# Patient Record
Sex: Male | Born: 1940 | Race: White | Hispanic: No | Marital: Married | Smoking: Former smoker
Health system: Southern US, Community
[De-identification: ages and names within clinical notes are randomized; demographics above are authoritative.]

## PROBLEM LIST (undated history)

## (undated) DIAGNOSIS — K635 Polyp of colon: Secondary | ICD-10-CM

## (undated) DIAGNOSIS — N189 Chronic kidney disease, unspecified: Secondary | ICD-10-CM

## (undated) DIAGNOSIS — N400 Enlarged prostate without lower urinary tract symptoms: Secondary | ICD-10-CM

## (undated) DIAGNOSIS — I2510 Atherosclerotic heart disease of native coronary artery without angina pectoris: Secondary | ICD-10-CM

## (undated) DIAGNOSIS — E785 Hyperlipidemia, unspecified: Secondary | ICD-10-CM

## (undated) DIAGNOSIS — I10 Essential (primary) hypertension: Secondary | ICD-10-CM

## (undated) DIAGNOSIS — T8859XA Other complications of anesthesia, initial encounter: Secondary | ICD-10-CM

## (undated) DIAGNOSIS — T4145XA Adverse effect of unspecified anesthetic, initial encounter: Secondary | ICD-10-CM

## (undated) HISTORY — DX: Hyperlipidemia, unspecified: E78.5

## (undated) HISTORY — DX: Essential (primary) hypertension: I10

## (undated) HISTORY — PX: OTHER SURGICAL HISTORY: SHX169

## (undated) HISTORY — PX: BACK SURGERY: SHX140

## (undated) HISTORY — PX: TONSILLECTOMY: SUR1361

## (undated) HISTORY — DX: Polyp of colon: K63.5

## (undated) HISTORY — PX: HEMORRHOID SURGERY: SHX153

---

## 1998-04-04 ENCOUNTER — Inpatient Hospital Stay (HOSPITAL_COMMUNITY): Admission: RE | Admit: 1998-04-04 | Discharge: 1998-04-06 | Payer: Self-pay | Admitting: Neurosurgery

## 1998-09-03 ENCOUNTER — Ambulatory Visit (HOSPITAL_COMMUNITY): Admission: RE | Admit: 1998-09-03 | Discharge: 1998-09-03 | Payer: Self-pay | Admitting: Neurosurgery

## 2000-01-26 ENCOUNTER — Observation Stay (HOSPITAL_COMMUNITY): Admission: RE | Admit: 2000-01-26 | Discharge: 2000-01-27 | Payer: Self-pay | Admitting: Neurosurgery

## 2000-02-17 ENCOUNTER — Encounter: Admission: RE | Admit: 2000-02-17 | Discharge: 2000-02-17 | Payer: Self-pay | Admitting: Internal Medicine

## 2001-09-05 ENCOUNTER — Encounter: Payer: Self-pay | Admitting: Internal Medicine

## 2001-09-05 ENCOUNTER — Ambulatory Visit (HOSPITAL_COMMUNITY): Admission: RE | Admit: 2001-09-05 | Discharge: 2001-09-05 | Payer: Self-pay | Admitting: Internal Medicine

## 2002-07-25 ENCOUNTER — Emergency Department (HOSPITAL_COMMUNITY): Admission: EM | Admit: 2002-07-25 | Discharge: 2002-07-25 | Payer: Self-pay | Admitting: *Deleted

## 2002-08-04 ENCOUNTER — Ambulatory Visit (HOSPITAL_COMMUNITY): Admission: RE | Admit: 2002-08-04 | Discharge: 2002-08-04 | Payer: Self-pay | Admitting: Internal Medicine

## 2002-08-04 ENCOUNTER — Encounter: Payer: Self-pay | Admitting: Internal Medicine

## 2002-10-19 ENCOUNTER — Observation Stay (HOSPITAL_COMMUNITY): Admission: RE | Admit: 2002-10-19 | Discharge: 2002-10-20 | Payer: Self-pay | Admitting: Neurosurgery

## 2003-05-05 IMAGING — CT CT HEAD W/O CM
1 series · 16 of 28 positions shown, 20 images · non-contrast
Comparison: none

FINDINGS
CLINICAL DATA: HEADACHE; CONCUSSION
CT OF THE HEAD WITHOUT CONTRAST
COMPARING 03/12/01.
CONTIGUOUS AXIAL CT IMAGES WERE OBTAINED FROM THE SKULL BASE TO THE VERTEX.
NO INTRACRANIAL HEMORRHAGE OR ACUTE INTRACRANIAL ABNORMALITY IS IDENTIFIED.  NO HYDROCEPHALUS IS
NOTED.  NO ABNORMAL EXTRAAXIAL FLUID COLLECTION OR MASS EFFECT IS IDENTIFIED.
IMPRESSION
UNREMARKABLE CT APPEARANCE OF THE HEAD.

[Series 880: — · axial · 0.42mm/px · z∈[-620,-495]mm · 16 of 28 slices shown, 20 images]
[im 2/28  brain]
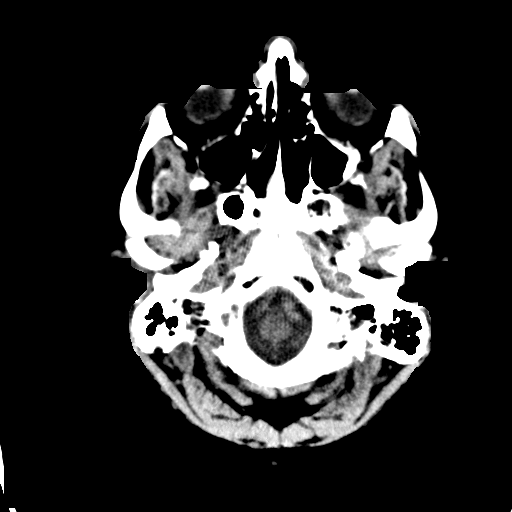
[im 2/28  bone]
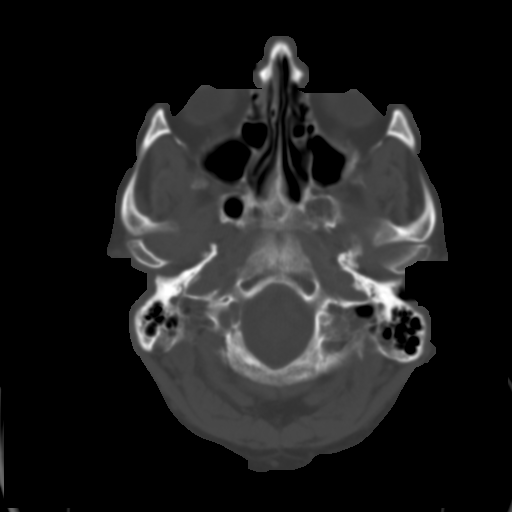
[im 4/28  brain]
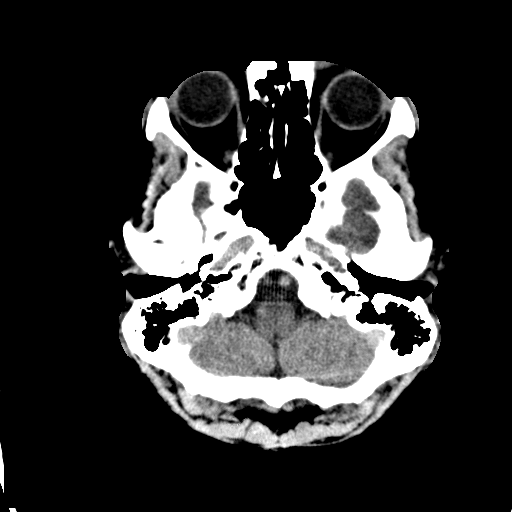
[im 6/28  brain]
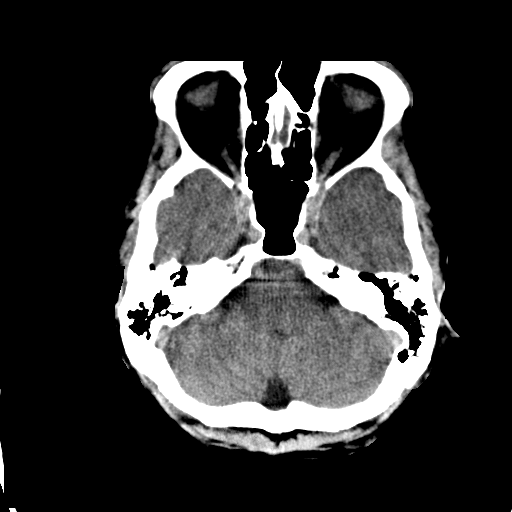
[im 7/28  brain]
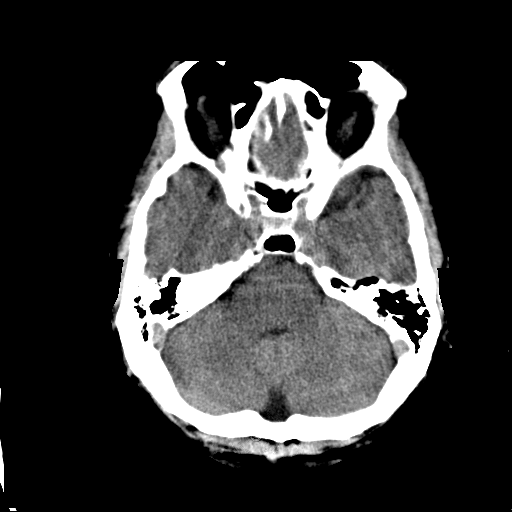
[im 9/28  brain]
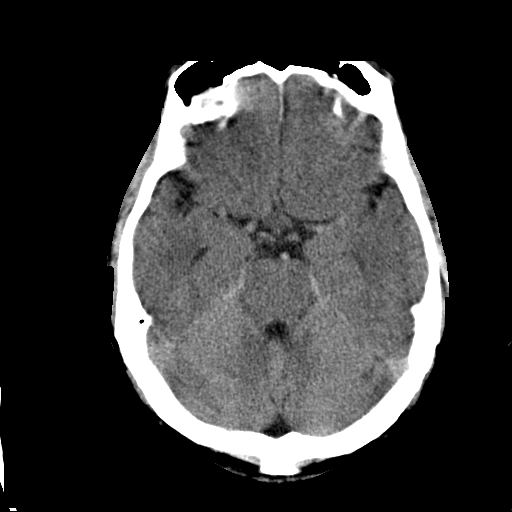
[im 9/28  bone]
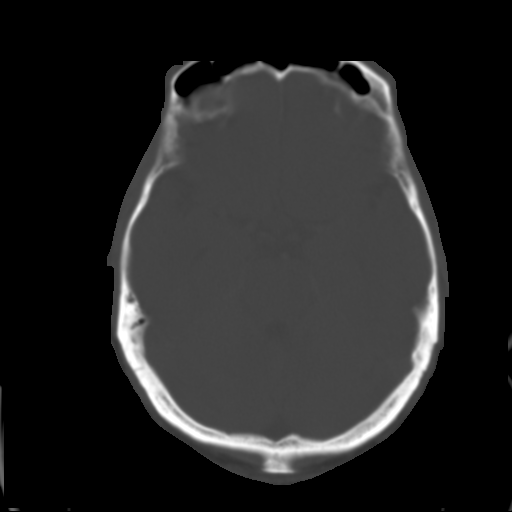
[im 10/28  brain]
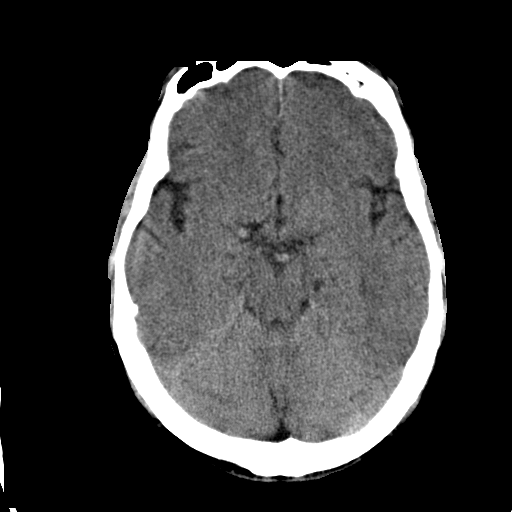
[im 12/28  brain]
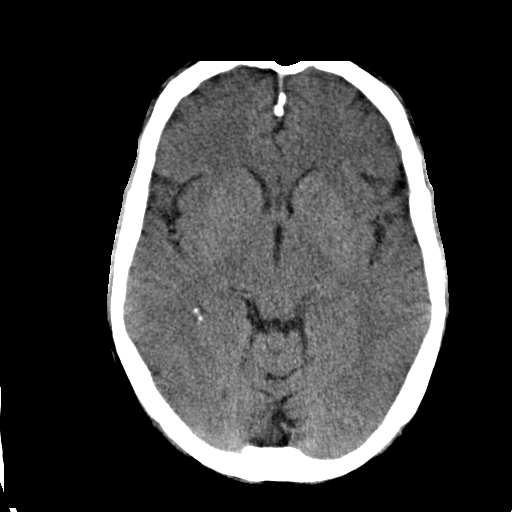
[im 14/28  brain]
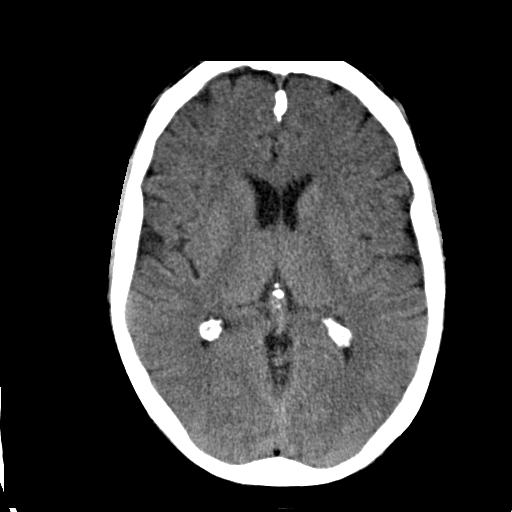
[im 15/28  brain]
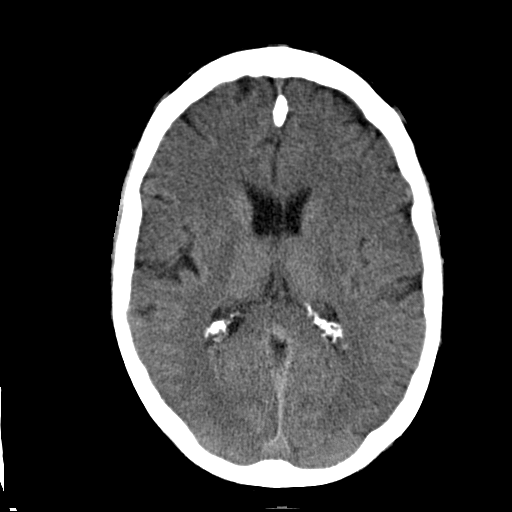
[im 15/28  bone]
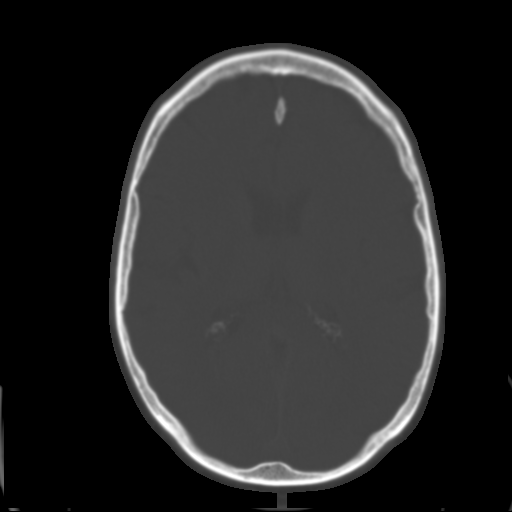
[im 17/28  brain]
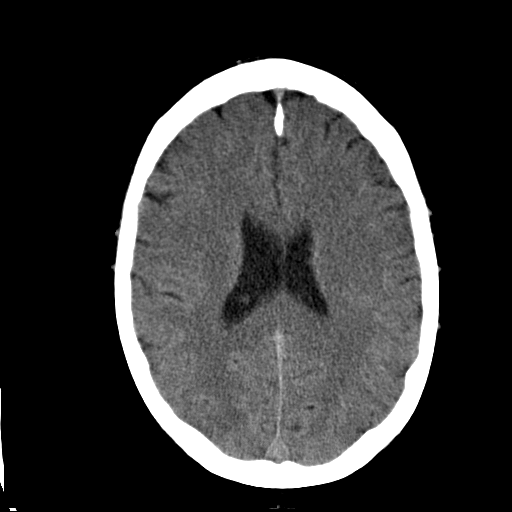
[im 19/28  brain]
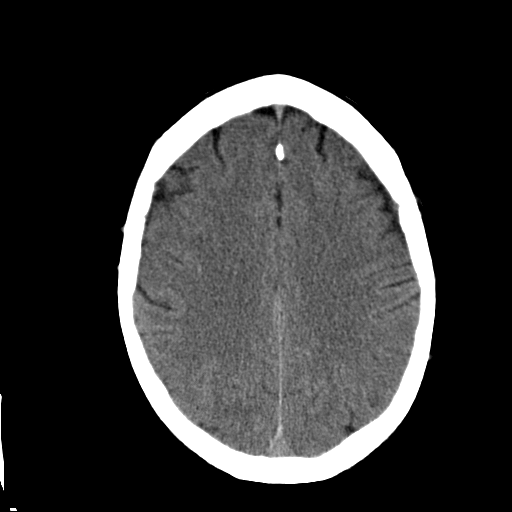
[im 20/28  brain]
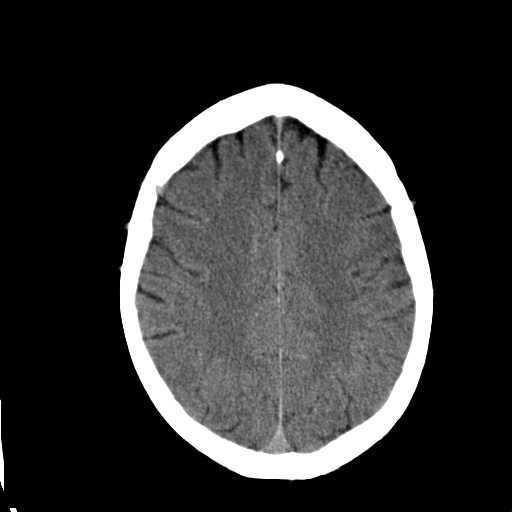
[im 22/28  brain]
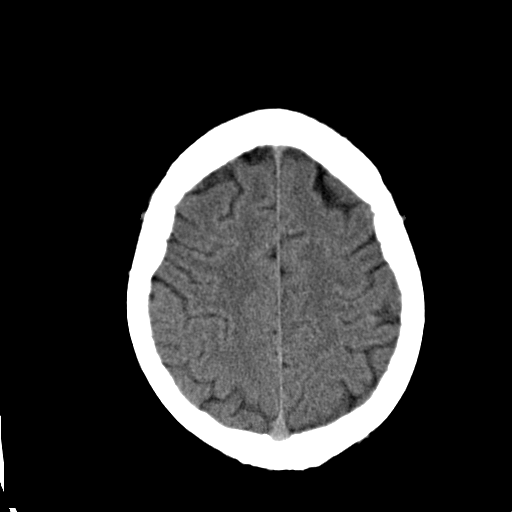
[im 22/28  bone]
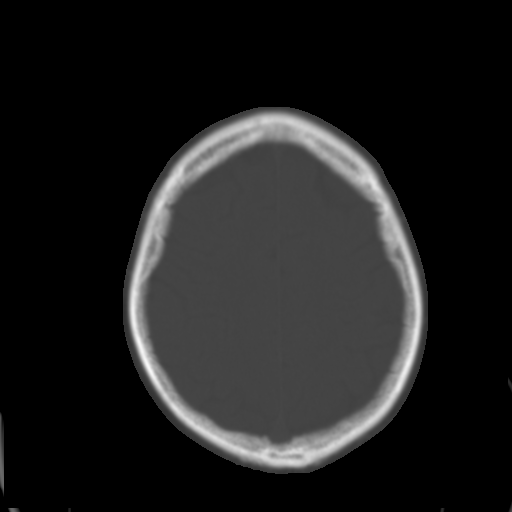
[im 23/28  brain]
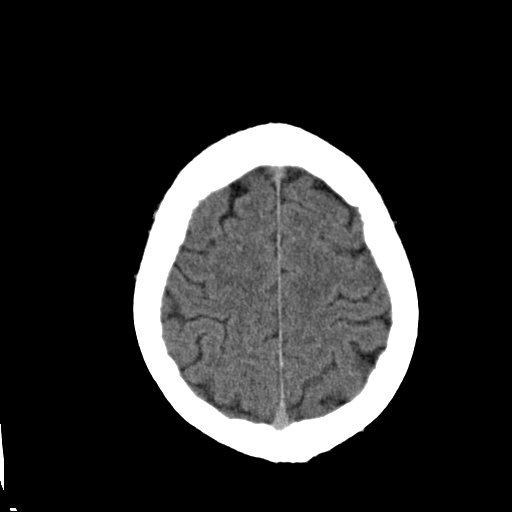
[im 25/28  brain]
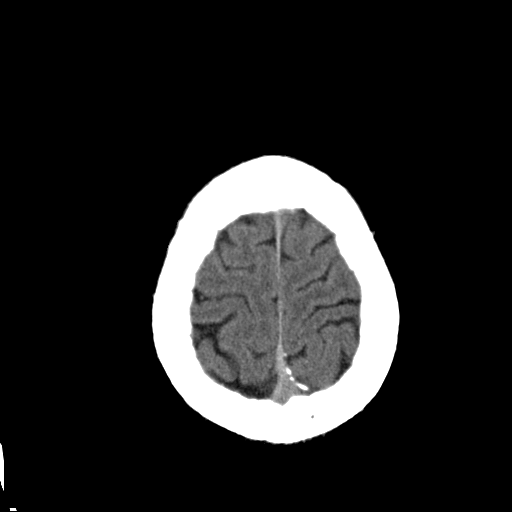
[im 27/28  brain]
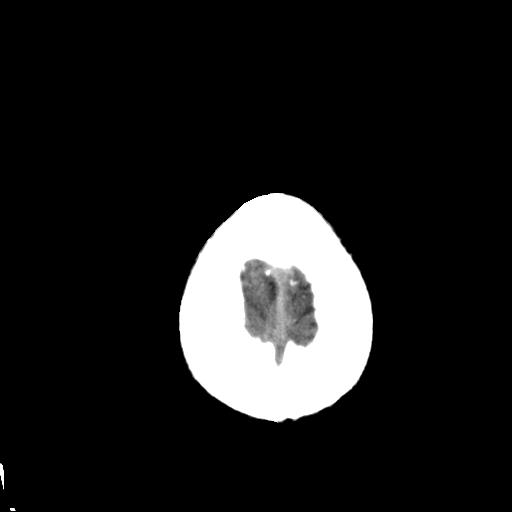

[16 of 28 positions shown; findings below may reference images not displayed]

## 2003-12-29 HISTORY — PX: CORONARY STENT PLACEMENT: SHX1402

## 2003-12-29 HISTORY — PX: CARDIAC CATHETERIZATION: SHX172

## 2004-04-25 ENCOUNTER — Ambulatory Visit (HOSPITAL_COMMUNITY): Admission: RE | Admit: 2004-04-25 | Discharge: 2004-04-25 | Payer: Self-pay | Admitting: Internal Medicine

## 2004-09-02 ENCOUNTER — Inpatient Hospital Stay (HOSPITAL_COMMUNITY): Admission: EM | Admit: 2004-09-02 | Discharge: 2004-09-04 | Payer: Self-pay | Admitting: Emergency Medicine

## 2005-11-06 ENCOUNTER — Ambulatory Visit (HOSPITAL_COMMUNITY): Admission: RE | Admit: 2005-11-06 | Discharge: 2005-11-06 | Payer: Self-pay | Admitting: Neurosurgery

## 2006-01-11 ENCOUNTER — Inpatient Hospital Stay (HOSPITAL_COMMUNITY): Admission: RE | Admit: 2006-01-11 | Discharge: 2006-01-13 | Payer: Self-pay | Admitting: Neurosurgery

## 2007-01-29 ENCOUNTER — Emergency Department (HOSPITAL_COMMUNITY): Admission: EM | Admit: 2007-01-29 | Discharge: 2007-01-29 | Payer: Self-pay | Admitting: Emergency Medicine

## 2008-06-13 ENCOUNTER — Emergency Department (HOSPITAL_COMMUNITY): Admission: EM | Admit: 2008-06-13 | Discharge: 2008-06-13 | Payer: Self-pay | Admitting: Emergency Medicine

## 2008-06-25 ENCOUNTER — Ambulatory Visit: Payer: Self-pay | Admitting: Orthopedic Surgery

## 2008-06-25 DIAGNOSIS — M545 Low back pain, unspecified: Secondary | ICD-10-CM | POA: Insufficient documentation

## 2008-06-25 DIAGNOSIS — M5380 Other specified dorsopathies, site unspecified: Secondary | ICD-10-CM | POA: Insufficient documentation

## 2008-06-25 DIAGNOSIS — M5430 Sciatica, unspecified side: Secondary | ICD-10-CM

## 2008-06-25 DIAGNOSIS — M5126 Other intervertebral disc displacement, lumbar region: Secondary | ICD-10-CM

## 2008-06-26 ENCOUNTER — Telehealth: Payer: Self-pay | Admitting: Orthopedic Surgery

## 2008-07-06 ENCOUNTER — Encounter: Payer: Self-pay | Admitting: Orthopedic Surgery

## 2008-07-09 ENCOUNTER — Telehealth: Payer: Self-pay | Admitting: Orthopedic Surgery

## 2008-07-10 ENCOUNTER — Telehealth: Payer: Self-pay | Admitting: Orthopedic Surgery

## 2008-07-11 ENCOUNTER — Encounter: Payer: Self-pay | Admitting: Orthopedic Surgery

## 2008-07-16 ENCOUNTER — Ambulatory Visit: Payer: Self-pay | Admitting: Orthopedic Surgery

## 2008-07-16 ENCOUNTER — Telehealth: Payer: Self-pay | Admitting: Orthopedic Surgery

## 2008-07-16 DIAGNOSIS — S22009A Unspecified fracture of unspecified thoracic vertebra, initial encounter for closed fracture: Secondary | ICD-10-CM | POA: Insufficient documentation

## 2008-07-17 ENCOUNTER — Encounter: Payer: Self-pay | Admitting: Orthopedic Surgery

## 2008-07-25 ENCOUNTER — Telehealth: Payer: Self-pay | Admitting: Orthopedic Surgery

## 2008-08-17 ENCOUNTER — Encounter: Payer: Self-pay | Admitting: Orthopedic Surgery

## 2008-08-30 ENCOUNTER — Encounter: Payer: Self-pay | Admitting: Orthopedic Surgery

## 2008-10-03 ENCOUNTER — Encounter: Payer: Self-pay | Admitting: Orthopedic Surgery

## 2008-10-17 ENCOUNTER — Ambulatory Visit (HOSPITAL_COMMUNITY): Admission: RE | Admit: 2008-10-17 | Discharge: 2008-10-17 | Payer: Self-pay | Admitting: Neurosurgery

## 2008-10-17 ENCOUNTER — Encounter (INDEPENDENT_AMBULATORY_CARE_PROVIDER_SITE_OTHER): Payer: Self-pay | Admitting: Neurosurgery

## 2009-01-30 ENCOUNTER — Ambulatory Visit (HOSPITAL_COMMUNITY): Admission: RE | Admit: 2009-01-30 | Discharge: 2009-01-31 | Payer: Self-pay | Admitting: Neurosurgery

## 2009-06-05 ENCOUNTER — Emergency Department (HOSPITAL_COMMUNITY): Admission: EM | Admit: 2009-06-05 | Discharge: 2009-06-05 | Payer: Self-pay | Admitting: Emergency Medicine

## 2009-07-18 IMAGING — RF DG THORACIC SPINE 2V
1 series · 3 of 3 positions shown · non-contrast
Comparison: None.

CLINICAL DATA: Back pain

THORACIC SPINE - 2 VIEW

[Series 1: run · 3 of 3 slices shown]
[im 1/3]
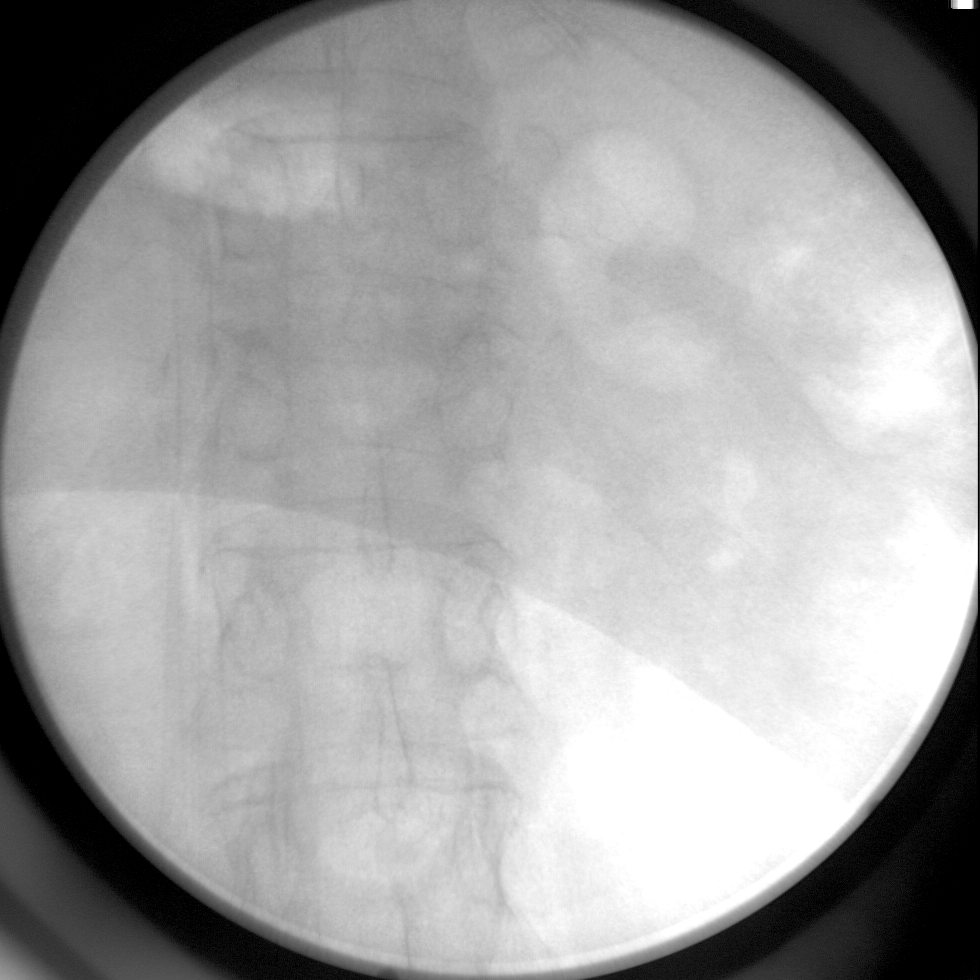
[im 2/3]
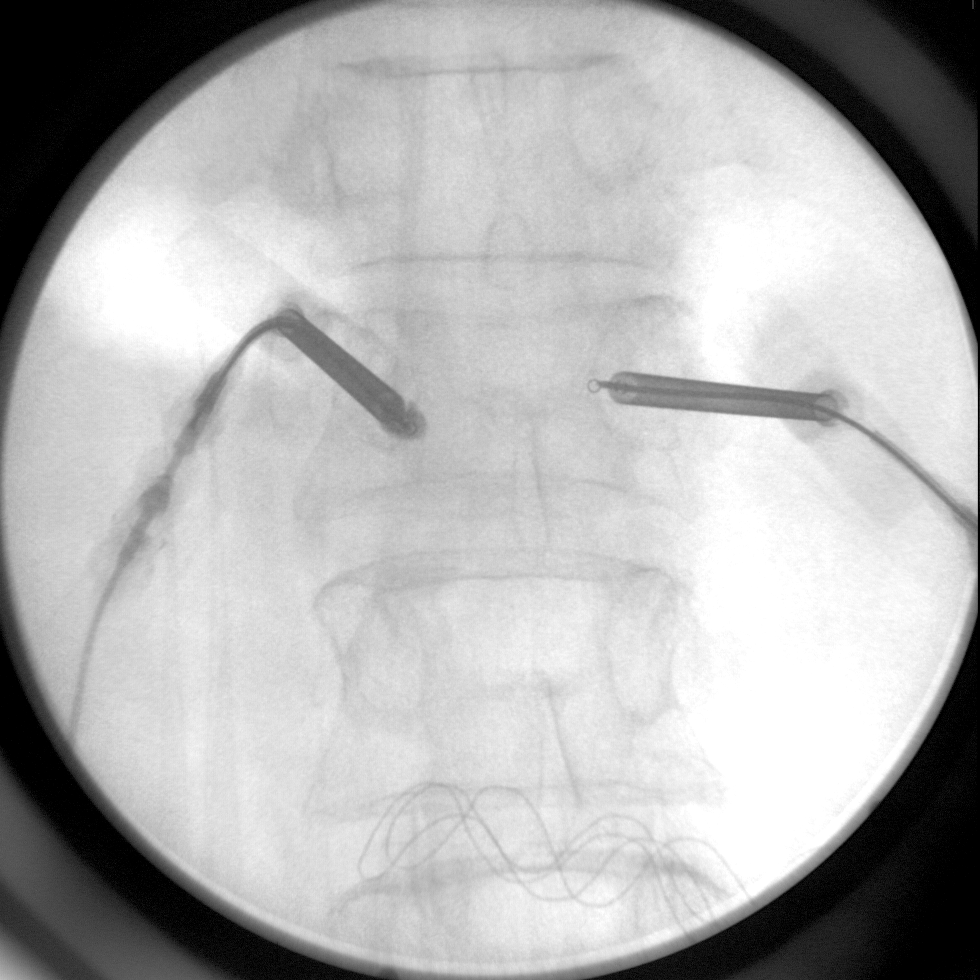
[im 3/3]
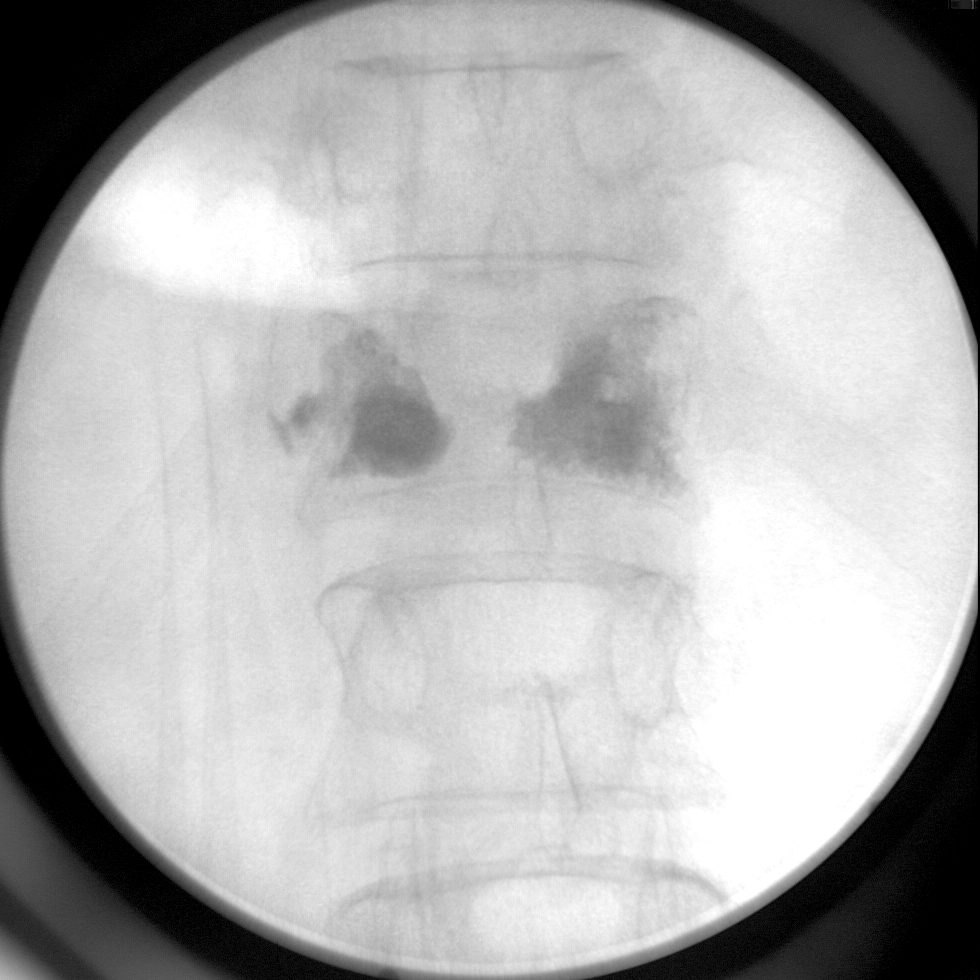

[3 of 3 positions shown; findings below may reference images not displayed]

FINDINGS: AP and lateral C-arm films demonstrate mild compression
of T12 vertebrae.
IMPRESSION: As above

## 2010-06-23 ENCOUNTER — Ambulatory Visit: Payer: Self-pay | Admitting: Urgent Care

## 2010-06-23 DIAGNOSIS — R110 Nausea: Secondary | ICD-10-CM

## 2010-06-23 DIAGNOSIS — R634 Abnormal weight loss: Secondary | ICD-10-CM

## 2010-06-23 DIAGNOSIS — K219 Gastro-esophageal reflux disease without esophagitis: Secondary | ICD-10-CM | POA: Insufficient documentation

## 2010-06-24 ENCOUNTER — Encounter: Payer: Self-pay | Admitting: Gastroenterology

## 2010-07-03 ENCOUNTER — Encounter: Payer: Self-pay | Admitting: Internal Medicine

## 2010-11-04 ENCOUNTER — Ambulatory Visit: Payer: Self-pay | Admitting: Internal Medicine

## 2010-11-27 ENCOUNTER — Ambulatory Visit: Payer: Self-pay | Admitting: Internal Medicine

## 2010-11-27 ENCOUNTER — Ambulatory Visit (HOSPITAL_COMMUNITY)
Admission: RE | Admit: 2010-11-27 | Discharge: 2010-11-27 | Payer: Self-pay | Source: Home / Self Care | Admitting: Internal Medicine

## 2010-12-05 ENCOUNTER — Ambulatory Visit (HOSPITAL_COMMUNITY)
Admission: RE | Admit: 2010-12-05 | Discharge: 2010-12-05 | Payer: Self-pay | Source: Home / Self Care | Attending: Internal Medicine | Admitting: Internal Medicine

## 2010-12-18 ENCOUNTER — Ambulatory Visit (HOSPITAL_COMMUNITY)
Admission: RE | Admit: 2010-12-18 | Discharge: 2010-12-18 | Payer: Self-pay | Source: Home / Self Care | Attending: Internal Medicine | Admitting: Internal Medicine

## 2011-01-25 LAB — CONVERTED CEMR LAB
BUN: 14 mg/dL (ref 6–23)
Creatinine, Ser: 0.97 mg/dL (ref 0.40–1.50)

## 2011-01-29 NOTE — Letter (Signed)
Summary: Internal Other/EGD/TCS ORDER  Internal Other/EGD/TCS ORDER   Imported By: Cloria Spring LPN 56/21/3086 57:84:69  _____________________________________________________________________  External Attachment:    Type:   Image     Comment:   External Document

## 2011-01-29 NOTE — Letter (Signed)
Summary: referral from dr fusco  referral from dr fusco   Imported By: Rosine Beat 07/03/2010 14:49:38  _____________________________________________________________________  External Attachment:    Type:   Image     Comment:   External Document

## 2011-01-29 NOTE — Assessment & Plan Note (Signed)
Summary: abd discomfort,nausea,consult for tcs/ss   Referring Provider:  Dr Sherwood Gambler Primary Care Provider:  Dr Sherwood Gambler  Chief Complaint:  not digesting certain foods and N/V.  History of Present Illness: 70 y/o caucasian male w/ chronic nausea and vomiting x 5 months.  c/o "wanting to belch".   Was taking TUMS round the clock.   Taking OTC prilosec 20mg  two times a day x 5 months.  This has hellped some.  Can't eat meats or spicy foods.  Heartburn & indigestion resolved w prilosec.  Denies odynophagia and dysphagia.  Weight loss 30# in 5 months.  ASA 81mg  daily & plavix daily.  Percocet as needed chronic leg/elbow pain neuropathy.  BM 2-3/day, now w/ once q couple of days,  denies rectal bleeding or melena.  "Not eating due to fear of feeling sick."  On Plavix and ASA daily w/ hx heart stent.  Current Problems (verified): 1)  Gerd  (ICD-530.81) 2)  Nausea, Chronic  (ICD-787.02) 3)  Vertebral Fracture, Thoracic Spine  (ICD-805.2) 4)  H N P-lumbar  (ICD-722.10) 5)  Sciatica  (ICD-724.3) 6)  Lumbar Spasm  (ICD-724.8) 7)  Low Back Pain  (ICD-724.2) 8)  Family History Coronary Heart Disease Male < 65  (ICD-V17.3) 9)  Family History of Diabetes  (ICD-V18.0)  Current Medications (verified): 1)  Baby Aspirin 81 Mg  Chew (Aspirin) 2)  Plavix 75 Mg  Tabs (Clopidogrel Bisulfate) 3)  Percocet 10/325 Mg  Tabs (Oxycodone-Acetaminophen) .Marland Kitchen.. 1 By Mouth Q 4 Hrs 4)  Prilosec Otc 20 Mg Tbec (Omeprazole Magnesium) .... Two Times A Day  Allergies (verified): 1)  ! Paxil  Past History:  Past Medical History: stent for heart CAD neuropathy colonoscopy 1993-normal GERD  Past Surgical History: 2 ACDF (neck) hemorrhoidectomy 2 Lspine procedures 6 back surgeries tonsillectomy right ear surgery  Family History: Family History of Diabetes Family History Coronary Heart Disease male < 65 No known family history of colorectal carcinoma, IBD, liver or chronic GI problems.  Social  History: Patient is married. 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 12 PKYR Alcohol Use - yes, shot tequila once yr Illicit Drug Use - no Patient gets regular exercise. walks Does Patient Exercise:  yes Smoking Status:  quit Drug Use:  no  Review of Systems General:  Denies fever, chills, sweats, anorexia, fatigue, weakness, malaise, and sleep disorder. CV:  Denies chest pains, angina, palpitations, syncope, dyspnea on exertion, orthopnea, PND, peripheral edema, and claudication. Resp:  Denies dyspnea at rest, dyspnea with exercise, cough, sputum, wheezing, coughing up blood, and pleurisy. GI:  Denies jaundice and fecal incontinence. GU:  Denies urinary burning, blood in urine, urinary frequency, urinary hesitancy, nocturnal urination, and urinary incontinence. MS:  Complains of leg pain at night and leg pain with exertion; denies joint pain / LOM, joint swelling, joint stiffness, joint deformity, low back pain, muscle weakness, muscle cramps, muscle atrophy, and shoulder pain / LOM hand / wrist pain (CTS). Derm:  Denies rash, itching, dry skin, hives, moles, warts, and unhealing ulcers. Psych:  Denies depression, anxiety, memory loss, suicidal ideation, hallucinations, paranoia, phobia, and confusion. Heme:  Complains of bruising; denies bleeding and enlarged lymph nodes.  Vital Signs:  Patient profile:   70 year old male Height:      74 inches Weight:      192 pounds BMI:     24.74 Temp:     97.8 degrees F oral Pulse rate:   60 / minute BP sitting:  112 / 72  (left arm) Cuff size:   regular  Vitals Entered By: Hendricks Limes LPN (June 23, 2010 2:59 PM)  Physical Exam  General:  Well developed, well nourished, no acute distress. Head:  Normocephalic and atraumatic. Eyes:  Sclera clear, no icterus. Ears:  Normal auditory acuity. Nose:  No deformity, discharge,  or lesions. Mouth:  No deformity or lesions, dentition normal. Neck:   Supple; no masses or thyromegaly. Lungs:  Clear throughout to auscultation. Heart:  Regular rate and rhythm; no murmurs, rubs,  or bruits. Abdomen:  Soft, nontender and nondistended. No masses, hepatosplenomegaly or hernias noted. Normal bowel sounds.without guarding and without rebound.   Msk:  Symmetrical with no gross deformities. Normal posture. Pulses:  Normal pulses noted. Extremities:  No clubbing, cyanosis, edema or deformities noted. Neurologic:  Alert and  oriented x4;  grossly normal neurologically. Skin:  Intact without significant lesions or rashes. Cervical Nodes:  No significant cervical adenopathy. Psych:  Alert and cooperative. Normal mood and affect.  Impression & Recommendations:  Problem # 1:  NAUSEA, CHRONIC (ICD-787.02) 70 y/o caucasian male w/ 5 month hx significant weight loss, chronic nausea, & GERD.  Pt on plavix/ASA w/ hx CAD s/p cardiac stent.  Differentials include PUD, occult malignancy, gastroparesis, refractory GERD, or gastritis.  Diagnostic EGD and colonoscopy to be performed by Dr. Jonette Eva in the near future.  I have discussed risks and benefits which include, but are not limited to, bleeding, infection, perforation, or medication reaction.  The patient agrees with this plan and consent will be obtained.  Will discuss plavix/ASA mgmt prior to procedure w/ Dr Darrick Penna.  Orders: Consultation Level III (66440)  Problem # 2:  WEIGHT LOSS, ABNORMAL (ICD-783.21) See #1  Problem # 3:  GERD (ICD-530.81) See #1  Appended Document: abd discomfort,nausea,consult for tcs/ss Stent placed by Dr Allyson Sabal approx 6 yrs ago   Appended Document: abd discomfort,nausea,consult for tcs/ss Hold Plavix for 5 days pirio to procedure. Continue ASA.  Appended Document: abd discomfort,nausea,consult for tcs/ss Pt informed.  Appended Document: abd discomfort,nausea,consult for tcs/ss Pt's wife called to cancell procedure. Pt will [facility] when he gets back in town. Pt has 2  MRN.

## 2011-02-27 ENCOUNTER — Other Ambulatory Visit (HOSPITAL_COMMUNITY): Payer: Self-pay | Admitting: Family Medicine

## 2011-02-27 DIAGNOSIS — R1110 Vomiting, unspecified: Secondary | ICD-10-CM

## 2011-02-27 DIAGNOSIS — R1011 Right upper quadrant pain: Secondary | ICD-10-CM

## 2011-03-04 ENCOUNTER — Encounter (HOSPITAL_COMMUNITY): Payer: Self-pay

## 2011-03-04 ENCOUNTER — Encounter (HOSPITAL_COMMUNITY)
Admission: RE | Admit: 2011-03-04 | Discharge: 2011-03-04 | Disposition: A | Discharge status: Home / Self Care | Payer: Medicare Other | Source: Ambulatory Visit | Attending: Family Medicine | Admitting: Family Medicine

## 2011-03-04 DIAGNOSIS — R1011 Right upper quadrant pain: Secondary | ICD-10-CM | POA: Insufficient documentation

## 2011-03-04 DIAGNOSIS — R112 Nausea with vomiting, unspecified: Secondary | ICD-10-CM | POA: Insufficient documentation

## 2011-03-04 DIAGNOSIS — R1110 Vomiting, unspecified: Secondary | ICD-10-CM

## 2011-03-04 MED ORDER — TECHNETIUM TC 99M MEBROFENIN IV KIT
5.0000 | PACK | Freq: Once | INTRAVENOUS | Status: AC | PRN
  Administered 2011-03-04: 5.5 via INTRAVENOUS

## 2011-03-10 LAB — IRON AND TIBC
Iron: 54 ug/dL (ref 42–135)
Saturation Ratios: 20 % (ref 20–55)
TIBC: 266 ug/dL (ref 215–435)
UIBC: 212 ug/dL

## 2011-03-10 LAB — FOLATE RBC: RBC Folate: 875 ng/mL — ABNORMAL HIGH (ref 180–600)

## 2011-03-10 LAB — CBC
HCT: 34.8 % — ABNORMAL LOW (ref 39.0–52.0)
Hemoglobin: 11.9 g/dL — ABNORMAL LOW (ref 13.0–17.0)
MCH: 30.7 pg (ref 26.0–34.0)
MCHC: 34.2 g/dL (ref 30.0–36.0)
MCV: 89.7 fL (ref 78.0–100.0)
Platelets: 121 10*3/uL — ABNORMAL LOW (ref 150–400)
RBC: 3.88 MIL/uL — ABNORMAL LOW (ref 4.22–5.81)
RDW: 14.2 % (ref 11.5–15.5)
WBC: 6 10*3/uL (ref 4.0–10.5)

## 2011-03-10 LAB — VITAMIN B12: Vitamin B-12: 172 pg/mL — ABNORMAL LOW (ref 211–911)

## 2011-03-10 LAB — FERRITIN: Ferritin: 226 ng/mL (ref 22–322)

## 2011-03-10 LAB — LIPID PANEL: Cholesterol: 186 mg/dL (ref 0–200)

## 2011-03-19 ENCOUNTER — Ambulatory Visit (INDEPENDENT_AMBULATORY_CARE_PROVIDER_SITE_OTHER): Payer: Self-pay | Admitting: Internal Medicine

## 2011-04-14 LAB — BASIC METABOLIC PANEL
CO2: 27 mEq/L (ref 19–32)
Glucose, Bld: 90 mg/dL (ref 70–99)
Potassium: 4.2 mEq/L (ref 3.5–5.1)
Sodium: 139 mEq/L (ref 135–145)

## 2011-04-14 LAB — CBC
HCT: 37.7 % — ABNORMAL LOW (ref 39.0–52.0)
Hemoglobin: 13.3 g/dL (ref 13.0–17.0)
MCHC: 35.3 g/dL (ref 30.0–36.0)
RDW: 13.9 % (ref 11.5–15.5)

## 2011-05-12 NOTE — Consult Note (Signed)
Travis Woodward, Travis Woodward                 ACCOUNT NO.:  [id number]   MEDICAL RECORD NO.:  [id number]          PATIENT TYPE:  OIB   LOCATION:  3533                         FACILITY:  [facility]   PHYSICIAN:  Mark C. Vernie Ammons, M.D.  DATE OF BIRTH:  1941-08-07   DATE OF CONSULTATION:  01/31/2009  DATE OF DISCHARGE:  01/31/2009                                 CONSULTATION   Travis Woodward is a very pleasant 70 year old white male patient seen in  hospital consultation for further evaluation of acute urinary retention  after spinal surgery.  The patient reports that he has a longstanding  history of enlarged prostate and has undergone 6 surgeries in the past.  The first surgery he had back in 1999 resulted in urinary retention and  this has occurred on another occasion previously.  He has been treated  with Flomax and the shorter surgeries did not often times cause urinary  retention, but if he had a longer surgery, this would tend to occur.  He  has been seen and evaluated in the past by Dr. Wanda Plump and apparently,  has undergone a prostate biopsy that was negative.  He reports Dr. Sherwood Gambler  has performed a recent VRE and PSA and found these to be normal.   He came into the hospital for an elective back surgery and was unable to  urinate after the surgery.  A Foley catheter was inserted and remains  indwelling at this time.  I was consulted regarding further treatment  and care.  He reports that he has really no voiding symptoms as a  baseline and specifically denies significant obstructive symptoms as  well as lack of nocturia.  He has no prior history of prostatitis.  His  urinary retention would be considered of moderate severity and there are  no modifying factors.   Past medical history is positive for:  1. Coronary artery disease.  2. BPH.  3. History of tobacco use.   SURGICAL HISTORY:  He has had a drug eluting stent placed in 2005 and  had several back surgeries and orthopedic procedures.   MEDICATIONS ON ADMISSION:  1. Plavix 75 mg.  2. Aspirin.  3. Prilosec.  4. Lexapro.  5. Percocet.   ALLERGIES:  He has an intolerance to STATINS.   SOCIAL HISTORY:  He currently does not smoke and denies drinking.   FAMILY HISTORY:  Negative for GU malignancy or renal disease.   REVIEW OF SYSTEMS:  Positive for back pain from his recent surgery and  has some slight discomfort from the Foley catheter, otherwise his 13-  point review of systems is negative.   PHYSICAL EXAMINATION:  VITAL SIGNS:  Temperature is 98.7, blood pressure  is 168/95, pulse 73, and respiration 18.  GENERAL:  The patient is well-developed and well-nourished white male,  in no apparent distress.  HEENT:  Atraumatic, normocephalic.  Oropharynx is clear.  NECK:  Supple with midline trachea.  CHEST:  Normal respiratory effort.  CARDIOVASCULAR:  Regular rate and rhythm.  ABDOMEN:  Soft and nontender without mass or hepatosplenomegaly.  He has  no inguinal  hernias or adenopathy.  GU:  He has normal phallus and glans meatus with Foley catheter  indwelling draining clear urine.  Scrotum, testicles, epididymis, anus,  and perineum are normal.  He has normal sphincter tone.  His prostate is  2-3+ enlarged, but smooth and symmetric and free of suspicious  nodularity, induration, and has no seminal vesicle abnormality palpable.  There is no rectal mass present.  EXTREMITIES:  Without clubbing, cyanosis, or edema.  SKIN:  Warm and dry with an intact dressing in the midline lumbar region  of his back.  NEUROLOGICALLY:  He has no gross focal neurologic deficits.   LABORATORY RESULTS:  Hemoglobin is 13.3, hematocrit 37.7.  His  creatinine is 0.5 with a normal calcium of 9.0.   IMPRESSION:  1. Urinary retention after recent lumbar back surgery.  This is      undoubtedly secondary to his benign prostatic hypertrophy.  This      has occurred in the past and has shown a pattern of rapid      resolution with the aid of  alpha-blockade therapy.  2. He has benign prostatic hypertrophy, but really no obstructive      voiding symptoms as a baseline, however, he probably would benefit      from a 5-alpha-reductase inhibitor in the future.  3. He has a history of left lower pole renal cyst. Noted on previous      CT scan in 2005.  4. He has had a prior history of prostate biopsy that was negative.   PLAN:  1. He is ready for discharge other than the fact that he currently has      a Foley catheter indwelling.  He has taken Foley catheters out at      home in the past and therefore, I feel it is safe to discharge him      with a Foley catheter indwelling for voiding trial in the morning.  2. He will receive Flomax 0.4 mg prior to discharge.  3. A Foley catheter will be left indwelling and it will be removed      first thing in the morning for voiding trial.  4. If he has any difficulty with voiding, he will return to my office      tomorrow afternoon for replacement of a Foley catheter.  Otherwise,      if he voids without difficulty, he will remain on Flomax as an      outpatient for 3 more days.  He will return to my office in 1 week      to discuss further treatment of his BPH and likely initiation of 5-      alpha-reductase inhibitor therapy.      Mark C. Vernie Ammons, M.D.  Electronically Signed     MCO/MEDQ  D:  01/31/2009  T:  02/01/2009  Job:  [id number]   cc:   Cristi Loron, M.D.

## 2011-05-12 NOTE — Op Note (Signed)
[name], Travis Woodward                 ACCOUNT NO.:  [id number]   MEDICAL RECORD NO.:  [id number]          PATIENT TYPE:  OIB   LOCATION:  3533                         FACILITY:  [facility]   PHYSICIAN:  Cristi Loron, M.D.DATE OF BIRTH:  08/20/1941   DATE OF PROCEDURE:  01/30/2009  DATE OF DISCHARGE:                               OPERATIVE REPORT   BRIEF HISTORY:  The patient is a 70 year old white male who suffered  from back and left leg pain consistent with an L4 radiculopathy.  He  failed medical management, was worked up with a lumbar MRI, which  demonstrated he had a left L4-5 far lateral disk herniation and  compression of the exiting L4 nerve root.  I discussed the various  treatment options with the patient including surgery.  He has weighed  the risks, benefits, and alternatives of the surgery and decided to  proceed with a left L4-5 far lateral diskectomy.   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   PROCEDURE:  Left L4-5 far lateral diskectomy using microdissection.   SURGEON:  Cristi Loron, MD   ASSISTANT:  Hilda Lias, MD   ANESTHESIA:  General endotracheal.   ESTIMATED BLOOD LOSS:  50 mL.   SPECIMENS:  None.   DRAINS:  None.   COMPLICATIONS:  None.   DESCRIPTION OF PROCEDURE:  The patient was brought to the operating room  by the anesthesia team.  General endotracheal anesthesia was induced.  The patient was turned to the prone position on the Wilson frame.  His  lumbosacral region was prepared with Betadine scrub and Betadine  solution and sterile drapes were applied.  I then injected the area to  be incised with Marcaine with epinephrine solution.  I used a scalpel to  make a linear midline incision over the L4-5 interspace through the  patient's previous  surgical scar.  I used electrocautery to perform a  left-sided subperiosteal dissection exposing the left spinous process,  lamina of L4 and L5.  We obtained intraoperative radiograph to confirm  our location and then inserted the [facility] retractor for exposure.   We then brought the operative microscope into the field and under its  magnification and illumination we completed the  microdissection/decompression.  We used a high-speed drill to remove the  lateral aspect of the L4 pars region.  Then using microdissection to  dissect through the intertransverse ligament and muscle, we identified  the exiting L4 nerve root.  We used microdissection to free up the nerve  root from the soft tissues and then we dissected just caudal to the  nerve root and we identified the L4-5 far lateral disk.  There was a  disk herniation as expected more or less in the L4-5 foramen.  We  removed this in multiple fragments using the pituitary forceps.  We did  perform a partial intervertebral diskectomy.  There were some more  lateral spondylosis at the disk space, which we  removed using the  osteophyte tool.  At this point, we had good decompression of the nerve  roots.  We were able to palpate along the ventral surface of the nerve  root throughout the neural foramen out of the soft tissues.  It was  noted the nerve root was well decompressed.  We then obtained hemostasis  using bipolar cautery.  We irrigated the wound out with bacitracin  solution.  We then removed the retractor and then reapproximated the  patient's thoracolumbar fascia with interrupted #1 Vicryl suture,  subcutaneous tissue with interrupted 2-0 Vicryl suture, and skin with  Steri-Strips and Benzoin.  The wound was then coated with bacitracin  ointment, sterile dressing was applied.  The drapes were removed.  The  patient was subsequently returned to supine position where he was  extubated by the anesthesia team and transported to post  anesthesia care  unit in stable condition.  All sponge, instrument, and needle counts  were correct at the end of the case.      Cristi Loron, M.D.  Electronically Signed     JDJ/MEDQ  D:  01/30/2009  T:  01/31/2009  Job:  [id number]

## 2011-05-12 NOTE — Op Note (Signed)
Travis Woodward, Travis Woodward                 ACCOUNT NO.:  [id number]   MEDICAL RECORD NO.:  [id number]          PATIENT TYPE:  OIB   LOCATION:  3524                         FACILITY:  [facility]   PHYSICIAN:  Cristi Loron, M.D.DATE OF BIRTH:  11-21-1941   DATE OF PROCEDURE:  10/17/2008  DATE OF DISCHARGE:  10/17/2008                               OPERATIVE REPORT   BRIEF HISTORY:  The patient is a 70 year old white male who suffered a  T12 compression fracture in a work-related injury.  He failed medical  management.  He was worked up with lumbar x-rays.  We discussed various  treatment options with the patient including a T12 kyphoplasty.  The  patient has weighed the risks, benefits, and alternatives of surgery and  decided to proceed with the T12 kyphoplasty.   PREOPERATIVE DIAGNOSIS:  T12 compression fracture.   POSTOPERATIVE DIAGNOSIS:  T12 compression fracture.   PROCEDURE:  T12 kyphoplasty and bone biopsy.   SURGEON:  Cristi Loron, MD   ASSISTANT:  None.   ANESTHESIA:  General endotracheal.   ESTIMATED BLOOD LOSS:  Minimal.   SPECIMENS:  T12 vertebral body biopsy.   DRAINS:  None.   COMPLICATIONS:  None.   DESCRIPTION OF PROCEDURE:  The patient was brought to the operating room  by the anesthesia team.  General endotracheal anesthesia was induced.  The patient was carefully turned to the prone position on the chest  rolls.  His thoracolumbar region was then prepared with Betadine scrub  and Betadine solution.  Sterile drapes were applied.  We injected the  area to be incised with Marcaine with epinephrine solution.  Under both  the AP and lateral fluoroscopy, we made an incision bilaterally over the  T12 pedicles.  We then cannulated the bilateral T12 pedicles under  biplanar fluoroscopy.  We then biopsied the right T12 vertebral body.  The specimen of the pathology appeared grossly normal.  We then inserted  the balloons, blew up the balloons.  The balloon on the  right did burst.  We then aspirated out the contrast material through the catheter.  We  then filled the bone with some voids bilaterally with the bone cement  under AP and lateral fluoroscopy.  On the left side, there was a bit of  lateral vertebral body breach with small amount of the contrast going to  soft tissues.  We waited a minute or two and allowed the bone cement to  harden and continued to fill the vertebral body without any further  malplacement of the bone cement.  After we satisfied with the bone  filling, we removed the cannula.  We then reapproximated patient's  subcutaneous tissue with interrupted 3-0 Vicryl suture, the skin with  Steri-Strips and Benzoin.  The wound was then coated with  bacitracin ointment.  Sterile dressing applied.  The drapes were  removed.  The patient was subsequently returned to supine position and  extubated and transported to the post anesthesia care unit in stable  condition.  All sponge, instrument, and needle counts were correct at  the end of the case.  Cristi Loron, M.D.  Electronically Signed     JDJ/MEDQ  D:  10/17/2008  T:  10/18/2008  Job:  [id number]

## 2011-05-15 NOTE — Op Note (Signed)
. [facility]  Patient:    Travis Woodward                        MRN: [id number] Proc. Date: 01/26/00 Adm. Date:  [id number] Attending:  Tressie Stalker D Dictator:   Cristi Loron, M.D.                           Operative Report  HISTORY OF PRESENT ILLNESS:  The patient is a 70 year old white male who I had previously performed a right L4-5 microdiskectomy in April 1999.  He did well from that surgery.  He made a full recovery.  He, unfortunately, began having recurrent right leg pain in a different distribution after a fall in August 2000.  He failed medical management, was worked up as an outpatient with a lumbar CT that demonstrated a herniated disc at L3-4 extraforaminally on the right.  His signs, symptoms, and physical examination which were consistent a right L3 radiculopathy. The patient failed medical management, therefore, weighed the risks, benefits, nd alternatives to surgery, and decided to proceed with a microdiskectomy.  PREOPERATIVE DIAGNOSES:  L3-4 degenerative disc disease, herniated nucleus pulposus.  POSTOPERATIVE DIAGNOSES:  L3-4 degenerative disc disease, herniated nucleus pulposus.  PROCEDURE:  Far lateral, i.e. extraforaminal right L3-4 microdiskectomy using microdissection.  SURGEON:  Dr. Delma Officer.  ASSISTANT:  Dr. Alanson Aly. Roxan Hockey.  ANESTHESIA:  General endotracheal anesthesia.  ESTIMATED BLOOD LOSS:  100 cc.  SPECIMENS:  None.  DRAINS:  None.  COMPLICATIONS:  None.  DESCRIPTION OF PROCEDURE:  The patient was brought to the operating room by anesthesia team.  General endotracheal anesthesia was induced.  He was then turned to the prone position on the [place] frame.  His lumbosacral region was then prepared with Betadine, scrubbed with Betadine solution, sterile drapes were applied.  I then injected the area to be incised with Marcaine with epinephrine  solution, and used the scalpel to make  an vertical incision over the L3-4 interspace.  I used electrocautery to dissect down to the thoracolumbar fascia. I divided just to the right of the midline, then performed a right-sided subperiosteal dissection, stripping the paraspinous musculature from the right spinous process, lamina at L3 and L4.  I then inserted a McCullough retractor for exposure.  I obtained intraoperative radiograph of this lumbar location.  I then brought the operating room microscope into field.  Under magnification and illumination I completed the microdissection/diskectomy.  I used electrocautery to detach the fascia from the lateral border of facet joint, and then I used the Midas-Rex high speed drill to drill out the lateral aspect of the right L3-4 facet joint.  Exposing the underlying intertransverse ligament, I incised the ligament with the scalpel and removed it with the Kerrison plunge.  I then dissected through the soft tissues, and identified the exiting L3 nerve root extraforaminally.  I  then dissected in the "axilla" of this nerve root, and found a large extraforaminal herniated disc which was compressing the ventral aspect of the right L3 nerve root. I then freed up the nerve root using microdissection, and was able to carefully  retract it gently with the Deaver retractor, thus exposing the herniated disc better.  I then incised the herniated disc and removed it with the pituitary forceps.  I formed a partial diskectomy in the intervertebral disc space using he pituitary forceps, [place]ov[place], and Ebstein curettes.  I then  coagulated the annulus about the diskectomy site, achieved hemostasis with bipolar electrocautery.  I hen palpated about the retrosurface of the nerve root, and there was no more herniated disc.  I used the oscillating tool to remove some spondylosis from the vertebral endplates at L3-4.  The nerve root was well decompressed.  I then copiously irrigated the wound  with bacitracin solution, removed the solution, then removed the McCullough retractor, and then reapproximated the patient thoracolumbar fascia with interrupted #1 Vicryl, subcutaneous tissue with interrupted 2-0 Vicryl, the skin with Steri-Strips and benzoin.  The wound was then coated with bacitracin ointment, a sterile dressing was applied.  The drapes was removed.  The patient was returned to a supine position, and subsequently extubated by the anesthesia team, then transported to the post-anesthesia care unit.  All sponge, instrument, and  needle counts were correct at the end of this case.DD:  01/26/00 TD:  01/26/00 Job: [id number]

## 2011-05-15 NOTE — Op Note (Signed)
Travis Woodward, Travis Woodward                          ACCOUNT NO.:  [id number]   MEDICAL RECORD NO.:  [id number]                   PATIENT TYPE:  INP   LOCATION:  3015                                 FACILITY:  [facility]   PHYSICIAN:  Cristi Loron, M.D.            DATE OF BIRTH:  06/08/1941   DATE OF PROCEDURE:  10/19/2002  DATE OF DISCHARGE:                                 OPERATIVE REPORT   PREOPERATIVE DIAGNOSES:  C3-4 herniated nucleus pulposus, spondylosis,  stenosis, cervicalgia, cervical radiculopathy, cervical myelopathy.   POSTOPERATIVE DIAGNOSES:  C3-4 herniated nucleus pulposus, spondylosis,  stenosis, cervicalgia, cervical radiculopathy, cervical myelopathy.   PROCEDURES:  1. C3-4 extensive anterior cervical decompression and diskectomy.  2. Interbody iliac crest allograft arthrodesis.  3. Anterior cervical plating (Synthes anterior plate and screws).   SURGEON:  Cristi Loron, M.D.   ASSISTANT:  Hilda Lias, M.D.   ANESTHESIA:  General endotracheal.   ESTIMATED BLOOD LOSS:  100 cc.   SPECIMENS:  None.   DRAINS:  None.   COMPLICATIONS:  None.   BRIEF HISTORY:  The patient is a 70 year old white male on whom I performed  prior lumbar surgeries.  He developed neck pain with arm pain and numbness.  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  I  recommended surgery.  The patient weighed the risks, benefits, and  alternatives of surgery and agreed to proceed with the operation.   DESCRIPTION OF PROCEDURE:  The patient was brought to the operating room by  the anesthesia team.  General endotracheal anesthesia was induced.  The  patient remained in the supine position.  A roll was placed under his  shoulders to place  his neck in slight extension.  His anterior cervical  region was then prepared with Betadine scrub and  Betadine solution.  Sterile drapes were applied.  I then injected the area to be incised with  Marcaine with epinephrine solution and then used a scalpel to make a left-  sided transverse incision in the patient's neck.  I used the Metzenbaum  scissors to divide the platysma muscle and then dissected medial to the  sternocleidomastoid muscle, jugular vein, and carotid artery.  I bluntly  dissected down toward the anterior cervical spine, carefully identifying the  esophagus and retracting it medially.  The Kitner swabs were used to clear  the soft tissue from the anterior cervical spine, and then a bent spinal  needle was inserted in the exposed interspace and an intraoperative  radiograph was obtained, confirming that we were at C3-4.  Electrocautery  was used to detach the medial border off the longus colli muscle bilaterally  from the C3-4 intervertebral disk  space, and the Caspar self-retaining  retractor was inserted for exposure.  The C3-4 intervertebral disk was then  incised with a 15 blade scalpel, and a partial diskectomy was performed  using a pituitary forceps and the Karlin curettes.  Distraction screws were  placed into C3 and C4.  The interspace was distracted, and then a high-speed  drill was used to decorticate the vertebral end plates at Z6-1 and to drill  away some considerable posterior spondylosis as well as to thin out the  posterior longitudinal ligament.  The ligament was then incised with the  arachnoid knife and removed with the Kerrison punch, undercutting the  vertebral end plates at W9-6, decompressing the thecal sac, and then a  foraminotomy was performed about the bilateral C4 nerve root.  At this point  we had a good decompression.   We now turned dour attention to the anterior spinal arthrodesis.  We  obtained the iliac crest tricortical allograft bone  graft and fashioned it  to these approximate dimensions:  7 mm in height and 1 cm in depth.  The  bone graft was then inserted into the distracted, decorticated  intervertebral disk space at C3-4, and distraction screws were removed and  it was noted that there was a good, snug fit of the bone graft.  We now turned our attention to the anterior spinal instrumentation.  Obtained the appropriate-length Synthes anterior cervical plate, laid it  along the anterior aspect of the vertebral bodies at C3 and C4, drilled two  holes at C3, two at C4, and tapped the holes and then secured the plate to  the vertebral bodies with two 14 mm screws at each vertebral body.  We  obtained the intraoperative radiograph that demonstrated good position of  the plate, screws, and interbody graft.  Then the locking screws were used  to secure each screw to the plate.  We then obtained stringent hemostasis  using bipolar cautery and Gelfoam.  The wound was then  copiously irrigated  out with bacitracin solution.  The solution was removed, as was the Caspar  self-retaining retractor, and then the esophagus was inspected for any  damage.  There was none apparent.  We then reapproximated the patient's  platysma muscle with interrupted 3-0 Vicryl suture, the subcutaneous tissue  with interrupted 3-0 Vicryl suture, and the skin with Steri-Strips and  Benzoin.  The wound was then coated with bacitracin ointment and a sterile  dressing was applied, the drapes were removed, and the patient was  subsequently extubated by the anesthesia team and transported to the  postanesthesia care unit in stable condition.  All sponge, instrument, and  needle counts were correct at the end of this case.                                               Cristi Loron, M.D.    JDJ/MEDQ  D:  10/19/2002  T:  10/20/2002  Job:  [id number]

## 2011-05-15 NOTE — Discharge Summary (Signed)
[facility]  Patient:    Travis Woodward                        MRN: [id number] Adm. Date:  [id number] Disc. Date: [id number] Attending:  Cristi Loron CC:         Verl Dicker, M.D.                           Discharge Summary  For full details of this admission, please refer to the typed history and physical.  BRIEF HISTORY:  The patient is a 70 year old white male who I have previously performed a right L4-5 microdiskectomy in April 1999.  He did well from that surgery and made a full recovery.  Unfortunately he stumbled while at work and re-injured his back in August 2000.  He began having pain into his right proximal leg and groin.  He failed medical management.  Was worked up as an outpatient with a lumbar CT.  It demonstrated a herniated disk at L3-4 on the right.  His signs and symptoms of physical exam were consistent with a right L3 radiculopathy.  He therefore weighed the risks, benefits, and alternatives of surgery and decided o proceed with a microdiskectomy.  For past medical history, past surgical history, medications prior to admission, drug allergies, family medical history, social history, admission physical exam, imaging studies, assessment and plan, etc., please refer to the typed history and physical.  HOSPITAL COURSE:  I admitted the patient to [facility] on January 29, 001 with the diagnosis of L3-4 degenerative disease, herniated nucleus pulposus, lumbago, lumbar radiculopathy.  On the day of admission I performed a far lateral L3-4 microdiskectomy using microdissection.  The surgery went well without complications (for full details of this operation, please refer to the typed operative note).  POSTOPERATIVE COURSE:  The patients postoperative course was remarkable only for some brief urinary retention which required placement of a Foley catheter.  Dr.  Wanda Plump, his urologist, saw him and  recommended he be started on Cipro and Flomax.  The catheter was discontinued at 6 a.m. on postop day #1 and after that he was able to urinate fine.  At that time he was afebrile, vital signs stable. He was eating well, ambulating well.  His wound was healing well without signs of infection and his leg pain had largely resolved.  He requested discharge to home. He was discharged on January 27, 2000.  DISCHARGE MEDICATIONS:  1. Flomax 0.4 mg p.o. q.d., #14, no refills.  2. Cipro 250 mg, #10, 1 p.o. b.i.d., no refills.  3. Percocet 5, #60, 1-2 p.o. q.4h. p.r.n. pain, limit 8 per day, no refills.  4. Valium 5 mg, #50, 1 p.o. q.6h. p.r.n. for muscle spasm, 1 refill.  DISCHARGE INSTRUCTIONS:  The patient was given written discharge instructions and instructed to follow up with me in three weeks, with Dr. Wanda Plump in three months.  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  PROCEDURE PERFORMED:  L3-4 far lateral microdiskectomy using microdissection.DD:  01/27/00 TD:  01/27/00 Job: [id number]

## 2011-05-15 NOTE — Cardiovascular Report (Signed)
NAMEABRHAM, Travis Woodward                          ACCOUNT NO.:  [id number]   MEDICAL RECORD NO.:  [id number]                   PATIENT TYPE:  INP   LOCATION:  6523                                 FACILITY:  [facility]   PHYSICIAN:  Nicki Guadalajara, M.D.                  DATE OF BIRTH:  07/31/1941   DATE OF PROCEDURE:  DATE OF DISCHARGE:                              CARDIAC CATHETERIZATION   PROCEDURE:  Cardiac catheterization and percutaneous coronary intervention.   INDICATIONS:  [name] is a 70 year old male patient who has  developed recent increasing episodes of chest pain leading to hospital  admission yesterday.  Symptom complex was suggestive of unstable angina.  Definitive catheterization was recommended.   PROCEDURE:  After premedication with Valium 5 mg intravenous, the patient  was prepped and draped in the usual fashion.  His right femoral artery was  punctured anteriorly and a 5 French sheath was inserted without difficulty.  Diagnostic catheterization was done with 5 French Judkins 4 left and right  coronary catheters.  The right catheter was also used for selective  angiography into the left internal mammary artery system in the event the  patient required CBG surgery.  A pigtail catheter was used for biplane cine  left ventriculography as well as distal aortography.  With the demonstration  of high-grade near-ostial LAD stenosis, the decision was made to attempt  intervention to this.  Because of the significant plaque burden, it was felt  that initial pre-dilatation should be done with cutting balloon atherotomy  for more balanced plaque dispersion.  Plavix 600 mg was administered orally  and the patient was given double-bolus Integrilin with weight-adjusted 4800  units of heparin.  ACT was documented to be 268 seconds.  An FL4 guide was  used for the intervention and a __________ Medtronic marker wire was used  and advanced down the LAD system.  A 2.5 x 10 mm cutting  balloon atherotomy  catheter was then inserted and several inflations up to six atmospheres were  made.  A 3.5 x 13 mm drug-eluting Cypher stent was then successfully  deployed with careful positioning of the stent at the ostium of the LAD  without encroachment into the left main or circumflex territory.  This was  dilated ultimately to 16 atmospheres.  Post-stent dilatation was done with a  4.0 x 12 mm Quantum balloon.  Scout angiography confirmed an excellent  angiographic result.  ACT was documented to be therapeutic.  The arterial  sheath was sutured in place with plans for sheath removal later today.   HEMODYNAMIC DATA:  1.  Central aortic pressure was 121/69.  2.  Left ventricular pressure was 121/7, post A-wave 10.   ANGIOGRAPHIC DATA:  1.  Left main coronary artery was a large-caliber vessel which bifurcated      into an LAD and left circumflex system.  2.  The LAD had a  focal concentric high 95% ostial stenosis.  The LAD gave      rise to a moderate-sized first diagonal vessel that had diffuse 50-60%      ostial to proximal narrowing. The mid-LAD had another small area of 30-      40% narrowing.  3.  The circumflex vessel was angiographically normal and gave rise to one      major marginal vessel.  4.  The right coronary artery was angiographically normal and ended in a      small PDA and posterolateral system.  5.  The left subclavian and internal mammary artery were normal and suitable      for CBG surgery if necessary.  6.  Biplane cine left ventriculography revealed normal LV contractility      without focal segmental wall motion abnormalities.  7.  Distal aortography did not demonstrate any evidence for renal artery      stenosis.  There was no significant aortoiliac disease.   Following successful cutting balloon atherotomy and stenting with a 3.5 x 13  mm drug-eluting Cypher stent post-dilated to 4.0 mm, the 95% ostial LAD  stenosis was reduced to 0%.  There was no  change in the mid-30 to 40% LAD  lesion, and in the 60% diagonal lesion, although the LAD and diagonal  vessels were now much larger in appearance secondary to significantly  improved flow secondary to opening the ostial LAD high-grade stenosis.   IMPRESSION:  1.  Normal left ventricular function.  2.  Single-vessel coronary artery disease involving the left anterior      descending coronary artery with 95% ostial left anterior descending      stenosis, 60% diffuse proximal diagonal stenosis, and 30-40% mid-left      anterior descending artery stenosis.  3.  Successful cutting balloon atherotomy/percutaneous transluminal coronary      angioplasty and stenting of the 95% ostial left anterior descending      coronary artery lesion with a percent diameter reduction to 0% without      evidence for dissection and with TIMI-3 flow.  4.  Double-bolus Integrilin/600 mg oral Plavix in addition to weight-      adjusted heparin for anticoagulation.                                               Nicki Guadalajara, M.D.    TK/MEDQ  D:  09/03/2004  T:  09/04/2004  Job:  [id number]   cc:   Tamela Gammon. Sherwood Gambler, M.D.  [address]  [place]  [place] [address]  Fax: [phone]

## 2011-05-15 NOTE — Op Note (Signed)
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   OPERATION PERFORMED:  C4-5 and C5-6 extensive anterior cervical  diskectomy/decompression; interbody iliac crest allograft arthrodesis;  anterior cervical plating (Codman Slim Lock titanium plate and screws);  removal of C3-4 Synthes anterior cervical plate.   SURGEON:  Cristi Loron, M.D.   ASSISTANT:  Hilda Lias, M.D.   ANESTHESIA:  General endotracheal.   ESTIMATED BLOOD LOSS:  100 mL.   SPECIMENS:  None.   DRAINS:  None.   COMPLICATIONS:  None.   INDICATIONS FOR PROCEDURE:  The patient is a 70 year old white male on whom  I performed a prior C3-4 anterior diskectomy and fusion a number of years  ago.  The patient did well but more recently has developed recurrent neck  and right shoulder and arm pain.  He has failed medical management and was  worked up with a cervical MRI and myelo-CT which demonstrated the patient  had spinal stenosis at C5-6 and to a lesser extent, C4-5.  I discussed the  various treatment options with the patient including surgery and the patient  has weighed the risks, benefits and alternatives to surgery and decided to  proceed with a C4-5 and C5-6 anterior cervical diskectomy with removal of  the plate at P2-9.   DESCRIPTION OF PROCEDURE:  The patient was brought to the operating room by  the anesthesia team.  General endotracheal anesthesia was induced.  The  patient remained in supine  position.  A roll was placed under the shoulders  to place the neck in slight extension.  The anterior cervical region was  then prepared with Betadine scrub and Betadine solution.  Sterile drapes  were applied.  I then injected the area to be incised with Marcaine with  epinephrine solution, used a scalpel to make a transverse incision in the  patient's left anterior neck.  I used Metzenbaum scissors to divide the  platysma muscle and then to dissect through the scar tissue from the  previous operation.  I carefully dissected down toward the anterior cervical  spine, identifying the esophagus and retracting it medially with the hand  held retractors.  I used the Kitner swabs to clear the soft tissue from the  anterior cervical spine and expose the previous plate at J1-8.  There was an  expected amount of scar tissue.  I then incised the scar tissue over the  plate and then removed the expanding/locking screws from the plate and then  removed the plate. We then used electrocautery to detach the medial border  of the longus colli muscle bilaterally from C4-5 and  C5-6 intervertebral  disk space.  We then inserted a Caspar self-retaining retractor underneath  the longus colli muscle bilaterally to provide exposure.  We began at C4-5,  we incised the C4-5 intervertebral disk and performed a partial diskectomy  using pituitary forceps and the Carlens curets.  We inserted distraction  screws at C4 and C5, distracted the interspace and then used a high speed  drill to decorticate vertebral end plates at Z6-1, drill away the remainder  of  the C4-5 intervertebral disk, drilled away some posterior spondylosis  and to thin out the posterior longitudinal ligament.  I then brought the  operating microscope into the field and under its magnification and  illumination, completed the microdissection.  We incised the thinned out  ligament with the arachnoid knife and removed it with the Kerrison punch   undercutting the vertebral end plates at W9-6, decompressing the thecal sac.  We then performed generous foraminotomies about the bilateral C5 nerve root  completing the decompression at this level.  We then repeated this procedure  in analogous fashion at C5-6, decompressing the thecal sac and the bilateral  C6 nerve roots at this level completing the decompressions.   We now turned our attention to arthrodesis.  We obtained iliac crest  tricortical allograft bone graft and fashioned it to these approximate  dimensions.  7 mm height and 1 cm in depth.  Inserted one bone graft into  distracted C5-6 interspace and then removed a distraction screw from C6,  placed it back in C4, distracted the C4-5 interspace and placed the second  bone graft in the C4-5 interspace.  We then removed distraction screws and  noted there was a good snug fit of the bone graft at both levels.  We then  used a high speed drill to remove some ventral spondylosis from the  vertebral end plates at E4-5 and C5-6 so that the plate would lie down flat.  We then selected the appropriate length anterior cervical plate.  I then  laid it along the anterior aspect of the vertebral bodies from C4 down to  C6.  We then drilled two 14 mm holes at C4, 5, and 6 and then secured the  plate to the vertebral body by placing two 14 mm self-tapping screws in at  C4, 5, and 6.  We then obtained an intraoperative radiograph demonstrating  good position of plate screws and interbody graft.  We then secured the  screws to the plate by locking each cam, completing the instrumentation.   We then obtained stringent hemostasis using bipolar electrocautery.  We  copiously irrigated the wound out with bacitracin solution, removed the  Caspar self-retaining retractor.  We inspected the esophagus for any damage.  There was none apparent.  We then reapproximated the patient's platysma muscle with interrupted 3-0 Vicryl suture, subcutaneous tissues  with  interrupted 3-0 Vicryl suture and the skin with benzoin and Steri-Strips.  The wound was then coated with bacitracin ointment.  A sterile dressing was  applied.  The drapes were removed.  The patient was subsequently extubated  by the anesthesia team and transported to the post anesthesia care unit in  stable condition.  All sponge, needle and instrument counts were correct at  the end of this case.      Cristi Loron, M.D.  Electronically Signed     JDJ/MEDQ  D:  01/11/2006  T:  01/12/2006  Job:  [id number]

## 2011-05-15 NOTE — Discharge Summary (Signed)
Travis Woodward, Travis Woodward                 ACCOUNT NO.:  [id number]   MEDICAL RECORD NO.:  [id number]          PATIENT TYPE:  INP   LOCATION:  6523                         FACILITY:  [facility]   PHYSICIAN:  Nicki Guadalajara, M.D.     DATE OF BIRTH:  [birth year]   DATE OF ADMISSION:  09/02/2004  DATE OF DISCHARGE:  09/04/2004                                 DISCHARGE SUMMARY   ADMISSION DIAGNOSES:  1.  Chest pain x2 weeks.  2.  History of tobacco use.  3.  Hyperlipidemia.   DISCHARGE DIAGNOSES:  1.  Chest pain x2 weeks.  2.  History of tobacco use.  3.  Hyperlipidemia.   PROCEDURE:  Cardiac catheterization on September 03, 2004, with a 95%  proximal LAD and a 50%-60% proximal D-I, and a 30%-40% mid-LAD stenosis,  with a PCI using a CYPHER stent to the LAD.   HISTORY OF PRESENT ILLNESS:  The patient is a 70 year old white male, a  medical patient of Dr. Clayborne Dana and Dr. Madelin Rear. Fusco's,  referred to the emergency room for chest pain.  There were no precipitating  factors, but he had a 5-6/10 pain.  No nausea or dyspnea, no diaphoresis.   HOSPITAL COURSE:  The patient was admitted and stabilized.  The CK's and  troponins were negative.  He was scheduled for a cardiac catheterization on  September 03, 2004.  This was performed with the above-noted results, a 95%  LAD stenosis proximally.  There was a 50%-60% D-I stenosis, and a mid-30%-  40% LAD stenosis.  There was no other significant disease.  After reviewing  the study he underwent a PCI and stenting using a CYPHER 3.5 x 13 stent to  his LAD.  He tolerated the procedure well and was returned to telemetry.   DISPOSITION:  He remained in sinus rhythm and was ultimately discharged home  later that day.   DISCHARGE MEDICATIONS:  1.  The medication sheet listing his medicines is not available.  On      admission he was on no medications.  He should be discharged on aspirin      81 mg daily.  2.  Plavix 75 mg daily.  3.  Vytorin 10/40  mg, one daily.   ALLERGIES:  PAXIL.   ACTIVITY:  Light to moderate as tolerated.  No lifting of over 10 pounds.  No strenuous activity.   FOLLOWUP:  May return to see Dr. Allyson Sabal in approximately two weeks.   CONDITION ON DISCHARGE:  Improved.       WDJ/MEDQ  D:  09/23/2004  T:  09/23/2004  Job:  [id number]   cc:   Madelin Rear. Sherwood Gambler, M.D.  [address]  [place]  [place] [address]  Fax: [phone]

## 2011-09-05 IMAGING — US US ABDOMEN COMPLETE
1 series · 14 of 25 positions shown · non-contrast
Comparison: None.

CLINICAL DATA: Anemia, nausea, vomiting.

COMPLETE ABDOMINAL ULTRASOUND

[Series 1: us abdomen complete · 0.29mm/px · 14 of 71 slices shown]
[im 1/71]
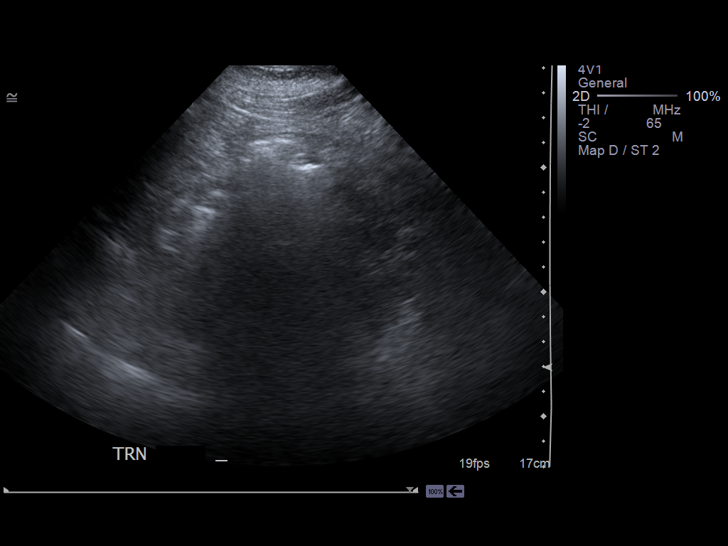
[im 6/71]
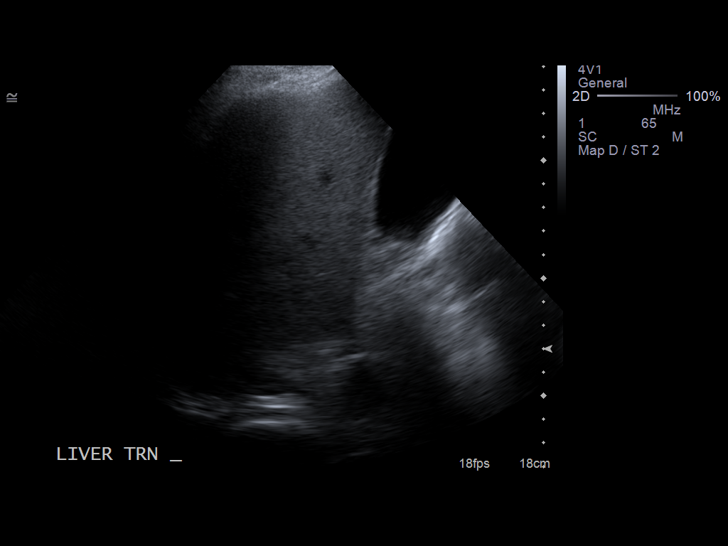
[im 12/71]
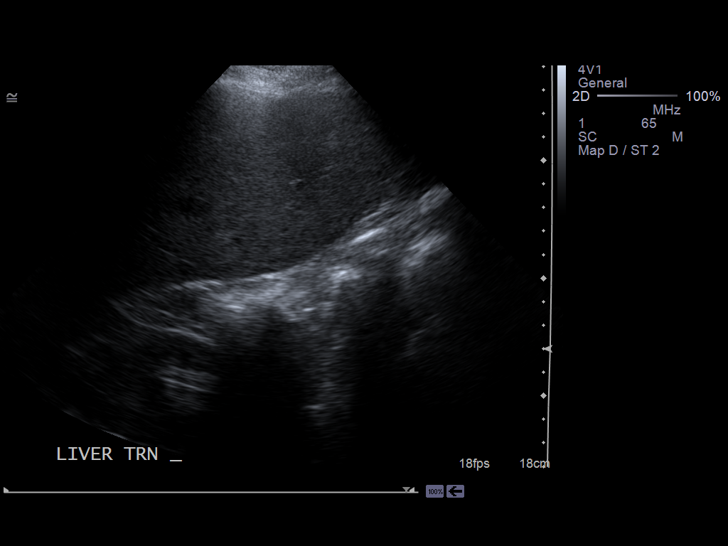
[im 18/71]
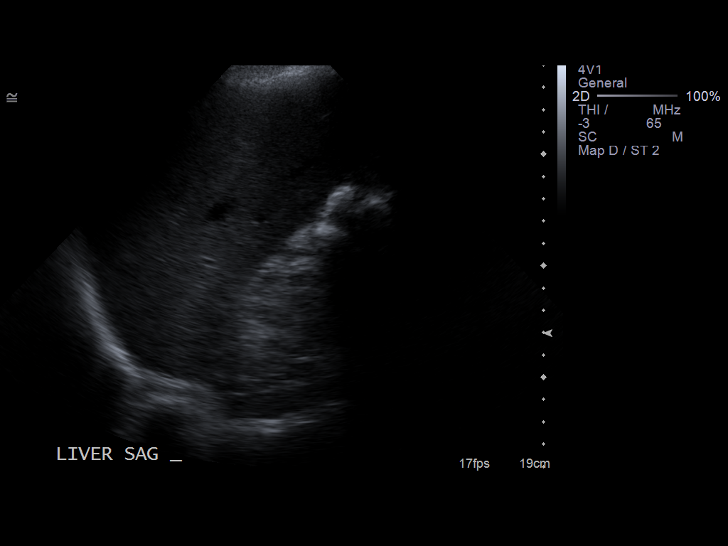
[im 24/71]
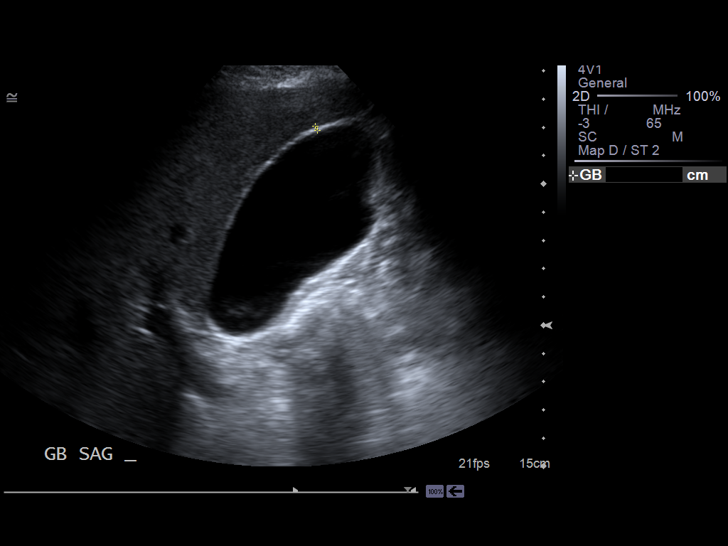
[im 27/71]
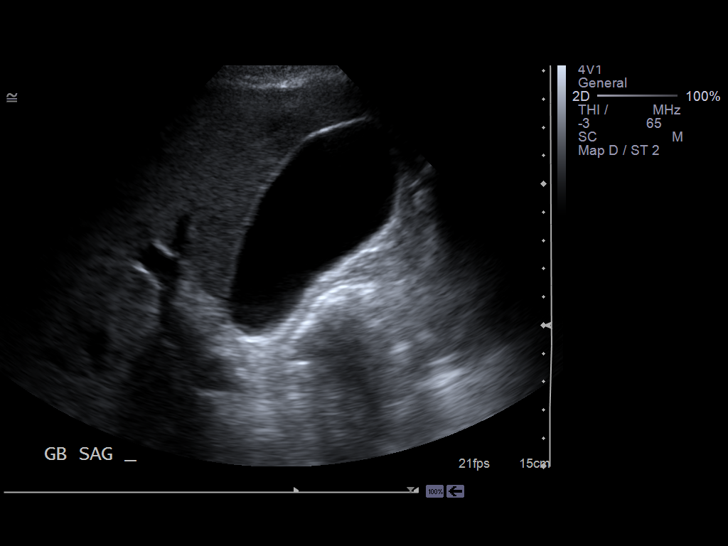
[im 33/71]
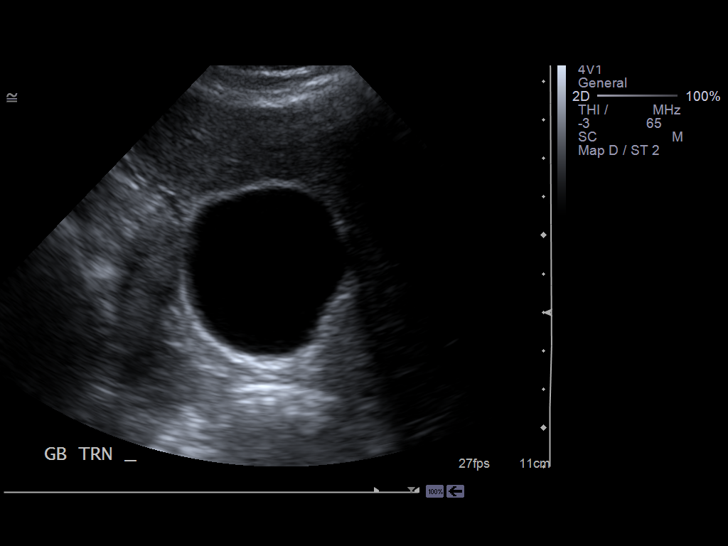
[im 38/71]
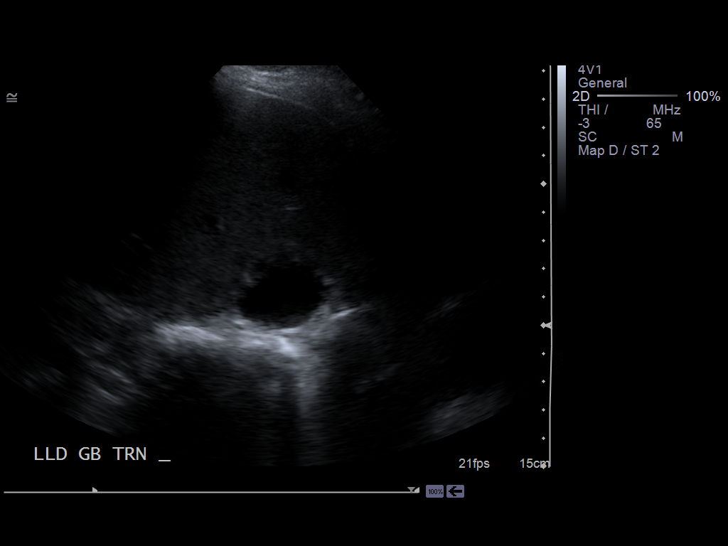
[im 44/71]
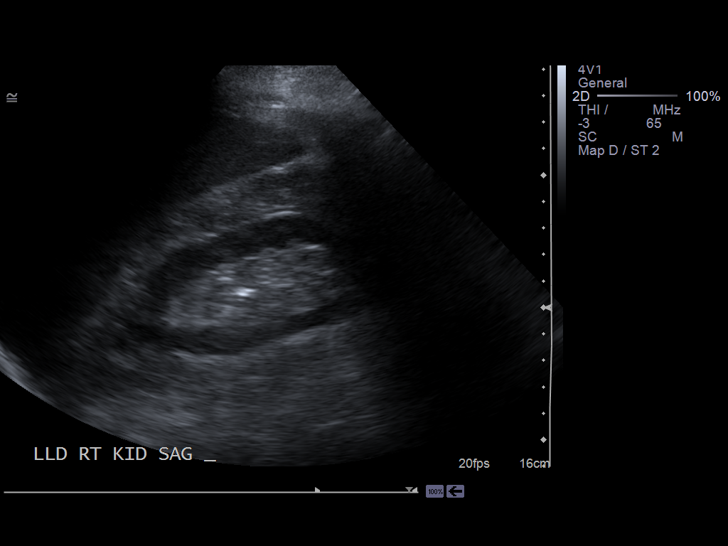
[im 47/71]
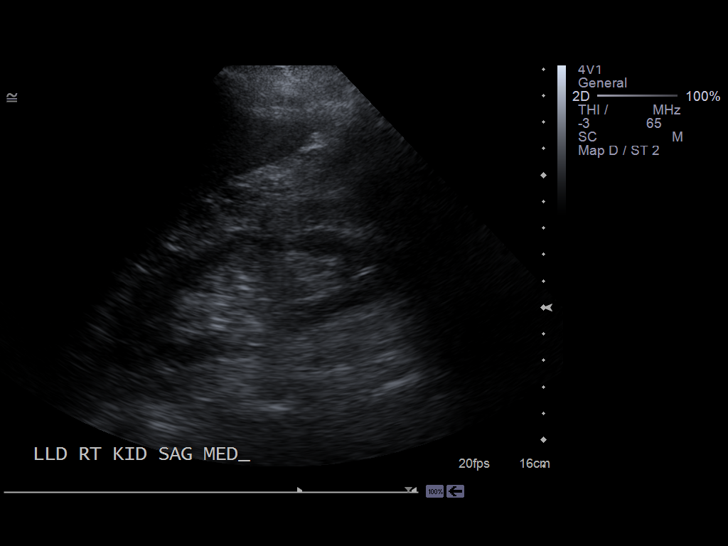
[im 53/71]
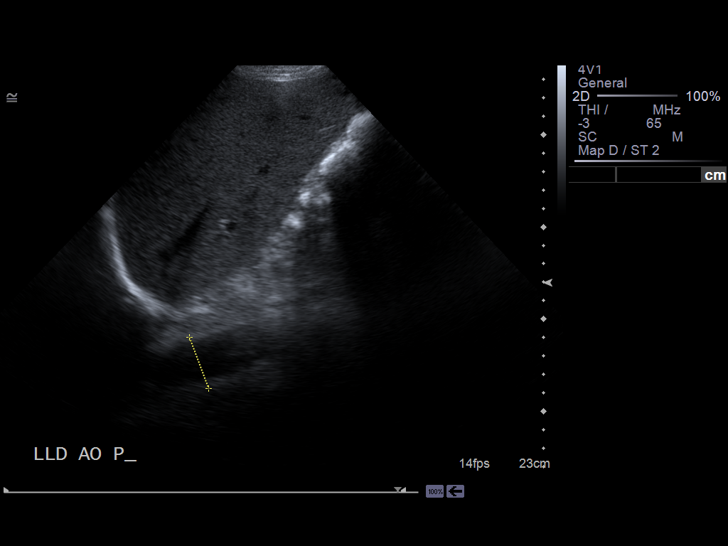
[im 59/71]
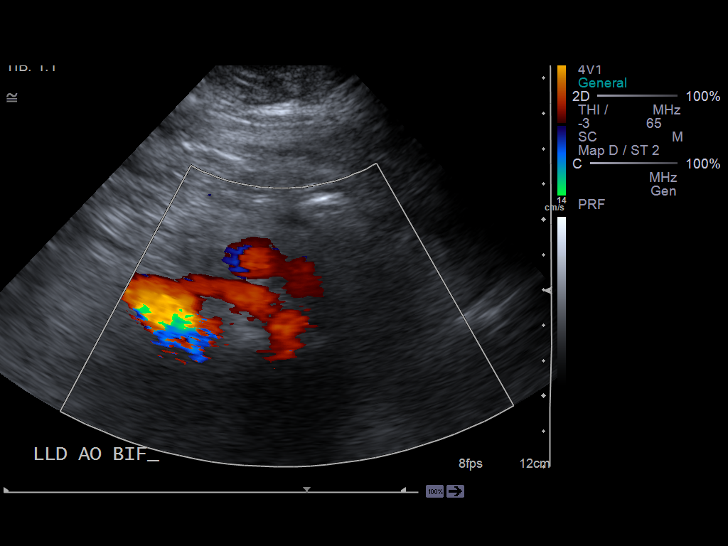
[im 65/71]
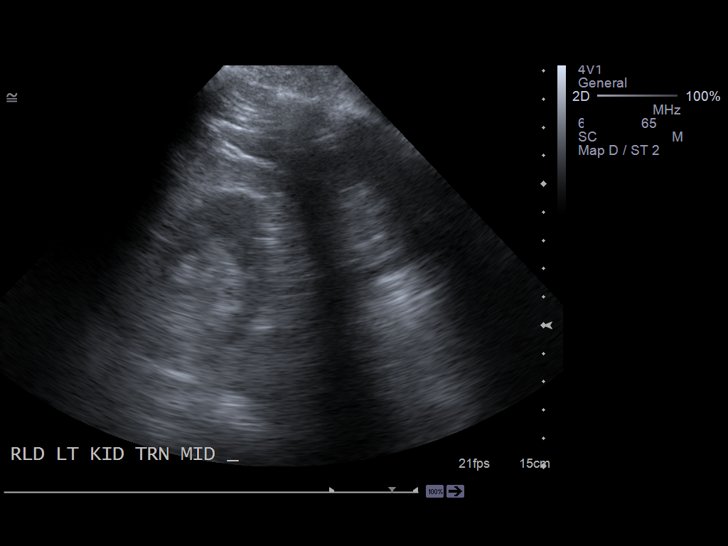
[im 71/71]
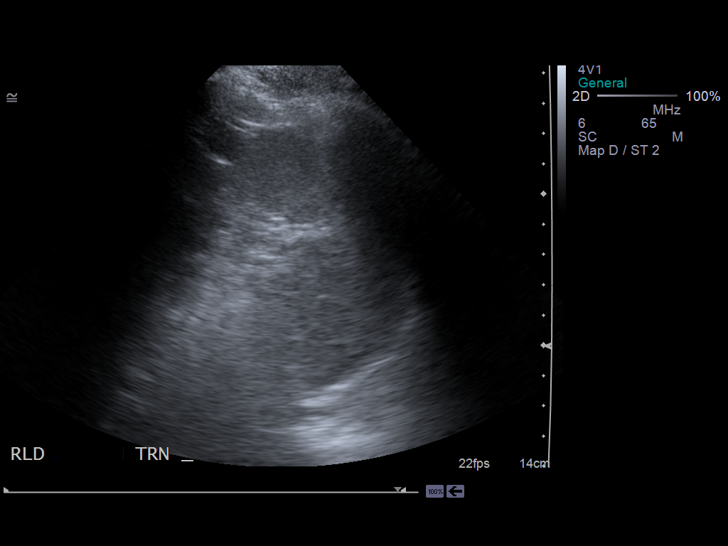

[14 of 25 positions shown; findings below may reference images not displayed]

FINDINGS: Gallbladder:  No gallstones, gallbladder wall thickening, or
pericholecystic fluid.

Common bile duct:   Within normal limits in caliber.

Liver:  No focal lesion identified.  Within normal limits in
parenchymal echogenicity.

IVC:  Appears normal.

Pancreas:  Not well visualized due to overlying bowel gas.

Spleen:  Within normal limits in size and echotexture.

Right Kidney:   Normal in size and parenchymal echogenicity.  No
evidence of mass or hydronephrosis.

Left Kidney:  Normal in size and parenchymal echogenicity.  No
evidence of mass or hydronephrosis.

Abdominal aorta:  No aneurysm identified.
IMPRESSION: No significant or acute findings.  Pancreas cannot be visualized
due to overlying bowel gas.

## 2011-09-29 LAB — BASIC METABOLIC PANEL
BUN: 21
CO2: 30
Calcium: 9.1
GFR calc non Af Amer: >60
Glucose, Bld: 98
Potassium: 4.4

## 2011-09-29 LAB — CBC
HCT: 39.4
MCHC: 33.5
Platelets: 157
RDW: 13.8

## 2012-02-12 DIAGNOSIS — G8929 Other chronic pain: Secondary | ICD-10-CM | POA: Diagnosis not present

## 2012-02-12 DIAGNOSIS — D510 Vitamin B12 deficiency anemia due to intrinsic factor deficiency: Secondary | ICD-10-CM | POA: Diagnosis not present

## 2012-02-12 DIAGNOSIS — Z6828 Body mass index (BMI) 28.0-28.9, adult: Secondary | ICD-10-CM | POA: Diagnosis not present

## 2012-03-22 DIAGNOSIS — D518 Other vitamin B12 deficiency anemias: Secondary | ICD-10-CM | POA: Diagnosis not present

## 2012-04-16 DIAGNOSIS — R109 Unspecified abdominal pain: Secondary | ICD-10-CM | POA: Diagnosis not present

## 2012-04-17 ENCOUNTER — Encounter (HOSPITAL_COMMUNITY)
Admission: EM | Disposition: A | Discharge status: Home / Self Care | Payer: Self-pay | Source: Home / Self Care | Attending: General Surgery

## 2012-04-17 ENCOUNTER — Encounter (HOSPITAL_COMMUNITY): Payer: Self-pay | Admitting: *Deleted

## 2012-04-17 ENCOUNTER — Inpatient Hospital Stay (HOSPITAL_COMMUNITY)
Admission: EM | Admit: 2012-04-17 | Discharge: 2012-04-20 | DRG: 340 | Disposition: A | Discharge status: Home / Self Care | Payer: Medicare Other | Attending: General Surgery | Admitting: General Surgery

## 2012-04-17 ENCOUNTER — Encounter (HOSPITAL_COMMUNITY): Payer: Self-pay | Admitting: Anesthesiology

## 2012-04-17 ENCOUNTER — Emergency Department (HOSPITAL_COMMUNITY): Payer: Medicare Other

## 2012-04-17 ENCOUNTER — Inpatient Hospital Stay (HOSPITAL_COMMUNITY): Payer: Medicare Other | Admitting: Anesthesiology

## 2012-04-17 DIAGNOSIS — I2510 Atherosclerotic heart disease of native coronary artery without angina pectoris: Secondary | ICD-10-CM | POA: Diagnosis present

## 2012-04-17 DIAGNOSIS — K352 Acute appendicitis with generalized peritonitis, without abscess: Principal | ICD-10-CM | POA: Diagnosis present

## 2012-04-17 DIAGNOSIS — K219 Gastro-esophageal reflux disease without esophagitis: Secondary | ICD-10-CM | POA: Diagnosis present

## 2012-04-17 DIAGNOSIS — R109 Unspecified abdominal pain: Secondary | ICD-10-CM | POA: Diagnosis not present

## 2012-04-17 DIAGNOSIS — M549 Dorsalgia, unspecified: Secondary | ICD-10-CM | POA: Diagnosis present

## 2012-04-17 DIAGNOSIS — M1990 Unspecified osteoarthritis, unspecified site: Secondary | ICD-10-CM | POA: Diagnosis present

## 2012-04-17 DIAGNOSIS — K35209 Acute appendicitis with generalized peritonitis, without abscess, unspecified as to perforation: Principal | ICD-10-CM | POA: Diagnosis present

## 2012-04-17 DIAGNOSIS — K6389 Other specified diseases of intestine: Secondary | ICD-10-CM | POA: Diagnosis not present

## 2012-04-17 DIAGNOSIS — K3580 Unspecified acute appendicitis: Secondary | ICD-10-CM | POA: Diagnosis not present

## 2012-04-17 DIAGNOSIS — R188 Other ascites: Secondary | ICD-10-CM | POA: Diagnosis not present

## 2012-04-17 DIAGNOSIS — K3532 Acute appendicitis with perforation and localized peritonitis, without abscess: Secondary | ICD-10-CM

## 2012-04-17 DIAGNOSIS — R1084 Generalized abdominal pain: Secondary | ICD-10-CM | POA: Diagnosis not present

## 2012-04-17 HISTORY — PX: LAPAROSCOPIC APPENDECTOMY: SHX408

## 2012-04-17 LAB — COMPREHENSIVE METABOLIC PANEL
Alkaline Phosphatase: 76 U/L (ref 39–117)
BUN: 16 mg/dL (ref 6–23)
CO2: 21 mEq/L (ref 19–32)
Chloride: 100 mEq/L (ref 96–112)
Creatinine, Ser: 1.11 mg/dL (ref 0.50–1.35)
GFR calc non Af Amer: 65 mL/min — ABNORMAL LOW (ref >90)
Glucose, Bld: 159 mg/dL — ABNORMAL HIGH (ref 70–99)
Potassium: 4.1 mEq/L (ref 3.5–5.1)
Total Bilirubin: 1.6 mg/dL — ABNORMAL HIGH (ref 0.3–1.2)

## 2012-04-17 LAB — URINALYSIS, ROUTINE W REFLEX MICROSCOPIC
Bilirubin Urine: NEGATIVE
Ketones, ur: NEGATIVE mg/dL
Leukocytes, UA: NEGATIVE
Nitrite: NEGATIVE
Protein, ur: NEGATIVE mg/dL
pH: 5 (ref 5.0–8.0)

## 2012-04-17 LAB — DIFFERENTIAL
Basophils Relative: 2 % — ABNORMAL HIGH (ref 0–1)
Blasts: 0 %
Lymphocytes Relative: 24 % (ref 12–46)
Lymphs Abs: 1.2 10*3/uL (ref 0.7–4.0)
Myelocytes: 0 %
Neutro Abs: 3.5 10*3/uL (ref 1.7–7.7)
Neutrophils Relative %: 72 % (ref 43–77)
Promyelocytes Absolute: 0 %
nRBC: 0 /100 WBC

## 2012-04-17 LAB — LIPASE, BLOOD: Lipase: 15 U/L (ref 11–59)

## 2012-04-17 LAB — PROTIME-INR: Prothrombin Time: 15.3 seconds — ABNORMAL HIGH (ref 11.6–15.2)

## 2012-04-17 LAB — CBC
Hemoglobin: 11.9 g/dL — ABNORMAL LOW (ref 13.0–17.0)
MCHC: 32.7 g/dL (ref 30.0–36.0)
Platelets: 120 10*3/uL — ABNORMAL LOW (ref 150–400)
RDW: 14.1 % (ref 11.5–15.5)

## 2012-04-17 LAB — URINE MICROSCOPIC-ADD ON

## 2012-04-17 LAB — LACTIC ACID, PLASMA: Lactic Acid, Venous: 2 mmol/L (ref 0.5–2.2)

## 2012-04-17 SURGERY — APPENDECTOMY, LAPAROSCOPIC
Anesthesia: General | Wound class: Dirty or Infected

## 2012-04-17 MED ORDER — BUPIVACAINE HCL (PF) 0.5 % IJ SOLN
INTRAMUSCULAR | Status: DC | PRN
  Administered 2012-04-17: 7 mL

## 2012-04-17 MED ORDER — SUCCINYLCHOLINE CHLORIDE 20 MG/ML IJ SOLN
INTRAMUSCULAR | Status: DC | PRN
  Administered 2012-04-17: 140 mg via INTRAVENOUS

## 2012-04-17 MED ORDER — EPHEDRINE SULFATE 50 MG/ML IJ SOLN
INTRAMUSCULAR | Status: DC | PRN
  Administered 2012-04-17 (×2): 5 mg via INTRAVENOUS

## 2012-04-17 MED ORDER — HYDROMORPHONE HCL PF 1 MG/ML IJ SOLN
1.0000 mg | INTRAMUSCULAR | Status: DC | PRN
  Administered 2012-04-17 – 2012-04-18 (×7): 1 mg via INTRAVENOUS
  Filled 2012-04-17 (×7): qty 1

## 2012-04-17 MED ORDER — IOHEXOL 300 MG/ML  SOLN
100.0000 mL | Freq: Once | INTRAMUSCULAR | Status: AC | PRN
  Administered 2012-04-17: 100 mL via INTRAVENOUS

## 2012-04-17 MED ORDER — SUCCINYLCHOLINE CHLORIDE 20 MG/ML IJ SOLN
INTRAMUSCULAR | Status: AC
  Filled 2012-04-17: qty 1

## 2012-04-17 MED ORDER — ONDANSETRON HCL 4 MG/2ML IJ SOLN
4.0000 mg | Freq: Four times a day (QID) | INTRAMUSCULAR | Status: DC | PRN
  Administered 2012-04-17: 4 mg via INTRAVENOUS
  Filled 2012-04-17: qty 2

## 2012-04-17 MED ORDER — DIPHENHYDRAMINE HCL 50 MG/ML IJ SOLN: 12.5000 mg | Freq: Four times a day (QID) | INTRAMUSCULAR | Status: DC | PRN

## 2012-04-17 MED ORDER — GLYCOPYRROLATE 0.2 MG/ML IJ SOLN
INTRAMUSCULAR | Status: DC | PRN
Start: 2012-04-17 — End: 2012-04-17
  Administered 2012-04-17: .4 mg via INTRAVENOUS

## 2012-04-17 MED ORDER — PANTOPRAZOLE SODIUM 40 MG PO TBEC
40.0000 mg | DELAYED_RELEASE_TABLET | Freq: Every day | ORAL | Status: DC
  Administered 2012-04-17 – 2012-04-19 (×3): 40 mg via ORAL
  Filled 2012-04-17 (×3): qty 1

## 2012-04-17 MED ORDER — ROCURONIUM BROMIDE 100 MG/10ML IV SOLN
INTRAVENOUS | Status: DC | PRN
  Administered 2012-04-17: 20 mg via INTRAVENOUS
  Administered 2012-04-17: 10 mg via INTRAVENOUS
  Administered 2012-04-17 (×2): 5 mg via INTRAVENOUS

## 2012-04-17 MED ORDER — ONDANSETRON HCL 4 MG PO TABS: 4.0000 mg | ORAL_TABLET | Freq: Four times a day (QID) | ORAL | Status: DC | PRN

## 2012-04-17 MED ORDER — PROPOFOL 10 MG/ML IV EMUL
INTRAVENOUS | Status: DC | PRN
  Administered 2012-04-17: 150 mg via INTRAVENOUS

## 2012-04-17 MED ORDER — LACTATED RINGERS IV SOLN
INTRAVENOUS | Status: DC
  Administered 2012-04-17 – 2012-04-18 (×3): 1000 mL via INTRAVENOUS

## 2012-04-17 MED ORDER — NEOSTIGMINE METHYLSULFATE 1 MG/ML IJ SOLN
INTRAMUSCULAR | Status: DC | PRN
  Administered 2012-04-17: 2 mg via INTRAVENOUS

## 2012-04-17 MED ORDER — LIDOCAINE HCL (PF) 1 % IJ SOLN
INTRAMUSCULAR | Status: AC
  Filled 2012-04-17: qty 5

## 2012-04-17 MED ORDER — ACETAMINOPHEN 650 MG RE SUPP
650.0000 mg | Freq: Once | RECTAL | Status: AC
Start: 2012-04-17 — End: 2012-04-17
  Administered 2012-04-17: 650 mg via RECTAL
  Filled 2012-04-17: qty 1

## 2012-04-17 MED ORDER — LACTATED RINGERS IV SOLN
INTRAVENOUS | Status: DC | PRN
  Administered 2012-04-17: 09:00:00 via INTRAVENOUS

## 2012-04-17 MED ORDER — FENTANYL CITRATE 0.05 MG/ML IJ SOLN
INTRAMUSCULAR | Status: DC | PRN
  Administered 2012-04-17 (×3): 50 ug via INTRAVENOUS

## 2012-04-17 MED ORDER — SODIUM CHLORIDE 0.9 % IR SOLN
Status: DC | PRN
  Administered 2012-04-17: 1000 mL

## 2012-04-17 MED ORDER — GLYCOPYRROLATE 0.2 MG/ML IJ SOLN
INTRAMUSCULAR | Status: AC
  Filled 2012-04-17: qty 1

## 2012-04-17 MED ORDER — HYDROMORPHONE HCL PF 1 MG/ML IJ SOLN
1.0000 mg | Freq: Once | INTRAMUSCULAR | Status: AC
  Administered 2012-04-17: 1 mg via INTRAVENOUS
  Filled 2012-04-17: qty 1

## 2012-04-17 MED ORDER — NEOSTIGMINE METHYLSULFATE 1 MG/ML IJ SOLN
INTRAMUSCULAR | Status: AC
  Filled 2012-04-17: qty 10

## 2012-04-17 MED ORDER — HYDROMORPHONE HCL PF 1 MG/ML IJ SOLN
INTRAMUSCULAR | Status: AC
  Filled 2012-04-17: qty 1

## 2012-04-17 MED ORDER — PROPOFOL 10 MG/ML IV EMUL
INTRAVENOUS | Status: AC
  Filled 2012-04-17: qty 20

## 2012-04-17 MED ORDER — FENTANYL CITRATE 0.05 MG/ML IJ SOLN
INTRAMUSCULAR | Status: AC
  Filled 2012-04-17: qty 5

## 2012-04-17 MED ORDER — ONDANSETRON HCL 4 MG/2ML IJ SOLN: 4.0000 mg | Freq: Four times a day (QID) | INTRAMUSCULAR | Status: DC | PRN

## 2012-04-17 MED ORDER — SODIUM CHLORIDE 0.9 % IV SOLN
1.0000 g | INTRAVENOUS | Status: DC
  Administered 2012-04-17 – 2012-04-19 (×3): 1 g via INTRAVENOUS
  Filled 2012-04-17 (×4): qty 1

## 2012-04-17 MED ORDER — HYDROMORPHONE HCL PF 1 MG/ML IJ SOLN
0.5000 mg | INTRAMUSCULAR | Status: DC | PRN
  Administered 2012-04-17 (×2): 0.5 mg via INTRAVENOUS
  Filled 2012-04-17: qty 1

## 2012-04-17 MED ORDER — LACTATED RINGERS IV SOLN: INTRAVENOUS | Status: DC

## 2012-04-17 MED ORDER — BUPIVACAINE HCL (PF) 0.5 % IJ SOLN
INTRAMUSCULAR | Status: AC
  Filled 2012-04-17: qty 30

## 2012-04-17 MED ORDER — ROCURONIUM BROMIDE 50 MG/5ML IV SOLN
INTRAVENOUS | Status: AC
  Filled 2012-04-17: qty 1

## 2012-04-17 MED ORDER — ENOXAPARIN SODIUM 40 MG/0.4ML ~~LOC~~ SOLN
40.0000 mg | Freq: Every day | SUBCUTANEOUS | Status: DC
  Administered 2012-04-17: 40 mg via SUBCUTANEOUS

## 2012-04-17 MED ORDER — MIDAZOLAM HCL 2 MG/2ML IJ SOLN
INTRAMUSCULAR | Status: AC
  Filled 2012-04-17: qty 2

## 2012-04-17 MED ORDER — SODIUM CHLORIDE 0.9 % IR SOLN
Status: DC | PRN
  Administered 2012-04-17: 3000 mL

## 2012-04-17 MED ORDER — ENOXAPARIN SODIUM 40 MG/0.4ML ~~LOC~~ SOLN
40.0000 mg | SUBCUTANEOUS | Status: DC
  Administered 2012-04-17 – 2012-04-19 (×3): 40 mg via SUBCUTANEOUS
  Filled 2012-04-17 (×4): qty 0.4

## 2012-04-17 MED ORDER — SODIUM CHLORIDE 0.9 % IV SOLN
INTRAVENOUS | Status: DC
  Administered 2012-04-17: via INTRAVENOUS

## 2012-04-17 MED ORDER — SODIUM CHLORIDE 0.9 % IV SOLN
1.0000 g | Freq: Once | INTRAVENOUS | Status: AC
  Administered 2012-04-17: 1 g via INTRAVENOUS
  Filled 2012-04-17: qty 1

## 2012-04-17 MED ORDER — ONDANSETRON HCL 4 MG/2ML IJ SOLN
4.0000 mg | Freq: Once | INTRAMUSCULAR | Status: AC
  Administered 2012-04-17: 4 mg via INTRAVENOUS
  Filled 2012-04-17: qty 2

## 2012-04-17 MED ORDER — MIDAZOLAM HCL 5 MG/5ML IJ SOLN
INTRAMUSCULAR | Status: DC | PRN
  Administered 2012-04-17 (×2): 1 mg via INTRAVENOUS

## 2012-04-17 MED ORDER — ACETAMINOPHEN 325 MG PO TABS
650.0000 mg | ORAL_TABLET | Freq: Once | ORAL | Status: AC
  Administered 2012-04-17: 650 mg via ORAL

## 2012-04-17 MED ORDER — ENOXAPARIN SODIUM 40 MG/0.4ML ~~LOC~~ SOLN: 40.0000 mg | SUBCUTANEOUS | Status: DC

## 2012-04-17 MED ORDER — ACETAMINOPHEN 10 MG/ML IV SOLN
1000.0000 mg | Freq: Four times a day (QID) | INTRAVENOUS | Status: AC
  Administered 2012-04-17 – 2012-04-18 (×4): 1000 mg via INTRAVENOUS
  Filled 2012-04-17 (×4): qty 100

## 2012-04-17 MED ORDER — HYDROMORPHONE HCL PF 1 MG/ML IJ SOLN: 0.5000 mg | INTRAMUSCULAR | Status: DC | PRN

## 2012-04-17 MED ORDER — LIDOCAINE HCL (CARDIAC) 10 MG/ML IV SOLN
INTRAVENOUS | Status: DC | PRN
  Administered 2012-04-17: 10 mg via INTRAVENOUS

## 2012-04-17 MED ORDER — DIPHENHYDRAMINE HCL 12.5 MG/5ML PO ELIX: 12.5000 mg | ORAL_SOLUTION | Freq: Four times a day (QID) | ORAL | Status: DC | PRN

## 2012-04-17 SURGICAL SUPPLY — 52 items
BAG HAMPER (MISCELLANEOUS) ×2 IMPLANT
BAG SPEC RTRVL LRG 6X4 10 (ENDOMECHANICALS) ×1
CLOTH BEACON ORANGE TIMEOUT ST (SAFETY) ×2 IMPLANT
COVER SURGICAL LIGHT HANDLE (MISCELLANEOUS) ×4 IMPLANT
CUTTER ENDO LINEAR 45M (STAPLE) IMPLANT
CUTTER LINEAR ENDO 35 ETS (STAPLE) IMPLANT
CUTTER LINEAR ENDO 35 ETS TH (STAPLE) ×1 IMPLANT
DECANTER SPIKE VIAL GLASS SM (MISCELLANEOUS) ×1 IMPLANT
DISSECTOR BLUNT TIP ENDO 5MM (MISCELLANEOUS) IMPLANT
DURAPREP 26ML APPLICATOR (WOUND CARE) ×2 IMPLANT
ELECT REM PT RETURN 9FT ADLT (ELECTROSURGICAL) ×2
ELECTRODE REM PT RTRN 9FT ADLT (ELECTROSURGICAL) ×1 IMPLANT
EVACUATOR DRAINAGE 10X20 100CC (DRAIN) IMPLANT
EVACUATOR SILICONE 100CC (DRAIN) ×2
FILTER SMOKE EVAC LAPAROSHD (FILTER) ×2 IMPLANT
FORMALIN 10 PREFIL 120ML (MISCELLANEOUS) ×2 IMPLANT
GLOVE BIO SURGEON STRL SZ7.5 (GLOVE) ×2 IMPLANT
GLOVE BIOGEL PI IND STRL 7.5 (GLOVE) IMPLANT
GLOVE BIOGEL PI INDICATOR 7.5 (GLOVE) ×3
GLOVE ECLIPSE 7.0 STRL STRAW (GLOVE) ×1 IMPLANT
GLOVE EXAM NITRILE MD LF STRL (GLOVE) ×1 IMPLANT
GOWN STRL REIN XL XLG (GOWN DISPOSABLE) ×6 IMPLANT
INST SET LAPROSCOPIC AP (KITS) ×2 IMPLANT
IV NS IRRIG 3000ML ARTHROMATIC (IV SOLUTION) ×2 IMPLANT
KIT ROOM TURNOVER APOR (KITS) ×2 IMPLANT
MANIFOLD NEPTUNE II (INSTRUMENTS) ×2 IMPLANT
NDL INSUFFLATION 14GA 120MM (NEEDLE) ×1 IMPLANT
NEEDLE INSUFFLATION 14GA 120MM (NEEDLE) ×2 IMPLANT
NS IRRIG 1000ML POUR BTL (IV SOLUTION) ×2 IMPLANT
PACK LAP CHOLE LZT030E (CUSTOM PROCEDURE TRAY) ×2 IMPLANT
PAD ARMBOARD 7.5X6 YLW CONV (MISCELLANEOUS) ×2 IMPLANT
PENCIL HANDSWITCHING (ELECTRODE) ×1 IMPLANT
POUCH SPECIMEN RETRIEVAL 10MM (ENDOMECHANICALS) ×2 IMPLANT
RELOAD /EVU35 (ENDOMECHANICALS) IMPLANT
RELOAD 45 VASCULAR/THIN (ENDOMECHANICALS) IMPLANT
RELOAD CUTTER ETS 35MM STAND (ENDOMECHANICALS) IMPLANT
RELOAD STAPLE 45 2.5 WHT GRN (ENDOMECHANICALS) IMPLANT
SCALPEL HARMONIC ACE (MISCELLANEOUS) ×2 IMPLANT
SET BASIN LINEN APH (SET/KITS/TRAYS/PACK) ×2 IMPLANT
SET TUBE IRRIG SUCTION NO TIP (IRRIGATION / IRRIGATOR) ×1 IMPLANT
SPONGE DRAIN TRACH 4X4 STRL 2S (GAUZE/BANDAGES/DRESSINGS) ×1 IMPLANT
SPONGE GAUZE 2X2 8PLY STRL LF (GAUZE/BANDAGES/DRESSINGS) ×4 IMPLANT
STAPLER VISISTAT (STAPLE) ×2 IMPLANT
SUT ETHILON 3 0 FSL (SUTURE) ×1 IMPLANT
SUT VICRYL 0 UR6 27IN ABS (SUTURE) ×2 IMPLANT
TAPE CLOTH SURG 4X10 WHT LF (GAUZE/BANDAGES/DRESSINGS) ×1 IMPLANT
TRAY FOLEY CATH 14FR (SET/KITS/TRAYS/PACK) ×1 IMPLANT
TROCAR Z-THAD FIOS HNDL 12X100 (TROCAR) ×3 IMPLANT
TROCAR Z-THRD FIOS HNDL 11X100 (TROCAR) ×2 IMPLANT
TROCAR Z-THREAD FIOS 5X100MM (TROCAR) ×2 IMPLANT
WARMER LAPAROSCOPE (MISCELLANEOUS) ×2 IMPLANT
YANKAUER SUCT BULB TIP 10FT TU (MISCELLANEOUS) ×2 IMPLANT

## 2012-04-17 NOTE — Anesthesia Procedure Notes (Signed)
Procedure Name: Intubation Date/Time: 04/17/2012 8:20 AM Performed by: Franco Nones Pre-anesthesia Checklist: Patient identified, Patient being monitored, Timeout performed, Emergency Drugs available and Suction available Patient Re-evaluated:Patient Re-evaluated prior to inductionOxygen Delivery Method: Circle System Utilized Preoxygenation: Pre-oxygenation with 100% oxygen Intubation Type: IV induction, Rapid sequence and Cricoid Pressure applied Ventilation: Mask ventilation without difficulty Laryngoscope Size: Miller and 2 Grade View: Grade I Tube type: Oral Tube size: 7.0 mm Number of attempts: 1 Airway Equipment and Method: stylet Placement Confirmation: ETT inserted through vocal cords under direct vision,  positive ETCO2 and breath sounds checked- equal and bilateral Secured at: 21 cm Tube secured with: Tape Dental Injury: Teeth and Oropharynx as per pre-operative assessment

## 2012-04-17 NOTE — Transfer of Care (Signed)
Immediate Anesthesia Transfer of Care Note  Patient: Travis Woodward  Procedure(s) Performed: Procedure(s) (LRB): APPENDECTOMY LAPAROSCOPIC (N/A)  Patient Location: PACU  Anesthesia Type: General  Level of Consciousness: awake  Airway & Oxygen Therapy: Patient Spontanous Breathing and non-rebreather face mask  Post-op Assessment: Report given to PACU RN, Post -op Vital signs reviewed and stable and Patient moving all extremities  Post vital signs: Reviewed and stable  Complications: No apparent anesthesia complications

## 2012-04-17 NOTE — Addendum Note (Signed)
Addendum  created 04/17/12 1055 by Franco Nones, CRNA   Modules edited:Inpatient Notes

## 2012-04-17 NOTE — Anesthesia Postprocedure Evaluation (Signed)
Anesthesia Post Note  Patient: Travis Woodward  Procedure(s) Performed: Procedure(s) (LRB): APPENDECTOMY LAPAROSCOPIC (N/A)  Anesthesia type: General  Patient location: PACU  Post pain: Pain level controlled  Post assessment: Post-op Vital signs reviewed, Patient's Cardiovascular Status Stable, Respiratory Function Stable, Patent Airway, No signs of Nausea or vomiting and Pain level controlled  Last Vitals:  Filed Vitals:   04/17/12 1002  BP: 104/42  Pulse: 90  Temp: 36.9 C  Resp: 16    Post vital signs: Reviewed and stable  Level of consciousness: awake and alert   Complications: No apparent anesthesia complications
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
## 2012-04-17 NOTE — Progress Notes (Signed)
Patient admitted to Room #304 accompanied by spouse.  He complains of pain only with movement.  He is alert and oriented to person, place, time and reason.  He is aware of the pending surgery scheduled for later this morning by Dr. Lovell Sheehan.  All consents for surgery, anesthesia, and blood are all read, explained and signed by spouse.  Patient is unable to sign due to being medicated for pain.  A bath with soap and water was administered.  Patient also received CHG bath upon admission.  He is NPO status.  Mouth care given.  Patient is stable.  All questions and concerns have been addressed with patient and spouse.

## 2012-04-17 NOTE — ED Provider Notes (Signed)
History     CSN: [id number]  Arrival date & time 04/17/12  0007   First MD Initiated Contact with Patient 04/17/12 0013      Chief Complaint  Patient presents with  . Abdominal Pain  . Chills  . Nausea  . Urinary Retention    (Consider location/radiation/quality/duration/timing/severity/associated sxs/prior treatment) Patient is a 71 y.o. male presenting with abdominal pain. The history is provided by the patient. No language interpreter was used.  Abdominal Pain The primary symptoms of the illness include abdominal pain and nausea. The primary symptoms of the illness do not include fever, fatigue, shortness of breath, vomiting, diarrhea or dysuria. The current episode started 6 to 12 hours ago. The onset of the illness was gradual. The problem has been gradually worsening.  The pain came on gradually. The abdominal pain has been gradually worsening since its onset. The abdominal pain is generalized. The abdominal pain does not radiate. The abdominal pain is relieved by nothing. The abdominal pain is exacerbated by movement.  Nausea began today. The nausea is exacerbated by motion.  Risk factors for an acute abdominal problem include being elderly. Additional symptoms associated with the illness include anorexia and back pain. Symptoms associated with the illness do not include chills, diaphoresis, heartburn, constipation, urgency, hematuria or frequency.    History reviewed. No pertinent past medical history.  History reviewed. No pertinent past surgical history.  No family history on file.  History  Substance Use Topics  . Smoking status: Not on file  . Smokeless tobacco: Not on file  . Alcohol Use: Not on file      Review of Systems  Constitutional: Negative for fever, chills, diaphoresis, activity change, appetite change and fatigue.  HENT: Negative for congestion, sore throat, rhinorrhea, neck pain and neck stiffness.   Respiratory: Negative for cough and shortness of  breath.   Cardiovascular: Negative for chest pain and palpitations.  Gastrointestinal: Positive for nausea, abdominal pain and anorexia. Negative for heartburn, vomiting, diarrhea, constipation and blood in stool.  Genitourinary: Positive for difficulty urinating and testicular pain. Negative for dysuria, urgency, frequency, hematuria, flank pain and scrotal swelling.  Musculoskeletal: Positive for back pain. Negative for myalgias and arthralgias.  Neurological: Negative for dizziness, weakness, light-headedness, numbness and headaches.  All other systems reviewed and are negative.    Allergies  Paroxetine  Home Medications  No current outpatient prescriptions on file.  BP 126/66  Pulse 88  Temp(Src) 102.2 F (39 C) (Oral)  Resp 14  Ht 6\' 2"  (1.88 m)  Wt 220 lb (99.791 kg)  BMI 28.25 kg/m2  SpO2 94%  Physical Exam  Nursing note and vitals reviewed. Constitutional: He is oriented to person, place, and time. He appears well-developed and well-nourished. He appears distressed.  HENT:  Head: Normocephalic and atraumatic.  Mouth/Throat: Oropharynx is clear and moist.  Eyes: Conjunctivae and EOM are normal. Pupils are equal, round, and reactive to light.  Neck: Normal range of motion. Neck supple.  Cardiovascular: Normal rate, regular rhythm, normal heart sounds and intact distal pulses.  Exam reveals no gallop and no friction rub.   No murmur heard. Pulmonary/Chest: Effort normal and breath sounds normal. No respiratory distress. He exhibits no tenderness.  Abdominal: Soft. Bowel sounds are normal. He exhibits no distension. There is tenderness (diffuse significant tenderness). There is guarding (voluntary in lower abdomen). There is no rebound. Hernia confirmed negative in the right inguinal area and confirmed negative in the left inguinal area.  Genitourinary: Penis normal. Right testis  shows no mass and no tenderness. Left testis shows no mass and no tenderness.  Musculoskeletal:  Normal range of motion. He exhibits no edema and no tenderness.  Neurological: He is alert and oriented to person, place, and time. No cranial nerve deficit.  Skin: Skin is warm and dry. No rash noted.    ED Course  Procedures (including critical care time)   Date: 04/17/2012  Rate: 92  Rhythm: normal sinus rhythm  QRS Axis: normal  Intervals: normal  ST/T Wave abnormalities: normal  Conduction Disutrbances:none  Narrative Interpretation:   Old EKG Reviewed: unchanged  Labs Reviewed  CBC - Abnormal; Notable for the following:    RBC 3.89 (*)    Hemoglobin 11.9 (*)    HCT 36.4 (*)    Platelets 120 (*)    All other components within normal limits  DIFFERENTIAL - Abnormal; Notable for the following:    Monocytes Relative 2 (*)    Basophils Relative 2 (*)    All other components within normal limits  COMPREHENSIVE METABOLIC PANEL - Abnormal; Notable for the following:    Sodium 132 (*)    Glucose, Bld 159 (*)    Total Bilirubin 1.6 (*)    GFR calc non Af Amer 65 (*)    GFR calc Af Amer 75 (*)    All other components within normal limits  URINALYSIS, ROUTINE W REFLEX MICROSCOPIC - Abnormal; Notable for the following:    Specific Gravity, Urine >1.030 (*)    Hgb urine dipstick MODERATE (*)    All other components within normal limits  PROTIME-INR - Abnormal; Notable for the following:    Prothrombin Time 15.3 (*)    All other components within normal limits  URINE MICROSCOPIC-ADD ON - Abnormal; Notable for the following:    Bacteria, UA MANY (*)    All other components within normal limits  LIPASE, BLOOD  LACTIC ACID, PLASMA  URINE CULTURE   Ct Abdomen Pelvis W Contrast  04/17/2012  *RADIOLOGY REPORT*  Clinical Data: Right sided flank pain and hematuria.  CT ABDOMEN AND PELVIS WITH CONTRAST  Technique:  Multidetector CT imaging of the abdomen and pelvis was performed following the standard protocol during bolus administration of intravenous contrast.  Contrast:  OMNIPAQUE IOHEXOL 300 MG/ML  SOLN  Comparison: None.  Findings: Dependent atelectasis in the lung bases.  Small focal area of infiltration in the right lung base is not excluded.  Small of fluid around the right liver edge and extending into the right pericolic gutter.  The gallbladder is mildly distended without pericholecystic edema.  Mild fatty infiltration of the pancreas.  The liver, spleen, adrenal glands, kidneys, and retroperitoneal lymph nodes are unremarkable.  Calcification of the abdominal aorta without aneurysm.  Portal veins and mesenteric vessels appear patent.  Celiac axis lymph nodes are not pathologically enlarged.  The stomach is normal without gastric wall thickening.  Small bowel are decompressed.  Gas and stool in the colon without significant wall thickening.  Pelvis:  There is inflammatory process in the right lower quadrant extending from the right pelvis up into the right pericolic gutter region.  This appears to be surrounding a abnormally distended appendix with appendicolith.  Changes are consistent with acute appendicitis.  No focal abscess.  There is free fluid in the pelvis.  There are focal extraluminal gas collections in the mesenteric fat.  The bladder is decompressed with Foley catheter. No evidence of diverticulitis.  Pelvic lymph nodes are not pathologically enlarged.  Degenerative  changes in the lumbar spine. Compression deformity of T12 with prior kyphoplasty change.  IMPRESSION: Right lower quadrant inflammatory process with pelvic fluid extending up into the abdomen and infiltrating extraluminal gas in the pelvic fat. Distended appendix.  Appendicolith. Changes consistent with perforated appendicitis.  Results discussed by telephone at the time of dictation, 0150 hours on 04/17/2012 with Dr. Brooke Dare.  Original Report Authenticated By: Marlon Pel, M.D.     1. Perforated appendicitis       MDM  Perforated appendicitis. The patient was placed on Invanz. IV fluids  and pain management. Discussed with Dr. Lovell Sheehan who will evaluate the patient for admission        Dayton Bailiff, MD 04/17/12 [phone]
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
## 2012-04-17 NOTE — ED Notes (Signed)
Dr. Lovell Sheehan phoned and stated he will be in to see patient around 0700 and that or team will be called in and patient is to receive fluids and antibiotics

## 2012-04-17 NOTE — Addendum Note (Signed)
Addendum  created 04/17/12 1153 by Franco Nones, CRNA   Modules edited:Anesthesia Flowsheet

## 2012-04-17 NOTE — Op Note (Signed)
Patient:  MECHEL SCHUTTER  DOB:  1941-11-03  MRN:  [id number]   Preop Diagnosis:  Perforated appendicitis  Postop Diagnosis:  Same  Procedure:  Laparoscopic appendectomy  Surgeon:  Franky Macho, M.D.  Anes:  General endotracheal  Indications:  Patient is a 71 year old white male presents with a 24-hour history of worsening right lower quadrant abdominal pain. CT scan the abdomen and pelvis reveals perforated acute appendicitis with localized perforation no abscess cavity present. He does not have free air in the perineum. The risks and benefits of the procedure including bleeding, infection, possibly of an open procedure were fully explained to the patient, gave informed consent.  Procedure note:  Patient is placed the supine position. After induction of general endotracheal anesthesia, the abdomen was prepped and draped using the usual sterile technique with DuraPrep. Surgical site confirmation was performed.  A supraumbilical incision was made down to the fascia. A Veress needle was introduced into the abdominal cavity and confirmation of placement was done using the saline drop test. The abdomen was then insufflated to 16 mm mercury pressure. An 11 mm trocar was introduced into the abdominal cavity under direct visualization without difficulty. The patient was placed in deeper Trendelenburg position and an additional 12 mm trocar was placed the suprapubic region and a 5 mm trocar was placed in left lower corner region. Ultimately, this 5 mm trocar site had to be switched to 12 mm trocar to facilitate the dissection. The abdominal cavity was inspected and by mouth fluid was noted within the pelvis. Localized peritoneal signs lungs surface of the small intestine as well as abdominal wall were noted in the pelvic region. The appendix was ultimately found to be adherent along the right pelvic wall. Was freed away. The perforation with a stool ball was noted approximately half way down the appendix.  Using the harmonic scalpel. The dissection was taken up to the appendiceal origin of the cecum. A vascular Endo GIA was placed across the base the appendix and fired. The appendix and stool ball were removed using an Endo Catch bag and sent to pathology further examination. The pelvis and right lower quadrant with copious irrigated normal saline. A #10 flat Jackson-Pratt drain was placed into the pelvis and breath through the left lower quadrant trocar site. It was secured at the skin level using 3-0 nylon interrupted suture. All fascial incisions were closed using 0 Vicryl interrupted sutures. All skin incisions were closed using staples. Betadine ointment after dressings were applied. 0.5% Sensorcaine was instilled the surrounding incisions.  All tape and needle counts were correct the end of the procedure. Patient was extubated in the operating room went back recovery room in guarded but stable condition.  Complications:  None  EBL:  50 cc  Specimen:  Appendix  Drain: JP drain to pelvis

## 2012-04-18 LAB — MAGNESIUM: Magnesium: 2.4 mg/dL (ref 1.5–2.5)

## 2012-04-18 LAB — CBC
MCH: 30.7 pg (ref 26.0–34.0)
MCHC: 32.5 g/dL (ref 30.0–36.0)
Platelets: 101 10*3/uL — ABNORMAL LOW (ref 150–400)

## 2012-04-18 LAB — BASIC METABOLIC PANEL
BUN: 20 mg/dL (ref 6–23)
Calcium: 8.9 mg/dL (ref 8.4–10.5)
Creatinine, Ser: 1.14 mg/dL (ref 0.50–1.35)
GFR calc Af Amer: 73 mL/min — ABNORMAL LOW (ref >90)
GFR calc non Af Amer: 63 mL/min — ABNORMAL LOW (ref >90)

## 2012-04-18 LAB — URINE CULTURE
Colony Count: NO GROWTH
Culture  Setup Time: 201304212103

## 2012-04-18 MED ORDER — DOCUSATE SODIUM 100 MG PO CAPS
100.0000 mg | ORAL_CAPSULE | Freq: Two times a day (BID) | ORAL | Status: DC
  Administered 2012-04-18 – 2012-04-20 (×5): 100 mg via ORAL
  Filled 2012-04-18 (×5): qty 1

## 2012-04-18 MED ORDER — ACETAMINOPHEN 10 MG/ML IV SOLN
1000.0000 mg | Freq: Four times a day (QID) | INTRAVENOUS | Status: AC
  Administered 2012-04-18 – 2012-04-19 (×4): 1000 mg via INTRAVENOUS
  Filled 2012-04-18 (×4): qty 100

## 2012-04-18 MED ORDER — SODIUM CHLORIDE 0.9 % IJ SOLN
INTRAMUSCULAR | Status: AC
  Filled 2012-04-18: qty 3

## 2012-04-18 NOTE — Progress Notes (Signed)
   CARE MANAGEMENT NOTE 04/18/2012  Patient:  Travis Woodward,Travis Woodward   Account Number:  [id number]  Date Initiated:  04/18/2012  Documentation initiated by:  Zachery Niswander  Subjective/Objective Assessment:   Admitted from home with perforated appendix. Was taken to surgery for Lap Appendectomy. Post -op  day 1 today. Pt lives at home with spouse,  and will return home at D/C     Action/Plan:   No needs identified, but will follow for possible needs at D/C   Anticipated DC Date:  04/19/2012   Anticipated DC Plan:  HOME/SELF CARE      DC Planning Services  CM consult      Choice offered to / List presented to:             Status of service:  In process, will continue to follow Medicare Important Message given?   (If response is "NO", the following Medicare IM given date fields will be blank) Date Medicare IM given:   Date Additional Medicare IM given:    Discharge Disposition:    Per UR Regulation:  Reviewed for med. necessity/level of care/duration of stay  If discussed at Long Length of Stay Meetings, dates discussed:    Comments:  04/18/12 1350 Oletta Buehring RN    

## 2012-04-18 NOTE — Progress Notes (Signed)
1 Day Post-Op  Subjective: Feels better this morning. Has mild incisional pain.  Objective: Vital signs in last 24 hours: Temp:  [98 F (36.7 C)-100 F (37.8 C)] 98.9 F (37.2 C) (04/22 0541) Pulse Rate:  [85-100] 90  (04/22 0541) Resp:  [16-22] 20  (04/22 0541) BP: (99-123)/(33-71) 123/71 mmHg (04/22 0541) SpO2:  [92 %-100 %] 95 % (04/22 0541) Last BM Date: 04/15/12  Intake/Output from previous day: 04/21 0701 - 04/22 0700 In: 1900 [I.V.:1900] Out: 4015 [Urine:2950; Drains:215; Blood:50] Intake/Output this shift: Total I/O In: 200 [P.O.:200] Out: 30 [Drains:30]  General appearance: alert, cooperative and no distress Resp: clear to auscultation bilaterally Cardio: regular rate and rhythm, S1, S2 normal, no murmur, click, rub or gallop GI: Soft. Dressings dry and intact. Jackson-Pratt drain with serosanguineous drainage.  Lab Results:   [facility] 04/18/12 0459 04/17/12 0026  WBC 7.2 4.9  HGB 10.5* 11.9*  HCT 32.3* 36.4*  PLT 101* 120*   BMET  Basename 04/18/12 0459 04/17/12 0026  NA 134* 132*  K 4.6 4.1  CL 102 100  CO2 27 21  GLUCOSE 102* 159*  BUN 20 16  CREATININE 1.14 1.11  CALCIUM 8.9 9.3   PT/INR  Basename 04/17/12 0026  LABPROT 15.3*  INR 1.18    Studies/Results: Ct Abdomen Pelvis W Contrast  04/17/2012  *RADIOLOGY REPORT*  Clinical Data: Right sided flank pain and hematuria.  CT ABDOMEN AND PELVIS WITH CONTRAST  Technique:  Multidetector CT imaging of the abdomen and pelvis was performed following the standard protocol during bolus administration of intravenous contrast.  Contrast: OMNIPAQUE IOHEXOL 300 MG/ML  SOLN  Comparison: None.  Findings: Dependent atelectasis in the lung bases.  Small focal area of infiltration in the right lung base is not excluded.  Small of fluid around the right liver edge and extending into the right pericolic gutter.  The gallbladder is mildly distended without pericholecystic edema.  Mild fatty infiltration of the  pancreas.  The liver, spleen, adrenal glands, kidneys, and retroperitoneal lymph nodes are unremarkable.  Calcification of the abdominal aorta without aneurysm.  Portal veins and mesenteric vessels appear patent.  Celiac axis lymph nodes are not pathologically enlarged.  The stomach is normal without gastric wall thickening.  Small bowel are decompressed.  Gas and stool in the colon without significant wall thickening.  Pelvis:  There is inflammatory process in the right lower quadrant extending from the right pelvis up into the right pericolic gutter region.  This appears to be surrounding a abnormally distended appendix with appendicolith.  Changes are consistent with acute appendicitis.  No focal abscess.  There is free fluid in the pelvis.  There are focal extraluminal gas collections in the mesenteric fat.  The bladder is decompressed with Foley catheter. No evidence of diverticulitis.  Pelvic lymph nodes are not pathologically enlarged.  Degenerative changes in the lumbar spine. Compression deformity of T12 with prior kyphoplasty change.  IMPRESSION: Right lower quadrant inflammatory process with pelvic fluid extending up into the abdomen and infiltrating extraluminal gas in the pelvic fat. Distended appendix.  Appendicolith. Changes consistent with perforated appendicitis.  Results discussed by telephone at the time of dictation, 0150 hours on 04/17/2012 with Dr. Brooke Dare.  Original Report Authenticated By: Marlon Pel, M.D.   Dg Chest Port 1 View  04/17/2012  *RADIOLOGY REPORT*  Clinical Data: Perforated appendix.  PORTABLE CHEST - 1 VIEW  Comparison: 10/16/2008  Findings: Shallow inspiration with elevation of the right hemidiaphragm.  Suggestion of mild infiltration  or atelectasis in the right lung base.  The normal heart size and pulmonary vascularity for technique.  No blunting of costophrenic angles.  No pneumothorax.  IMPRESSION: Elevation right hemidiaphragm.  Linear infiltration or atelectasis  in the right lung base.  Original Report Authenticated By: Marlon Pel, M.D.    Anti-infectives: Anti-infectives     Start     Dose/Rate Route Frequency Ordered Stop   04/18/12 0000   ertapenem (INVANZ) 1 g in sodium chloride 0.9 % 50 mL IVPB        1 g 100 mL/hr over 30 Minutes Intravenous Every 24 hours 04/17/12 1155     04/17/12 0245   ertapenem (INVANZ) 1 g in sodium chloride 0.9 % 50 mL IVPB        1 g 100 mL/hr over 30 Minutes Intravenous  Once 04/17/12 0241 04/17/12 0353          Assessment/Plan: s/p Procedure(s): APPENDECTOMY LAPAROSCOPIC Impression: Stable, status post laparoscopic appendectomy for perforated appendicitis. Plan: Nelle Don to full liquid diet. Will get patient out of bed. Continue to monitor laboratory values.  LOS: 1 day    Stefan Markarian A 04/18/2012

## 2012-04-18 NOTE — Progress Notes (Signed)
   CARE MANAGEMENT NOTE 04/18/2012  Patient:  Travis Woodward, Travis Woodward   Account Number:  [id number]  Date Initiated:  04/18/2012  Documentation initiated by:  Anibal Henderson  Subjective/Objective Assessment:   Admitted from home with perforated appendix. Was taken to surgery for Lap Appendectomy. Post -op  day 1 today. Pt lives at home with spouse,  and will return home at D/C     Action/Plan:   No needs identified, but will follow for possible needs at D/C   Anticipated DC Date:  04/19/2012   Anticipated DC Plan:  HOME/SELF CARE      DC Planning Services  CM consult      Choice offered to / List presented to:             Status of service:  In process, will continue to follow Medicare Important Message given?   (If response is "NO", the following Medicare IM given date fields will be blank) Date Medicare IM given:   Date Additional Medicare IM given:    Discharge Disposition:    Per UR Regulation:  Reviewed for med. necessity/level of care/duration of stay  If discussed at Long Length of Stay Meetings, dates discussed:    Comments:  04/18/12 1350 Anibal Henderson RN

## 2012-04-18 NOTE — Progress Notes (Signed)
UR Chart Review Completed  

## 2012-04-18 NOTE — Addendum Note (Signed)
Addendum  created 04/18/12 1411 by Franco Nones, CRNA   Modules edited:Notes Section

## 2012-04-18 NOTE — Anesthesia Postprocedure Evaluation (Signed)
Anesthesia Post Note  Patient: Travis Woodward  Procedure(s) Performed: Procedure(s) (LRB): APPENDECTOMY LAPAROSCOPIC (N/A)  Anesthesia type: General  Patient location: 304  Post pain: Pain level controlled  Post assessment: Post-op Vital signs reviewed, Patient's Cardiovascular Status Stable, Respiratory Function Stable, Patent Airway, No signs of Nausea or vomiting and Pain level controlled  Last Vitals:  Filed Vitals:   04/18/12 0541  BP: 123/71  Pulse: 90  Temp: 37.2 C  Resp: 20    Post vital signs: Reviewed and stable  Level of consciousness: awake and alert   Complications: No apparent anesthesia complications

## 2012-04-19 ENCOUNTER — Encounter (HOSPITAL_COMMUNITY): Payer: Self-pay | Admitting: General Surgery

## 2012-04-19 LAB — BASIC METABOLIC PANEL
BUN: 17 mg/dL (ref 6–23)
CO2: 26 mEq/L (ref 19–32)
Chloride: 103 mEq/L (ref 96–112)
Creatinine, Ser: 1 mg/dL (ref 0.50–1.35)
Glucose, Bld: 88 mg/dL (ref 70–99)

## 2012-04-19 LAB — CBC
HCT: 30.5 % — ABNORMAL LOW (ref 39.0–52.0)
MCV: 94.7 fL (ref 78.0–100.0)
RBC: 3.22 MIL/uL — ABNORMAL LOW (ref 4.22–5.81)
WBC: 8.2 10*3/uL (ref 4.0–10.5)

## 2012-04-19 MED ORDER — SODIUM PHOSPHATE 3 MMOLE/ML IV SOLN
30.0000 mmol | Freq: Once | INTRAVENOUS | Status: AC
  Administered 2012-04-19: 30 mmol via INTRAVENOUS
  Filled 2012-04-19: qty 10

## 2012-04-19 NOTE — Consult Note (Signed)
MEDICATION RELATED CONSULT NOTE - INITIAL   Pharmacy Consult for Phosphorous replacement  Indication: hypophosphatemia  Allergies  Allergen Reactions  . Paroxetine Rash   Patient Measurements: Height: 6\' 2"  (188 cm) Weight: 207 lb 0.2 oz (93.9 kg) IBW/kg (Calculated) : 82.2   Vital Signs: Temp: 99.1 F (37.3 C) (04/23 0610) Temp src: Oral (04/23 0610) BP: 124/73 mmHg (04/23 0610) Pulse Rate: 83  (04/23 0610) Intake/Output from previous day: 04/22 0701 - 04/23 0700 In: 1770 [P.O.:720; I.V.:950; IV Piggyback:100] Out: 2950 [Urine:2875; Drains:75] Intake/Output from this shift: Total I/O In: -  Out: 10 [Drains:10]  Labs:  [facility] 04/19/12 0516 04/18/12 0459 04/17/12 0026  WBC 8.2 7.2 4.9  HGB 9.7* 10.5* 11.9*  HCT 30.5* 32.3* 36.4*  PLT 119* 101* 120*  APTT -- -- --  CREATININE 1.00 1.14 1.11  LABCREA -- -- --  CREATININE 1.00 1.14 1.11  CREAT24HRUR -- -- --  MG 2.3 2.4 --  PHOS 1.8* 2.1* --  ALBUMIN -- -- 3.6  PROT -- -- 7.2  ALBUMIN -- -- 3.6  AST -- -- 12  ALT -- -- 11  ALKPHOS -- -- 76  BILITOT -- -- 1.6*  BILIDIR -- -- --  IBILI -- -- --   Estimated Creatinine Clearance: 78.8 ml/min (by C-G formula based on Cr of 1).  Microbiology: Recent Results (from the past 720 hour(s))  URINE CULTURE     Status: Normal   Collection Time   04/17/12 12:55 AM      Component Value Range Status Comment   Specimen Description URINE, CATHETERIZED   Final    Special Requests NONE   Final    Culture  Setup Time [id number]   Final    Colony Count NO GROWTH   Final    Culture NO GROWTH   Final    Report Status 04/18/2012 FINAL   Final   MRSA PCR SCREENING     Status: Normal   Collection Time   04/17/12  6:06 AM      Component Value Range Status Comment   MRSA by PCR NEGATIVE  NEGATIVE  Final    Medications:  Scheduled:    . acetaminophen  1,000 mg Intravenous Q6H  . docusate sodium  100 mg Oral BID  . enoxaparin  40 mg Subcutaneous Q24H  . ertapenem (INVANZ)  IV  1 g Intravenous Q24H  . pantoprazole  40 mg Oral Q1200  . sodium chloride      . sodium phosphate  Dextrose 5% IVPB  30 mmol Intravenous Once   Assessment: hypophosphatemia  Goal of Therapy:  Replenish PO4 to WNL  Plan:  Sodium Phosphates over 6 hours x 1 today Re-check labs tomorrow, K and PO4  Valrie Hart A 04/19/2012,11:22 AM

## 2012-04-19 NOTE — Progress Notes (Signed)
2 Days Post-Op  Subjective: Patient had a bowel movement this morning. Has been advanced to a regular diet. Incisional pain much less.  Objective: Vital signs in last 24 hours: Temp:  [97.6 F (36.4 C)-99.1 F (37.3 C)] 99.1 F (37.3 C) (04/23 0610) Pulse Rate:  [83-104] 83  (04/23 0610) Resp:  [20] 20  (04/23 0610) BP: (114-126)/(66-73) 124/73 mmHg (04/23 0610) SpO2:  [94 %-99 %] 94 % (04/23 0610) Last BM Date: 04/15/12  Intake/Output from previous day: 04/22 0701 - 04/23 0700 In: 1770 [P.O.:720; I.V.:950; IV Piggyback:100] Out: 2950 [Urine:2875; Drains:75] Intake/Output this shift: Total I/O In: -  Out: 10 [Drains:10]  General appearance: alert, cooperative and no distress Resp: clear to auscultation bilaterally Cardio: regular rate and rhythm, S1, S2 normal, no murmur, click, rub or gallop GI: Soft, flat. Incisions healing well. JP drainage serosanguineous in nature.  Lab Results:   [facility] 04/19/12 0516 04/18/12 0459  WBC 8.2 7.2  HGB 9.7* 10.5*  HCT 30.5* 32.3*  PLT 119* 101*   BMET  Basename 04/19/12 0516 04/18/12 0459  NA 136 134*  K 4.5 4.6  CL 103 102  CO2 26 27  GLUCOSE 88 102*  BUN 17 20  CREATININE 1.00 1.14  CALCIUM 9.0 8.9   PT/INR  Basename 04/17/12 0026  LABPROT 15.3*  INR 1.18    Studies/Results: No results found.  Anti-infectives: Anti-infectives     Start     Dose/Rate Route Frequency Ordered Stop   04/18/12 0000   ertapenem (INVANZ) 1 g in sodium chloride 0.9 % 50 mL IVPB        1 g 100 mL/hr over 30 Minutes Intravenous Every 24 hours 04/17/12 1155     04/17/12 0245   ertapenem (INVANZ) 1 g in sodium chloride 0.9 % 50 mL IVPB        1 g 100 mL/hr over 30 Minutes Intravenous  Once 04/17/12 0241 04/17/12 0353          Assessment/Plan: s/p Procedure(s): APPENDECTOMY LAPAROSCOPIC Impression: Recovering well, status post laparoscopic appendectomy for perforated appendicitis. He is noted be hypophosphatemic and this will be  treated by pharmacy. His diet has just been recently advanced. Anticipate discharge in next 24-48 hours.  LOS: 2 days    Travis Woodward A 04/19/2012

## 2012-04-20 LAB — BASIC METABOLIC PANEL
CO2: 27 mEq/L (ref 19–32)
Calcium: 9.3 mg/dL (ref 8.4–10.5)
Creatinine, Ser: 1.02 mg/dL (ref 0.50–1.35)
Glucose, Bld: 91 mg/dL (ref 70–99)
Sodium: 142 mEq/L (ref 135–145)

## 2012-04-20 LAB — CBC
Hemoglobin: 10.9 g/dL — ABNORMAL LOW (ref 13.0–17.0)
MCH: 29.8 pg (ref 26.0–34.0)
MCV: 94.3 fL (ref 78.0–100.0)
RBC: 3.66 MIL/uL — ABNORMAL LOW (ref 4.22–5.81)

## 2012-04-20 MED ORDER — AMOXICILLIN-POT CLAVULANATE 875-125 MG PO TABS: 1.0000 | ORAL_TABLET | Freq: Two times a day (BID) | ORAL | Status: AC

## 2012-04-20 NOTE — Discharge Instructions (Signed)
Laparoscopic Appendectomy Care After Refer to this sheet in the next few weeks. These instructions provide you with information on caring for yourself after your procedure. Your caregiver may also give you more specific instructions. Your treatment has been planned according to current medical practices, but problems sometimes occur. Call your caregiver if you have any problems or questions after your procedure. HOME CARE INSTRUCTIONS  Do not drive while taking narcotic pain medicines.   Use stool softener if you become constipated from your pain medicines.   Change your bandages (dressings) as directed.   Keep your wounds clean and dry. You may wash the wounds gently with soap and water. Gently pat the wounds dry with a clean towel.   Do not take baths, swim, or use hot tubs for 10 days, or as instructed by your caregiver.   Only take over-the-counter or prescription medicines for pain, discomfort, or fever as directed by your caregiver.   You may continue your normal diet as directed.   Do not lift more than 10 pounds (4.5 kg) or play contact sports for 3 weeks, or as directed.   Slowly increase your activity after surgery.   Take deep breaths to avoid getting a lung infection (pneumonia).  SEEK MEDICAL CARE IF:  You have redness, swelling, or increasing pain in your wounds.   You have pus coming from your wounds.   You have drainage from a wound that lasts longer than 1 day.   You notice a bad smell coming from the wounds or dressing.   Your wound edges break open after stitches (sutures) have been removed.   You notice increasing pain in the shoulders (shoulder strap areas) or near your shoulder blades.   You develop dizzy episodes or fainting while standing.   You develop shortness of breath.   You develop persistent nausea or vomiting.   You cannot control your bowel functions or lose your appetite.   You develop diarrhea.  SEEK IMMEDIATE MEDICAL CARE IF:   You  have a fever.   You develop a rash.   You have difficulty breathing or sharp pains in your chest.   You develop any reaction or side effects to medicines given.  MAKE SURE YOU:  Understand these instructions.   Will watch your condition.   Will get help right away if you are not doing well or get worse.  Document Released: 12/14/2005 Document Revised: 12/03/2011 Document Reviewed: 06/23/2011 ExitCare Patient Information 2012 [employer]. 

## 2012-04-20 NOTE — Progress Notes (Signed)
Patient left with wife via personal vehicle after explaining discharge instructions and giving prescriptions. Patient understanding of how and when to take medications. Clarified with Dr. Lovell Sheehan if patient could resume aspirin today. MD agreeable. Patient aware. Patient left in stable condition. Wheeled out of facility by staff.

## 2012-04-20 NOTE — Discharge Summary (Signed)
Physician Discharge Summary  Patient ID: Travis Woodward MRN: [id number] DOB/AGE: 1941-10-29 71 y.o.  Admit date: 04/17/2012 Discharge date: 04/20/2012  Admission Diagnoses: Perforated acute appendicitis  Discharge Diagnoses: Same Active Problems:  * No active hospital problems. *    Discharged Condition: good  Hospital Course: Patient is a 71 year old white male who presented to the emergency room on 04/17/2012 with worsening lower abdominal pain. CT scan the abdomen revealed acute appendicitis with localized perforation. He was taken to the operating room and underwent laparoscopic appendectomy. He tolerated the procedure well. Postoperative course has been for the most part unremarkable. He was treated for hypophosphatemia. He did have a drain in place in the pelvis which has been pulled on the day of discharge. He is being discharged home on postoperative day 3 in good and improving condition.  Treatments: surgery: Laparoscopic appendectomy on 04/17/2012  Discharge Exam: Blood pressure 137/75, pulse 80, temperature 97.9 F (36.6 C), temperature source Oral, resp. rate 20, height 6\' 2"  (1.88 m), weight 93.9 kg (207 lb 0.2 oz), SpO2 90.00%. General appearance: alert and cooperative Resp: clear to auscultation bilaterally Cardio: regular rate and rhythm, S1, S2 normal, no murmur, click, rub or gallop GI: Soft, flat. Incisions healing well. JP drain removed.  Disposition: Home   Medication List  As of 04/20/2012 11:31 AM   TAKE these medications         amoxicillin-clavulanate 875-125 MG per tablet   Commonly known as: AUGMENTIN   Take 1 tablet by mouth 2 (two) times daily.      clopidogrel 75 MG tablet   Commonly known as: PLAVIX   Take 75 mg by mouth daily.      oxyCODONE 15 MG immediate release tablet   Commonly known as: ROXICODONE   Take 15 mg by mouth every 4 (four) hours as needed. pain           Follow-up Information    Follow up with Dalia Heading, MD. Schedule  an appointment as soon as possible for a visit on 04/26/2012.   Contact information:   [address] [place] [address] [phone]          Signed: Dalia Heading 04/20/2012, 11:31 AM

## 2012-04-20 NOTE — Consult Note (Signed)
MEDICATION RELATED CONSULT NOTE - F/U  Pharmacy Consult for Phosphorous replacement  Indication: hypophosphatemia  Allergies  Allergen Reactions  . Paroxetine Rash   Patient Measurements: Height: 6\' 2"  (188 cm) Weight: 207 lb 0.2 oz (93.9 kg) IBW/kg (Calculated) : 82.2   Vital Signs: Temp: 97.9 F (36.6 C) (04/24 0515) Temp src: Oral (04/24 0515) BP: 137/75 mmHg (04/24 0515) Pulse Rate: 80  (04/24 0515) Intake/Output from previous day: 04/23 0701 - 04/24 0700 In: 840 [P.O.:840] Out: 840 [Urine:825; Drains:15] Intake/Output from this shift:    Labs:  Basename 04/20/12 0501 04/19/12 0516 04/18/12 0459  WBC 9.5 8.2 7.2  HGB 10.9* 9.7* 10.5*  HCT 34.5* 30.5* 32.3*  PLT 172 119* 101*  APTT -- -- --  CREATININE 1.02 1.00 1.14  LABCREA -- -- --  CREATININE 1.02 1.00 1.14  CREAT24HRUR -- -- --  MG -- 2.3 2.4  PHOS 2.3 1.8* 2.1*  ALBUMIN -- -- --  PROT -- -- --  ALBUMIN -- -- --  AST -- -- --  ALT -- -- --  ALKPHOS -- -- --  BILITOT -- -- --  BILIDIR -- -- --  IBILI -- -- --   Estimated Creatinine Clearance: 77.2 ml/min (by C-G formula based on Cr of 1.02).  Microbiology: Recent Results (from the past 720 hour(s))  URINE CULTURE     Status: Normal   Collection Time   04/17/12 12:55 AM      Component Value Range Status Comment   Specimen Description URINE, CATHETERIZED   Final    Special Requests NONE   Final    Culture  Setup Time [id number]   Final    Colony Count NO GROWTH   Final    Culture NO GROWTH   Final    Report Status 04/18/2012 FINAL   Final   MRSA PCR SCREENING     Status: Normal   Collection Time   04/17/12  6:06 AM      Component Value Range Status Comment   MRSA by PCR NEGATIVE  NEGATIVE  Final    Medications:  Scheduled:     . docusate sodium  100 mg Oral BID  . enoxaparin  40 mg Subcutaneous Q24H  . ertapenem (INVANZ) IV  1 g Intravenous Q24H  . pantoprazole  40 mg Oral Q1200  . sodium chloride      . sodium phosphate  Dextrose  5% IVPB  30 mmol Intravenous Once   Assessment: Hypophosphatemia resolved  Goal of Therapy:  Replenish PO4 to WNL  Plan:  No additional IV phosphorous today Monitor labs  Valrie Hart A 04/20/2012,8:19 AM

## 2012-04-29 DIAGNOSIS — I10 Essential (primary) hypertension: Secondary | ICD-10-CM | POA: Diagnosis not present

## 2012-04-29 DIAGNOSIS — E785 Hyperlipidemia, unspecified: Secondary | ICD-10-CM | POA: Diagnosis not present

## 2012-04-29 DIAGNOSIS — D510 Vitamin B12 deficiency anemia due to intrinsic factor deficiency: Secondary | ICD-10-CM | POA: Diagnosis not present

## 2012-04-29 DIAGNOSIS — K219 Gastro-esophageal reflux disease without esophagitis: Secondary | ICD-10-CM | POA: Diagnosis not present

## 2012-05-03 ENCOUNTER — Emergency Department (HOSPITAL_COMMUNITY)
Admission: EM | Admit: 2012-05-03 | Discharge: 2012-05-04 | Disposition: A | Discharge status: Home / Self Care | Payer: Medicare Other | Attending: Emergency Medicine | Admitting: Emergency Medicine

## 2012-05-03 ENCOUNTER — Emergency Department (HOSPITAL_COMMUNITY): Payer: Medicare Other

## 2012-05-03 ENCOUNTER — Encounter (HOSPITAL_COMMUNITY): Payer: Self-pay | Admitting: Emergency Medicine

## 2012-05-03 DIAGNOSIS — R6883 Chills (without fever): Secondary | ICD-10-CM | POA: Diagnosis not present

## 2012-05-03 DIAGNOSIS — Z5189 Encounter for other specified aftercare: Secondary | ICD-10-CM | POA: Diagnosis not present

## 2012-05-03 DIAGNOSIS — R61 Generalized hyperhidrosis: Secondary | ICD-10-CM | POA: Diagnosis not present

## 2012-05-03 DIAGNOSIS — R110 Nausea: Secondary | ICD-10-CM | POA: Insufficient documentation

## 2012-05-03 DIAGNOSIS — I2510 Atherosclerotic heart disease of native coronary artery without angina pectoris: Secondary | ICD-10-CM | POA: Insufficient documentation

## 2012-05-03 DIAGNOSIS — R5383 Other fatigue: Secondary | ICD-10-CM | POA: Diagnosis not present

## 2012-05-03 DIAGNOSIS — Z7982 Long term (current) use of aspirin: Secondary | ICD-10-CM | POA: Diagnosis not present

## 2012-05-03 DIAGNOSIS — R630 Anorexia: Secondary | ICD-10-CM | POA: Diagnosis not present

## 2012-05-03 DIAGNOSIS — R531 Weakness: Secondary | ICD-10-CM

## 2012-05-03 DIAGNOSIS — Z79899 Other long term (current) drug therapy: Secondary | ICD-10-CM | POA: Insufficient documentation

## 2012-05-03 DIAGNOSIS — R5381 Other malaise: Secondary | ICD-10-CM | POA: Diagnosis not present

## 2012-05-03 HISTORY — DX: Atherosclerotic heart disease of native coronary artery without angina pectoris: I25.10

## 2012-05-03 LAB — COMPREHENSIVE METABOLIC PANEL
Albumin: 3.2 g/dL — ABNORMAL LOW (ref 3.5–5.2)
Alkaline Phosphatase: 122 U/L — ABNORMAL HIGH (ref 39–117)
BUN: 16 mg/dL (ref 6–23)
CO2: 27 mEq/L (ref 19–32)
Chloride: 95 mEq/L — ABNORMAL LOW (ref 96–112)
Creatinine, Ser: 1.3 mg/dL (ref 0.50–1.35)
GFR calc Af Amer: 62 mL/min — ABNORMAL LOW (ref >90)
GFR calc non Af Amer: 54 mL/min — ABNORMAL LOW (ref >90)
Glucose, Bld: 133 mg/dL — ABNORMAL HIGH (ref 70–99)
Total Bilirubin: 0.4 mg/dL (ref 0.3–1.2)

## 2012-05-03 LAB — CBC
HCT: 31.8 % — ABNORMAL LOW (ref 39.0–52.0)
HCT: 30.2 % — ABNORMAL LOW (ref 39.0–52.0)
Hemoglobin: 10.6 g/dL — ABNORMAL LOW (ref 13.0–17.0)
MCH: 29.1 pg (ref 26.0–34.0)
MCV: 90.3 fL (ref 78.0–100.0)
MCV: 90.7 fL (ref 78.0–100.0)
Platelets: 248 10*3/uL (ref 150–400)
RBC: 3.33 MIL/uL — ABNORMAL LOW (ref 4.22–5.81)
RDW: 13.9 % (ref 11.5–15.5)
WBC: 16 10*3/uL — ABNORMAL HIGH (ref 4.0–10.5)

## 2012-05-03 LAB — DIFFERENTIAL
Basophils Absolute: 0 10*3/uL (ref 0.0–0.1)
Basophils Relative: 0 % (ref 0–1)
Eosinophils Absolute: 0.1 10*3/uL (ref 0.0–0.7)
Eosinophils Relative: 1 % (ref 0–5)
Lymphs Abs: 1.4 10*3/uL (ref 0.7–4.0)
Lymphs Abs: 1.9 10*3/uL (ref 0.7–4.0)
Monocytes Absolute: 1.7 10*3/uL — ABNORMAL HIGH (ref 0.1–1.0)
Monocytes Relative: 9 % (ref 3–12)
Monocytes Relative: 12 % (ref 3–12)
Neutro Abs: 13.2 10*3/uL — ABNORMAL HIGH (ref 1.7–7.7)

## 2012-05-03 LAB — URINALYSIS, ROUTINE W REFLEX MICROSCOPIC
Specific Gravity, Urine: 1.018 (ref 1.005–1.030)
pH: 5.5 (ref 5.0–8.0)

## 2012-05-03 LAB — LACTIC ACID, PLASMA: Lactic Acid, Venous: 2.6 mmol/L — ABNORMAL HIGH (ref 0.5–2.2)

## 2012-05-03 LAB — URINE MICROSCOPIC-ADD ON

## 2012-05-03 LAB — BASIC METABOLIC PANEL
BUN: 17 mg/dL (ref 6–23)
Calcium: 9.3 mg/dL (ref 8.4–10.5)
Creatinine, Ser: 1.29 mg/dL (ref 0.50–1.35)
GFR calc non Af Amer: 54 mL/min — ABNORMAL LOW (ref >90)
Glucose, Bld: 126 mg/dL — ABNORMAL HIGH (ref 70–99)
Sodium: 134 mEq/L — ABNORMAL LOW (ref 135–145)

## 2012-05-03 LAB — PROTIME-INR
INR: 1.13 (ref 0.00–1.49)
Prothrombin Time: 14.7 seconds (ref 11.6–15.2)

## 2012-05-03 MED ORDER — SODIUM CHLORIDE 0.9 % IV BOLUS (SEPSIS)
500.0000 mL | Freq: Once | INTRAVENOUS | Status: AC
  Administered 2012-05-03: 500 mL via INTRAVENOUS

## 2012-05-03 MED ORDER — ONDANSETRON HCL 4 MG/2ML IJ SOLN
4.0000 mg | INTRAMUSCULAR | Status: DC | PRN
  Administered 2012-05-03: 4 mg via INTRAVENOUS
  Filled 2012-05-03: qty 2

## 2012-05-03 NOTE — ED Notes (Signed)
Scanned pt.s bladder. 164cc of urine noted . Pt. Is attempting to void at present time.  Denies any pain

## 2012-05-03 NOTE — ED Notes (Signed)
Pt resting in stretcher with wife.  Denies any needs at this time.

## 2012-05-03 NOTE — ED Notes (Signed)
2 weeks ago had appy  At [facility] and  Has been weak and  Has  Not been eating family states pt has been sweating and hallunciantions

## 2012-05-03 NOTE — ED Notes (Signed)
Pt states that he has been having cramps in his LUQ today. States that these resolve if he changes position.

## 2012-05-03 NOTE — ED Notes (Signed)
Lab at bedside

## 2012-05-03 NOTE — ED Notes (Signed)
Phlebotomy at bedside drawing blood cultures.  

## 2012-05-03 NOTE — ED Notes (Signed)
Pt denies any needs at this time.  Pt and wife resting in stretcher in NAD, respirations even and unlabored.

## 2012-05-03 NOTE — ED Notes (Signed)
PA at bedside.

## 2012-05-03 NOTE — ED Notes (Signed)
Pt knows we need a urine sample but is unable at this time 

## 2012-05-03 NOTE — ED Notes (Signed)
Pt reports feeling fine after eating dinner.  Pt denies any needs at this time.

## 2012-05-03 NOTE — ED Provider Notes (Signed)
History     CSN: [id number]  Arrival date & time 05/03/12  1253   First MD Initiated Contact with Patient 05/03/12 1414      Chief Complaint  Patient presents with  . Weakness    (Consider location/radiation/quality/duration/timing/severity/associated sxs/prior treatment) The history is provided by the patient and the spouse.   71 y/o M with PMH CAD and lap appy 3 weeks ago at [facility] presents to ED with c/c of generalized weakness for the last 4-5 days. Also endorses chills (with no known fever), decreased appetite, and nausea. Per wife, pt has been having cold sweats and talking in his sleep. She denies any altered mental status while he is awake. Pt denies any HA, visual change, cough, SOB, CP, abd pain, vomiting, change in bowel habits, dysuria, numbness/weakness to the extremities, or trouble ambulating. No known aggravating or alleviating factors. No prior treatment.   Past Medical History  Diagnosis Date  . Coronary artery disease     Past Surgical History  Procedure Date  . Laparoscopic appendectomy 04/17/2012    Procedure: APPENDECTOMY LAPAROSCOPIC;  Surgeon: Dalia Heading, MD;  Location: [facility];  Service: General;  Laterality: N/A;  . Coronary stent placement     No family history on file.  History  Substance Use Topics  . Smoking status: Former Games developer  . Smokeless tobacco: Not on file  . Alcohol Use: No      Review of Systems 10 systems reviewed and are negative for acute change except as noted in the HPI.  Allergies  Paroxetine  Home Medications   Current Outpatient Rx  Name Route Sig Dispense Refill  . ASPIRIN EC 81 MG PO TBEC Oral Take 81 mg by mouth daily.    Marland Kitchen CLOPIDOGREL BISULFATE 75 MG PO TABS Oral Take 75 mg by mouth daily.    . OXYCODONE HCL 15 MG PO TABS Oral Take 15 mg by mouth every 4 (four) hours as needed. pain      BP 122/66  Pulse 87  Temp(Src) 98.8 F (37.1 C) (Rectal)  Resp 16  SpO2 96%  Physical Exam  Nursing note  reviewed. Constitutional: He is oriented to person, place, and time. He appears well-developed and well-nourished. No distress.       VS reviewed, initially with tachycardia at rate or 101, improved to 87 on arrival to room  HENT:  Head: Normocephalic and atraumatic.  Right Ear: External ear normal.  Left Ear: External ear normal.  Nose: Nose normal.  Mouth/Throat: Oropharynx is clear and moist. No oropharyngeal exudate.  Eyes: Conjunctivae and EOM are normal. Pupils are equal, round, and reactive to light. No scleral icterus.       No nystagmus  Neck: Normal range of motion. Neck supple.  Cardiovascular: Normal rate, regular rhythm, normal heart sounds and intact distal pulses.   Pulmonary/Chest: Effort normal and breath sounds normal. No respiratory distress. He has no wheezes. He has no rales. He exhibits no tenderness.  Abdominal: Soft. He exhibits no distension. There is no tenderness. There is no rebound and no guarding.       BS hyperactive. Well-healing surgical scars without signs of infection to the abdomen.  Musculoskeletal: Normal range of motion. He exhibits no edema and no tenderness.       Strength 5/5 in all extremities as tested in major muscle groups  Neurological: He is alert and oriented to person, place, and time. Cranial nerve deficit: CN 3-12 tested and are intact.  Abnormal coordination:  F-N intact bilaterally.       Sensation intact to light touch in all extremities. Speech clear. GCS 15. Pt carries on an appropriate conversation during interview.  Skin: Skin is warm and dry.  Psychiatric: He has a normal mood and affect. His behavior is normal. Thought content normal.    ED Course  Procedures (including critical care time)  Labs Reviewed  CBC - Abnormal; Notable for the following:    WBC 16.0 (*)    RBC 3.52 (*)    Hemoglobin 10.6 (*)    HCT 31.8 (*)    All other components within normal limits  DIFFERENTIAL - Abnormal; Notable for the following:     Neutrophils Relative 82 (*)    Lymphocytes Relative 9 (*)    Neutro Abs 13.2 (*)    Monocytes Absolute 1.4 (*)    All other components within normal limits  COMPREHENSIVE METABOLIC PANEL - Abnormal; Notable for the following:    Sodium 133 (*)    Chloride 95 (*)    Glucose, Bld 133 (*)    Albumin 3.2 (*)    Alkaline Phosphatase 122 (*)    GFR calc non Af Amer 54 (*)    GFR calc Af Amer 62 (*)    All other components within normal limits  PROTIME-INR  URINALYSIS, ROUTINE W REFLEX MICROSCOPIC  AMMONIA  LACTIC ACID, PLASMA   Dg Chest 2 View  05/03/2012  *RADIOLOGY REPORT*  Clinical Data: Recent appendectomy  CHEST - 2 VIEW  Comparison: None.  Findings: Normal cardiac silhouette.  No effusion, infiltrate, or pneumothorax.  Nipple shadows are noted. Vertebral plasty in the lower thoracic spine noted.  Anterior cervical fusion.  IMPRESSION: No acute cardiopulmonary process.  Original Report Authenticated By: Genevive Bi, M.D.      MDM  Generalized weakness in 71 y/o pt with recent surgery. No documented fever, AF, with VSS in ED. Labs sig for leukocytosis, stable anemia. Slight hypokalemia tx with saline bolus. New elevation in Alk Phos, non-specific. CXr without acute findings. Ammonia, lactic acid, u/a added to workup. PA Drue Novel has been given report on this patient at 3:45 pm and will continue to monitor and set disposition accordingly.        Shaaron Adler, PA-C 05/03/12 1551

## 2012-05-03 NOTE — ED Notes (Signed)
Wife states pt not eating getting weaker talking out of his head has been burning up and sweating

## 2012-05-03 NOTE — ED Notes (Signed)
Provided pt with gingerale.  Pt sitting in stretcher.  Wife at bedside.  Wife brought food for pt to eat.  Pt feels ok to eat at this time after Zofran.

## 2012-05-03 NOTE — ED Provider Notes (Signed)
4:36 PM Pt is a 71 yo w hx of CAD, lap appy 3 wks ago who presented to ED for weakness & decreased appetite. Pt care resumed form Artis Flock, [place] & Dr. Radford Pax.  Labs reviewed finding pt to have an unknown source of infection with a WBC count of 16 & lactic acid of 2.6. CXR, ammonia, urine all WNL. Cultures pending. Pt has been hemodynamically stable and in NAD distress through out hospital visit. Mild hyponatremia and hypocalcemia found that will likely be replaced with fluid bolus. Plan per previous provider is to have patient move to the observation unit for 6 hours and re-draw labs including lactic acid, WBC, & BMP. Pt has been reevaluated prior to CDU transfer and is with VSS, NAD, LCAB, heart RRR, no edema distal pulses intact, abdomen NTTP, CN III-VII intact, skin without erythema and scars healing appropriately. He states that he is currently asymptomatic, but fears nausea will occur if he attempts eating. Zofran has been ordered.   5:51 PM  CDU is still full at this time. Another 500 bolus has been ordered.Pt will be given another L bolus with labs re drawn at 10:30 PM to decide disposition.   8:00 PM Pt resting comfortably with no complaints  11:00 PM CBC w diff, lactic acid, and BMP ordered as planned.   11:58 PM  Labs repeated and lactic acid & WBC have trended down. Per previous provider the patient will be dc home with instructions to follow up with his PCP. Patient is hemodynamically stable and in NAD prior to discharge. Diagnosis was discussed with patient who verbalizes understanding and is agreeable to discharge. Pt case discussed with Dr. Jeraldine Loots who agrees with my plan.    Jaci Carrel, PA-C 05/04/12 0000

## 2012-05-04 NOTE — ED Provider Notes (Signed)
Medical screening examination/treatment/procedure(s) were performed by non-physician practitioner and as supervising physician I was immediately available for consultation/collaboration.    Nelia Shi, MD 05/04/12 [phone]

## 2012-05-04 NOTE — Discharge Instructions (Signed)
Followup with your doctor in regards to your hospital visit. If you do not have a doctor use the resource list below to help he find one. You may return to the emergency department if symptoms worsen, become progressive, or become more concerning.  RESOURCE GUIDE  Dental Problems  Patients with Medicaid: [facility] [phone] [address]                                           [phone] [address] Phone:  [phone]                                                  Phone:  [phone]  If unable to pay or uninsured, contact:  [facility] or [facility]. to become qualified for the adult dental clinic.  Chronic Pain Problems Contact [facility]  [phone] Patients need to be referred by their primary care doctor.  Insufficient Money for Medicine Contact United Way:  call "211" or [facility] 678-632-7878.  No Primary Care Doctor Call [facility]  7853509412 Other agencies that provide inexpensive medical care    [facility]  [phone]    [facility]  [phone]    [facility]  [phone]    [facility]  [phone]    [facility]  [phone]    [facility]  5304241866  Psychological Services [facility]  [phone] [facility]  [phone] [facility]   6800089531 (emergency services [phone])  Substance Abuse Resources [facility] Services  216-635-9924 [facility] [phone] [facility] [place] [phone] Floydene Flock [phone] Residential & Outpatient Substance Abuse Program  ([phone]  Abuse/Neglect [facility] Child Abuse Hotline 986-795-3754 [facility] Child Abuse Hotline 445-098-1316 (After Hours)  Emergency Shelter [facility] [phone]  Maternity Homes Room at the [place] [facility] [place] 2815724777 [facility] Alert Services  [phone]  MRSA Hotline #:   4505105632    [facility] Resources  [facility]     [employer]                          [facility] Dept. [address] [place]                       [address]      [address]  [place]                                                Cristobal Goldmann Phone:  [phone]  Phone:  [phone]                 Phone:  [phone]  [facility] Phone:  [phone]  [facility] Child Abuse Hotline 303-738-8239 717-179-9951 (After Hours)

## 2012-05-09 LAB — CULTURE, BLOOD (ROUTINE X 2)
Culture  Setup Time: 201305072256
Culture  Setup Time: 201305072257
Culture: NO GROWTH

## 2012-05-17 NOTE — ED Provider Notes (Signed)
Medical screening examination/treatment/procedure(s) were performed by non-physician practitioner and as supervising physician I was immediately available for consultation/collaboration.    Tenley Winward L Kainoah Bartosiewicz, MD 05/17/12 1642 

## 2012-06-16 DIAGNOSIS — E782 Mixed hyperlipidemia: Secondary | ICD-10-CM | POA: Diagnosis not present

## 2012-06-16 DIAGNOSIS — I2510 Atherosclerotic heart disease of native coronary artery without angina pectoris: Secondary | ICD-10-CM | POA: Diagnosis not present

## 2012-06-16 DIAGNOSIS — I10 Essential (primary) hypertension: Secondary | ICD-10-CM | POA: Diagnosis not present

## 2012-08-05 DIAGNOSIS — E785 Hyperlipidemia, unspecified: Secondary | ICD-10-CM | POA: Diagnosis not present

## 2012-08-05 DIAGNOSIS — I2510 Atherosclerotic heart disease of native coronary artery without angina pectoris: Secondary | ICD-10-CM | POA: Diagnosis not present

## 2012-08-05 DIAGNOSIS — Z6827 Body mass index (BMI) 27.0-27.9, adult: Secondary | ICD-10-CM | POA: Diagnosis not present

## 2012-08-05 DIAGNOSIS — I10 Essential (primary) hypertension: Secondary | ICD-10-CM | POA: Diagnosis not present

## 2012-08-05 DIAGNOSIS — D510 Vitamin B12 deficiency anemia due to intrinsic factor deficiency: Secondary | ICD-10-CM | POA: Diagnosis not present

## 2012-08-31 DIAGNOSIS — H524 Presbyopia: Secondary | ICD-10-CM | POA: Diagnosis not present

## 2012-08-31 DIAGNOSIS — H5200 Hypermetropia, unspecified eye: Secondary | ICD-10-CM | POA: Diagnosis not present

## 2012-08-31 DIAGNOSIS — H353190 Nonexudative age-related macular degeneration, unspecified eye, stage unspecified: Secondary | ICD-10-CM | POA: Diagnosis not present

## 2012-08-31 DIAGNOSIS — H52229 Regular astigmatism, unspecified eye: Secondary | ICD-10-CM | POA: Diagnosis not present

## 2012-10-26 DIAGNOSIS — H353190 Nonexudative age-related macular degeneration, unspecified eye, stage unspecified: Secondary | ICD-10-CM | POA: Diagnosis not present

## 2013-02-07 DIAGNOSIS — I10 Essential (primary) hypertension: Secondary | ICD-10-CM | POA: Diagnosis not present

## 2013-02-07 DIAGNOSIS — D518 Other vitamin B12 deficiency anemias: Secondary | ICD-10-CM | POA: Diagnosis not present

## 2013-02-07 DIAGNOSIS — Z6825 Body mass index (BMI) 25.0-25.9, adult: Secondary | ICD-10-CM | POA: Diagnosis not present

## 2013-02-07 DIAGNOSIS — G8929 Other chronic pain: Secondary | ICD-10-CM | POA: Diagnosis not present

## 2013-02-07 DIAGNOSIS — K219 Gastro-esophageal reflux disease without esophagitis: Secondary | ICD-10-CM | POA: Diagnosis not present

## 2013-04-24 DIAGNOSIS — E785 Hyperlipidemia, unspecified: Secondary | ICD-10-CM | POA: Diagnosis not present

## 2013-04-24 DIAGNOSIS — G8929 Other chronic pain: Secondary | ICD-10-CM | POA: Diagnosis not present

## 2013-04-24 DIAGNOSIS — I10 Essential (primary) hypertension: Secondary | ICD-10-CM | POA: Diagnosis not present

## 2013-04-24 DIAGNOSIS — Z6826 Body mass index (BMI) 26.0-26.9, adult: Secondary | ICD-10-CM | POA: Diagnosis not present

## 2013-04-24 DIAGNOSIS — E539 Vitamin B deficiency, unspecified: Secondary | ICD-10-CM | POA: Diagnosis not present

## 2013-07-03 DIAGNOSIS — G8929 Other chronic pain: Secondary | ICD-10-CM | POA: Diagnosis not present

## 2013-07-03 DIAGNOSIS — Z6827 Body mass index (BMI) 27.0-27.9, adult: Secondary | ICD-10-CM | POA: Diagnosis not present

## 2013-07-03 DIAGNOSIS — D510 Vitamin B12 deficiency anemia due to intrinsic factor deficiency: Secondary | ICD-10-CM | POA: Diagnosis not present

## 2013-08-10 DIAGNOSIS — D518 Other vitamin B12 deficiency anemias: Secondary | ICD-10-CM | POA: Diagnosis not present

## 2013-08-10 DIAGNOSIS — G8929 Other chronic pain: Secondary | ICD-10-CM | POA: Diagnosis not present

## 2013-08-10 DIAGNOSIS — Z6826 Body mass index (BMI) 26.0-26.9, adult: Secondary | ICD-10-CM | POA: Diagnosis not present

## 2013-09-14 DIAGNOSIS — D510 Vitamin B12 deficiency anemia due to intrinsic factor deficiency: Secondary | ICD-10-CM | POA: Diagnosis not present

## 2013-09-14 DIAGNOSIS — Z6826 Body mass index (BMI) 26.0-26.9, adult: Secondary | ICD-10-CM | POA: Diagnosis not present

## 2013-09-14 DIAGNOSIS — Z23 Encounter for immunization: Secondary | ICD-10-CM | POA: Diagnosis not present

## 2013-09-14 DIAGNOSIS — G8929 Other chronic pain: Secondary | ICD-10-CM | POA: Diagnosis not present

## 2013-10-18 DIAGNOSIS — D510 Vitamin B12 deficiency anemia due to intrinsic factor deficiency: Secondary | ICD-10-CM | POA: Diagnosis not present

## 2013-10-18 DIAGNOSIS — I2510 Atherosclerotic heart disease of native coronary artery without angina pectoris: Secondary | ICD-10-CM | POA: Diagnosis not present

## 2013-10-18 DIAGNOSIS — Z6827 Body mass index (BMI) 27.0-27.9, adult: Secondary | ICD-10-CM | POA: Diagnosis not present

## 2013-10-18 DIAGNOSIS — I10 Essential (primary) hypertension: Secondary | ICD-10-CM | POA: Diagnosis not present

## 2013-10-18 DIAGNOSIS — G8929 Other chronic pain: Secondary | ICD-10-CM | POA: Diagnosis not present

## 2013-12-23 DIAGNOSIS — G8929 Other chronic pain: Secondary | ICD-10-CM | POA: Diagnosis not present

## 2013-12-23 DIAGNOSIS — D510 Vitamin B12 deficiency anemia due to intrinsic factor deficiency: Secondary | ICD-10-CM | POA: Diagnosis not present

## 2013-12-23 DIAGNOSIS — Z6827 Body mass index (BMI) 27.0-27.9, adult: Secondary | ICD-10-CM | POA: Diagnosis not present

## 2013-12-23 DIAGNOSIS — I10 Essential (primary) hypertension: Secondary | ICD-10-CM | POA: Diagnosis not present

## 2014-02-23 DIAGNOSIS — G8929 Other chronic pain: Secondary | ICD-10-CM | POA: Diagnosis not present

## 2014-02-23 DIAGNOSIS — Z6827 Body mass index (BMI) 27.0-27.9, adult: Secondary | ICD-10-CM | POA: Diagnosis not present

## 2014-02-23 DIAGNOSIS — D510 Vitamin B12 deficiency anemia due to intrinsic factor deficiency: Secondary | ICD-10-CM | POA: Diagnosis not present

## 2014-04-23 DIAGNOSIS — Z6826 Body mass index (BMI) 26.0-26.9, adult: Secondary | ICD-10-CM | POA: Diagnosis not present

## 2014-04-23 DIAGNOSIS — K219 Gastro-esophageal reflux disease without esophagitis: Secondary | ICD-10-CM | POA: Diagnosis not present

## 2014-04-23 DIAGNOSIS — D510 Vitamin B12 deficiency anemia due to intrinsic factor deficiency: Secondary | ICD-10-CM | POA: Diagnosis not present

## 2014-04-23 DIAGNOSIS — G8929 Other chronic pain: Secondary | ICD-10-CM | POA: Diagnosis not present

## 2014-04-23 DIAGNOSIS — I10 Essential (primary) hypertension: Secondary | ICD-10-CM | POA: Diagnosis not present

## 2014-05-02 DIAGNOSIS — N529 Male erectile dysfunction, unspecified: Secondary | ICD-10-CM | POA: Diagnosis not present

## 2014-05-02 DIAGNOSIS — K219 Gastro-esophageal reflux disease without esophagitis: Secondary | ICD-10-CM | POA: Diagnosis not present

## 2014-05-02 DIAGNOSIS — Z6825 Body mass index (BMI) 25.0-25.9, adult: Secondary | ICD-10-CM | POA: Diagnosis not present

## 2014-06-24 DIAGNOSIS — J9589 Other postprocedural complications and disorders of respiratory system, not elsewhere classified: Secondary | ICD-10-CM | POA: Diagnosis not present

## 2014-06-24 DIAGNOSIS — J9383 Other pneumothorax: Secondary | ICD-10-CM | POA: Diagnosis not present

## 2014-06-24 DIAGNOSIS — S301XXA Contusion of abdominal wall, initial encounter: Secondary | ICD-10-CM | POA: Diagnosis present

## 2014-06-24 DIAGNOSIS — Z9861 Coronary angioplasty status: Secondary | ICD-10-CM | POA: Diagnosis not present

## 2014-06-24 DIAGNOSIS — M79609 Pain in unspecified limb: Secondary | ICD-10-CM | POA: Diagnosis not present

## 2014-06-24 DIAGNOSIS — S066X0A Traumatic subarachnoid hemorrhage without loss of consciousness, initial encounter: Secondary | ICD-10-CM | POA: Diagnosis not present

## 2014-06-24 DIAGNOSIS — R7309 Other abnormal glucose: Secondary | ICD-10-CM | POA: Diagnosis present

## 2014-06-24 DIAGNOSIS — G8929 Other chronic pain: Secondary | ICD-10-CM | POA: Diagnosis present

## 2014-06-24 DIAGNOSIS — Z79899 Other long term (current) drug therapy: Secondary | ICD-10-CM | POA: Diagnosis not present

## 2014-06-24 DIAGNOSIS — S066XAA Traumatic subarachnoid hemorrhage with loss of consciousness status unknown, initial encounter: Secondary | ICD-10-CM | POA: Diagnosis not present

## 2014-06-24 DIAGNOSIS — S270XXA Traumatic pneumothorax, initial encounter: Secondary | ICD-10-CM | POA: Diagnosis not present

## 2014-06-24 DIAGNOSIS — S42199A Fracture of other part of scapula, unspecified shoulder, initial encounter for closed fracture: Secondary | ICD-10-CM | POA: Diagnosis not present

## 2014-06-24 DIAGNOSIS — R0902 Hypoxemia: Secondary | ICD-10-CM | POA: Insufficient documentation

## 2014-06-24 DIAGNOSIS — Z7902 Long term (current) use of antithrombotics/antiplatelets: Secondary | ICD-10-CM | POA: Diagnosis not present

## 2014-06-24 DIAGNOSIS — D72829 Elevated white blood cell count, unspecified: Secondary | ICD-10-CM | POA: Diagnosis not present

## 2014-06-24 DIAGNOSIS — I6200 Nontraumatic subdural hemorrhage, unspecified: Secondary | ICD-10-CM | POA: Diagnosis not present

## 2014-06-24 DIAGNOSIS — I609 Nontraumatic subarachnoid hemorrhage, unspecified: Secondary | ICD-10-CM | POA: Diagnosis not present

## 2014-06-24 DIAGNOSIS — I2510 Atherosclerotic heart disease of native coronary artery without angina pectoris: Secondary | ICD-10-CM | POA: Diagnosis present

## 2014-06-24 DIAGNOSIS — S42009A Fracture of unspecified part of unspecified clavicle, initial encounter for closed fracture: Secondary | ICD-10-CM | POA: Diagnosis not present

## 2014-06-24 DIAGNOSIS — S065X0A Traumatic subdural hemorrhage without loss of consciousness, initial encounter: Secondary | ICD-10-CM | POA: Diagnosis present

## 2014-06-24 DIAGNOSIS — S42109A Fracture of unspecified part of scapula, unspecified shoulder, initial encounter for closed fracture: Secondary | ICD-10-CM | POA: Diagnosis not present

## 2014-06-24 DIAGNOSIS — S27329A Contusion of lung, unspecified, initial encounter: Secondary | ICD-10-CM | POA: Diagnosis not present

## 2014-06-24 DIAGNOSIS — M959 Acquired deformity of musculoskeletal system, unspecified: Secondary | ICD-10-CM | POA: Diagnosis not present

## 2014-06-24 DIAGNOSIS — T797XXA Traumatic subcutaneous emphysema, initial encounter: Secondary | ICD-10-CM | POA: Diagnosis not present

## 2014-06-24 DIAGNOSIS — D649 Anemia, unspecified: Secondary | ICD-10-CM | POA: Diagnosis not present

## 2014-06-24 DIAGNOSIS — S2249XA Multiple fractures of ribs, unspecified side, initial encounter for closed fracture: Secondary | ICD-10-CM | POA: Diagnosis not present

## 2014-06-24 DIAGNOSIS — R1084 Generalized abdominal pain: Secondary | ICD-10-CM | POA: Diagnosis not present

## 2014-06-24 DIAGNOSIS — M542 Cervicalgia: Secondary | ICD-10-CM | POA: Diagnosis not present

## 2014-06-24 DIAGNOSIS — R071 Chest pain on breathing: Secondary | ICD-10-CM | POA: Diagnosis not present

## 2014-06-24 DIAGNOSIS — G8911 Acute pain due to trauma: Secondary | ICD-10-CM | POA: Diagnosis present

## 2014-06-24 DIAGNOSIS — M549 Dorsalgia, unspecified: Secondary | ICD-10-CM | POA: Diagnosis present

## 2014-06-24 DIAGNOSIS — S32009A Unspecified fracture of unspecified lumbar vertebra, initial encounter for closed fracture: Secondary | ICD-10-CM | POA: Diagnosis not present

## 2014-06-24 DIAGNOSIS — S066X9A Traumatic subarachnoid hemorrhage with loss of consciousness of unspecified duration, initial encounter: Secondary | ICD-10-CM | POA: Diagnosis not present

## 2014-06-24 DIAGNOSIS — IMO0002 Reserved for concepts with insufficient information to code with codable children: Secondary | ICD-10-CM | POA: Diagnosis not present

## 2014-06-24 DIAGNOSIS — M25559 Pain in unspecified hip: Secondary | ICD-10-CM | POA: Diagnosis not present

## 2014-06-24 DIAGNOSIS — S300XXA Contusion of lower back and pelvis, initial encounter: Secondary | ICD-10-CM | POA: Diagnosis present

## 2014-06-24 DIAGNOSIS — D62 Acute posthemorrhagic anemia: Secondary | ICD-10-CM | POA: Diagnosis not present

## 2014-06-25 DIAGNOSIS — I629 Nontraumatic intracranial hemorrhage, unspecified: Secondary | ICD-10-CM | POA: Insufficient documentation

## 2014-06-25 DIAGNOSIS — S27329A Contusion of lung, unspecified, initial encounter: Secondary | ICD-10-CM | POA: Insufficient documentation

## 2014-06-25 DIAGNOSIS — I2510 Atherosclerotic heart disease of native coronary artery without angina pectoris: Secondary | ICD-10-CM | POA: Insufficient documentation

## 2014-06-25 DIAGNOSIS — I615 Nontraumatic intracerebral hemorrhage, intraventricular: Secondary | ICD-10-CM | POA: Insufficient documentation

## 2014-06-25 DIAGNOSIS — I609 Nontraumatic subarachnoid hemorrhage, unspecified: Secondary | ICD-10-CM | POA: Insufficient documentation

## 2014-06-25 DIAGNOSIS — T1490XA Injury, unspecified, initial encounter: Secondary | ICD-10-CM | POA: Insufficient documentation

## 2014-06-25 DIAGNOSIS — S069XAA Unspecified intracranial injury with loss of consciousness status unknown, initial encounter: Secondary | ICD-10-CM | POA: Insufficient documentation

## 2014-06-25 DIAGNOSIS — T797XXA Traumatic subcutaneous emphysema, initial encounter: Secondary | ICD-10-CM | POA: Insufficient documentation

## 2014-07-11 DIAGNOSIS — R634 Abnormal weight loss: Secondary | ICD-10-CM | POA: Diagnosis not present

## 2014-07-11 DIAGNOSIS — IMO0001 Reserved for inherently not codable concepts without codable children: Secondary | ICD-10-CM | POA: Diagnosis not present

## 2014-07-11 DIAGNOSIS — R630 Anorexia: Secondary | ICD-10-CM | POA: Diagnosis not present

## 2014-07-11 DIAGNOSIS — S27329A Contusion of lung, unspecified, initial encounter: Secondary | ICD-10-CM | POA: Diagnosis not present

## 2014-07-16 DIAGNOSIS — D510 Vitamin B12 deficiency anemia due to intrinsic factor deficiency: Secondary | ICD-10-CM | POA: Diagnosis not present

## 2014-07-16 DIAGNOSIS — G8929 Other chronic pain: Secondary | ICD-10-CM | POA: Diagnosis not present

## 2014-07-16 DIAGNOSIS — IMO0002 Reserved for concepts with insufficient information to code with codable children: Secondary | ICD-10-CM | POA: Diagnosis not present

## 2014-07-16 DIAGNOSIS — S42009A Fracture of unspecified part of unspecified clavicle, initial encounter for closed fracture: Secondary | ICD-10-CM | POA: Diagnosis not present

## 2014-07-16 DIAGNOSIS — S2239XA Fracture of one rib, unspecified side, initial encounter for closed fracture: Secondary | ICD-10-CM | POA: Diagnosis not present

## 2014-07-18 DIAGNOSIS — Z09 Encounter for follow-up examination after completed treatment for conditions other than malignant neoplasm: Secondary | ICD-10-CM | POA: Diagnosis not present

## 2014-07-18 DIAGNOSIS — IMO0001 Reserved for inherently not codable concepts without codable children: Secondary | ICD-10-CM | POA: Diagnosis not present

## 2014-07-18 DIAGNOSIS — I6200 Nontraumatic subdural hemorrhage, unspecified: Secondary | ICD-10-CM | POA: Diagnosis not present

## 2014-07-18 DIAGNOSIS — Z87820 Personal history of traumatic brain injury: Secondary | ICD-10-CM | POA: Diagnosis not present

## 2014-08-10 DIAGNOSIS — Z4789 Encounter for other orthopedic aftercare: Secondary | ICD-10-CM | POA: Diagnosis not present

## 2014-08-13 DIAGNOSIS — D510 Vitamin B12 deficiency anemia due to intrinsic factor deficiency: Secondary | ICD-10-CM | POA: Diagnosis not present

## 2014-10-01 ENCOUNTER — Telehealth: Payer: Self-pay | Admitting: Cardiovascular Disease

## 2014-10-01 NOTE — Telephone Encounter (Signed)
Closed enounter °

## 2014-11-09 ENCOUNTER — Ambulatory Visit: Payer: Medicare Other | Admitting: Cardiovascular Disease

## 2014-11-26 DIAGNOSIS — Z6824 Body mass index (BMI) 24.0-24.9, adult: Secondary | ICD-10-CM | POA: Diagnosis not present

## 2014-11-26 DIAGNOSIS — G894 Chronic pain syndrome: Secondary | ICD-10-CM | POA: Diagnosis not present

## 2014-11-26 DIAGNOSIS — E538 Deficiency of other specified B group vitamins: Secondary | ICD-10-CM | POA: Diagnosis not present

## 2014-11-26 DIAGNOSIS — Z23 Encounter for immunization: Secondary | ICD-10-CM | POA: Diagnosis not present

## 2014-11-26 DIAGNOSIS — F959 Tic disorder, unspecified: Secondary | ICD-10-CM | POA: Diagnosis not present

## 2014-12-25 DIAGNOSIS — D510 Vitamin B12 deficiency anemia due to intrinsic factor deficiency: Secondary | ICD-10-CM | POA: Diagnosis not present

## 2014-12-25 DIAGNOSIS — E782 Mixed hyperlipidemia: Secondary | ICD-10-CM | POA: Diagnosis not present

## 2014-12-25 DIAGNOSIS — I10 Essential (primary) hypertension: Secondary | ICD-10-CM | POA: Diagnosis not present

## 2014-12-25 DIAGNOSIS — Z125 Encounter for screening for malignant neoplasm of prostate: Secondary | ICD-10-CM | POA: Diagnosis not present

## 2014-12-25 DIAGNOSIS — J040 Acute laryngitis: Secondary | ICD-10-CM | POA: Diagnosis not present

## 2015-01-02 ENCOUNTER — Ambulatory Visit (INDEPENDENT_AMBULATORY_CARE_PROVIDER_SITE_OTHER): Payer: Medicare Other | Admitting: Cardiovascular Disease

## 2015-01-02 ENCOUNTER — Encounter: Payer: Self-pay | Admitting: Cardiovascular Disease

## 2015-01-02 VITALS — BP 128/80 | HR 75 | Ht 74.0 in | Wt 190.0 lb

## 2015-01-02 DIAGNOSIS — I2583 Coronary atherosclerosis due to lipid rich plaque: Principal | ICD-10-CM

## 2015-01-02 DIAGNOSIS — I2510 Atherosclerotic heart disease of native coronary artery without angina pectoris: Secondary | ICD-10-CM | POA: Insufficient documentation

## 2015-01-02 NOTE — Patient Instructions (Signed)
Your physician wants you to follow-up in 1 year with Dr. Berry. You will receive a reminder letter in the mail 2 months in advance. If you do not receive a letter, please call our office to schedule the follow-up appointment.  

## 2015-01-02 NOTE — Assessment & Plan Note (Signed)
History of CAD status post LAD PCI and stenting by Dr. Ellouise Newer 09/03/04 with a Cypher drug-eluting stent. His LV function was normal at that time. His last stress test performed 10/04/09 was nonischemic. He denies chest pain or shortness of breath.
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
## 2015-03-26 DIAGNOSIS — E782 Mixed hyperlipidemia: Secondary | ICD-10-CM | POA: Diagnosis not present

## 2015-03-26 DIAGNOSIS — Z6826 Body mass index (BMI) 26.0-26.9, adult: Secondary | ICD-10-CM | POA: Diagnosis not present

## 2015-03-26 DIAGNOSIS — E538 Deficiency of other specified B group vitamins: Secondary | ICD-10-CM | POA: Diagnosis not present

## 2015-03-26 DIAGNOSIS — I10 Essential (primary) hypertension: Secondary | ICD-10-CM | POA: Diagnosis not present

## 2015-03-26 DIAGNOSIS — H9193 Unspecified hearing loss, bilateral: Secondary | ICD-10-CM | POA: Diagnosis not present

## 2015-03-26 DIAGNOSIS — E663 Overweight: Secondary | ICD-10-CM | POA: Diagnosis not present

## 2015-05-10 DIAGNOSIS — H9193 Unspecified hearing loss, bilateral: Secondary | ICD-10-CM | POA: Diagnosis not present

## 2015-05-10 DIAGNOSIS — H9313 Tinnitus, bilateral: Secondary | ICD-10-CM | POA: Diagnosis not present

## 2015-05-10 DIAGNOSIS — H903 Sensorineural hearing loss, bilateral: Secondary | ICD-10-CM | POA: Diagnosis not present

## 2015-05-13 DIAGNOSIS — H3530 Unspecified macular degeneration: Secondary | ICD-10-CM | POA: Diagnosis not present

## 2015-05-13 DIAGNOSIS — H5203 Hypermetropia, bilateral: Secondary | ICD-10-CM | POA: Diagnosis not present

## 2015-06-11 DIAGNOSIS — G894 Chronic pain syndrome: Secondary | ICD-10-CM | POA: Diagnosis not present

## 2015-06-11 DIAGNOSIS — E538 Deficiency of other specified B group vitamins: Secondary | ICD-10-CM | POA: Diagnosis not present

## 2015-06-11 DIAGNOSIS — I10 Essential (primary) hypertension: Secondary | ICD-10-CM | POA: Diagnosis not present

## 2015-06-11 DIAGNOSIS — D510 Vitamin B12 deficiency anemia due to intrinsic factor deficiency: Secondary | ICD-10-CM | POA: Diagnosis not present

## 2015-06-11 DIAGNOSIS — E663 Overweight: Secondary | ICD-10-CM | POA: Diagnosis not present

## 2015-06-11 DIAGNOSIS — Z6826 Body mass index (BMI) 26.0-26.9, adult: Secondary | ICD-10-CM | POA: Diagnosis not present

## 2015-07-16 DIAGNOSIS — Z8679 Personal history of other diseases of the circulatory system: Secondary | ICD-10-CM | POA: Insufficient documentation

## 2015-07-16 DIAGNOSIS — R4589 Other symptoms and signs involving emotional state: Secondary | ICD-10-CM | POA: Insufficient documentation

## 2015-07-16 DIAGNOSIS — Z862 Personal history of diseases of the blood and blood-forming organs and certain disorders involving the immune mechanism: Secondary | ICD-10-CM | POA: Insufficient documentation

## 2015-07-16 DIAGNOSIS — Z8719 Personal history of other diseases of the digestive system: Secondary | ICD-10-CM | POA: Insufficient documentation

## 2015-07-16 DIAGNOSIS — D696 Thrombocytopenia, unspecified: Secondary | ICD-10-CM | POA: Diagnosis present

## 2015-07-16 DIAGNOSIS — E538 Deficiency of other specified B group vitamins: Secondary | ICD-10-CM | POA: Diagnosis not present

## 2015-07-16 DIAGNOSIS — F418 Other specified anxiety disorders: Secondary | ICD-10-CM | POA: Diagnosis not present

## 2015-07-19 DIAGNOSIS — D631 Anemia in chronic kidney disease: Secondary | ICD-10-CM | POA: Diagnosis not present

## 2015-07-19 DIAGNOSIS — N183 Chronic kidney disease, stage 3 (moderate): Secondary | ICD-10-CM | POA: Diagnosis not present

## 2015-07-19 DIAGNOSIS — E785 Hyperlipidemia, unspecified: Secondary | ICD-10-CM | POA: Diagnosis not present

## 2015-07-19 DIAGNOSIS — I10 Essential (primary) hypertension: Secondary | ICD-10-CM | POA: Diagnosis not present

## 2015-07-25 DIAGNOSIS — K6389 Other specified diseases of intestine: Secondary | ICD-10-CM | POA: Diagnosis not present

## 2015-07-25 DIAGNOSIS — Z862 Personal history of diseases of the blood and blood-forming organs and certain disorders involving the immune mechanism: Secondary | ICD-10-CM | POA: Diagnosis not present

## 2015-07-25 DIAGNOSIS — D698 Other specified hemorrhagic conditions: Secondary | ICD-10-CM | POA: Diagnosis not present

## 2015-07-25 DIAGNOSIS — D696 Thrombocytopenia, unspecified: Secondary | ICD-10-CM | POA: Diagnosis not present

## 2015-07-30 ENCOUNTER — Telehealth: Payer: Self-pay | Admitting: Cardiovascular Disease

## 2015-07-30 NOTE — Telephone Encounter (Signed)
The patient can come off his Plavix for his dental work

## 2015-07-30 NOTE — Telephone Encounter (Signed)
Spoke with pt, he reports he is scared to come off the plavix because dr Tressie Stalker has been doing a lot of testing and his platelet count is extremely low. Pt reports he is seeing dr Tressie Stalker tomorrow, I requested the pt have all the records forwarded to dr berry for his review. The pt wants dr berry to look at those records and then make the decision about the plavix. Will await records.

## 2015-07-30 NOTE — Telephone Encounter (Signed)
Mrs.Kaley is calling because Travis Woodward has to have some dental work done and he is on blood thinners and  Dr.Neijstrom wanted to know if he can come off of the blood thinners while he is having the dental works . He has a stent. Please call .Marland Kitchen   Thanks

## 2015-07-30 NOTE — Telephone Encounter (Signed)
Will forward for dr berry's review and okay

## 2015-07-31 DIAGNOSIS — F418 Other specified anxiety disorders: Secondary | ICD-10-CM | POA: Diagnosis not present

## 2015-07-31 DIAGNOSIS — E538 Deficiency of other specified B group vitamins: Secondary | ICD-10-CM | POA: Diagnosis not present

## 2015-07-31 DIAGNOSIS — D696 Thrombocytopenia, unspecified: Secondary | ICD-10-CM | POA: Diagnosis not present

## 2015-07-31 DIAGNOSIS — Z862 Personal history of diseases of the blood and blood-forming organs and certain disorders involving the immune mechanism: Secondary | ICD-10-CM | POA: Diagnosis not present

## 2015-08-15 DIAGNOSIS — I10 Essential (primary) hypertension: Secondary | ICD-10-CM | POA: Diagnosis not present

## 2015-08-15 DIAGNOSIS — N2581 Secondary hyperparathyroidism of renal origin: Secondary | ICD-10-CM | POA: Diagnosis not present

## 2015-08-15 DIAGNOSIS — N183 Chronic kidney disease, stage 3 (moderate): Secondary | ICD-10-CM | POA: Diagnosis not present

## 2015-08-15 DIAGNOSIS — E559 Vitamin D deficiency, unspecified: Secondary | ICD-10-CM | POA: Diagnosis not present

## 2015-09-11 DIAGNOSIS — Z23 Encounter for immunization: Secondary | ICD-10-CM | POA: Diagnosis not present

## 2015-09-11 DIAGNOSIS — E538 Deficiency of other specified B group vitamins: Secondary | ICD-10-CM | POA: Diagnosis not present

## 2015-09-12 DIAGNOSIS — E785 Hyperlipidemia, unspecified: Secondary | ICD-10-CM | POA: Diagnosis not present

## 2015-09-12 DIAGNOSIS — N2581 Secondary hyperparathyroidism of renal origin: Secondary | ICD-10-CM | POA: Diagnosis not present

## 2015-09-12 DIAGNOSIS — E875 Hyperkalemia: Secondary | ICD-10-CM | POA: Diagnosis not present

## 2015-09-12 DIAGNOSIS — N183 Chronic kidney disease, stage 3 (moderate): Secondary | ICD-10-CM | POA: Diagnosis not present

## 2015-09-12 DIAGNOSIS — I10 Essential (primary) hypertension: Secondary | ICD-10-CM | POA: Diagnosis not present

## 2015-09-27 DIAGNOSIS — Z862 Personal history of diseases of the blood and blood-forming organs and certain disorders involving the immune mechanism: Secondary | ICD-10-CM | POA: Diagnosis not present

## 2015-09-27 DIAGNOSIS — N183 Chronic kidney disease, stage 3 (moderate): Secondary | ICD-10-CM | POA: Diagnosis not present

## 2015-09-27 DIAGNOSIS — N2581 Secondary hyperparathyroidism of renal origin: Secondary | ICD-10-CM | POA: Diagnosis not present

## 2015-09-27 DIAGNOSIS — E538 Deficiency of other specified B group vitamins: Secondary | ICD-10-CM | POA: Diagnosis not present

## 2015-09-27 DIAGNOSIS — E875 Hyperkalemia: Secondary | ICD-10-CM | POA: Diagnosis not present

## 2015-09-27 DIAGNOSIS — D696 Thrombocytopenia, unspecified: Secondary | ICD-10-CM | POA: Diagnosis not present

## 2015-09-27 DIAGNOSIS — I10 Essential (primary) hypertension: Secondary | ICD-10-CM | POA: Diagnosis not present

## 2015-10-30 DIAGNOSIS — E663 Overweight: Secondary | ICD-10-CM | POA: Diagnosis not present

## 2015-10-30 DIAGNOSIS — Z6825 Body mass index (BMI) 25.0-25.9, adult: Secondary | ICD-10-CM | POA: Diagnosis not present

## 2015-10-30 DIAGNOSIS — E538 Deficiency of other specified B group vitamins: Secondary | ICD-10-CM | POA: Diagnosis not present

## 2015-10-30 DIAGNOSIS — Z1389 Encounter for screening for other disorder: Secondary | ICD-10-CM | POA: Diagnosis not present

## 2015-10-30 DIAGNOSIS — B029 Zoster without complications: Secondary | ICD-10-CM | POA: Diagnosis not present

## 2015-11-20 DIAGNOSIS — D696 Thrombocytopenia, unspecified: Secondary | ICD-10-CM | POA: Diagnosis not present

## 2015-11-20 DIAGNOSIS — E538 Deficiency of other specified B group vitamins: Secondary | ICD-10-CM | POA: Diagnosis not present

## 2015-12-12 DIAGNOSIS — E875 Hyperkalemia: Secondary | ICD-10-CM | POA: Diagnosis not present

## 2015-12-12 DIAGNOSIS — N183 Chronic kidney disease, stage 3 (moderate): Secondary | ICD-10-CM | POA: Diagnosis not present

## 2015-12-12 DIAGNOSIS — I129 Hypertensive chronic kidney disease with stage 1 through stage 4 chronic kidney disease, or unspecified chronic kidney disease: Secondary | ICD-10-CM | POA: Diagnosis not present

## 2015-12-12 DIAGNOSIS — N2581 Secondary hyperparathyroidism of renal origin: Secondary | ICD-10-CM | POA: Diagnosis not present

## 2015-12-19 DIAGNOSIS — Z6825 Body mass index (BMI) 25.0-25.9, adult: Secondary | ICD-10-CM | POA: Diagnosis not present

## 2015-12-19 DIAGNOSIS — Z1389 Encounter for screening for other disorder: Secondary | ICD-10-CM | POA: Diagnosis not present

## 2015-12-19 DIAGNOSIS — E538 Deficiency of other specified B group vitamins: Secondary | ICD-10-CM | POA: Diagnosis not present

## 2015-12-19 DIAGNOSIS — G894 Chronic pain syndrome: Secondary | ICD-10-CM | POA: Diagnosis not present

## 2016-01-03 ENCOUNTER — Ambulatory Visit (INDEPENDENT_AMBULATORY_CARE_PROVIDER_SITE_OTHER): Payer: Medicare Other | Admitting: Cardiovascular Disease

## 2016-01-03 ENCOUNTER — Encounter: Payer: Self-pay | Admitting: Cardiovascular Disease

## 2016-01-03 VITALS — BP 110/62 | HR 97 | Ht 74.0 in | Wt 190.5 lb

## 2016-01-03 DIAGNOSIS — I2510 Atherosclerotic heart disease of native coronary artery without angina pectoris: Secondary | ICD-10-CM

## 2016-01-03 DIAGNOSIS — I2583 Coronary atherosclerosis due to lipid rich plaque: Principal | ICD-10-CM

## 2016-01-03 NOTE — Patient Instructions (Signed)

## 2016-01-03 NOTE — Assessment & Plan Note (Signed)
History of coronary artery disease status post LAD stenting with a Cypher drug eluding stent by Dr. Claiborne Billings 09/03/04. His last Myoview performed 10/04/09 was nonischemic. He denies chest pain or shortness of breath.
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
## 2016-01-15 DIAGNOSIS — E538 Deficiency of other specified B group vitamins: Secondary | ICD-10-CM | POA: Diagnosis not present

## 2016-01-15 DIAGNOSIS — D696 Thrombocytopenia, unspecified: Secondary | ICD-10-CM | POA: Diagnosis not present

## 2016-01-15 DIAGNOSIS — Z8679 Personal history of other diseases of the circulatory system: Secondary | ICD-10-CM | POA: Diagnosis not present

## 2016-03-23 DIAGNOSIS — Z6826 Body mass index (BMI) 26.0-26.9, adult: Secondary | ICD-10-CM | POA: Diagnosis not present

## 2016-03-23 DIAGNOSIS — Z1389 Encounter for screening for other disorder: Secondary | ICD-10-CM | POA: Diagnosis not present

## 2016-03-23 DIAGNOSIS — G894 Chronic pain syndrome: Secondary | ICD-10-CM | POA: Diagnosis not present

## 2016-03-23 DIAGNOSIS — E538 Deficiency of other specified B group vitamins: Secondary | ICD-10-CM | POA: Diagnosis not present

## 2016-06-24 DIAGNOSIS — E663 Overweight: Secondary | ICD-10-CM | POA: Diagnosis not present

## 2016-06-24 DIAGNOSIS — H00012 Hordeolum externum right lower eyelid: Secondary | ICD-10-CM | POA: Diagnosis not present

## 2016-06-24 DIAGNOSIS — E538 Deficiency of other specified B group vitamins: Secondary | ICD-10-CM | POA: Diagnosis not present

## 2016-06-24 DIAGNOSIS — M1991 Primary osteoarthritis, unspecified site: Secondary | ICD-10-CM | POA: Diagnosis not present

## 2016-06-24 DIAGNOSIS — Z6826 Body mass index (BMI) 26.0-26.9, adult: Secondary | ICD-10-CM | POA: Diagnosis not present

## 2016-06-24 DIAGNOSIS — Z1389 Encounter for screening for other disorder: Secondary | ICD-10-CM | POA: Diagnosis not present

## 2016-07-15 DIAGNOSIS — E875 Hyperkalemia: Secondary | ICD-10-CM | POA: Diagnosis not present

## 2016-07-15 DIAGNOSIS — N2581 Secondary hyperparathyroidism of renal origin: Secondary | ICD-10-CM | POA: Diagnosis not present

## 2016-07-15 DIAGNOSIS — N183 Chronic kidney disease, stage 3 (moderate): Secondary | ICD-10-CM | POA: Diagnosis not present

## 2016-07-15 DIAGNOSIS — I10 Essential (primary) hypertension: Secondary | ICD-10-CM | POA: Diagnosis not present

## 2016-09-14 DIAGNOSIS — Z1389 Encounter for screening for other disorder: Secondary | ICD-10-CM | POA: Diagnosis not present

## 2016-09-14 DIAGNOSIS — E663 Overweight: Secondary | ICD-10-CM | POA: Diagnosis not present

## 2016-09-14 DIAGNOSIS — I10 Essential (primary) hypertension: Secondary | ICD-10-CM | POA: Diagnosis not present

## 2016-09-14 DIAGNOSIS — E538 Deficiency of other specified B group vitamins: Secondary | ICD-10-CM | POA: Diagnosis not present

## 2016-09-14 DIAGNOSIS — G894 Chronic pain syndrome: Secondary | ICD-10-CM | POA: Diagnosis not present

## 2016-09-14 DIAGNOSIS — Z6826 Body mass index (BMI) 26.0-26.9, adult: Secondary | ICD-10-CM | POA: Diagnosis not present

## 2016-09-14 DIAGNOSIS — K219 Gastro-esophageal reflux disease without esophagitis: Secondary | ICD-10-CM | POA: Diagnosis not present

## 2017-01-05 DIAGNOSIS — Z1389 Encounter for screening for other disorder: Secondary | ICD-10-CM | POA: Diagnosis not present

## 2017-01-05 DIAGNOSIS — D649 Anemia, unspecified: Secondary | ICD-10-CM | POA: Diagnosis not present

## 2017-01-05 DIAGNOSIS — Z6827 Body mass index (BMI) 27.0-27.9, adult: Secondary | ICD-10-CM | POA: Diagnosis not present

## 2017-01-05 DIAGNOSIS — E748 Other specified disorders of carbohydrate metabolism: Secondary | ICD-10-CM | POA: Diagnosis not present

## 2017-01-05 DIAGNOSIS — D510 Vitamin B12 deficiency anemia due to intrinsic factor deficiency: Secondary | ICD-10-CM | POA: Diagnosis not present

## 2017-01-05 DIAGNOSIS — M1991 Primary osteoarthritis, unspecified site: Secondary | ICD-10-CM | POA: Diagnosis not present

## 2017-01-05 DIAGNOSIS — Z23 Encounter for immunization: Secondary | ICD-10-CM | POA: Diagnosis not present

## 2017-01-05 DIAGNOSIS — M130 Polyarthritis, unspecified: Secondary | ICD-10-CM | POA: Diagnosis not present

## 2017-01-05 DIAGNOSIS — G894 Chronic pain syndrome: Secondary | ICD-10-CM | POA: Diagnosis not present

## 2017-01-06 ENCOUNTER — Ambulatory Visit: Payer: Medicare Other | Admitting: Cardiovascular Disease

## 2017-01-06 DIAGNOSIS — E748 Other specified disorders of carbohydrate metabolism: Secondary | ICD-10-CM | POA: Diagnosis not present

## 2017-01-06 DIAGNOSIS — D649 Anemia, unspecified: Secondary | ICD-10-CM | POA: Diagnosis not present

## 2017-01-26 ENCOUNTER — Ambulatory Visit: Payer: Medicare Other | Admitting: Cardiovascular Disease

## 2017-02-02 ENCOUNTER — Encounter: Payer: Self-pay | Admitting: Internal Medicine

## 2017-02-15 DIAGNOSIS — D485 Neoplasm of uncertain behavior of skin: Secondary | ICD-10-CM | POA: Diagnosis not present

## 2017-02-15 DIAGNOSIS — L821 Other seborrheic keratosis: Secondary | ICD-10-CM | POA: Diagnosis not present

## 2017-02-15 DIAGNOSIS — L820 Inflamed seborrheic keratosis: Secondary | ICD-10-CM | POA: Diagnosis not present

## 2017-03-23 DIAGNOSIS — E538 Deficiency of other specified B group vitamins: Secondary | ICD-10-CM | POA: Diagnosis not present

## 2017-03-23 DIAGNOSIS — G894 Chronic pain syndrome: Secondary | ICD-10-CM | POA: Diagnosis not present

## 2017-03-23 DIAGNOSIS — I10 Essential (primary) hypertension: Secondary | ICD-10-CM | POA: Diagnosis not present

## 2017-03-23 DIAGNOSIS — K219 Gastro-esophageal reflux disease without esophagitis: Secondary | ICD-10-CM | POA: Diagnosis not present

## 2017-03-23 DIAGNOSIS — Z6827 Body mass index (BMI) 27.0-27.9, adult: Secondary | ICD-10-CM | POA: Diagnosis not present

## 2017-04-23 DIAGNOSIS — N183 Chronic kidney disease, stage 3 (moderate): Secondary | ICD-10-CM | POA: Diagnosis not present

## 2017-04-23 DIAGNOSIS — E669 Obesity, unspecified: Secondary | ICD-10-CM | POA: Diagnosis not present

## 2017-04-23 DIAGNOSIS — R809 Proteinuria, unspecified: Secondary | ICD-10-CM | POA: Diagnosis not present

## 2017-04-23 DIAGNOSIS — I10 Essential (primary) hypertension: Secondary | ICD-10-CM | POA: Diagnosis not present

## 2017-04-23 DIAGNOSIS — E875 Hyperkalemia: Secondary | ICD-10-CM | POA: Diagnosis not present

## 2017-05-03 ENCOUNTER — Other Ambulatory Visit (HOSPITAL_COMMUNITY): Payer: Self-pay | Admitting: Nephrology

## 2017-05-03 DIAGNOSIS — N183 Chronic kidney disease, stage 3 unspecified: Secondary | ICD-10-CM

## 2017-05-18 ENCOUNTER — Ambulatory Visit (HOSPITAL_COMMUNITY)
Admission: RE | Admit: 2017-05-18 | Discharge: 2017-05-18 | Disposition: A | Discharge status: Home / Self Care | Payer: Medicare Other | Source: Ambulatory Visit | Attending: Nephrology | Admitting: Nephrology

## 2017-05-18 DIAGNOSIS — E559 Vitamin D deficiency, unspecified: Secondary | ICD-10-CM | POA: Diagnosis not present

## 2017-05-18 DIAGNOSIS — I10 Essential (primary) hypertension: Secondary | ICD-10-CM | POA: Diagnosis not present

## 2017-05-18 DIAGNOSIS — Z1159 Encounter for screening for other viral diseases: Secondary | ICD-10-CM | POA: Diagnosis not present

## 2017-05-18 DIAGNOSIS — D509 Iron deficiency anemia, unspecified: Secondary | ICD-10-CM | POA: Diagnosis not present

## 2017-05-18 DIAGNOSIS — N183 Chronic kidney disease, stage 3 unspecified: Secondary | ICD-10-CM

## 2017-05-18 DIAGNOSIS — Z79899 Other long term (current) drug therapy: Secondary | ICD-10-CM | POA: Diagnosis not present

## 2017-05-18 DIAGNOSIS — D519 Vitamin B12 deficiency anemia, unspecified: Secondary | ICD-10-CM | POA: Diagnosis not present

## 2017-05-18 DIAGNOSIS — R809 Proteinuria, unspecified: Secondary | ICD-10-CM | POA: Diagnosis not present

## 2017-06-02 DIAGNOSIS — E559 Vitamin D deficiency, unspecified: Secondary | ICD-10-CM | POA: Diagnosis not present

## 2017-06-02 DIAGNOSIS — N183 Chronic kidney disease, stage 3 (moderate): Secondary | ICD-10-CM | POA: Diagnosis not present

## 2017-06-18 DIAGNOSIS — G894 Chronic pain syndrome: Secondary | ICD-10-CM | POA: Diagnosis not present

## 2017-06-18 DIAGNOSIS — R109 Unspecified abdominal pain: Secondary | ICD-10-CM | POA: Diagnosis not present

## 2017-06-18 DIAGNOSIS — M1991 Primary osteoarthritis, unspecified site: Secondary | ICD-10-CM | POA: Diagnosis not present

## 2017-06-18 DIAGNOSIS — Z6825 Body mass index (BMI) 25.0-25.9, adult: Secondary | ICD-10-CM | POA: Diagnosis not present

## 2017-06-18 DIAGNOSIS — E663 Overweight: Secondary | ICD-10-CM | POA: Diagnosis not present

## 2017-06-18 DIAGNOSIS — D510 Vitamin B12 deficiency anemia due to intrinsic factor deficiency: Secondary | ICD-10-CM | POA: Diagnosis not present

## 2017-06-18 DIAGNOSIS — E538 Deficiency of other specified B group vitamins: Secondary | ICD-10-CM | POA: Diagnosis not present

## 2017-06-18 DIAGNOSIS — R1013 Epigastric pain: Secondary | ICD-10-CM | POA: Diagnosis not present

## 2017-06-28 DIAGNOSIS — K839 Disease of biliary tract, unspecified: Secondary | ICD-10-CM | POA: Diagnosis not present

## 2017-06-28 DIAGNOSIS — K8020 Calculus of gallbladder without cholecystitis without obstruction: Secondary | ICD-10-CM | POA: Diagnosis not present

## 2017-06-28 DIAGNOSIS — R1011 Right upper quadrant pain: Secondary | ICD-10-CM | POA: Diagnosis not present

## 2017-07-02 DIAGNOSIS — R109 Unspecified abdominal pain: Secondary | ICD-10-CM | POA: Diagnosis not present

## 2017-07-02 DIAGNOSIS — K8020 Calculus of gallbladder without cholecystitis without obstruction: Secondary | ICD-10-CM | POA: Diagnosis not present

## 2017-07-02 DIAGNOSIS — R634 Abnormal weight loss: Secondary | ICD-10-CM | POA: Diagnosis not present

## 2017-07-15 ENCOUNTER — Ambulatory Visit (INDEPENDENT_AMBULATORY_CARE_PROVIDER_SITE_OTHER): Payer: Medicare Other | Admitting: General Surgery

## 2017-07-15 ENCOUNTER — Encounter: Payer: Self-pay | Admitting: General Surgery

## 2017-07-15 VITALS — BP 134/71 | HR 78 | Temp 96.6°F | Resp 18 | Ht 74.0 in | Wt 181.0 lb

## 2017-07-15 DIAGNOSIS — K8020 Calculus of gallbladder without cholecystitis without obstruction: Secondary | ICD-10-CM | POA: Diagnosis not present

## 2017-07-15 NOTE — Progress Notes (Signed)
Travis Woodward; [id number]; 1941-08-24   HPI Patient is a 76 year old white male who was referred to my care by Dr. Gerarda Fraction for evaluation treatment of gallstones. Patient has had worsening nausea and vomiting over the past few weeks. It is made worse with fatty foods. He has not had right upper quadrant abdominal pain, fever, chills, jaundice. He is currently pain-free. His appetite is decreased. An ultrasound was performed which revealed cholelithiasis with a slightly thickened gallbladder wall. He did have a HIDA scan which showed a borderline gallbladder ejection fraction. Past Medical History:  Diagnosis Date  . Coronary artery disease   . Hyperlipidemia   . Hypertension     Past Surgical History:  Procedure Laterality Date  . CARDIAC CATHETERIZATION  2005  . CORONARY STENT PLACEMENT    . LAPAROSCOPIC APPENDECTOMY  04/17/2012   Procedure: APPENDECTOMY LAPAROSCOPIC;  Surgeon: Jamesetta So, MD;  Location: [facility];  Service: General;  Laterality: N/A;    Family History  Problem Relation Age of Onset  . Heart attack Mother     Current Outpatient Prescriptions on File Prior to Visit  Medication Sig Dispense Refill  . aspirin EC 81 MG tablet Take 81 mg by mouth daily.    . clopidogrel (PLAVIX) 75 MG tablet Take 75 mg by mouth daily.    . Cyanocobalamin (VITAMIN B-12 IJ) Inject as directed every 30 (thirty) days.    . hydrochlorothiazide (MICROZIDE) 12.5 MG capsule Take 1 capsule by mouth daily.    Marland Kitchen oxyCODONE (ROXICODONE) 15 MG immediate release tablet Take 15 mg by mouth every 4 (four) hours as needed. pain     No current facility-administered medications on file prior to visit.     Allergies  Allergen Reactions  . Crestor [Rosuvastatin]   . Paroxetine Rash    History  Alcohol Use No    History  Smoking Status  . Former Smoker  Smokeless Tobacco  . Never Used    Review of Systems  Constitutional: Negative.   HENT: Positive for sinus pain.   Eyes: Negative.    Respiratory: Negative.   Cardiovascular: Negative.   Gastrointestinal: Positive for nausea and vomiting.  Genitourinary: Negative.   Musculoskeletal: Positive for back pain, joint pain and neck pain.  Skin: Negative.   Neurological: Positive for focal weakness.  Endo/Heme/Allergies: Bruises/bleeds easily.  Psychiatric/Behavioral: Negative.     Objective   Vitals:   07/15/17 1054  BP: 134/71  Pulse: 78  Resp: 18  Temp: (!) 96.6 F (35.9 C)    Physical Exam  Constitutional: He is oriented to person, place, and time and well-developed, well-nourished, and in no distress.  HENT:  Head: Normocephalic and atraumatic.  Eyes: No scleral icterus.  Neck: Normal range of motion. Neck supple.  Cardiovascular: Normal rate, regular rhythm and normal heart sounds.   No murmur heard. Pulmonary/Chest: Effort normal and breath sounds normal. He has no wheezes. He has no rales.  Abdominal: Soft. Bowel sounds are normal. He exhibits no distension. There is no tenderness. There is no rebound.  Neurological: He is alert and oriented to person, place, and time.  Skin: Skin is warm and dry.  Vitals reviewed.  Dr. Nolon Rod notes reviewed. Ultrasound report reviewed. Assessment  Biliary colic, cholelithiasis Plan   Patient scheduled for laparoscopic cholecystectomy on 07/19/2017. The risks and benefits of the procedure including bleeding, infection, cardiopulmonary difficulties, hepatobiliary injury, and the possibility of an open procedure were fully explained to the patient, who gave informed consent. He will stop  his Plavix today. He may continue baby aspirin.

## 2017-07-15 NOTE — Patient Instructions (Signed)

## 2017-07-15 NOTE — H&P (Signed)
Travis Woodward; [id number]; September 09, 1941   HPI Patient is a 76 year old white male who was referred to my care by Dr. Gerarda Fraction for evaluation treatment of gallstones. Patient has had worsening nausea and vomiting over the past few weeks. It is made worse with fatty foods. He has not had right upper quadrant abdominal pain, fever, chills, jaundice. He is currently pain-free. His appetite is decreased. An ultrasound was performed which revealed cholelithiasis with a slightly thickened gallbladder wall. He did have a HIDA scan which showed a borderline gallbladder ejection fraction. Past Medical History:  Diagnosis Date  . Coronary artery disease   . Hyperlipidemia   . Hypertension     Past Surgical History:  Procedure Laterality Date  . CARDIAC CATHETERIZATION  2005  . CORONARY STENT PLACEMENT    . LAPAROSCOPIC APPENDECTOMY  04/17/2012   Procedure: APPENDECTOMY LAPAROSCOPIC;  Surgeon: Jamesetta So, MD;  Location: [facility];  Service: General;  Laterality: N/A;    Family History  Problem Relation Age of Onset  . Heart attack Mother     Current Outpatient Prescriptions on File Prior to Visit  Medication Sig Dispense Refill  . aspirin EC 81 MG tablet Take 81 mg by mouth daily.    . clopidogrel (PLAVIX) 75 MG tablet Take 75 mg by mouth daily.    . Cyanocobalamin (VITAMIN B-12 IJ) Inject as directed every 30 (thirty) days.    . hydrochlorothiazide (MICROZIDE) 12.5 MG capsule Take 1 capsule by mouth daily.    Marland Kitchen oxyCODONE (ROXICODONE) 15 MG immediate release tablet Take 15 mg by mouth every 4 (four) hours as needed. pain     No current facility-administered medications on file prior to visit.     Allergies  Allergen Reactions  . Crestor [Rosuvastatin]   . Paroxetine Rash    History  Alcohol Use No    History  Smoking Status  . Former Smoker  Smokeless Tobacco  . Never Used    Review of Systems  Constitutional: Negative.   HENT: Positive for sinus pain.   Eyes: Negative.    Respiratory: Negative.   Cardiovascular: Negative.   Gastrointestinal: Positive for nausea and vomiting.  Genitourinary: Negative.   Musculoskeletal: Positive for back pain, joint pain and neck pain.  Skin: Negative.   Neurological: Positive for focal weakness.  Endo/Heme/Allergies: Bruises/bleeds easily.  Psychiatric/Behavioral: Negative.     Objective   Vitals:   07/15/17 1054  BP: 134/71  Pulse: 78  Resp: 18  Temp: (!) 96.6 F (35.9 C)    Physical Exam  Constitutional: He is oriented to person, place, and time and well-developed, well-nourished, and in no distress.  HENT:  Head: Normocephalic and atraumatic.  Eyes: No scleral icterus.  Neck: Normal range of motion. Neck supple.  Cardiovascular: Normal rate, regular rhythm and normal heart sounds.   No murmur heard. Pulmonary/Chest: Effort normal and breath sounds normal. He has no wheezes. He has no rales.  Abdominal: Soft. Bowel sounds are normal. He exhibits no distension. There is no tenderness. There is no rebound.  Neurological: He is alert and oriented to person, place, and time.  Skin: Skin is warm and dry.  Vitals reviewed.  Dr. Nolon Rod notes reviewed. Ultrasound report reviewed. Assessment  Biliary colic, cholelithiasis Plan   Patient scheduled for laparoscopic cholecystectomy on 07/19/2017. The risks and benefits of the procedure including bleeding, infection, cardiopulmonary difficulties, hepatobiliary injury, and the possibility of an open procedure were fully explained to the patient, who gave informed consent. He will stop  his Plavix today. He may continue baby aspirin.

## 2017-07-15 NOTE — Patient Instructions (Signed)
Travis Woodward  07/15/2017     @PREFPERIOPPHARMACY @   Your procedure is scheduled on  07/19/2017 .  Report to [facility] at  725  A.M.  Call this number if you have problems the morning of surgery:  [phone]   Remember:  Do not eat food or drink liquids after midnight.  Take these medicines the morning of surgery with A SIP OF WATER  Microzide, oxycodone.   Do not wear jewelry, make-up or nail polish.  Do not wear lotions, powders, or perfumes, or deoderant.  Do not shave 48 hours prior to surgery.  Men may shave face and neck.  Do not bring valuables to the hospital.  [facility] is not responsible for any belongings or valuables.  Contacts, dentures or bridgework may not be worn into surgery.  Leave your suitcase in the car.  After surgery it may be brought to your room.  For patients admitted to the hospital, discharge time will be determined by your treatment team.  Patients discharged the day of surgery will not be allowed to drive home.   Name and phone number of your driver:   family Special instructions:  None  Please read over the following fact sheets that you were given. Anesthesia Post-op Instructions and Care and Recovery After Surgery       Laparoscopic Cholecystectomy Laparoscopic cholecystectomy is surgery to remove the gallbladder. The gallbladder is a pear-shaped organ that lies beneath the liver on the right side of the body. The gallbladder stores bile, which is a fluid that helps the body to digest fats. Cholecystectomy is often done for inflammation of the gallbladder (cholecystitis). This condition is usually caused by a buildup of gallstones (cholelithiasis) in the gallbladder. Gallstones can block the flow of bile, which can result in inflammation and pain. In severe cases, emergency surgery may be required. This procedure is done though small incisions in your abdomen (laparoscopic surgery). A thin scope with a camera (laparoscope) is  inserted through one incision. Thin surgical instruments are inserted through the other incisions. In some cases, a laparoscopic procedure may be turned into a type of surgery that is done through a larger incision (open surgery). Tell a health care provider about:  Any allergies you have.  All medicines you are taking, including vitamins, herbs, eye drops, creams, and over-the-counter medicines.  Any problems you or family members have had with anesthetic medicines.  Any blood disorders you have.  Any surgeries you have had.  Any medical conditions you have.  Whether you are pregnant or may be pregnant. What are the risks? Generally, this is a safe procedure. However, problems may occur, including:  Infection.  Bleeding.  Allergic reactions to medicines.  Damage to other structures or organs.  A stone remaining in the common bile duct. The common bile duct carries bile from the gallbladder into the small intestine.  A bile leak from the cyst duct that is clipped when your gallbladder is removed.  What happens before the procedure? Staying hydrated Follow instructions from your health care provider about hydration, which may include:  Up to 2 hours before the procedure - you may continue to drink clear liquids, such as water, clear fruit juice, black coffee, and plain tea.  Eating and drinking restrictions Follow instructions from your health care provider about eating and drinking, which may include:  8 hours before the procedure - stop eating heavy meals or foods such as meat,  fried foods, or fatty foods.  6 hours before the procedure - stop eating light meals or foods, such as toast or cereal.  6 hours before the procedure - stop drinking milk or drinks that contain milk.  2 hours before the procedure - stop drinking clear liquids.  Medicines  Ask your health care provider about: ? Changing or stopping your regular medicines. This is especially important if you  are taking diabetes medicines or blood thinners. ? Taking medicines such as aspirin and ibuprofen. These medicines can thin your blood. Do not take these medicines before your procedure if your health care provider instructs you not to.  You may be given antibiotic medicine to help prevent infection. General instructions  Let your health care provider know if you develop a cold or an infection before surgery.  Plan to have someone take you home from the hospital or clinic.  Ask your health care provider how your surgical site will be marked or identified. What happens during the procedure?  To reduce your risk of infection: ? Your health care team will wash or sanitize their hands. ? Your skin will be washed with soap. ? Hair may be removed from the surgical area.  An IV tube may be inserted into one of your veins.  You will be given one or more of the following: ? A medicine to help you relax (sedative). ? A medicine to make you fall asleep (general anesthetic).  A breathing tube will be placed in your mouth.  Your surgeon will make several small cuts (incisions) in your abdomen.  The laparoscope will be inserted through one of the small incisions. The camera on the laparoscope will send images to a TV screen (monitor) in the operating room. This lets your surgeon see inside your abdomen.  Air-like gas will be pumped into your abdomen. This will expand your abdomen to give the surgeon more room to perform the surgery.  Other tools that are needed for the procedure will be inserted through the other incisions. The gallbladder will be removed through one of the incisions.  Your common bile duct may be examined. If stones are found in the common bile duct, they may be removed.  After your gallbladder has been removed, the incisions will be closed with stitches (sutures), staples, or skin glue.  Your incisions may be covered with a bandage (dressing). The procedure may vary among  health care providers and hospitals. What happens after the procedure?  Your blood pressure, heart rate, breathing rate, and blood oxygen level will be monitored until the medicines you were given have worn off.  You will be given medicines as needed to control your pain.  Do not drive for 24 hours if you were given a sedative. This information is not intended to replace advice given to you by your health care provider. Make sure you discuss any questions you have with your health care provider. Document Released: 12/14/2005 Document Revised: 07/05/2016 Document Reviewed: 06/01/2016 Elsevier Interactive Patient Education  2018 [employer].  Laparoscopic Cholecystectomy, Care After This sheet gives you information about how to care for yourself after your procedure. Your doctor may also give you more specific instructions. If you have problems or questions, contact your doctor. Follow these instructions at home: Care for cuts from surgery (incisions)   Follow instructions from your doctor about how to take care of your cuts from surgery. Make sure you: ? Wash your hands with soap and water before you change your bandage (  dressing). If you cannot use soap and water, use hand sanitizer. ? Change your bandage as told by your doctor. ? Leave stitches (sutures), skin glue, or skin tape (adhesive) strips in place. They may need to stay in place for 2 weeks or longer. If tape strips get loose and curl up, you may trim the loose edges. Do not remove tape strips completely unless your doctor says it is okay.  Do not take baths, swim, or use a hot tub until your doctor says it is okay. Ask your doctor if you can take showers. You may only be allowed to take sponge baths for bathing.  Check your surgical cut area every day for signs of infection. Check for: ? More redness, swelling, or pain. ? More fluid or blood. ? Warmth. ? Pus or a bad smell. Activity  Do not drive or use heavy machinery  while taking prescription pain medicine.  Do not lift anything that is heavier than 10 lb (4.5 kg) until your doctor says it is okay.  Do not play contact sports until your doctor says it is okay.  Do not drive for 24 hours if you were given a medicine to help you relax (sedative).  Rest as needed. Do not return to work or school until your doctor says it is okay. General instructions  Take over-the-counter and prescription medicines only as told by your doctor.  To prevent or treat constipation while you are taking prescription pain medicine, your doctor may recommend that you: ? Drink enough fluid to keep your pee (urine) clear or pale yellow. ? Take over-the-counter or prescription medicines. ? Eat foods that are high in fiber, such as fresh fruits and vegetables, whole grains, and beans. ? Limit foods that are high in fat and processed sugars, such as fried and sweet foods. Contact a doctor if:  You develop a rash.  You have more redness, swelling, or pain around your surgical cuts.  You have more fluid or blood coming from your surgical cuts.  Your surgical cuts feel warm to the touch.  You have pus or a bad smell coming from your surgical cuts.  You have a fever.  One or more of your surgical cuts breaks open. Get help right away if:  You have trouble breathing.  You have chest pain.  You have pain that is getting worse in your shoulders.  You faint or feel dizzy when you stand.  You have very bad pain in your belly (abdomen).  You are sick to your stomach (nauseous) for more than one day.  You have throwing up (vomiting) that lasts for more than one day.  You have leg pain. This information is not intended to replace advice given to you by your health care provider. Make sure you discuss any questions you have with your health care provider. Document Released: 09/22/2008 Document Revised: 07/04/2016 Document Reviewed: 06/01/2016 Elsevier Interactive Patient  Education  2017 [facility] Anesthesia, Adult General anesthesia is the use of medicines to make a person "go to sleep" (be unconscious) for a medical procedure. General anesthesia is often recommended when a procedure:  Is long.  Requires you to be still or in an unusual position.  Is major and can cause you to lose blood.  Is impossible to do without general anesthesia.  The medicines used for general anesthesia are called general anesthetics. In addition to making you sleep, the medicines:  Prevent pain.  Control your blood pressure.  Relax your  muscles.  Tell a health care provider about:  Any allergies you have.  All medicines you are taking, including vitamins, herbs, eye drops, creams, and over-the-counter medicines.  Any problems you or family members have had with anesthetic medicines.  Types of anesthetics you have had in the past.  Any bleeding disorders you have.  Any surgeries you have had.  Any medical conditions you have.  Any history of heart or lung conditions, such as heart failure, sleep apnea, or chronic obstructive pulmonary disease (COPD).  Whether you are pregnant or may be pregnant.  Whether you use tobacco, alcohol, marijuana, or street drugs.  Any history of Armed forces logistics/support/administrative officer.  Any history of depression or anxiety. What are the risks? Generally, this is a safe procedure. However, problems may occur, including:  Allergic reaction to anesthetics.  Lung and heart problems.  Inhaling food or liquids from your stomach into your lungs (aspiration).  Injury to nerves.  Waking up during your procedure and being unable to move (rare).  Extreme agitation or a state of mental confusion (delirium) when you wake up from the anesthetic.  Air in the bloodstream, which can lead to stroke.  These problems are more likely to develop if you are having a major surgery or if you have an advanced medical condition. You can prevent some of  these complications by answering all of your health care provider's questions thoroughly and by following all pre-procedure instructions. General anesthesia can cause side effects, including:  Nausea or vomiting  A sore throat from the breathing tube.  Feeling cold or shivery.  Feeling tired, washed out, or achy.  Sleepiness or drowsiness.  Confusion or agitation.  What happens before the procedure? Staying hydrated Follow instructions from your health care provider about hydration, which may include:  Up to 2 hours before the procedure - you may continue to drink clear liquids, such as water, clear fruit juice, black coffee, and plain tea.  Eating and drinking restrictions Follow instructions from your health care provider about eating and drinking, which may include:  8 hours before the procedure - stop eating heavy meals or foods such as meat, fried foods, or fatty foods.  6 hours before the procedure - stop eating light meals or foods, such as toast or cereal.  6 hours before the procedure - stop drinking milk or drinks that contain milk.  2 hours before the procedure - stop drinking clear liquids.  Medicines  Ask your health care provider about: ? Changing or stopping your regular medicines. This is especially important if you are taking diabetes medicines or blood thinners. ? Taking medicines such as aspirin and ibuprofen. These medicines can thin your blood. Do not take these medicines before your procedure if your health care provider instructs you not to. ? Taking new dietary supplements or medicines. Do not take these during the week before your procedure unless your health care provider approves them.  If you are told to take a medicine or to continue taking a medicine on the day of the procedure, take the medicine with sips of water. General instructions   Ask if you will be going home the same day, the following day, or after a longer hospital stay. ? Plan to  have someone take you home. ? Plan to have someone stay with you for the first 24 hours after you leave the hospital or clinic.  For 3-6 weeks before the procedure, try not to use any tobacco products, such as cigarettes, chewing tobacco,  and e-cigarettes.  You may brush your teeth on the morning of the procedure, but make sure to spit out the toothpaste. What happens during the procedure?  You will be given anesthetics through a mask and through an IV tube in one of your veins.  You may receive medicine to help you relax (sedative).  As soon as you are asleep, a breathing tube may be used to help you breathe.  An anesthesia specialist will stay with you throughout the procedure. He or she will help keep you comfortable and safe by continuing to give you medicines and adjusting the amount of medicine that you get. He or she will also watch your blood pressure, pulse, and oxygen levels to make sure that the anesthetics do not cause any problems.  If a breathing tube was used to help you breathe, it will be removed before you wake up. The procedure may vary among health care providers and hospitals. What happens after the procedure?  You will wake up, often slowly, after the procedure is complete, usually in a recovery area.  Your blood pressure, heart rate, breathing rate, and blood oxygen level will be monitored until the medicines you were given have worn off.  You may be given medicine to help you calm down if you feel anxious or agitated.  If you will be going home the same day, your health care provider may check to make sure you can stand, drink, and urinate.  Your health care providers will treat your pain and side effects before you go home.  Do not drive for 24 hours if you received a sedative.  You may: ? Feel nauseous and vomit. ? Have a sore throat. ? Have mental slowness. ? Feel cold or shivery. ? Feel sleepy. ? Feel tired. ? Feel sore or achy, even in parts of your  body where you did not have surgery. This information is not intended to replace advice given to you by your health care provider. Make sure you discuss any questions you have with your health care provider. Document Released: 03/22/2008 Document Revised: 05/26/2016 Document Reviewed: 11/28/2015 Elsevier Interactive Patient Education  2018 [place] Anesthesia, Adult, Care After These instructions provide you with information about caring for yourself after your procedure. Your health care provider may also give you more specific instructions. Your treatment has been planned according to current medical practices, but problems sometimes occur. Call your health care provider if you have any problems or questions after your procedure. What can I expect after the procedure? After the procedure, it is common to have:  Vomiting.  A sore throat.  Mental slowness.  It is common to feel:  Nauseous.  Cold or shivery.  Sleepy.  Tired.  Sore or achy, even in parts of your body where you did not have surgery.  Follow these instructions at home: For at least 24 hours after the procedure:  Do not: ? Participate in activities where you could fall or become injured. ? Drive. ? Use heavy machinery. ? Drink alcohol. ? Take sleeping pills or medicines that cause drowsiness. ? Make important decisions or sign legal documents. ? Take care of children on your own.  Rest. Eating and drinking  If you vomit, drink water, juice, or soup when you can drink without vomiting.  Drink enough fluid to keep your urine clear or pale yellow.  Make sure you have little or no nausea before eating solid foods.  Follow the diet recommended by your health care  provider. General instructions  Have a responsible adult stay with you until you are awake and alert.  Return to your normal activities as told by your health care provider. Ask your health care provider what activities are safe for  you.  Take over-the-counter and prescription medicines only as told by your health care provider.  If you smoke, do not smoke without supervision.  Keep all follow-up visits as told by your health care provider. This is important. Contact a health care provider if:  You continue to have nausea or vomiting at home, and medicines are not helpful.  You cannot drink fluids or start eating again.  You cannot urinate after 8-12 hours.  You develop a skin rash.  You have fever.  You have increasing redness at the site of your procedure. Get help right away if:  You have difficulty breathing.  You have chest pain.  You have unexpected bleeding.  You feel that you are having a life-threatening or urgent problem. This information is not intended to replace advice given to you by your health care provider. Make sure you discuss any questions you have with your health care provider. Document Released: 03/22/2001 Document Revised: 05/18/2016 Document Reviewed: 11/28/2015 Elsevier Interactive Patient Education  [employer].

## 2017-07-16 ENCOUNTER — Encounter (HOSPITAL_COMMUNITY)
Admission: RE | Admit: 2017-07-16 | Discharge: 2017-07-16 | Disposition: A | Discharge status: Home / Self Care | Payer: Medicare Other | Source: Ambulatory Visit | Attending: General Surgery | Admitting: General Surgery

## 2017-07-16 ENCOUNTER — Encounter (HOSPITAL_COMMUNITY): Payer: Self-pay

## 2017-07-16 ENCOUNTER — Other Ambulatory Visit: Payer: Self-pay

## 2017-07-16 DIAGNOSIS — Z87891 Personal history of nicotine dependence: Secondary | ICD-10-CM | POA: Diagnosis not present

## 2017-07-16 DIAGNOSIS — Z955 Presence of coronary angioplasty implant and graft: Secondary | ICD-10-CM | POA: Diagnosis not present

## 2017-07-16 DIAGNOSIS — K8010 Calculus of gallbladder with chronic cholecystitis without obstruction: Secondary | ICD-10-CM | POA: Diagnosis not present

## 2017-07-16 DIAGNOSIS — Z6824 Body mass index (BMI) 24.0-24.9, adult: Secondary | ICD-10-CM | POA: Diagnosis not present

## 2017-07-16 DIAGNOSIS — Z0000 Encounter for general adult medical examination without abnormal findings: Secondary | ICD-10-CM | POA: Diagnosis not present

## 2017-07-16 DIAGNOSIS — Z79899 Other long term (current) drug therapy: Secondary | ICD-10-CM | POA: Diagnosis not present

## 2017-07-16 DIAGNOSIS — Z7982 Long term (current) use of aspirin: Secondary | ICD-10-CM | POA: Diagnosis not present

## 2017-07-16 DIAGNOSIS — E785 Hyperlipidemia, unspecified: Secondary | ICD-10-CM | POA: Diagnosis not present

## 2017-07-16 DIAGNOSIS — I10 Essential (primary) hypertension: Secondary | ICD-10-CM | POA: Diagnosis not present

## 2017-07-16 DIAGNOSIS — I2510 Atherosclerotic heart disease of native coronary artery without angina pectoris: Secondary | ICD-10-CM | POA: Diagnosis not present

## 2017-07-16 HISTORY — DX: Adverse effect of unspecified anesthetic, initial encounter: T41.45XA

## 2017-07-16 HISTORY — DX: Other complications of anesthesia, initial encounter: T88.59XA

## 2017-07-16 HISTORY — DX: Chronic kidney disease, unspecified: N18.9

## 2017-07-16 LAB — CBC WITH DIFFERENTIAL/PLATELET
BASOS ABS: 0.1 10*3/uL (ref 0.0–0.1)
Basophils Relative: 2 %
Eosinophils Absolute: 0.4 10*3/uL (ref 0.0–0.7)
Eosinophils Relative: 7 %
HEMATOCRIT: 40 % (ref 39.0–52.0)
Hemoglobin: 13.1 g/dL (ref 13.0–17.0)
LYMPHS PCT: 23 %
Lymphs Abs: 1.5 10*3/uL (ref 0.7–4.0)
MCH: 29.7 pg (ref 26.0–34.0)
MCHC: 32.8 g/dL (ref 30.0–36.0)
MCV: 90.7 fL (ref 78.0–100.0)
Monocytes Absolute: 0.7 10*3/uL (ref 0.1–1.0)
Monocytes Relative: 10 %
NEUTROS ABS: 3.8 10*3/uL (ref 1.7–7.7)
Neutrophils Relative %: 59 %
PLATELETS: 116 10*3/uL — AB (ref 150–400)
RBC: 4.41 MIL/uL (ref 4.22–5.81)
RDW: 15.7 % — ABNORMAL HIGH (ref 11.5–15.5)
WBC: 6.5 10*3/uL (ref 4.0–10.5)

## 2017-07-16 LAB — COMPREHENSIVE METABOLIC PANEL
ALT: 17 U/L (ref 17–63)
AST: 20 U/L (ref 15–41)
Albumin: 3.5 g/dL (ref 3.5–5.0)
Alkaline Phosphatase: 88 U/L (ref 38–126)
Anion gap: 8 (ref 5–15)
BILIRUBIN TOTAL: 0.6 mg/dL (ref 0.3–1.2)
BUN: 18 mg/dL (ref 6–20)
CHLORIDE: 96 mmol/L — AB (ref 101–111)
CO2: 33 mmol/L — ABNORMAL HIGH (ref 22–32)
CREATININE: 1.63 mg/dL — AB (ref 0.61–1.24)
Calcium: 9.4 mg/dL (ref 8.9–10.3)
GFR calc Af Amer: 46 mL/min — ABNORMAL LOW (ref >60)
GFR, EST NON AFRICAN AMERICAN: 39 mL/min — AB (ref >60)
GLUCOSE: 107 mg/dL — AB (ref 65–99)
Potassium: 4 mmol/L (ref 3.5–5.1)
Sodium: 137 mmol/L (ref 135–145)
Total Protein: 6.7 g/dL (ref 6.5–8.1)

## 2017-07-19 ENCOUNTER — Ambulatory Visit (HOSPITAL_COMMUNITY): Payer: Medicare Other | Admitting: Anesthesiology

## 2017-07-19 ENCOUNTER — Encounter (HOSPITAL_COMMUNITY): Payer: Self-pay | Admitting: *Deleted

## 2017-07-19 ENCOUNTER — Encounter (HOSPITAL_COMMUNITY)
Admission: RE | Disposition: A | Discharge status: Home / Self Care | Payer: Self-pay | Source: Ambulatory Visit | Attending: General Surgery

## 2017-07-19 ENCOUNTER — Ambulatory Visit (HOSPITAL_COMMUNITY)
Admission: RE | Admit: 2017-07-19 | Discharge: 2017-07-19 | Disposition: A | Discharge status: Home / Self Care | Payer: Medicare Other | Source: Ambulatory Visit | Attending: General Surgery | Admitting: General Surgery

## 2017-07-19 DIAGNOSIS — Z79899 Other long term (current) drug therapy: Secondary | ICD-10-CM | POA: Insufficient documentation

## 2017-07-19 DIAGNOSIS — Z955 Presence of coronary angioplasty implant and graft: Secondary | ICD-10-CM | POA: Diagnosis not present

## 2017-07-19 DIAGNOSIS — K8010 Calculus of gallbladder with chronic cholecystitis without obstruction: Secondary | ICD-10-CM | POA: Insufficient documentation

## 2017-07-19 DIAGNOSIS — K8020 Calculus of gallbladder without cholecystitis without obstruction: Secondary | ICD-10-CM | POA: Diagnosis not present

## 2017-07-19 DIAGNOSIS — Z87891 Personal history of nicotine dependence: Secondary | ICD-10-CM | POA: Insufficient documentation

## 2017-07-19 DIAGNOSIS — I2510 Atherosclerotic heart disease of native coronary artery without angina pectoris: Secondary | ICD-10-CM | POA: Diagnosis not present

## 2017-07-19 DIAGNOSIS — E785 Hyperlipidemia, unspecified: Secondary | ICD-10-CM | POA: Insufficient documentation

## 2017-07-19 DIAGNOSIS — Z7982 Long term (current) use of aspirin: Secondary | ICD-10-CM | POA: Diagnosis not present

## 2017-07-19 DIAGNOSIS — I10 Essential (primary) hypertension: Secondary | ICD-10-CM | POA: Insufficient documentation

## 2017-07-19 HISTORY — PX: CHOLECYSTECTOMY: SHX55

## 2017-07-19 SURGERY — LAPAROSCOPIC CHOLECYSTECTOMY
Anesthesia: General

## 2017-07-19 MED ORDER — MIDAZOLAM HCL 2 MG/2ML IJ SOLN
INTRAMUSCULAR | Status: AC
  Filled 2017-07-19: qty 2

## 2017-07-19 MED ORDER — NEOSTIGMINE METHYLSULFATE 10 MG/10ML IV SOLN
INTRAVENOUS | Status: DC | PRN
  Administered 2017-07-19: 4 mg via INTRAMUSCULAR

## 2017-07-19 MED ORDER — KETOROLAC TROMETHAMINE 30 MG/ML IJ SOLN
30.0000 mg | Freq: Once | INTRAMUSCULAR | Status: AC
  Administered 2017-07-19: 30 mg via INTRAVENOUS

## 2017-07-19 MED ORDER — GLYCOPYRROLATE 0.2 MG/ML IJ SOLN
INTRAMUSCULAR | Status: DC | PRN
  Administered 2017-07-19: 0.6 mg via INTRAVENOUS

## 2017-07-19 MED ORDER — FENTANYL CITRATE (PF) 100 MCG/2ML IJ SOLN
INTRAMUSCULAR | Status: DC | PRN
  Administered 2017-07-19 (×7): 50 ug via INTRAVENOUS

## 2017-07-19 MED ORDER — POVIDONE-IODINE 10 % OINT PACKET
TOPICAL_OINTMENT | CUTANEOUS | Status: DC | PRN
  Administered 2017-07-19: 1 via TOPICAL

## 2017-07-19 MED ORDER — SUGAMMADEX SODIUM 500 MG/5ML IV SOLN
INTRAVENOUS | Status: DC | PRN
  Administered 2017-07-19: 150 mg via INTRAVENOUS

## 2017-07-19 MED ORDER — GLYCOPYRROLATE 0.2 MG/ML IJ SOLN
INTRAMUSCULAR | Status: AC
  Filled 2017-07-19: qty 3

## 2017-07-19 MED ORDER — NEOSTIGMINE METHYLSULFATE 10 MG/10ML IV SOLN
INTRAVENOUS | Status: AC
  Filled 2017-07-19: qty 1

## 2017-07-19 MED ORDER — BUPIVACAINE HCL (PF) 0.5 % IJ SOLN
INTRAMUSCULAR | Status: AC
  Filled 2017-07-19: qty 30

## 2017-07-19 MED ORDER — BUPIVACAINE HCL (PF) 0.5 % IJ SOLN
INTRAMUSCULAR | Status: DC | PRN
  Administered 2017-07-19: 10 mL

## 2017-07-19 MED ORDER — CIPROFLOXACIN IN D5W 400 MG/200ML IV SOLN
400.0000 mg | INTRAVENOUS | Status: AC
  Administered 2017-07-19: 400 mg via INTRAVENOUS
  Filled 2017-07-19: qty 200

## 2017-07-19 MED ORDER — DEXTROSE 5 % IV SOLN
INTRAVENOUS | Status: DC | PRN
  Administered 2017-07-19: 09:00:00 via INTRAVENOUS

## 2017-07-19 MED ORDER — EPHEDRINE SULFATE 50 MG/ML IJ SOLN
INTRAMUSCULAR | Status: DC | PRN
  Administered 2017-07-19: 10 mg via INTRAVENOUS

## 2017-07-19 MED ORDER — FENTANYL CITRATE (PF) 100 MCG/2ML IJ SOLN
INTRAMUSCULAR | Status: AC
  Filled 2017-07-19: qty 2

## 2017-07-19 MED ORDER — ROCURONIUM BROMIDE 50 MG/5ML IV SOLN
INTRAVENOUS | Status: AC
  Filled 2017-07-19: qty 1

## 2017-07-19 MED ORDER — POVIDONE-IODINE 10 % EX OINT
TOPICAL_OINTMENT | CUTANEOUS | Status: AC
  Filled 2017-07-19: qty 1

## 2017-07-19 MED ORDER — PROPOFOL 10 MG/ML IV BOLUS
INTRAVENOUS | Status: DC | PRN
Start: 2017-07-19 — End: 2017-07-19
  Administered 2017-07-19: 150 mg via INTRAVENOUS

## 2017-07-19 MED ORDER — MIDAZOLAM HCL 2 MG/2ML IJ SOLN
1.0000 mg | INTRAMUSCULAR | Status: AC
  Administered 2017-07-19: 2 mg via INTRAVENOUS

## 2017-07-19 MED ORDER — LIDOCAINE HCL (CARDIAC) 10 MG/ML IV SOLN
INTRAVENOUS | Status: DC | PRN
  Administered 2017-07-19: 40 mg via INTRAVENOUS

## 2017-07-19 MED ORDER — FENTANYL CITRATE (PF) 100 MCG/2ML IJ SOLN
25.0000 ug | INTRAMUSCULAR | Status: DC | PRN
  Filled 2017-07-19: qty 2

## 2017-07-19 MED ORDER — ONDANSETRON HCL 4 MG/2ML IJ SOLN
4.0000 mg | Freq: Once | INTRAMUSCULAR | Status: AC
  Administered 2017-07-19: 4 mg via INTRAVENOUS

## 2017-07-19 MED ORDER — SODIUM CHLORIDE 0.9 % IR SOLN
Status: DC | PRN
  Administered 2017-07-19: 1

## 2017-07-19 MED ORDER — CHLORHEXIDINE GLUCONATE CLOTH 2 % EX PADS: 6.0000 | MEDICATED_PAD | Freq: Once | CUTANEOUS | Status: DC

## 2017-07-19 MED ORDER — HEMOSTATIC AGENTS (NO CHARGE) OPTIME
TOPICAL | Status: DC | PRN
  Administered 2017-07-19: 1 via TOPICAL

## 2017-07-19 MED ORDER — PROPOFOL 10 MG/ML IV BOLUS
INTRAVENOUS | Status: AC
  Filled 2017-07-19: qty 40

## 2017-07-19 MED ORDER — KETOROLAC TROMETHAMINE 30 MG/ML IJ SOLN
INTRAMUSCULAR | Status: AC
  Filled 2017-07-19: qty 1

## 2017-07-19 MED ORDER — ONDANSETRON HCL 4 MG/2ML IJ SOLN
INTRAMUSCULAR | Status: AC
  Filled 2017-07-19: qty 2

## 2017-07-19 MED ORDER — LACTATED RINGERS IV SOLN
INTRAVENOUS | Status: DC
  Administered 2017-07-19 (×2): via INTRAVENOUS

## 2017-07-19 MED ORDER — ROCURONIUM 10MG/ML (10ML) SYRINGE FOR MEDFUSION PUMP - OPTIME
INTRAVENOUS | Status: DC | PRN
  Administered 2017-07-19: 35 mg via INTRAVENOUS
  Administered 2017-07-19: 10 mg via INTRAVENOUS

## 2017-07-19 MED ORDER — NEOSTIGMINE METHYLSULFATE 10 MG/10ML IV SOLN: INTRAVENOUS | Status: DC | PRN

## 2017-07-19 MED ORDER — LIDOCAINE HCL (PF) 1 % IJ SOLN
INTRAMUSCULAR | Status: AC
  Filled 2017-07-19: qty 5

## 2017-07-19 MED ORDER — FENTANYL CITRATE (PF) 250 MCG/5ML IJ SOLN
INTRAMUSCULAR | Status: AC
  Filled 2017-07-19: qty 5

## 2017-07-19 SURGICAL SUPPLY — 45 items
APPLIER CLIP LAPSCP 10X32 DD (CLIP) ×3 IMPLANT
BAG HAMPER (MISCELLANEOUS) ×3 IMPLANT
BAG RETRIEVAL 10 (BASKET) ×1
BAG RETRIEVAL 10MM (BASKET) ×1
CHLORAPREP W/TINT 26ML (MISCELLANEOUS) ×3 IMPLANT
CLOTH BEACON ORANGE TIMEOUT ST (SAFETY) ×3 IMPLANT
COVER LIGHT HANDLE STERIS (MISCELLANEOUS) ×6 IMPLANT
DECANTER SPIKE VIAL GLASS SM (MISCELLANEOUS) ×3 IMPLANT
ELECT REM PT RETURN 9FT ADLT (ELECTROSURGICAL) ×3
ELECTRODE REM PT RTRN 9FT ADLT (ELECTROSURGICAL) ×1 IMPLANT
FILTER SMOKE EVAC LAPAROSHD (FILTER) ×3 IMPLANT
FORMALIN 10 PREFIL 120ML (MISCELLANEOUS) ×3 IMPLANT
GLOVE BIOGEL PI IND STRL 7.0 (GLOVE) ×1 IMPLANT
GLOVE BIOGEL PI INDICATOR 7.0 (GLOVE) ×2
GLOVE SURG SS PI 7.5 STRL IVOR (GLOVE) ×3 IMPLANT
GOWN STRL REUS W/ TWL XL LVL3 (GOWN DISPOSABLE) ×1 IMPLANT
GOWN STRL REUS W/TWL LRG LVL3 (GOWN DISPOSABLE) ×6 IMPLANT
GOWN STRL REUS W/TWL XL LVL3 (GOWN DISPOSABLE) ×3
HEMOSTAT SNOW SURGICEL 2X4 (HEMOSTASIS) ×3 IMPLANT
INST SET LAPROSCOPIC AP (KITS) ×3 IMPLANT
IV NS IRRIG 3000ML ARTHROMATIC (IV SOLUTION) IMPLANT
KIT ROOM TURNOVER APOR (KITS) ×3 IMPLANT
MANIFOLD NEPTUNE II (INSTRUMENTS) ×3 IMPLANT
NDL INSUFFLATION 14GA 120MM (NEEDLE) ×1 IMPLANT
NEEDLE INSUFFLATION 14GA 120MM (NEEDLE) ×3 IMPLANT
NS IRRIG 1000ML POUR BTL (IV SOLUTION) ×3 IMPLANT
PACK LAP CHOLE LZT030E (CUSTOM PROCEDURE TRAY) ×3 IMPLANT
PAD ARMBOARD 7.5X6 YLW CONV (MISCELLANEOUS) ×3 IMPLANT
SET BASIN LINEN APH (SET/KITS/TRAYS/PACK) ×3 IMPLANT
SET TUBE IRRIG SUCTION NO TIP (IRRIGATION / IRRIGATOR) IMPLANT
SLEEVE ENDOPATH XCEL 5M (ENDOMECHANICALS) ×3 IMPLANT
SPONGE GAUZE 2X2 8PLY STER LF (GAUZE/BANDAGES/DRESSINGS) ×1
SPONGE GAUZE 2X2 8PLY STRL LF (GAUZE/BANDAGES/DRESSINGS) ×5 IMPLANT
STAPLER VISISTAT (STAPLE) ×3 IMPLANT
SUT VICRYL 0 UR6 27IN ABS (SUTURE) ×3 IMPLANT
SYS BAG RETRIEVAL 10MM (BASKET) ×1
SYSTEM BAG RETRIEVAL 10MM (BASKET) ×1 IMPLANT
TAPE PAPER 3X10 WHT MICROPORE (GAUZE/BANDAGES/DRESSINGS) ×2 IMPLANT
TROCAR ENDO BLADELESS 11MM (ENDOMECHANICALS) ×3 IMPLANT
TROCAR XCEL NON-BLD 5MMX100MML (ENDOMECHANICALS) ×3 IMPLANT
TROCAR XCEL UNIV SLVE 11M 100M (ENDOMECHANICALS) ×3 IMPLANT
TUBE CONNECTING 12'X1/4 (SUCTIONS) ×1
TUBE CONNECTING 12X1/4 (SUCTIONS) ×2 IMPLANT
TUBING INSUFFLATION (TUBING) ×3 IMPLANT
WARMER LAPAROSCOPE (MISCELLANEOUS) ×3 IMPLANT

## 2017-07-19 NOTE — Anesthesia Procedure Notes (Signed)
Procedure Name: Intubation Date/Time: 07/19/2017 9:11 AM Performed by: Andree Elk, AMY A Pre-anesthesia Checklist: Timeout performed, Patient identified, Emergency Drugs available, Suction available and Patient being monitored Patient Re-evaluated:Patient Re-evaluated prior to induction Oxygen Delivery Method: Circle System Utilized Preoxygenation: Pre-oxygenation with 100% oxygen Induction Type: IV induction Ventilation: Mask ventilation without difficulty Laryngoscope Size: Miller and 3 Grade View: Grade I Tube type: Oral Tube size: 7.0 mm Number of attempts: 1 Airway Equipment and Method: Stylet Placement Confirmation: ETT inserted through vocal cords under direct vision,  positive ETCO2 and breath sounds checked- equal and bilateral Secured at: 21 cm Tube secured with: Tape Dental Injury: Teeth and Oropharynx as per pre-operative assessment

## 2017-07-19 NOTE — Op Note (Signed)
Patient:  Travis Woodward.  DOB:  1941/06/22  MRN:  [id number]   Preop Diagnosis:  Biliary colic, cholelithiasis  Postop Diagnosis:  Same  Procedure:  Laparoscopic cholecystectomy  Surgeon:  Aviva Signs, M.D.  Anes:  Gen. endotracheal  Indications:  Patient is a 76 year old white male who presents with biliary colic secondary to cholelithiasis. The risks and benefits of the procedure including bleeding, infection, hepatobiliary injury, and the possibility of an open procedure were fully explained to the patient, who gave informed consent.  Procedure note:  The patient was placed in the supine position. After induction of general endotracheal anesthesia, the abdomen was prepped and draped using the usual sterile technique with DuraPrep. Surgical site confirmation was performed.  A supraumbilical incision was made down to the fascia. A Veress needle was introduced into the abdominal cavity and confirmation of placement was done using the saline drop test. The abdomen was then insufflated to 16 mmHg pressure. An 11 mm trocar was introduced into the abdominal cavity under direct visualization without difficulty. The patient was placed in reverse Trendelenburg position and an additional 11 mm trocar was placed the epigastric region and 5 mm trochars were placed the right upper quadrant and right flank regions. The liver was inspected and noted to be within normal limits. The gallbladder was retracted in a dynamic fashion in order to provide a critical view of the triangle of Calot. The cystic duct was first identified. Its juncture to the infundibulum was fully identified. Endoclips were placed proximally and distally on cystic duct, and the cystic duct was divided. This was likewise done to the cystic artery. The gallbladder was freed away from the gallbladder fossa using Bovie electrocautery. The gallbladder was delivered through the epigastric trocar site using an Endo Catch bag. The gallbladder  fossa was inspected and no abnormal bleeding or bile leakage was noted. Surgicel was placed the gallbladder fossa. All fluid and air were then evacuated from the abdominal cavity prior to the removal of the trochars.  All wounds were irrigated with normal saline. All wounds were injected with 0.5% Sensorcaine. The supraumbilical fascia as well as epigastric fascia were reapproximated using 0 Vicryl interrupted sutures. All skin incisions were closed using staples. Betadine ointment and dry sterile dressings were applied.  All tape and needle counts were correct at the end of procedure. The patient was extubated in the operating room and transferred to PACU in stable condition.  Complications:  None  EBL:  Minimal  Specimen:  Gallbladder

## 2017-07-19 NOTE — Discharge Instructions (Signed)
Laparoscopic Cholecystectomy, Care After °This sheet gives you information about how to care for yourself after your procedure. Your health care provider may also give you more specific instructions. If you have problems or questions, contact your health care provider. °What can I expect after the procedure? °After the procedure, it is common to have: °· Pain at your incision sites. You will be given medicines to control this pain. °· Mild nausea or vomiting. °· Bloating and possible shoulder pain from the air-like gas that was used during the procedure. °Follow these instructions at home: °Incision care  ° °· Follow instructions from your health care provider about how to take care of your incisions. Make sure you: °¨ Wash your hands with soap and water before you change your bandage (dressing). If soap and water are not available, use hand sanitizer. °¨ Change your dressing as told by your health care provider. °¨ Leave stitches (sutures), skin glue, or adhesive strips in place. These skin closures may need to be in place for 2 weeks or longer. If adhesive strip edges start to loosen and curl up, you may trim the loose edges. Do not remove adhesive strips completely unless your health care provider tells you to do that. °· Do not take baths, swim, or use a hot tub until your health care provider approves. Ask your health care provider if you can take showers. You may only be allowed to take sponge baths for bathing. °· Check your incision area every day for signs of infection. Check for: °¨ More redness, swelling, or pain. °¨ More fluid or blood. °¨ Warmth. °¨ Pus or a bad smell. °Activity  °· Do not drive or use heavy machinery while taking prescription pain medicine. °· Do not lift anything that is heavier than 10 lb (4.5 kg) until your health care provider approves. °· Do not play contact sports until your health care provider approves. °· Do not drive for 24 hours if you were given a medicine to help you relax  (sedative). °· Rest as needed. Do not return to work or school until your health care provider approves. °General instructions  °· Take over-the-counter and prescription medicines only as told by your health care provider. °· To prevent or treat constipation while you are taking prescription pain medicine, your health care provider may recommend that you: °¨ Drink enough fluid to keep your urine clear or pale yellow. °¨ Take over-the-counter or prescription medicines. °¨ Eat foods that are high in fiber, such as fresh fruits and vegetables, whole grains, and beans. °¨ Limit foods that are high in fat and processed sugars, such as fried and sweet foods. °Contact a health care provider if: °· You develop a rash. °· You have more redness, swelling, or pain around your incisions. °· You have more fluid or blood coming from your incisions. °· Your incisions feel warm to the touch. °· You have pus or a bad smell coming from your incisions. °· You have a fever. °· One or more of your incisions breaks open. °Get help right away if: °· You have trouble breathing. °· You have chest pain. °· You have increasing pain in your shoulders. °· You faint or feel dizzy when you stand. °· You have severe pain in your abdomen. °· You have nausea or vomiting that lasts for more than one day. °· You have leg pain. °This information is not intended to replace advice given to you by your health care provider. Make sure you discuss any   questions you have with your health care provider. °Document Released: 12/14/2005 Document Revised: 07/04/2016 Document Reviewed: 06/01/2016 °Elsevier Interactive Patient Education © 2017 [employer] ° °

## 2017-07-19 NOTE — Transfer of Care (Signed)
Immediate Anesthesia Transfer of Care Note  Patient: Travis Woodward.  Procedure(s) Performed: Procedure(s): LAPAROSCOPIC CHOLECYSTECTOMY (N/A)  Patient Location: PACU  Anesthesia Type:General  Level of Consciousness: awake, alert , oriented and patient cooperative  Airway & Oxygen Therapy: Patient Spontanous Breathing and Patient connected to face mask oxygen  Post-op Assessment: Report given to RN and Post -op Vital signs reviewed and stable  Post vital signs: Reviewed and stable  Last Vitals:  Vitals:   07/19/17 0800 07/19/17 0815  BP: 138/80 126/72  Resp: 13 14  Temp:      Last Pain:  Vitals:   07/19/17 0720  TempSrc: Oral      Patients Stated Pain Goal: 6 (25/63/89 3734)  Complications: No apparent anesthesia complications

## 2017-07-19 NOTE — Interval H&P Note (Signed)
History and Physical Interval Note:  07/19/2017 8:27 AM  Travis Woodward.  has presented today for surgery, with the diagnosis of cholelithiasis  The various methods of treatment have been discussed with the patient and family. After consideration of risks, benefits and other options for treatment, the patient has consented to  Procedure(s): LAPAROSCOPIC CHOLECYSTECTOMY (N/A) as a surgical intervention .  The patient's history has been reviewed, patient examined, no change in status, stable for surgery.  I have reviewed the patient's chart and labs.  Questions were answered to the patient's satisfaction.     Aviva Signs

## 2017-07-19 NOTE — Anesthesia Preprocedure Evaluation (Signed)
Anesthesia Evaluation  Patient identified by MRN, date of birth, ID band Patient awake    Reviewed: Allergy & Precautions, NPO status , Patient's Chart, lab work & pertinent test results  History of Anesthesia Complications (+) history of anesthetic complications ( "difficulty urinating ")  Airway Mallampati: II  TM Distance: >3 FB     Dental  (+) Poor Dentition, Edentulous Upper   Pulmonary former smoker,    breath sounds clear to auscultation       Cardiovascular hypertension, Pt. on medications + CAD and + Cardiac Stents   Rhythm:Regular Rate:Normal     Neuro/Psych  Neuromuscular disease ( SCIATICA , Lumbar HNP)    GI/Hepatic GERD  Medicated,  Endo/Other    Renal/GU Renal disease     Musculoskeletal   Abdominal   Peds  Hematology   Anesthesia Other Findings   Reproductive/Obstetrics                             Anesthesia Physical Anesthesia Plan  ASA: III  Anesthesia Plan: General   Post-op Pain Management:    Induction: Intravenous  PONV Risk Score and Plan:   Airway Management Planned: Oral ETT  Additional Equipment:   Intra-op Plan:   Post-operative Plan: Extubation in OR  Informed Consent: I have reviewed the patients History and Physical, chart, labs and discussed the procedure including the risks, benefits and alternatives for the proposed anesthesia with the patient or authorized representative who has indicated his/her understanding and acceptance.     Plan Discussed with:   Anesthesia Plan Comments:         Anesthesia Quick Evaluation

## 2017-07-19 NOTE — Anesthesia Postprocedure Evaluation (Signed)
Anesthesia Post Note  Patient: Travis Woodward.  Procedure(s) Performed: Procedure(s) (LRB): LAPAROSCOPIC CHOLECYSTECTOMY (N/A)  Patient location during evaluation: PACU Anesthesia Type: General Level of consciousness: awake and alert, oriented and patient cooperative Pain management: pain level controlled Vital Signs Assessment: post-procedure vital signs reviewed and stable Respiratory status: spontaneous breathing Cardiovascular status: stable Postop Assessment: no signs of nausea or vomiting Anesthetic complications: no     Last Vitals:  Vitals:   07/19/17 1030 07/19/17 1040  BP: 134/73   Pulse: 61 73  Resp: 10 11  Temp:      Last Pain:  Vitals:   07/19/17 1040  TempSrc:   PainSc: Asleep                 ADAMS, AMY A

## 2017-07-20 ENCOUNTER — Encounter (HOSPITAL_COMMUNITY): Payer: Self-pay | Admitting: General Surgery

## 2017-07-27 ENCOUNTER — Ambulatory Visit (INDEPENDENT_AMBULATORY_CARE_PROVIDER_SITE_OTHER): Payer: Self-pay | Admitting: General Surgery

## 2017-07-27 ENCOUNTER — Encounter: Payer: Self-pay | Admitting: General Surgery

## 2017-07-27 VITALS — BP 126/64 | Resp 20 | Ht 74.0 in | Wt 173.0 lb

## 2017-07-27 DIAGNOSIS — Z09 Encounter for follow-up examination after completed treatment for conditions other than malignant neoplasm: Secondary | ICD-10-CM

## 2017-07-27 NOTE — Progress Notes (Signed)
Subjective:     Travis Woodward.  Status post laparoscopic cholecystectomy. Doing well. Objective:    BP 126/64   Resp 20   Ht 6\' 2"  (1.88 m)   Wt 173 lb (78.5 kg)   BMI 22.21 kg/m   General:  alert, cooperative and no distress  Abdomen soft, incisions healing well. Staples removed, Steri-Strips applied. Final pathology consistent with diagnosis.     Assessment:    Doing well postoperatively.    Plan:   Incorrect activity as able, follow-up is needed.

## 2017-09-08 DIAGNOSIS — D485 Neoplasm of uncertain behavior of skin: Secondary | ICD-10-CM | POA: Diagnosis not present

## 2017-09-08 DIAGNOSIS — L0100 Impetigo, unspecified: Secondary | ICD-10-CM | POA: Diagnosis not present

## 2017-09-08 DIAGNOSIS — L570 Actinic keratosis: Secondary | ICD-10-CM | POA: Diagnosis not present

## 2017-09-30 DIAGNOSIS — K219 Gastro-esophageal reflux disease without esophagitis: Secondary | ICD-10-CM | POA: Diagnosis not present

## 2017-09-30 DIAGNOSIS — Z6821 Body mass index (BMI) 21.0-21.9, adult: Secondary | ICD-10-CM | POA: Diagnosis not present

## 2017-09-30 DIAGNOSIS — Z23 Encounter for immunization: Secondary | ICD-10-CM | POA: Diagnosis not present

## 2017-09-30 DIAGNOSIS — I10 Essential (primary) hypertension: Secondary | ICD-10-CM | POA: Diagnosis not present

## 2017-09-30 DIAGNOSIS — E538 Deficiency of other specified B group vitamins: Secondary | ICD-10-CM | POA: Diagnosis not present

## 2017-09-30 DIAGNOSIS — G894 Chronic pain syndrome: Secondary | ICD-10-CM | POA: Diagnosis not present

## 2017-09-30 DIAGNOSIS — M1991 Primary osteoarthritis, unspecified site: Secondary | ICD-10-CM | POA: Diagnosis not present

## 2017-11-09 DIAGNOSIS — G894 Chronic pain syndrome: Secondary | ICD-10-CM | POA: Diagnosis not present

## 2017-11-09 DIAGNOSIS — N183 Chronic kidney disease, stage 3 (moderate): Secondary | ICD-10-CM | POA: Diagnosis not present

## 2017-11-09 DIAGNOSIS — I10 Essential (primary) hypertension: Secondary | ICD-10-CM | POA: Diagnosis not present

## 2017-11-09 DIAGNOSIS — K219 Gastro-esophageal reflux disease without esophagitis: Secondary | ICD-10-CM | POA: Diagnosis not present

## 2017-11-09 DIAGNOSIS — Z6822 Body mass index (BMI) 22.0-22.9, adult: Secondary | ICD-10-CM | POA: Diagnosis not present

## 2017-11-09 DIAGNOSIS — E538 Deficiency of other specified B group vitamins: Secondary | ICD-10-CM | POA: Diagnosis not present

## 2018-01-18 DIAGNOSIS — M1991 Primary osteoarthritis, unspecified site: Secondary | ICD-10-CM | POA: Diagnosis not present

## 2018-01-18 DIAGNOSIS — G894 Chronic pain syndrome: Secondary | ICD-10-CM | POA: Diagnosis not present

## 2018-01-18 DIAGNOSIS — Z1389 Encounter for screening for other disorder: Secondary | ICD-10-CM | POA: Diagnosis not present

## 2018-01-18 DIAGNOSIS — J329 Chronic sinusitis, unspecified: Secondary | ICD-10-CM | POA: Diagnosis not present

## 2018-01-18 DIAGNOSIS — R351 Nocturia: Secondary | ICD-10-CM | POA: Diagnosis not present

## 2018-01-18 DIAGNOSIS — Z6822 Body mass index (BMI) 22.0-22.9, adult: Secondary | ICD-10-CM | POA: Diagnosis not present

## 2018-03-01 DIAGNOSIS — L209 Atopic dermatitis, unspecified: Secondary | ICD-10-CM | POA: Diagnosis not present

## 2018-03-01 DIAGNOSIS — E538 Deficiency of other specified B group vitamins: Secondary | ICD-10-CM | POA: Diagnosis not present

## 2018-03-01 DIAGNOSIS — Z1389 Encounter for screening for other disorder: Secondary | ICD-10-CM | POA: Diagnosis not present

## 2018-03-01 DIAGNOSIS — Z6824 Body mass index (BMI) 24.0-24.9, adult: Secondary | ICD-10-CM | POA: Diagnosis not present

## 2018-03-01 DIAGNOSIS — G894 Chronic pain syndrome: Secondary | ICD-10-CM | POA: Diagnosis not present

## 2018-03-01 DIAGNOSIS — M2550 Pain in unspecified joint: Secondary | ICD-10-CM | POA: Diagnosis not present

## 2018-03-01 DIAGNOSIS — M1991 Primary osteoarthritis, unspecified site: Secondary | ICD-10-CM | POA: Diagnosis not present

## 2018-05-08 ENCOUNTER — Encounter (HOSPITAL_COMMUNITY): Payer: Self-pay | Admitting: Emergency Medicine

## 2018-05-08 ENCOUNTER — Emergency Department (HOSPITAL_COMMUNITY)
Admission: EM | Admit: 2018-05-08 | Discharge: 2018-05-08 | Disposition: A | Discharge status: Home / Self Care | Payer: Medicare Other | Attending: Emergency Medicine | Admitting: Emergency Medicine

## 2018-05-08 ENCOUNTER — Other Ambulatory Visit: Payer: Self-pay

## 2018-05-08 DIAGNOSIS — R339 Retention of urine, unspecified: Secondary | ICD-10-CM

## 2018-05-08 DIAGNOSIS — I2510 Atherosclerotic heart disease of native coronary artery without angina pectoris: Secondary | ICD-10-CM | POA: Diagnosis not present

## 2018-05-08 DIAGNOSIS — Z7982 Long term (current) use of aspirin: Secondary | ICD-10-CM | POA: Insufficient documentation

## 2018-05-08 DIAGNOSIS — Z79899 Other long term (current) drug therapy: Secondary | ICD-10-CM | POA: Insufficient documentation

## 2018-05-08 DIAGNOSIS — Z955 Presence of coronary angioplasty implant and graft: Secondary | ICD-10-CM | POA: Insufficient documentation

## 2018-05-08 DIAGNOSIS — N183 Chronic kidney disease, stage 3 (moderate): Secondary | ICD-10-CM | POA: Insufficient documentation

## 2018-05-08 DIAGNOSIS — Z87891 Personal history of nicotine dependence: Secondary | ICD-10-CM | POA: Insufficient documentation

## 2018-05-08 DIAGNOSIS — I129 Hypertensive chronic kidney disease with stage 1 through stage 4 chronic kidney disease, or unspecified chronic kidney disease: Secondary | ICD-10-CM | POA: Diagnosis not present

## 2018-05-08 HISTORY — DX: Benign prostatic hyperplasia without lower urinary tract symptoms: N40.0

## 2018-05-08 LAB — URINALYSIS, ROUTINE W REFLEX MICROSCOPIC
BACTERIA UA: NONE SEEN
Bilirubin Urine: NEGATIVE
Glucose, UA: NEGATIVE mg/dL
Ketones, ur: NEGATIVE mg/dL
Leukocytes, UA: NEGATIVE
Nitrite: NEGATIVE
PH: 7 (ref 5.0–8.0)
Protein, ur: NEGATIVE mg/dL
SPECIFIC GRAVITY, URINE: 1.011 (ref 1.005–1.030)

## 2018-05-08 NOTE — Discharge Instructions (Signed)
Please call your urologist, if you do not have one see the phone number above.  He will need to be seen within the next week for a recheck.  They may be able to remove the catheter at that time.  If you should develop increasing pain fever or vomiting return to the emergency department.

## 2018-05-08 NOTE — ED Triage Notes (Signed)
Unable to urinate since 0300

## 2018-05-08 NOTE — ED Provider Notes (Signed)
[facility] EMERGENCY DEPARTMENT Provider Note   CSN: [id number] Arrival date & time: 05/08/18  1356     History   Chief Complaint Chief Complaint  Patient presents with  . Urinary Retention    HPI Travis Woodward. is a 77 y.o. male.  HPI  The patient is a 77 year old male, he presents to the hospital with a complaint of urinary retention  He reports this started at 3:00 in the morning when he got up to use the bathroom, he was unable to pass any urine.  He has been trying to pass urine since that time without anything coming out.  He has had a progressive discomfort in his lower abdomen, he has never had this type of retention in the past other than after having general anesthesia and requiring temporary Foley catheter placement postoperatively.  He is usually able to pass his urine well.  He does have an enlarged prostate but is never had any surgery or prostate cancer.  He denies any back pain fevers chills nausea vomiting or diarrhea and denies any constipation.  No recent new medications, no antihistamines or other anticholinergic exposures.  Past Medical History:  Diagnosis Date  . Chronic kidney disease    stage 3  . Complication of anesthesia    difficulty urinating   . Coronary artery disease   . Enlarged prostate   . Hyperlipidemia   . Hypertension     Patient Active Problem List   Diagnosis Date Noted  . Calculus of gallbladder without cholecystitis without obstruction   . Coronary artery disease 01/02/2015  . GERD 06/23/2010  . WEIGHT LOSS, ABNORMAL 06/23/2010  . NAUSEA, CHRONIC 06/23/2010  . VERTEBRAL FRACTURE, THORACIC SPINE 07/16/2008  . H N P-LUMBAR 06/25/2008  . LOW BACK PAIN 06/25/2008  . SCIATICA 06/25/2008  . LUMBAR SPASM 06/25/2008    Past Surgical History:  Procedure Laterality Date  . CARDIAC CATHETERIZATION  2005  . CHOLECYSTECTOMY N/A 07/19/2017   Procedure: LAPAROSCOPIC CHOLECYSTECTOMY;  Surgeon: Aviva Signs, MD;  Location: [facility];   Service: General;  Laterality: N/A;  . CORONARY STENT PLACEMENT  2005  . LAPAROSCOPIC APPENDECTOMY  04/17/2012   Procedure: APPENDECTOMY LAPAROSCOPIC;  Surgeon: Jamesetta So, MD;  Location: [facility];  Service: General;  Laterality: N/A;        Home Medications    Prior to Admission medications   Medication Sig Start Date End Date Taking? Authorizing Provider  aspirin EC 81 MG tablet Take 81 mg by mouth daily.    [provider]  cholecalciferol (VITAMIN D) 1000 units tablet Take 1,000 Units by mouth daily.    [provider]  clopidogrel (PLAVIX) 75 MG tablet Take 75 mg by mouth daily.    [provider]  Cyanocobalamin (VITAMIN B-12 IJ) Inject 1 mL as directed every 30 (thirty) days.     [provider]  Fluocinonide Emulsified Base 0.05 % CREA Apply 1 application topically daily as needed (Cuts and scrapes).  06/23/17   [provider]  hydrochlorothiazide (MICROZIDE) 12.5 MG capsule Take 1 capsule by mouth daily. 12/24/15   [provider]  nitroGLYCERIN (NITROSTAT) 0.4 MG SL tablet Place 0.4 mg under the tongue every 5 (five) minutes as needed.  06/23/17   [provider]  oxyCODONE (ROXICODONE) 15 MG immediate release tablet Take 15 mg by mouth every 4 (four) hours as needed. pain    [provider]    Family History Family History  Problem Relation Age of  Onset  . Heart attack Mother     Social History Social History   Tobacco Use  . Smoking status: Former Smoker    Last attempt to quit: 07/16/1972    Years since quitting: 45.8  . Smokeless tobacco: Never Used  Substance Use Topics  . Alcohol use: No  . Drug use: No     Allergies   Crestor [rosuvastatin] and Paroxetine   Review of Systems Review of Systems  Constitutional: Negative for fever.  Gastrointestinal: Positive for abdominal pain. Negative for nausea and vomiting.  Genitourinary: Positive for difficulty urinating.     Physical  Exam Updated Vital Signs BP (!) 162/75 (BP Location: Right Arm)   Temp 98 F (36.7 C) (Oral)   Resp 19   Ht 6\' 2"  (1.88 m)   Wt 78.5 kg (173 lb)   SpO2 98%   BMI 22.21 kg/m   Physical Exam  Constitutional:  Well-appearing other than looking uncomfortable related to his inability to urinate  HENT:  Normocephalic and atraumatic  Eyes:  No icterus or jaundice  Neck:  No trismus or torticollis  Cardiovascular:  Normal cardiac exam without tachycardia  Pulmonary/Chest:  Lungs clear, no distress  Abdominal:  Abdomen mildly tender around the umbilicus and significantly tender in the suprapubic region with a fullness  Genitourinary:  Genitourinary Comments: No hernias present, normal-appearing scrotum and testicles bilaterally.  Normal-appearing penis, there is an abnormal morphology to the penile shaft, he states that ever since having a catheter placed in the past he has had an abnormal mass, this is chronic, no blood drainage discharge or urine at the urethral meatus  Musculoskeletal:  No edema or deformity of the lower extremities  Neurological:  Speech is clear, movements are coordinated, normal strength.     ED Treatments / Results  Labs (all labs ordered are listed, but only abnormal results are displayed) Labs Reviewed  URINALYSIS, ROUTINE W REFLEX MICROSCOPIC - Abnormal; Notable for the following components:      Result Value   Hgb urine dipstick SMALL (*)    All other components within normal limits  URINE CULTURE    EKG None  Radiology No results found.  Procedures Procedures (including critical care time)  Medications Ordered in ED Medications - No data to display   Initial Impression / Assessment and Plan / ED Course  I have reviewed the triage vital signs and the nursing notes.  Pertinent labs & imaging results that were available during my care of the patient were reviewed by me and considered in my medical decision making (see chart for  details).  Clinical Course as of May 08 1501  Sun May 08, 2018  1500 Urinalysis is negative for infection, culture sent, the patient will be placed on a Foley bag to his leg and will be discharged home to follow-up with a urologist.  The patient is stable appearing with no more abdominal pain which is been totally received with Foley catheter placement   [BM]    Clinical Course User Index [BM] Noemi Chapel, MD   Review of the medical record shows that the patient has had some abnormal kidney function with a stage III chronic kidney disease.  He has been able to pass urine just fine until 3:00 in the morning.  This suggest an acute urinary retention, Foley catheter has been ordered, check urine and urine culture.  He did not have any prodromal urinary symptoms yesterday.  Foley placed, Pt stable Ready for d/c  Final  Clinical Impressions(s) / ED Diagnoses   Final diagnoses:  Urinary retention      Noemi Chapel, MD 05/08/18 1502

## 2018-05-10 LAB — URINE CULTURE: Culture: NO GROWTH

## 2018-05-20 DIAGNOSIS — E538 Deficiency of other specified B group vitamins: Secondary | ICD-10-CM | POA: Diagnosis not present

## 2018-05-20 DIAGNOSIS — N401 Enlarged prostate with lower urinary tract symptoms: Secondary | ICD-10-CM | POA: Diagnosis not present

## 2018-05-20 DIAGNOSIS — N139 Obstructive and reflux uropathy, unspecified: Secondary | ICD-10-CM | POA: Diagnosis not present

## 2018-05-20 DIAGNOSIS — Z6823 Body mass index (BMI) 23.0-23.9, adult: Secondary | ICD-10-CM | POA: Diagnosis not present

## 2018-05-20 DIAGNOSIS — G894 Chronic pain syndrome: Secondary | ICD-10-CM | POA: Diagnosis not present

## 2018-09-05 DIAGNOSIS — L570 Actinic keratosis: Secondary | ICD-10-CM | POA: Diagnosis not present

## 2018-09-05 DIAGNOSIS — D485 Neoplasm of uncertain behavior of skin: Secondary | ICD-10-CM | POA: Diagnosis not present

## 2018-09-05 DIAGNOSIS — L821 Other seborrheic keratosis: Secondary | ICD-10-CM | POA: Diagnosis not present

## 2018-09-28 DIAGNOSIS — Z1389 Encounter for screening for other disorder: Secondary | ICD-10-CM | POA: Diagnosis not present

## 2018-09-28 DIAGNOSIS — E663 Overweight: Secondary | ICD-10-CM | POA: Diagnosis not present

## 2018-09-28 DIAGNOSIS — Z6826 Body mass index (BMI) 26.0-26.9, adult: Secondary | ICD-10-CM | POA: Diagnosis not present

## 2018-09-28 DIAGNOSIS — Z0001 Encounter for general adult medical examination with abnormal findings: Secondary | ICD-10-CM | POA: Diagnosis not present

## 2018-10-18 DIAGNOSIS — Z23 Encounter for immunization: Secondary | ICD-10-CM | POA: Diagnosis not present

## 2018-10-18 DIAGNOSIS — H8113 Benign paroxysmal vertigo, bilateral: Secondary | ICD-10-CM | POA: Diagnosis not present

## 2018-10-18 DIAGNOSIS — Z6822 Body mass index (BMI) 22.0-22.9, adult: Secondary | ICD-10-CM | POA: Diagnosis not present

## 2018-10-18 DIAGNOSIS — G894 Chronic pain syndrome: Secondary | ICD-10-CM | POA: Diagnosis not present

## 2018-10-18 DIAGNOSIS — E538 Deficiency of other specified B group vitamins: Secondary | ICD-10-CM | POA: Diagnosis not present

## 2018-10-18 DIAGNOSIS — R42 Dizziness and giddiness: Secondary | ICD-10-CM | POA: Diagnosis not present

## 2018-11-04 ENCOUNTER — Ambulatory Visit (HOSPITAL_COMMUNITY)
Admission: RE | Admit: 2018-11-04 | Discharge: 2018-11-04 | Disposition: A | Discharge status: Home / Self Care | Payer: Medicare Other | Source: Ambulatory Visit | Attending: Internal Medicine | Admitting: Internal Medicine

## 2018-11-04 ENCOUNTER — Encounter (HOSPITAL_COMMUNITY): Payer: Self-pay

## 2018-11-04 ENCOUNTER — Other Ambulatory Visit (HOSPITAL_COMMUNITY): Payer: Self-pay | Admitting: Internal Medicine

## 2018-11-04 DIAGNOSIS — R109 Unspecified abdominal pain: Secondary | ICD-10-CM | POA: Diagnosis not present

## 2018-11-04 DIAGNOSIS — E785 Hyperlipidemia, unspecified: Secondary | ICD-10-CM | POA: Diagnosis not present

## 2018-11-04 DIAGNOSIS — R143 Flatulence: Secondary | ICD-10-CM | POA: Diagnosis not present

## 2018-11-04 DIAGNOSIS — Z6824 Body mass index (BMI) 24.0-24.9, adult: Secondary | ICD-10-CM | POA: Diagnosis not present

## 2018-11-04 DIAGNOSIS — D649 Anemia, unspecified: Secondary | ICD-10-CM | POA: Diagnosis not present

## 2018-11-04 DIAGNOSIS — M1991 Primary osteoarthritis, unspecified site: Secondary | ICD-10-CM | POA: Diagnosis not present

## 2018-11-04 DIAGNOSIS — Z0000 Encounter for general adult medical examination without abnormal findings: Secondary | ICD-10-CM | POA: Diagnosis not present

## 2018-11-04 DIAGNOSIS — I10 Essential (primary) hypertension: Secondary | ICD-10-CM | POA: Diagnosis not present

## 2018-11-04 DIAGNOSIS — Z1389 Encounter for screening for other disorder: Secondary | ICD-10-CM | POA: Diagnosis not present

## 2018-11-04 DIAGNOSIS — R1013 Epigastric pain: Secondary | ICD-10-CM | POA: Diagnosis not present

## 2018-11-04 DIAGNOSIS — E538 Deficiency of other specified B group vitamins: Secondary | ICD-10-CM | POA: Diagnosis not present

## 2018-11-04 DIAGNOSIS — N183 Chronic kidney disease, stage 3 (moderate): Secondary | ICD-10-CM | POA: Diagnosis not present

## 2018-11-04 DIAGNOSIS — R946 Abnormal results of thyroid function studies: Secondary | ICD-10-CM | POA: Diagnosis not present

## 2018-11-04 DIAGNOSIS — N400 Enlarged prostate without lower urinary tract symptoms: Secondary | ICD-10-CM | POA: Diagnosis not present

## 2018-11-04 DIAGNOSIS — R140 Abdominal distension (gaseous): Secondary | ICD-10-CM | POA: Diagnosis not present

## 2018-11-04 DIAGNOSIS — R1915 Other abnormal bowel sounds: Secondary | ICD-10-CM | POA: Diagnosis not present

## 2018-11-14 ENCOUNTER — Ambulatory Visit (INDEPENDENT_AMBULATORY_CARE_PROVIDER_SITE_OTHER): Payer: Medicare Other | Admitting: Internal Medicine

## 2018-11-14 ENCOUNTER — Encounter (INDEPENDENT_AMBULATORY_CARE_PROVIDER_SITE_OTHER): Payer: Self-pay | Admitting: Internal Medicine

## 2018-11-14 ENCOUNTER — Ambulatory Visit (HOSPITAL_COMMUNITY)
Admission: RE | Admit: 2018-11-14 | Discharge: 2018-11-14 | Disposition: A | Discharge status: Home / Self Care | Payer: Medicare Other | Source: Ambulatory Visit | Attending: Internal Medicine | Admitting: Internal Medicine

## 2018-11-14 VITALS — BP 138/70 | HR 88 | Temp 97.6°F | Ht 73.0 in | Wt 169.7 lb

## 2018-11-14 DIAGNOSIS — N400 Enlarged prostate without lower urinary tract symptoms: Secondary | ICD-10-CM | POA: Insufficient documentation

## 2018-11-14 DIAGNOSIS — R140 Abdominal distension (gaseous): Secondary | ICD-10-CM

## 2018-11-14 DIAGNOSIS — R9389 Abnormal findings on diagnostic imaging of other specified body structures: Secondary | ICD-10-CM

## 2018-11-14 DIAGNOSIS — K560 Paralytic ileus: Secondary | ICD-10-CM

## 2018-11-14 LAB — POCT I-STAT CREATININE: Creatinine, Ser: 1.2 mg/dL (ref 0.61–1.24)

## 2018-11-14 MED ORDER — IOPAMIDOL (ISOVUE-300) INJECTION 61%
100.0000 mL | Freq: Once | INTRAVENOUS | Status: AC | PRN
  Administered 2018-11-14: 100 mL via INTRAVENOUS

## 2018-11-14 MED ORDER — IOPAMIDOL (ISOVUE-300) INJECTION 61%
50.0000 mL | Freq: Once | INTRAVENOUS | Status: AC | PRN
Start: 2018-11-14 — End: 2018-11-14
  Administered 2018-11-14: 30 mL via ORAL

## 2018-11-14 NOTE — Progress Notes (Addendum)
Subjective:    Patient ID: Travis Mosher., male    DOB: 1941-02-18, 77 y.o.   MRN: [id number]  HPI Referred by Dr. Gerarda Fraction for possible bowel obstruction. Saw Dr. Gerarda Fraction 11/04/2018 for abdominal pain  3 months. Also progressive abdominal distention. Also states he has a lot of noise in his abdomen. Rarely has pain. States when he has pain, more like a BM type pain.  Has a BM daily. No melena or BRRB. Stools are loose usually and are normal in size.  He says there is no change in his BMs.   On chronic pain medicine for chronic back pain.  States his normal weight 220. He had his GB removed last year, biliary colic cholelithiasis  Underwent a DG acute abdomen w/chest 11/04/2018 which revealed   IMPRESSION: 1. Increased bowel gas diffusely with gaseous distention of the colon, but no small bowel dilation. There are fluid levels on the erect view. Findings may reflect a distal colonic obstruction or an adynamic ileus. Consider further evaluation with contrast enhanced CT of the abdomen and pelvis. 2. No free air.  3. No acute cardiopulmonary disease. No hx of etoh abuse.  Does not smoke.  Last colonoscopy was in December of 2011 (post prandial N and V, wt loss, intermittent tarry stools. : Redundant colon, Cecum not seen. Single diverticulum in sigmoid colon.  12/18/2010 DG colon with ZSM:[id number]: Significantly redundant colon. 2 questionable tiny sessile polyps within the distal transverse colon, 5 mm diameter or less. Small amount of retained stool, decreasing sensitivity for identification of polyps particularly at the transverse colon.    11/04/2018 H andH  11.2 and 32.7, platelet ct 85 (low). ALP 108, AST 17, ALT 11.     Review of Systems Past Medical History:  Diagnosis Date  . Chronic kidney disease    stage 3  . Complication of anesthesia    difficulty urinating   . Coronary artery disease   . Enlarged prostate   . Hyperlipidemia   . Hypertension     Past  Surgical History:  Procedure Laterality Date  . CARDIAC CATHETERIZATION  2005  . CHOLECYSTECTOMY N/A 07/19/2017   Procedure: LAPAROSCOPIC CHOLECYSTECTOMY;  Surgeon: Aviva Signs, MD;  Location: [facility];  Service: General;  Laterality: N/A;  . CORONARY STENT PLACEMENT  2005  . LAPAROSCOPIC APPENDECTOMY  04/17/2012   Procedure: APPENDECTOMY LAPAROSCOPIC;  Surgeon: Jamesetta So, MD;  Location: [facility];  Service: General;  Laterality: N/A;    Allergies  Allergen Reactions  . Crestor [Rosuvastatin]   . Paroxetine Rash    Current Outpatient Medications on File Prior to Visit  Medication Sig Dispense Refill  . aspirin EC 81 MG tablet Take 81 mg by mouth daily.    . cholecalciferol (VITAMIN D) 1000 units tablet Take 1,000 Units by mouth daily.    . clopidogrel (PLAVIX) 75 MG tablet Take 75 mg by mouth daily.    . Cyanocobalamin (VITAMIN B-12 IJ) Inject 1 mL as directed every 30 (thirty) days.     . Fluocinonide Emulsified Base 0.05 % CREA Apply 1 application topically daily as needed (Cuts and scrapes).     . hydrochlorothiazide (MICROZIDE) 12.5 MG capsule Take 1 capsule by mouth daily.    . nitroGLYCERIN (NITROSTAT) 0.4 MG SL tablet Place 0.4 mg under the tongue every 5 (five) minutes as needed.     Marland Kitchen oxyCODONE (ROXICODONE) 15 MG immediate release tablet Take 15 mg by mouth every 4 (four) hours as needed. He takes  3-4 times a day as needed     No current facility-administered medications on file prior to visit.         Objective:   Physical Exam Blood pressure 138/70, pulse 88, temperature 97.6 F (36.4 C), height 6\' 1"  (1.854 m), weight 169 lb 11.2 oz (77 kg). Alert and oriented. Skin warm and dry. Oral mucosa is moist.   . Sclera anicteric, conjunctivae is pink. Thyroid not enlarged. No cervical lymphadenopathy. Lungs clear. Heart regular rate and rhythm.  Abdomen is soft. Bowel sounds are positive. No hepatomegaly. No abdominal masses felt. No tenderness.  No edema to lower extremities.  Abdomen is distended.           Assessment & Plan:  Abdominal distention. Abnormal Xray of abdomen. Obstruction needs to be ruled out.

## 2018-11-14 NOTE — Patient Instructions (Signed)
CT abdomen with CM.

## 2019-01-05 DIAGNOSIS — R634 Abnormal weight loss: Secondary | ICD-10-CM | POA: Diagnosis not present

## 2019-01-05 DIAGNOSIS — R109 Unspecified abdominal pain: Secondary | ICD-10-CM | POA: Diagnosis not present

## 2019-01-05 DIAGNOSIS — Z1389 Encounter for screening for other disorder: Secondary | ICD-10-CM | POA: Diagnosis not present

## 2019-01-05 DIAGNOSIS — E538 Deficiency of other specified B group vitamins: Secondary | ICD-10-CM | POA: Diagnosis not present

## 2019-01-05 DIAGNOSIS — R1915 Other abnormal bowel sounds: Secondary | ICD-10-CM | POA: Diagnosis not present

## 2019-01-05 DIAGNOSIS — D510 Vitamin B12 deficiency anemia due to intrinsic factor deficiency: Secondary | ICD-10-CM | POA: Diagnosis not present

## 2019-01-05 DIAGNOSIS — Z6821 Body mass index (BMI) 21.0-21.9, adult: Secondary | ICD-10-CM | POA: Diagnosis not present

## 2019-01-30 ENCOUNTER — Telehealth (INDEPENDENT_AMBULATORY_CARE_PROVIDER_SITE_OTHER): Payer: Self-pay | Admitting: *Deleted

## 2019-01-30 ENCOUNTER — Telehealth: Payer: Self-pay | Admitting: Gastroenterology

## 2019-01-30 NOTE — Telephone Encounter (Addendum)
Spoke with patient's wife.  Very pleasant and very concerned about her husband.    I talked to her about the note wanting to switch and calling us now to be seen.  I asked about not being please with his care here.  She said they saw Terri and was given OTC meds to try and when they called back in was told needed to continue a little longer.  --Should have been given an appointment once he called back,   but I cannot change that now.  She said she was panicking and felt he was very sick and needed to be seen today somewhere.  (after reviewing his chart it appears he has only been here 1 time and no follow-up)  ([facility]ri is out due to death in family and Dr. Laural Golden is seeing her patients today along with some of his too so there is no urgent spots today.  We may have one tomorrow once we call reminders.  Dr. Laural Golden would need to review chart since they have decided to leave practice. )    She felt he needed to see someone today and was going to take him to the ER.  I told her I would if I was her or call PCP or go to urgent care if she felt he could not wait.  Again very pleasant and concerned about her husband and understood that ER would determine if he needed to be admitted.  She thanked me and plan was to proceed to ER.

## 2019-01-30 NOTE — Telephone Encounter (Signed)
Hi Dr. Tarri Glenn, we have received a referral from pt's PCP for evaluation of abdominal distention. Pt is established at [facility] but would like to transfer his care over to [place] because he has not been pleased with the care that he received. Records from [facility] is in [place]. Could you please review them and advise if it is ok to schedule pt for a consult with you? Thank you.

## 2019-01-30 NOTE — Telephone Encounter (Signed)
It is okay to schedule with me. However, there is a note in Epic from [facility] that was filed after yours noting that they were trying to get him an office appointment 01/31/19. It appears that he may wish to continue to receive his care there if he can be seen quickly. Thanks.

## 2019-02-01 ENCOUNTER — Encounter: Payer: Self-pay | Admitting: Gastroenterology

## 2019-02-01 NOTE — Telephone Encounter (Signed)
Left message to call back. Ok per Dr. Tarri Glenn to schedule pt for appt but we need to confirm if pt did not see previous gi md per phone note 01/30/19.

## 2019-02-21 ENCOUNTER — Ambulatory Visit: Payer: Medicare Other | Admitting: Gastroenterology

## 2019-02-22 ENCOUNTER — Ambulatory Visit: Payer: Medicare Other | Admitting: Gastroenterology

## 2019-03-29 DIAGNOSIS — Z6821 Body mass index (BMI) 21.0-21.9, adult: Secondary | ICD-10-CM | POA: Diagnosis not present

## 2019-03-29 DIAGNOSIS — G894 Chronic pain syndrome: Secondary | ICD-10-CM | POA: Diagnosis not present

## 2019-03-29 DIAGNOSIS — E538 Deficiency of other specified B group vitamins: Secondary | ICD-10-CM | POA: Diagnosis not present

## 2019-03-29 DIAGNOSIS — D510 Vitamin B12 deficiency anemia due to intrinsic factor deficiency: Secondary | ICD-10-CM | POA: Diagnosis not present

## 2019-05-12 DIAGNOSIS — D510 Vitamin B12 deficiency anemia due to intrinsic factor deficiency: Secondary | ICD-10-CM | POA: Diagnosis not present

## 2019-05-12 DIAGNOSIS — Z1389 Encounter for screening for other disorder: Secondary | ICD-10-CM | POA: Diagnosis not present

## 2019-05-12 DIAGNOSIS — Z6820 Body mass index (BMI) 20.0-20.9, adult: Secondary | ICD-10-CM | POA: Diagnosis not present

## 2019-05-12 DIAGNOSIS — G894 Chronic pain syndrome: Secondary | ICD-10-CM | POA: Diagnosis not present

## 2019-05-12 DIAGNOSIS — I10 Essential (primary) hypertension: Secondary | ICD-10-CM | POA: Diagnosis not present

## 2019-05-12 DIAGNOSIS — K219 Gastro-esophageal reflux disease without esophagitis: Secondary | ICD-10-CM | POA: Diagnosis not present

## 2019-05-12 DIAGNOSIS — E538 Deficiency of other specified B group vitamins: Secondary | ICD-10-CM | POA: Diagnosis not present

## 2019-05-12 DIAGNOSIS — M1991 Primary osteoarthritis, unspecified site: Secondary | ICD-10-CM | POA: Diagnosis not present

## 2019-06-08 DIAGNOSIS — G894 Chronic pain syndrome: Secondary | ICD-10-CM | POA: Diagnosis not present

## 2019-06-08 DIAGNOSIS — Z6821 Body mass index (BMI) 21.0-21.9, adult: Secondary | ICD-10-CM | POA: Diagnosis not present

## 2019-06-08 DIAGNOSIS — D510 Vitamin B12 deficiency anemia due to intrinsic factor deficiency: Secondary | ICD-10-CM | POA: Diagnosis not present

## 2019-06-08 DIAGNOSIS — K219 Gastro-esophageal reflux disease without esophagitis: Secondary | ICD-10-CM | POA: Diagnosis not present

## 2019-06-08 DIAGNOSIS — M2550 Pain in unspecified joint: Secondary | ICD-10-CM | POA: Diagnosis not present

## 2019-06-08 DIAGNOSIS — I10 Essential (primary) hypertension: Secondary | ICD-10-CM | POA: Diagnosis not present

## 2019-06-08 DIAGNOSIS — E538 Deficiency of other specified B group vitamins: Secondary | ICD-10-CM | POA: Diagnosis not present

## 2019-06-28 ENCOUNTER — Emergency Department (HOSPITAL_COMMUNITY): Payer: Medicare Other

## 2019-06-28 ENCOUNTER — Other Ambulatory Visit: Payer: Self-pay

## 2019-06-28 ENCOUNTER — Encounter (HOSPITAL_COMMUNITY): Payer: Self-pay | Admitting: Emergency Medicine

## 2019-06-28 ENCOUNTER — Inpatient Hospital Stay (HOSPITAL_COMMUNITY)
Admission: EM | Admit: 2019-06-28 | Discharge: 2019-06-30 | DRG: 470 | Disposition: A | Discharge status: Home / Self Care | Payer: Medicare Other | Attending: Family Medicine | Admitting: Family Medicine

## 2019-06-28 DIAGNOSIS — I2510 Atherosclerotic heart disease of native coronary artery without angina pectoris: Secondary | ICD-10-CM | POA: Diagnosis present

## 2019-06-28 DIAGNOSIS — I959 Hypotension, unspecified: Secondary | ICD-10-CM | POA: Diagnosis not present

## 2019-06-28 DIAGNOSIS — S72002A Fracture of unspecified part of neck of left femur, initial encounter for closed fracture: Secondary | ICD-10-CM | POA: Diagnosis not present

## 2019-06-28 DIAGNOSIS — Z7902 Long term (current) use of antithrombotics/antiplatelets: Secondary | ICD-10-CM | POA: Diagnosis not present

## 2019-06-28 DIAGNOSIS — Y92019 Unspecified place in single-family (private) house as the place of occurrence of the external cause: Secondary | ICD-10-CM | POA: Diagnosis not present

## 2019-06-28 DIAGNOSIS — S299XXA Unspecified injury of thorax, initial encounter: Secondary | ICD-10-CM | POA: Diagnosis not present

## 2019-06-28 DIAGNOSIS — Z888 Allergy status to other drugs, medicaments and biological substances status: Secondary | ICD-10-CM | POA: Diagnosis not present

## 2019-06-28 DIAGNOSIS — M62838 Other muscle spasm: Secondary | ICD-10-CM | POA: Diagnosis present

## 2019-06-28 DIAGNOSIS — Z87891 Personal history of nicotine dependence: Secondary | ICD-10-CM

## 2019-06-28 DIAGNOSIS — E785 Hyperlipidemia, unspecified: Secondary | ICD-10-CM | POA: Diagnosis present

## 2019-06-28 DIAGNOSIS — I129 Hypertensive chronic kidney disease with stage 1 through stage 4 chronic kidney disease, or unspecified chronic kidney disease: Secondary | ICD-10-CM | POA: Diagnosis present

## 2019-06-28 DIAGNOSIS — K219 Gastro-esophageal reflux disease without esophagitis: Secondary | ICD-10-CM | POA: Diagnosis present

## 2019-06-28 DIAGNOSIS — Z96642 Presence of left artificial hip joint: Secondary | ICD-10-CM | POA: Diagnosis not present

## 2019-06-28 DIAGNOSIS — M25552 Pain in left hip: Secondary | ICD-10-CM | POA: Diagnosis not present

## 2019-06-28 DIAGNOSIS — Z8249 Family history of ischemic heart disease and other diseases of the circulatory system: Secondary | ICD-10-CM | POA: Diagnosis not present

## 2019-06-28 DIAGNOSIS — W1839XA Other fall on same level, initial encounter: Secondary | ICD-10-CM | POA: Diagnosis present

## 2019-06-28 DIAGNOSIS — Z03818 Encounter for observation for suspected exposure to other biological agents ruled out: Secondary | ICD-10-CM | POA: Diagnosis not present

## 2019-06-28 DIAGNOSIS — S728X2A Other fracture of left femur, initial encounter for closed fracture: Secondary | ICD-10-CM | POA: Diagnosis not present

## 2019-06-28 DIAGNOSIS — Z955 Presence of coronary angioplasty implant and graft: Secondary | ICD-10-CM | POA: Diagnosis not present

## 2019-06-28 DIAGNOSIS — D509 Iron deficiency anemia, unspecified: Secondary | ICD-10-CM | POA: Diagnosis present

## 2019-06-28 DIAGNOSIS — Z8781 Personal history of (healed) traumatic fracture: Secondary | ICD-10-CM

## 2019-06-28 DIAGNOSIS — Z1159 Encounter for screening for other viral diseases: Secondary | ICD-10-CM

## 2019-06-28 DIAGNOSIS — Z9889 Other specified postprocedural states: Secondary | ICD-10-CM

## 2019-06-28 DIAGNOSIS — Z79899 Other long term (current) drug therapy: Secondary | ICD-10-CM | POA: Diagnosis not present

## 2019-06-28 DIAGNOSIS — S72032A Displaced midcervical fracture of left femur, initial encounter for closed fracture: Principal | ICD-10-CM | POA: Diagnosis present

## 2019-06-28 DIAGNOSIS — Z7982 Long term (current) use of aspirin: Secondary | ICD-10-CM | POA: Diagnosis not present

## 2019-06-28 DIAGNOSIS — D638 Anemia in other chronic diseases classified elsewhere: Secondary | ICD-10-CM | POA: Diagnosis present

## 2019-06-28 DIAGNOSIS — N400 Enlarged prostate without lower urinary tract symptoms: Secondary | ICD-10-CM | POA: Diagnosis present

## 2019-06-28 DIAGNOSIS — N183 Chronic kidney disease, stage 3 (moderate): Secondary | ICD-10-CM | POA: Diagnosis present

## 2019-06-28 DIAGNOSIS — R52 Pain, unspecified: Secondary | ICD-10-CM | POA: Diagnosis not present

## 2019-06-28 DIAGNOSIS — W19XXXA Unspecified fall, initial encounter: Secondary | ICD-10-CM

## 2019-06-28 DIAGNOSIS — D696 Thrombocytopenia, unspecified: Secondary | ICD-10-CM | POA: Diagnosis present

## 2019-06-28 DIAGNOSIS — Z471 Aftercare following joint replacement surgery: Secondary | ICD-10-CM | POA: Diagnosis not present

## 2019-06-28 DIAGNOSIS — D649 Anemia, unspecified: Secondary | ICD-10-CM | POA: Diagnosis present

## 2019-06-28 DIAGNOSIS — M1612 Unilateral primary osteoarthritis, left hip: Secondary | ICD-10-CM | POA: Diagnosis not present

## 2019-06-28 DIAGNOSIS — I10 Essential (primary) hypertension: Secondary | ICD-10-CM | POA: Diagnosis present

## 2019-06-28 LAB — COMPREHENSIVE METABOLIC PANEL
ALT: 26 U/L (ref 0–44)
AST: 22 U/L (ref 15–41)
Albumin: 2.8 g/dL — ABNORMAL LOW (ref 3.5–5.0)
Alkaline Phosphatase: 123 U/L (ref 38–126)
Anion gap: 11 (ref 5–15)
BUN: 21 mg/dL (ref 8–23)
CO2: 25 mmol/L (ref 22–32)
Calcium: 8.1 mg/dL — ABNORMAL LOW (ref 8.9–10.3)
Chloride: 101 mmol/L (ref 98–111)
Creatinine, Ser: 1.41 mg/dL — ABNORMAL HIGH (ref 0.61–1.24)
GFR calc Af Amer: 55 mL/min — ABNORMAL LOW (ref >60)
GFR calc non Af Amer: 47 mL/min — ABNORMAL LOW (ref >60)
Glucose, Bld: 101 mg/dL — ABNORMAL HIGH (ref 70–99)
Potassium: 3.8 mmol/L (ref 3.5–5.1)
Sodium: 137 mmol/L (ref 135–145)
Total Bilirubin: 0.5 mg/dL (ref 0.3–1.2)
Total Protein: 5.8 g/dL — ABNORMAL LOW (ref 6.5–8.1)

## 2019-06-28 LAB — CBC WITH DIFFERENTIAL/PLATELET
Abs Immature Granulocytes: 0.04 10*3/uL (ref 0.00–0.07)
Basophils Absolute: 0.1 10*3/uL (ref 0.0–0.1)
Basophils Relative: 1 %
Eosinophils Absolute: 0.2 10*3/uL (ref 0.0–0.5)
Eosinophils Relative: 3 %
HCT: 33 % — ABNORMAL LOW (ref 39.0–52.0)
Hemoglobin: 10.5 g/dL — ABNORMAL LOW (ref 13.0–17.0)
Immature Granulocytes: 1 %
Lymphocytes Relative: 9 %
Lymphs Abs: 0.7 10*3/uL (ref 0.7–4.0)
MCH: 30.9 pg (ref 26.0–34.0)
MCHC: 31.8 g/dL (ref 30.0–36.0)
MCV: 97.1 fL (ref 80.0–100.0)
Monocytes Absolute: 0.5 10*3/uL (ref 0.1–1.0)
Monocytes Relative: 7 %
Neutro Abs: 6.2 10*3/uL (ref 1.7–7.7)
Neutrophils Relative %: 79 %
Platelets: 103 10*3/uL — ABNORMAL LOW (ref 150–400)
RBC: 3.4 MIL/uL — ABNORMAL LOW (ref 4.22–5.81)
RDW: 15.6 % — ABNORMAL HIGH (ref 11.5–15.5)
WBC: 7.8 10*3/uL (ref 4.0–10.5)
nRBC: 0 % (ref 0.0–0.2)

## 2019-06-28 LAB — SARS CORONAVIRUS 2 BY RT PCR (HOSPITAL ORDER, PERFORMED IN ~~LOC~~ HOSPITAL LAB): SARS Coronavirus 2: NEGATIVE

## 2019-06-28 MED ORDER — HYDROMORPHONE HCL 1 MG/ML IJ SOLN
1.0000 mg | Freq: Once | INTRAMUSCULAR | Status: AC
  Administered 2019-06-28: 1 mg via INTRAVENOUS
  Filled 2019-06-28: qty 1

## 2019-06-28 MED ORDER — NITROGLYCERIN 0.4 MG SL SUBL: 0.4000 mg | SUBLINGUAL_TABLET | SUBLINGUAL | Status: DC | PRN

## 2019-06-28 MED ORDER — HYDROMORPHONE HCL 1 MG/ML IJ SOLN
1.0000 mg | INTRAMUSCULAR | Status: DC | PRN
  Administered 2019-06-29: 1 mg via INTRAVENOUS
  Filled 2019-06-28 (×2): qty 1

## 2019-06-28 MED ORDER — KETOROLAC TROMETHAMINE 15 MG/ML IJ SOLN
15.0000 mg | Freq: Once | INTRAMUSCULAR | Status: AC
  Administered 2019-06-28: 15 mg via INTRAVENOUS
  Filled 2019-06-28: qty 1

## 2019-06-28 MED ORDER — POTASSIUM CHLORIDE IN NACL 20-0.9 MEQ/L-% IV SOLN
INTRAVENOUS | Status: AC
  Administered 2019-06-28: 22:00:00 via INTRAVENOUS
  Filled 2019-06-28 (×2): qty 1000

## 2019-06-28 MED ORDER — ONDANSETRON HCL 4 MG/2ML IJ SOLN
4.0000 mg | Freq: Once | INTRAMUSCULAR | Status: AC
  Administered 2019-06-28: 4 mg via INTRAVENOUS
  Filled 2019-06-28: qty 2

## 2019-06-28 MED ORDER — METHOCARBAMOL 1000 MG/10ML IJ SOLN
INTRAMUSCULAR | Status: AC
  Filled 2019-06-28: qty 10

## 2019-06-28 MED ORDER — OXYCODONE HCL 5 MG PO TABS
15.0000 mg | ORAL_TABLET | ORAL | Status: DC | PRN
  Administered 2019-06-29 (×2): 15 mg via ORAL
  Filled 2019-06-28 (×2): qty 3

## 2019-06-28 MED ORDER — METHOCARBAMOL 1000 MG/10ML IJ SOLN
500.0000 mg | Freq: Once | INTRAVENOUS | Status: AC
  Administered 2019-06-28: 500 mg via INTRAVENOUS
  Filled 2019-06-28: qty 5

## 2019-06-28 MED ORDER — ENSURE PRE-SURGERY PO LIQD
296.0000 mL | Freq: Once | ORAL | Status: AC
  Administered 2019-06-28: 296 mL via ORAL
  Filled 2019-06-28: qty 296

## 2019-06-28 MED ORDER — METOPROLOL TARTRATE 5 MG/5ML IV SOLN: 2.5000 mg | Freq: Four times a day (QID) | INTRAVENOUS | Status: DC | PRN

## 2019-06-28 MED ORDER — HYDROMORPHONE HCL 2 MG/ML IJ SOLN
2.0000 mg | Freq: Once | INTRAMUSCULAR | Status: AC
  Administered 2019-06-28: 2 mg via INTRAVENOUS
  Filled 2019-06-28: qty 1

## 2019-06-28 MED ORDER — POVIDONE-IODINE 10 % EX SWAB
2.0000 "application " | Freq: Once | CUTANEOUS | Status: AC
  Administered 2019-06-29: 2 via TOPICAL

## 2019-06-28 MED ORDER — CHLORHEXIDINE GLUCONATE 4 % EX LIQD
60.0000 mL | Freq: Once | CUTANEOUS | Status: AC
  Administered 2019-06-29: 4 via TOPICAL
  Filled 2019-06-28: qty 60

## 2019-06-28 MED ORDER — MORPHINE SULFATE (PF) 4 MG/ML IV SOLN
6.0000 mg | Freq: Once | INTRAVENOUS | Status: AC
  Administered 2019-06-28: 6 mg via INTRAVENOUS
  Filled 2019-06-28: qty 2

## 2019-06-28 MED ORDER — MUPIROCIN 2 % EX OINT: 1.0000 "application " | TOPICAL_OINTMENT | Freq: Two times a day (BID) | CUTANEOUS | Status: DC

## 2019-06-28 MED ORDER — TRANEXAMIC ACID-NACL 1000-0.7 MG/100ML-% IV SOLN
1000.0000 mg | INTRAVENOUS | Status: AC
  Administered 2019-06-29: 1000 mg via INTRAVENOUS
  Filled 2019-06-28: qty 100

## 2019-06-28 MED ORDER — CEFAZOLIN SODIUM-DEXTROSE 2-4 GM/100ML-% IV SOLN
2.0000 g | INTRAVENOUS | Status: AC
  Administered 2019-06-29: 2 g via INTRAVENOUS
  Filled 2019-06-28: qty 100

## 2019-06-28 NOTE — Plan of Care (Signed)
Plan of care discussed.   

## 2019-06-28 NOTE — Progress Notes (Signed)
Called from [facility] regarding left hip fracture.   Plan to transfer to [facility] and for surgery tomorrow after covid code is back, medical optimization, and npo.    Either by myself, or Dr. Alvan Dame, or one of my partners.   Full consult to follow.  Johnny Bridge, MD

## 2019-06-28 NOTE — ED Provider Notes (Signed)
[facility] EMERGENCY DEPARTMENT Provider Note   CSN: [id number] Arrival date & time: 06/28/19  1541     History   Chief Complaint Chief Complaint  Patient presents with  . Fall    HPI Travis Beg. is a 78 y.o. male.     Patient fell and hurt his left hip.  Patient is unable ambulate  The history is provided by the patient. No language interpreter was used.  Fall The current episode started 12 to 24 hours ago. The problem occurs constantly. The problem has not changed since onset.Pertinent negatives include no chest pain, no abdominal pain and no headaches. Nothing aggravates the symptoms. Nothing relieves the symptoms. He has tried nothing for the symptoms.    Past Medical History:  Diagnosis Date  . Chronic kidney disease    stage 3  . Complication of anesthesia    difficulty urinating   . Coronary artery disease   . Enlarged prostate   . Hyperlipidemia   . Hypertension     Patient Active Problem List   Diagnosis Date Noted  . Calculus of gallbladder without cholecystitis without obstruction   . Coronary artery disease 01/02/2015  . GERD 06/23/2010  . WEIGHT LOSS, ABNORMAL 06/23/2010  . NAUSEA, CHRONIC 06/23/2010  . VERTEBRAL FRACTURE, THORACIC SPINE 07/16/2008  . H N P-LUMBAR 06/25/2008  . LOW BACK PAIN 06/25/2008  . SCIATICA 06/25/2008  . LUMBAR SPASM 06/25/2008    Past Surgical History:  Procedure Laterality Date  . CARDIAC CATHETERIZATION  2005  . CHOLECYSTECTOMY N/A 07/19/2017   Procedure: LAPAROSCOPIC CHOLECYSTECTOMY;  Surgeon: Aviva Signs, MD;  Location: [facility];  Service: General;  Laterality: N/A;  . CORONARY STENT PLACEMENT  2005  . LAPAROSCOPIC APPENDECTOMY  04/17/2012   Procedure: APPENDECTOMY LAPAROSCOPIC;  Surgeon: Jamesetta So, MD;  Location: [facility];  Service: General;  Laterality: N/A;        Home Medications    Prior to Admission medications   Medication Sig Start Date End Date Taking? Authorizing Provider  aspirin EC 81 MG  tablet Take 81 mg by mouth daily.    [provider]  cholecalciferol (VITAMIN D) 1000 units tablet Take 1,000 Units by mouth daily.    [provider]  clopidogrel (PLAVIX) 75 MG tablet Take 75 mg by mouth daily.    [provider]  Cyanocobalamin (VITAMIN B-12 IJ) Inject 1 mL as directed every 30 (thirty) days.     [provider]  Fluocinonide Emulsified Base 0.05 % CREA Apply 1 application topically daily as needed (Cuts and scrapes).  06/23/17   [provider]  hydrochlorothiazide (MICROZIDE) 12.5 MG capsule Take 1 capsule by mouth daily. 12/24/15   [provider]  nitroGLYCERIN (NITROSTAT) 0.4 MG SL tablet Place 0.4 mg under the tongue every 5 (five) minutes as needed.  06/23/17   [provider]  oxyCODONE (ROXICODONE) 15 MG immediate release tablet Take 15 mg by mouth every 4 (four) hours as needed. He takes 3-4 times a day as needed    [provider]    Family History Family History  Problem Relation Age of Onset  . Heart attack Mother     Social History Social History   Tobacco Use  . Smoking status: Former Smoker    Quit date: 07/16/1972    Years since quitting: 46.9  . Smokeless tobacco: Never Used  Substance Use Topics  . Alcohol use: No  . Drug use: No     Allergies  Crestor [rosuvastatin] and Paroxetine   Review of Systems Review of Systems  Constitutional: Negative for appetite change and fatigue.  HENT: Negative for congestion, ear discharge and sinus pressure.   Eyes: Negative for discharge.  Respiratory: Negative for cough.   Cardiovascular: Negative for chest pain.  Gastrointestinal: Negative for abdominal pain and diarrhea.  Genitourinary: Negative for frequency and hematuria.  Musculoskeletal: Negative for back pain.       Left hip pain  Skin: Negative for rash.  Neurological: Negative for seizures and headaches.  Psychiatric/Behavioral: Negative for hallucinations.      Physical Exam Updated Vital Signs BP 124/75   Pulse 84   Resp 12   Ht 6\' 2"  (1.88 m)   Wt 70.8 kg   SpO2 97%   BMI 20.03 kg/m   Physical Exam Vitals signs and nursing note reviewed.  Constitutional:      Appearance: He is well-developed.  HENT:     Head: Normocephalic.     Nose: Nose normal.  Eyes:     General: No scleral icterus.    Conjunctiva/sclera: Conjunctivae normal.  Neck:     Musculoskeletal: Neck supple.     Thyroid: No thyromegaly.  Cardiovascular:     Rate and Rhythm: Normal rate and regular rhythm.     Heart sounds: No murmur. No friction rub. No gallop.   Pulmonary:     Breath sounds: No stridor. No wheezing or rales.  Chest:     Chest wall: No tenderness.  Abdominal:     General: There is no distension.     Tenderness: There is no abdominal tenderness. There is no rebound.  Musculoskeletal:     Comments: Tenderness to form left hip  Lymphadenopathy:     Cervical: No cervical adenopathy.  Skin:    Findings: No erythema or rash.  Neurological:     Mental Status: He is oriented to person, place, and time.     Motor: No abnormal muscle tone.     Coordination: Coordination normal.  Psychiatric:        Behavior: Behavior normal.      ED Treatments / Results  Labs (all labs ordered are listed, but only abnormal results are displayed) Labs Reviewed  CBC WITH DIFFERENTIAL/PLATELET - Abnormal; Notable for the following components:      Result Value   RBC 3.40 (*)    Hemoglobin 10.5 (*)    HCT 33.0 (*)    RDW 15.6 (*)    Platelets 103 (*)    All other components within normal limits  COMPREHENSIVE METABOLIC PANEL - Abnormal; Notable for the following components:   Glucose, Bld 101 (*)    Creatinine, Ser 1.41 (*)    Calcium 8.1 (*)    Total Protein 5.8 (*)    Albumin 2.8 (*)    GFR calc non Af Amer 47 (*)    GFR calc Af Amer 55 (*)    All other components within normal limits  SARS CORONAVIRUS 2 (HOSPITAL ORDER, [place] [facility])    EKG None  Radiology Dg Chest Port 1 View  Result Date: 06/28/2019 CLINICAL DATA:  Fall, left hip fracture today.  Former smoker. EXAM: PORTABLE CHEST 1 VIEW COMPARISON:  11/04/2018 FINDINGS: Atherosclerotic calcification of the aortic arch. Indistinctness of the pulmonary vasculature with mild cephalization of blood flow. Old healed left rib fractures. I suspect an old healed left scapular fracture. Heart size within normal limits. Lower cervical plate and screw fixator. IMPRESSION: 1.  Mild indistinctness of the pulmonary vasculature. Given the lack of cardiomegaly I doubt that this is from significant pulmonary venous hypertension, and the cephalization of blood flow may be from the supine nature of this radiograph. Occasionally aggressive hydration can result in indistinct pulmonary vasculature. 2. Old healed left rib and scapular fractures. 3.  Aortic Atherosclerosis (ICD10-I70.0). Electronically Signed   By: Van Clines M.D.   On: 06/28/2019 18:32   Dg Hip Unilat W Or Wo Pelvis 2-3 Views Left  Result Date: 06/28/2019 CLINICAL DATA:  Pain EXAM: DG HIP (WITH OR WITHOUT PELVIS) 2-3V LEFT COMPARISON:  None. FINDINGS: There is an acute displaced transcervical fracture of the left femur. There is no dislocation. There are mild-to-moderate degenerative changes of both hips. IMPRESSION: Acute transcervical fracture of the proximal left femur. Electronically Signed   By: Constance Holster M.D.   On: 06/28/2019 17:55    Procedures Procedures (including critical care time)  Medications Ordered in ED Medications  HYDROmorphone (DILAUDID) injection 1 mg (1 mg Intravenous Given 06/28/19 1627)  ondansetron (ZOFRAN) injection 4 mg (4 mg Intravenous Given 06/28/19 1627)  HYDROmorphone (DILAUDID) injection 2 mg (2 mg Intravenous Given 06/28/19 1717)  morphine 4 MG/ML injection 6 mg (6 mg Intravenous Given 06/28/19 1817)     Initial Impression / Assessment and Plan / ED Course  I have  reviewed the triage vital signs and the nursing notes.  Pertinent labs & imaging results that were available during my care of the patient were reviewed by me and considered in my medical decision making (see chart for details).        Patient with a fractured left hip.  He will be admitted to [facility] long hospital by medicine and orthopedics Dr. Isaiah Blakes will do surgery tomorrow Final Clinical Impressions(s) / ED Diagnoses   Final diagnoses:  S/p left hip fracture    ED Discharge Orders    None       Milton Ferguson, MD 06/28/19 1846

## 2019-06-28 NOTE — ED Notes (Signed)
ED TO INPATIENT HANDOFF REPORT  ED Nurse Name and Phone #: Kelvon Giannini [phone]  S Name/Age/Gender Travis Woodward. 78 y.o. male Room/Bed: APA06/APA06  Code Status   Code Status: Full Code  Home/SNF/Other Home Patient oriented to: self, place, time and situation Is this baseline? Yes   Triage Complete: Triage complete  Chief Complaint Hip Pain  Triage Note Pain to LT hip after falling.  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  States he thinks he heard a bone crack. Pt took 2 15mg  oxycodone x 40 min ago.  Pain has decreased.     Allergies Allergies  Allergen Reactions  . Crestor [Rosuvastatin]   . Paroxetine Rash    Level of Care/Admitting Diagnosis ED Disposition    ED Disposition Condition [facility]: [place] [[id number]]  Level of Care: Telemetry [5]  Admit to tele based on following criteria: Monitor for Ischemic changes  Covid Evaluation: Screening Protocol (No Symptoms)  Diagnosis: Closed transcervical fracture of left femur [facility]) [[id number]]  Admitting Physician: Reubin Milan [[id number]]  Attending Physician: Reubin Milan [[id number]]  Estimated length of stay: past midnight tomorrow  Certification:: I certify this patient will need inpatient services for at least 2 midnights  PT Class (Do Not Modify): Inpatient [101]  PT Acc Code (Do Not Modify): Private [1]       B Medical/Surgery History Past Medical History:  Diagnosis Date  . Chronic kidney disease    stage 3  . Complication of anesthesia    difficulty urinating   . Coronary artery disease   . Enlarged prostate   . Hyperlipidemia   . Hypertension    Past Surgical History:  Procedure Laterality Date  . CARDIAC CATHETERIZATION  2005  . CHOLECYSTECTOMY N/A 07/19/2017   Procedure: LAPAROSCOPIC CHOLECYSTECTOMY;  Surgeon: Aviva Signs, MD;  Location: [facility];  Service: General;  Laterality: N/A;  . CORONARY  STENT PLACEMENT  2005  . LAPAROSCOPIC APPENDECTOMY  04/17/2012   Procedure: APPENDECTOMY LAPAROSCOPIC;  Surgeon: Jamesetta So, MD;  Location: [facility];  Service: General;  Laterality: N/A;     A IV Location/Drains/Wounds Patient Lines/Drains/Airways Status   Active Line/Drains/Airways    Name:   Placement date:   Placement time:   Site:   Days:   Peripheral IV 06/28/19 Right Antecubital   06/28/19    1628    Antecubital   less than 1          Intake/Output Last 24 hours No intake or output data in the 24 hours ending 06/28/19 2156  Labs/Imaging Results for orders placed or performed during the hospital encounter of 06/28/19 (from the past 48 hour(s))  CBC with Differential/Platelet     Status: Abnormal   Collection Time: 06/28/19  4:31 PM  Result Value Ref Range   WBC 7.8 4.0 - 10.5 K/uL   RBC 3.40 (L) 4.22 - 5.81 MIL/uL   Hemoglobin 10.5 (L) 13.0 - 17.0 g/dL   HCT 33.0 (L) 39.0 - 52.0 %   MCV 97.1 80.0 - 100.0 fL   MCH 30.9 26.0 - 34.0 pg   MCHC 31.8 30.0 - 36.0 g/dL   RDW 15.6 (H) 11.5 - 15.5 %   Platelets 103 (L) 150 - 400 K/uL    Comment: PLATELET COUNT CONFIRMED BY SMEAR SPECIMEN CHECKED FOR CLOTS Immature Platelet Fraction may be clinically indicated, consider ordering this additional test [id number]  nRBC 0.0 0.0 - 0.2 %   Neutrophils Relative % 79 %   Neutro Abs 6.2 1.7 - 7.7 K/uL   Lymphocytes Relative 9 %   Lymphs Abs 0.7 0.7 - 4.0 K/uL   Monocytes Relative 7 %   Monocytes Absolute 0.5 0.1 - 1.0 K/uL   Eosinophils Relative 3 %   Eosinophils Absolute 0.2 0.0 - 0.5 K/uL   Basophils Relative 1 %   Basophils Absolute 0.1 0.0 - 0.1 K/uL   Immature Granulocytes 1 %   Abs Immature Granulocytes 0.04 0.00 - 0.07 K/uL    Comment: Performed at [facility], [address] [place], [place] [address]  Comprehensive metabolic panel     Status: Abnormal   Collection Time: 06/28/19  4:31 PM  Result Value Ref Range   Sodium 137 135 - 145 mmol/L   Potassium 3.8 3.5 -  5.1 mmol/L   Chloride 101 98 - 111 mmol/L   CO2 25 22 - 32 mmol/L   Glucose, Bld 101 (H) 70 - 99 mg/dL   BUN 21 8 - 23 mg/dL   Creatinine, Ser 1.41 (H) 0.61 - 1.24 mg/dL   Calcium 8.1 (L) 8.9 - 10.3 mg/dL   Total Protein 5.8 (L) 6.5 - 8.1 g/dL   Albumin 2.8 (L) 3.5 - 5.0 g/dL   AST 22 15 - 41 U/L   ALT 26 0 - 44 U/L   Alkaline Phosphatase 123 38 - 126 U/L   Total Bilirubin 0.5 0.3 - 1.2 mg/dL   GFR calc non Af Amer 47 (L) >60 mL/min   GFR calc Af Amer 55 (L) >60 mL/min   Anion gap 11 5 - 15    Comment: Performed at [facility], [address] [place], [place] [address]  SARS Coronavirus 2 (CEPHEID - Performed in [facility] hospital lab), Hosp Order     Status: None   Collection Time: 06/28/19  5:16 PM   Specimen: Nasopharyngeal Swab  Result Value Ref Range   SARS Coronavirus 2 NEGATIVE NEGATIVE    Comment: (NOTE) If result is NEGATIVE SARS-CoV-2 target nucleic acids are NOT DETECTED. The SARS-CoV-2 RNA is generally detectable in upper and lower  respiratory specimens during the acute phase of infection. The lowest  concentration of SARS-CoV-2 viral copies this assay can detect is 250  copies / mL. A negative result does not preclude SARS-CoV-2 infection  and should not be used as the sole basis for treatment or other  patient management decisions.  A negative result may occur with  improper specimen collection / handling, submission of specimen other  than nasopharyngeal swab, presence of viral mutation(s) within the  areas targeted by this assay, and inadequate number of viral copies  (<250 copies / mL). A negative result must be combined with clinical  observations, patient history, and epidemiological information. If result is POSITIVE SARS-CoV-2 target nucleic acids are DETECTED. The SARS-CoV-2 RNA is generally detectable in upper and lower  respiratory specimens dur ing the acute phase of infection.  Positive  results are indicative of active infection with SARS-CoV-2.   Clinical  correlation with patient history and other diagnostic information is  necessary to determine patient infection status.  Positive results do  not rule out bacterial infection or co-infection with other viruses. If result is PRESUMPTIVE POSTIVE SARS-CoV-2 nucleic acids MAY BE PRESENT.   A presumptive positive result was obtained on the submitted specimen  and confirmed on repeat testing.  While 2019 novel coronavirus  (SARS-CoV-2) nucleic acids  may be present in the submitted sample  additional confirmatory testing may be necessary for epidemiological  and / or clinical management purposes  to differentiate between  SARS-CoV-2 and other Sarbecovirus currently known to infect humans.  If clinically indicated additional testing with an alternate test  methodology [id number]) is advised. The SARS-CoV-2 RNA is generally  detectable in upper and lower respiratory sp ecimens during the acute  phase of infection. The expected result is Negative. Fact Sheet for Patients:  StrictlyIdeas.no Fact Sheet for Healthcare Providers: BankingDealers.co.za This test is not yet approved or cleared by the [place] FDA and has been authorized for detection and/or diagnosis of SARS-CoV-2 by FDA under an Emergency Use Authorization (EUA).  This EUA will remain in effect (meaning this test can be used) for the duration of the COVID-19 declaration under Section 564(b)(1) of the Act, 21 U.S.C. section 360bbb-3(b)(1), unless the authorization is terminated or revoked sooner. Performed at [facility] [address] [place], [place] [address]    Dg Chest Port 1 View  Result Date: 06/28/2019 CLINICAL DATA:  Fall, left hip fracture today.  Former smoker. EXAM: PORTABLE CHEST 1 VIEW COMPARISON:  11/04/2018 FINDINGS: Atherosclerotic calcification of the aortic arch. Indistinctness of the pulmonary vasculature with mild cephalization of blood flow. Old healed  left rib fractures. I suspect an old healed left scapular fracture. Heart size within normal limits. Lower cervical plate and screw fixator. IMPRESSION: 1. Mild indistinctness of the pulmonary vasculature. Given the lack of cardiomegaly I doubt that this is from significant pulmonary venous hypertension, and the cephalization of blood flow may be from the supine nature of this radiograph. Occasionally aggressive hydration can result in indistinct pulmonary vasculature. 2. Old healed left rib and scapular fractures. 3.  Aortic Atherosclerosis (ICD10-I70.0). Electronically Signed   By: Van Clines M.D.   On: 06/28/2019 18:32   Dg Hip Unilat W Or Wo Pelvis 2-3 Views Left  Result Date: 06/28/2019 CLINICAL DATA:  Pain EXAM: DG HIP (WITH OR WITHOUT PELVIS) 2-3V LEFT COMPARISON:  None. FINDINGS: There is an acute displaced transcervical fracture of the left femur. There is no dislocation. There are mild-to-moderate degenerative changes of both hips. IMPRESSION: Acute transcervical fracture of the proximal left femur. Electronically Signed   By: Constance Holster M.D.   On: 06/28/2019 17:55    Pending Labs Unresulted Labs (From admission, onward)   None      Vitals/Pain Today's Vitals   06/28/19 2022 06/28/19 2030 06/28/19 2033 06/28/19 2045  BP:  121/74    Pulse:   (!) 105   Resp:      SpO2:   (!) 89%   Weight:      Height:      PainSc: 10-Worst pain ever   5     Isolation Precautions No active isolations  Medications Medications  HYDROmorphone (DILAUDID) injection 1 mg (has no administration in time range)  methocarbamol (ROBAXIN) 500 mg in dextrose 5 % 50 mL IVPB (500 mg Intravenous New Bag/Given 06/28/19 2136)  0.9 % NaCl with KCl 20 mEq/ L  infusion ( Intravenous New Bag/Given 06/28/19 2137)  HYDROmorphone (DILAUDID) injection 1 mg (1 mg Intravenous Given 06/28/19 1627)  ondansetron (ZOFRAN) injection 4 mg (4 mg Intravenous Given 06/28/19 1627)  HYDROmorphone (DILAUDID) injection 2 mg  (2 mg Intravenous Given 06/28/19 1717)  morphine 4 MG/ML injection 6 mg (6 mg Intravenous Given 06/28/19 1817)  ketorolac (TORADOL) 15 MG/ML injection 15 mg (15 mg Intravenous Given 06/28/19 2018)  HYDROmorphone (  DILAUDID) injection 1 mg (1 mg Intravenous Given 06/28/19 2021)  methocarbamol (ROBAXIN) 1000 MG/10ML injection (has no administration in time range)    Mobility walks Moderate fall risk   Focused Assessments    R Recommendations: See Admitting Provider Note  Report given to:   Additional Notes:

## 2019-06-28 NOTE — H&P (Signed)
History and Physical    Travis Woodward. ZHY:[id number] DOB: 1941-08-20 DOA: 06/28/2019  PCP: Redmond School, MD   Patient coming from: Home.  I have personally briefly reviewed patient's old medical records in [place]  Chief Complaint: Fall and left hip pain.  HPI: Travis Valent. is a 78 y.o. male with medical history significant of stage III chronic kidney disease, coronary artery disease, hyperlipidemia, hypertension, BPH, history of postop acute urinary retention requiring Foley catheter who is brought to the emergency department via EMS after falling at home.  The patient describes he has chronic left lower extremity weakness secondary to multiple back surgeries/sciatica and felt that he is LLE gave out.  He fell hitting his left hip area and states that he heard a crack, feeling immediate pain and having inability to stand up on his own.  He denies lightheadedness, chest pain, palpitations, nausea or emesis or any other symptoms prior to his fall.  He denies headache, sore throat, rhinorrhea, dyspnea, wheezing or hemoptysis.  No abdominal pain, diarrhea, constipation, melena or hematochezia.  No dysuria, frequency or hematuria.  Denies polyuria, polydipsia, polyphagia or blurred vision.  ED Course: Initial vital signs pulse 84, respiration 12, blood pressure 124/75 mmHg and O2 sat 97% on room air.  The patient received hydromorphone 2 mg IVP x1, then morphine sulfate 6 mg IVP x1 dose, then another milligram of hydromorphone IVP.  I also added Toradol 15 mg IVP x1 and Robaxin 500 mg IVPB x1 after the patient complaining of severe muscle spasms.  His white count was 7.8, hemoglobin 10.5 g/dL and platelets 103.  CMP shows normal electrolytes when calcium is corrected to albumin.  Glucose 101, creatinine 1.41 and calcium 8.1 mg/dL.  Total protein was 5.8 and albumin 2.8 g/dL.  The rest of the hepatic functions are within normal limits.  Imaging: left hip shows acute transcervical  fracture of the proximal left femur.  Chest radiograph shows mild indistinctness of the pulmonary vasculature, old left healed rib /left scapular fractures.  Please see images and full radiology report for further detail.  Review of Systems: As per HPI otherwise 10 point review of systems negative.   Past Medical History:  Diagnosis Date  . Chronic kidney disease    stage 3  . Complication of anesthesia    difficulty urinating   . Coronary artery disease   . Enlarged prostate   . Hyperlipidemia   . Hypertension     Past Surgical History:  Procedure Laterality Date  . CARDIAC CATHETERIZATION  2005  . CHOLECYSTECTOMY N/A 07/19/2017   Procedure: LAPAROSCOPIC CHOLECYSTECTOMY;  Surgeon: Aviva Signs, MD;  Location: [facility];  Service: General;  Laterality: N/A;  . CORONARY STENT PLACEMENT  2005  . LAPAROSCOPIC APPENDECTOMY  04/17/2012   Procedure: APPENDECTOMY LAPAROSCOPIC;  Surgeon: Jamesetta So, MD;  Location: [facility];  Service: General;  Laterality: N/A;     reports that he quit smoking about 46 years ago. He has never used smokeless tobacco. He reports that he does not drink alcohol or use drugs.  Allergies  Allergen Reactions  . Crestor [Rosuvastatin]   . Paroxetine Rash    Family History  Problem Relation Age of Onset  . Heart attack Mother    Prior to Admission medications   Medication Sig Start Date End Date Taking? Authorizing Provider  aspirin EC 81 MG tablet Take 81 mg by mouth daily.   Yes [provider]  cholecalciferol (VITAMIN D) 1000 units  tablet Take 1,000 Units by mouth daily.   Yes [provider]  clopidogrel (PLAVIX) 75 MG tablet Take 75 mg by mouth every evening.    Yes [provider]  Cyanocobalamin (VITAMIN B-12 IJ) Inject 1 mL as directed every 30 (thirty) days.    Yes [provider]  Fluocinonide Emulsified Base 0.05 % CREA Apply 1 application topically daily as needed (Cuts and scrapes).  06/23/17  Yes [provider]  nitroGLYCERIN (NITROSTAT) 0.4 MG SL tablet Place 0.4 mg under the tongue every 5 (five) minutes as needed.  06/23/17  Yes [provider]  oxyCODONE (ROXICODONE) 15 MG immediate release tablet Take 15 mg by mouth every 4 (four) hours as needed. He takes 3-4 times a day as needed   Yes [provider]  vitamin B-12 (CYANOCOBALAMIN) 250 MCG tablet Take 250 mcg by mouth daily.   Yes [provider]    Physical Exam: Vitals:   06/28/19 1930 06/28/19 2000 06/28/19 2030 06/28/19 2033  BP: 132/76 125/60 121/74   Pulse:    (!) 105  Resp:      SpO2:    (!) 89%  Weight:      Height:        Constitutional: NAD, calm, comfortable Eyes: PERRL, lids and conjunctivae normal ENMT: Mucous membranes are moist. Posterior pharynx clear of any exudate or lesions. Neck: normal, supple, no masses, no thyromegaly Respiratory: clear to auscultation bilaterally on anterior exam, no wheezing, no crackles. Normal respiratory effort. No accessory muscle use.  Cardiovascular: Regular rate and rhythm, no murmurs / rubs / gallops. No extremity edema. 2+ pedal pulses. No carotid bruits.  Abdomen: Soft, no tenderness, no masses palpated. No hepatosplenomegaly. Bowel sounds positive.  Musculoskeletal: no clubbing / cyanosis.  Shortened and externally rotated LLE, LLE increased muscle tone.  Skin: Multiple areas of ecchymosis, particularly in upper extremities. Neurologic: CN 2-12 grossly intact. Sensation intact, DTR normal. Strength 5/5 in all 4.  Psychiatric: Normal judgment and insight. Alert and oriented x 3. Normal mood.   Labs on Admission: I have personally reviewed following labs and imaging studies  CBC: Recent Labs  Lab 06/28/19 1631  WBC 7.8  NEUTROABS 6.2  HGB 10.5*  HCT 33.0*  MCV 97.1  PLT 825*   Basic Metabolic Panel: Recent Labs  Lab 06/28/19 1631  NA 137  K 3.8  CL 101  CO2 25  GLUCOSE 101*  BUN 21  CREATININE 1.41*  CALCIUM 8.1*   GFR:  Estimated Creatinine Clearance: 43.2 mL/min (A) (by C-G formula based on SCr of 1.41 mg/dL (H)). Liver Function Tests: Recent Labs  Lab 06/28/19 1631  AST 22  ALT 26  ALKPHOS 123  BILITOT 0.5  PROT 5.8*  ALBUMIN 2.8*   No results for input(s): LIPASE, AMYLASE in the last 168 hours. No results for input(s): AMMONIA in the last 168 hours. Coagulation Profile: No results for input(s): INR, PROTIME in the last 168 hours. Cardiac Enzymes: No results for input(s): CKTOTAL, CKMB, CKMBINDEX, TROPONINI in the last 168 hours. BNP (last 3 results) No results for input(s): PROBNP in the last 8760 hours. HbA1C: No results for input(s): HGBA1C in the last 72 hours. CBG: No results for input(s): GLUCAP in the last 168 hours. Lipid Profile: No results for input(s): CHOL, HDL, LDLCALC, TRIG, CHOLHDL, LDLDIRECT in the last 72 hours. Thyroid Function Tests: No results for input(s): TSH, T4TOTAL, FREET4, T3FREE, THYROIDAB in the last 72 hours. Anemia Panel: No results for input(s): VITAMINB12,  FOLATE, FERRITIN, TIBC, IRON, RETICCTPCT in the last 72 hours. Urine analysis:    Component Value Date/Time   COLORURINE YELLOW 05/08/2018 [place] 05/08/2018 1422   LABSPEC 1.011 05/08/2018 1422   PHURINE 7.0 05/08/2018 1422   GLUCOSEU NEGATIVE 05/08/2018 1422   HGBUR SMALL (A) 05/08/2018 1422   BILIRUBINUR NEGATIVE 05/08/2018 1422   KETONESUR NEGATIVE 05/08/2018 1422   PROTEINUR NEGATIVE 05/08/2018 1422   UROBILINOGEN 0.2 05/03/2012 1559   NITRITE NEGATIVE 05/08/2018 1422   LEUKOCYTESUR NEGATIVE 05/08/2018 1422    Radiological Exams on Admission: Dg Chest Port 1 View  Result Date: 06/28/2019 CLINICAL DATA:  Fall, left hip fracture today.  Former smoker. EXAM: PORTABLE CHEST 1 VIEW COMPARISON:  11/04/2018 FINDINGS: Atherosclerotic calcification of the aortic arch. Indistinctness of the pulmonary vasculature with mild cephalization of blood flow. Old healed left rib fractures. I  suspect an old healed left scapular fracture. Heart size within normal limits. Lower cervical plate and screw fixator. IMPRESSION: 1. Mild indistinctness of the pulmonary vasculature. Given the lack of cardiomegaly I doubt that this is from significant pulmonary venous hypertension, and the cephalization of blood flow may be from the supine nature of this radiograph. Occasionally aggressive hydration can result in indistinct pulmonary vasculature. 2. Old healed left rib and scapular fractures. 3.  Aortic Atherosclerosis (ICD10-I70.0). Electronically Signed   By: Van Clines M.D.   On: 06/28/2019 18:32   Dg Hip Unilat W Or Wo Pelvis 2-3 Views Left  Result Date: 06/28/2019 CLINICAL DATA:  Pain EXAM: DG HIP (WITH OR WITHOUT PELVIS) 2-3V LEFT COMPARISON:  None. FINDINGS: There is an acute displaced transcervical fracture of the left femur. There is no dislocation. There are mild-to-moderate degenerative changes of both hips. IMPRESSION: Acute transcervical fracture of the proximal left femur. Electronically Signed   By: Constance Holster M.D.   On: 06/28/2019 17:55    EKG: Independently reviewed.  Still in ordered status.  Assessment/Plan Principal Problem:   Closed transcervical fracture of left femur (HCC) Admit to telemetry/inpatient. Keep n.p.o. after midnight. Hip fracture order set protocol. Analgesics as needed. Antiemetics as needed. Muscle relaxants as needed. Orthopedic surgery will evaluate.  Active Problems:   Coronary artery disease Holding aspirin and Plavix at this time. Check echocardiogram. Metoprolol as needed for tachycardia or hypertension.    Thrombocytopenia ([place])  Hold antiplatelet therapy preop. SCDs for DVT prophylaxis. Monitor platelets.    Hypertension Not on antihypertensives after weight loss. PRN antihypertensives while in the hospital. May need to be restarted on medical therapy.    GERD Protonix 40 mg p.o. daily.    Hyperlipidemia Currently  not on statin. To discuss with PCP or cardiology.    Anemia Check anemia panel. Monitor H&H.    DVT prophylaxis: SCDs. Code Status: Full code. Family Communication: His wife was present in the ED room. Disposition Plan: Transfer to [facility] for orthopedic surgery evaluation. Consults called: Dr. Mardelle Matte requested transfer to [facility] Admission status: Inpatient/telemetry.   Travis Milan MD [facility]  06/28/2019, 8:57 PM   This document was prepared using Dragon voice recognition software and may contain some unintended transcription errors.

## 2019-06-28 NOTE — ED Triage Notes (Addendum)
Pain to LT hip after falling.  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  States he thinks he heard a bone crack. Pt took 2 15mg  oxycodone x 40 min ago.  Pain has decreased.

## 2019-06-29 ENCOUNTER — Inpatient Hospital Stay: Admit: 2019-06-29 | Payer: Medicare Other | Admitting: Orthopedic Surgery

## 2019-06-29 ENCOUNTER — Inpatient Hospital Stay (HOSPITAL_COMMUNITY): Payer: Medicare Other

## 2019-06-29 ENCOUNTER — Encounter (HOSPITAL_COMMUNITY): Payer: Self-pay

## 2019-06-29 ENCOUNTER — Inpatient Hospital Stay (HOSPITAL_COMMUNITY): Payer: Medicare Other | Admitting: Anesthesiology

## 2019-06-29 ENCOUNTER — Other Ambulatory Visit: Payer: Self-pay

## 2019-06-29 ENCOUNTER — Encounter (HOSPITAL_COMMUNITY)
Admission: EM | Disposition: A | Discharge status: Home / Self Care | Payer: Self-pay | Source: Home / Self Care | Attending: Family Medicine

## 2019-06-29 DIAGNOSIS — S72032A Displaced midcervical fracture of left femur, initial encounter for closed fracture: Principal | ICD-10-CM

## 2019-06-29 DIAGNOSIS — I2510 Atherosclerotic heart disease of native coronary artery without angina pectoris: Secondary | ICD-10-CM

## 2019-06-29 HISTORY — PX: HIP ARTHROPLASTY: SHX981

## 2019-06-29 LAB — CBC
HCT: 33.5 % — ABNORMAL LOW (ref 39.0–52.0)
Hemoglobin: 10.6 g/dL — ABNORMAL LOW (ref 13.0–17.0)
MCH: 31.5 pg (ref 26.0–34.0)
MCHC: 31.6 g/dL (ref 30.0–36.0)
MCV: 99.4 fL (ref 80.0–100.0)
Platelets: 113 10*3/uL — ABNORMAL LOW (ref 150–400)
RBC: 3.37 MIL/uL — ABNORMAL LOW (ref 4.22–5.81)
RDW: 15.6 % — ABNORMAL HIGH (ref 11.5–15.5)
WBC: 11.3 10*3/uL — ABNORMAL HIGH (ref 4.0–10.5)
nRBC: 0 % (ref 0.0–0.2)

## 2019-06-29 LAB — BASIC METABOLIC PANEL
Anion gap: 7 (ref 5–15)
BUN: 22 mg/dL (ref 8–23)
CO2: 27 mmol/L (ref 22–32)
Calcium: 8.1 mg/dL — ABNORMAL LOW (ref 8.9–10.3)
Chloride: 103 mmol/L (ref 98–111)
Creatinine, Ser: 1.25 mg/dL — ABNORMAL HIGH (ref 0.61–1.24)
GFR calc Af Amer: >60 mL/min (ref >60)
GFR calc non Af Amer: 55 mL/min — ABNORMAL LOW (ref >60)
Glucose, Bld: 84 mg/dL (ref 70–99)
Potassium: 4 mmol/L (ref 3.5–5.1)
Sodium: 137 mmol/L (ref 135–145)

## 2019-06-29 LAB — FOLATE: Folate: 14.1 ng/mL (ref >5.9)

## 2019-06-29 LAB — SURGICAL PCR SCREEN
MRSA, PCR: NEGATIVE
Staphylococcus aureus: NEGATIVE

## 2019-06-29 LAB — RETICULOCYTES
Immature Retic Fract: 10.7 % (ref 2.3–15.9)
RBC.: 3.42 MIL/uL — ABNORMAL LOW (ref 4.22–5.81)
Retic Count, Absolute: 28.7 10*3/uL (ref 19.0–186.0)
Retic Ct Pct: 0.8 % (ref 0.4–3.1)

## 2019-06-29 LAB — ECHOCARDIOGRAM COMPLETE
Height: 74 in
Weight: 2349.22 oz

## 2019-06-29 LAB — IRON AND TIBC
Iron: 42 ug/dL — ABNORMAL LOW (ref 45–182)
Saturation Ratios: 21 % (ref 17.9–39.5)
TIBC: 203 ug/dL — ABNORMAL LOW (ref 250–450)
UIBC: 161 ug/dL

## 2019-06-29 LAB — TYPE AND SCREEN
ABO/RH(D): O POS
Antibody Screen: NEGATIVE

## 2019-06-29 LAB — VITAMIN B12: Vitamin B-12: 4397 pg/mL — ABNORMAL HIGH (ref 180–914)

## 2019-06-29 LAB — FERRITIN: Ferritin: 244 ng/mL (ref 24–336)

## 2019-06-29 SURGERY — HEMIARTHROPLASTY, HIP, DIRECT ANTERIOR APPROACH, FOR FRACTURE
Anesthesia: General | Site: Hip | Laterality: Left

## 2019-06-29 MED ORDER — MIDAZOLAM HCL 2 MG/2ML IJ SOLN
INTRAMUSCULAR | Status: AC
  Filled 2019-06-29: qty 2

## 2019-06-29 MED ORDER — SUCCINYLCHOLINE CHLORIDE 200 MG/10ML IV SOSY
PREFILLED_SYRINGE | INTRAVENOUS | Status: DC | PRN
  Administered 2019-06-29: 100 mg via INTRAVENOUS

## 2019-06-29 MED ORDER — ROCURONIUM BROMIDE 10 MG/ML (PF) SYRINGE
PREFILLED_SYRINGE | INTRAVENOUS | Status: DC | PRN
  Administered 2019-06-29: 10 mg via INTRAVENOUS
  Administered 2019-06-29: 30 mg via INTRAVENOUS

## 2019-06-29 MED ORDER — OXYCODONE HCL 15 MG PO TABS: 15.0000 mg | ORAL_TABLET | ORAL | 0 refills | Status: DC | PRN

## 2019-06-29 MED ORDER — OXYCODONE HCL 5 MG/5ML PO SOLN: 5.0000 mg | Freq: Once | ORAL | Status: DC | PRN

## 2019-06-29 MED ORDER — SUGAMMADEX SODIUM 200 MG/2ML IV SOLN
INTRAVENOUS | Status: DC | PRN
  Administered 2019-06-29: 140 mg via INTRAVENOUS

## 2019-06-29 MED ORDER — DEXAMETHASONE SODIUM PHOSPHATE 10 MG/ML IJ SOLN
INTRAMUSCULAR | Status: DC | PRN
  Administered 2019-06-29: 10 mg via INTRAVENOUS

## 2019-06-29 MED ORDER — PROMETHAZINE HCL 25 MG/ML IJ SOLN: 6.2500 mg | INTRAMUSCULAR | Status: DC | PRN

## 2019-06-29 MED ORDER — FENTANYL CITRATE (PF) 100 MCG/2ML IJ SOLN
INTRAMUSCULAR | Status: AC
  Filled 2019-06-29: qty 2

## 2019-06-29 MED ORDER — POTASSIUM CHLORIDE IN NACL 20-0.9 MEQ/L-% IV SOLN
INTRAVENOUS | Status: DC
  Administered 2019-06-29 – 2019-06-30 (×2): via INTRAVENOUS
  Filled 2019-06-29 (×2): qty 1000

## 2019-06-29 MED ORDER — ONDANSETRON HCL 4 MG/2ML IJ SOLN
INTRAMUSCULAR | Status: AC
  Filled 2019-06-29: qty 2

## 2019-06-29 MED ORDER — ONDANSETRON HCL 4 MG PO TABS: 4.0000 mg | ORAL_TABLET | Freq: Four times a day (QID) | ORAL | Status: DC | PRN

## 2019-06-29 MED ORDER — OXYCODONE HCL 5 MG PO TABS
5.0000 mg | ORAL_TABLET | ORAL | Status: DC | PRN
  Administered 2019-06-30: 5 mg via ORAL
  Filled 2019-06-29: qty 1

## 2019-06-29 MED ORDER — ADULT MULTIVITAMIN W/MINERALS CH
1.0000 | ORAL_TABLET | Freq: Every day | ORAL | Status: DC
  Administered 2019-06-30: 1 via ORAL
  Filled 2019-06-29 (×2): qty 1

## 2019-06-29 MED ORDER — ALUM & MAG HYDROXIDE-SIMETH 200-200-20 MG/5ML PO SUSP: 30.0000 mL | ORAL | Status: DC | PRN

## 2019-06-29 MED ORDER — PROPOFOL 10 MG/ML IV BOLUS
INTRAVENOUS | Status: AC
  Filled 2019-06-29: qty 20

## 2019-06-29 MED ORDER — CEFAZOLIN SODIUM-DEXTROSE 2-4 GM/100ML-% IV SOLN
2.0000 g | Freq: Four times a day (QID) | INTRAVENOUS | Status: AC
  Administered 2019-06-29 – 2019-06-30 (×2): 2 g via INTRAVENOUS
  Filled 2019-06-29 (×2): qty 100

## 2019-06-29 MED ORDER — SENNA-DOCUSATE SODIUM 8.6-50 MG PO TABS: 2.0000 | ORAL_TABLET | Freq: Every day | ORAL | 1 refills | Status: DC

## 2019-06-29 MED ORDER — PHENYLEPHRINE 40 MCG/ML (10ML) SYRINGE FOR IV PUSH (FOR BLOOD PRESSURE SUPPORT)
PREFILLED_SYRINGE | INTRAVENOUS | Status: DC | PRN
  Administered 2019-06-29 (×3): 80 ug via INTRAVENOUS

## 2019-06-29 MED ORDER — ACETAMINOPHEN 325 MG PO TABS: 325.0000 mg | ORAL_TABLET | Freq: Four times a day (QID) | ORAL | Status: DC | PRN

## 2019-06-29 MED ORDER — SODIUM CHLORIDE 0.9 % IR SOLN
Status: DC | PRN
  Administered 2019-06-29: 1000 mL

## 2019-06-29 MED ORDER — OXYCODONE HCL 5 MG PO TABS: 5.0000 mg | ORAL_TABLET | Freq: Once | ORAL | Status: DC | PRN

## 2019-06-29 MED ORDER — METHOCARBAMOL 500 MG IVPB - SIMPLE MED
500.0000 mg | Freq: Four times a day (QID) | INTRAVENOUS | Status: DC | PRN
  Administered 2019-06-29: 500 mg via INTRAVENOUS
  Filled 2019-06-29: qty 50

## 2019-06-29 MED ORDER — PROPOFOL 10 MG/ML IV BOLUS
INTRAVENOUS | Status: DC | PRN
  Administered 2019-06-29: 110 mg via INTRAVENOUS

## 2019-06-29 MED ORDER — LIDOCAINE 2% (20 MG/ML) 5 ML SYRINGE
INTRAMUSCULAR | Status: AC
  Filled 2019-06-29: qty 5

## 2019-06-29 MED ORDER — ENSURE SURGERY PO LIQD
237.0000 mL | Freq: Two times a day (BID) | ORAL | Status: DC
  Administered 2019-06-29 – 2019-06-30 (×3): 237 mL via ORAL
  Filled 2019-06-29 (×3): qty 237

## 2019-06-29 MED ORDER — ONDANSETRON HCL 4 MG/2ML IJ SOLN: 4.0000 mg | Freq: Four times a day (QID) | INTRAMUSCULAR | Status: DC | PRN

## 2019-06-29 MED ORDER — MAGNESIUM CITRATE PO SOLN: 1.0000 | Freq: Once | ORAL | Status: DC | PRN

## 2019-06-29 MED ORDER — POLYETHYLENE GLYCOL 3350 17 G PO PACK: 17.0000 g | PACK | Freq: Every day | ORAL | Status: DC | PRN

## 2019-06-29 MED ORDER — OXYCODONE HCL 5 MG PO TABS: 10.0000 mg | ORAL_TABLET | ORAL | Status: DC | PRN

## 2019-06-29 MED ORDER — HYDROMORPHONE HCL 1 MG/ML IJ SOLN
INTRAMUSCULAR | Status: AC
  Filled 2019-06-29: qty 2

## 2019-06-29 MED ORDER — METHOCARBAMOL 1000 MG/10ML IJ SOLN
500.0000 mg | Freq: Three times a day (TID) | INTRAVENOUS | Status: DC | PRN
  Administered 2019-06-29: 500 mg via INTRAVENOUS
  Filled 2019-06-29: qty 500

## 2019-06-29 MED ORDER — HYDROMORPHONE HCL 1 MG/ML IJ SOLN
0.2500 mg | INTRAMUSCULAR | Status: DC | PRN
  Administered 2019-06-29 (×4): 0.5 mg via INTRAVENOUS

## 2019-06-29 MED ORDER — DOCUSATE SODIUM 100 MG PO CAPS
100.0000 mg | ORAL_CAPSULE | Freq: Two times a day (BID) | ORAL | Status: DC
  Administered 2019-06-29 – 2019-06-30 (×2): 100 mg via ORAL
  Filled 2019-06-29 (×2): qty 1

## 2019-06-29 MED ORDER — STERILE WATER FOR IRRIGATION IR SOLN
Status: DC | PRN
  Administered 2019-06-29 (×2): 1000 mL

## 2019-06-29 MED ORDER — BUPIVACAINE HCL (PF) 0.25 % IJ SOLN
INTRAMUSCULAR | Status: AC
  Filled 2019-06-29: qty 30

## 2019-06-29 MED ORDER — PERFLUTREN LIPID MICROSPHERE
1.0000 mL | INTRAVENOUS | Status: AC | PRN
  Administered 2019-06-29: 2 mL via INTRAVENOUS
  Filled 2019-06-29: qty 10

## 2019-06-29 MED ORDER — LACTATED RINGERS IV SOLN
INTRAVENOUS | Status: DC | PRN
  Administered 2019-06-29 (×2): via INTRAVENOUS

## 2019-06-29 MED ORDER — ACETAMINOPHEN 10 MG/ML IV SOLN
1000.0000 mg | Freq: Once | INTRAVENOUS | Status: DC | PRN
  Administered 2019-06-29: 1000 mg via INTRAVENOUS

## 2019-06-29 MED ORDER — POTASSIUM CHLORIDE IN NACL 20-0.9 MEQ/L-% IV SOLN
INTRAVENOUS | Status: DC
  Administered 2019-06-29: 11:00:00 via INTRAVENOUS
  Filled 2019-06-29: qty 1000

## 2019-06-29 MED ORDER — DEXAMETHASONE SODIUM PHOSPHATE 10 MG/ML IJ SOLN
INTRAMUSCULAR | Status: AC
  Filled 2019-06-29: qty 1

## 2019-06-29 MED ORDER — MENTHOL 3 MG MT LOZG
1.0000 | LOZENGE | OROMUCOSAL | Status: DC | PRN
  Filled 2019-06-29: qty 9

## 2019-06-29 MED ORDER — BISACODYL 10 MG RE SUPP: 10.0000 mg | Freq: Every day | RECTAL | Status: DC | PRN

## 2019-06-29 MED ORDER — HYDROMORPHONE HCL 1 MG/ML IJ SOLN
1.0000 mg | INTRAMUSCULAR | Status: AC
  Administered 2019-06-29: 1 mg via INTRAVENOUS

## 2019-06-29 MED ORDER — HYDROMORPHONE HCL 1 MG/ML IJ SOLN: 0.5000 mg | INTRAMUSCULAR | Status: DC | PRN

## 2019-06-29 MED ORDER — ONDANSETRON HCL 4 MG/2ML IJ SOLN
INTRAMUSCULAR | Status: DC | PRN
  Administered 2019-06-29: 4 mg via INTRAVENOUS

## 2019-06-29 MED ORDER — FENTANYL CITRATE (PF) 100 MCG/2ML IJ SOLN
INTRAMUSCULAR | Status: DC | PRN
  Administered 2019-06-29 (×2): 50 ug via INTRAVENOUS

## 2019-06-29 MED ORDER — SODIUM CHLORIDE 0.9% IV SOLUTION: Freq: Once | INTRAVENOUS | Status: DC

## 2019-06-29 MED ORDER — BUPIVACAINE HCL (PF) 0.25 % IJ SOLN
INTRAMUSCULAR | Status: DC | PRN
  Administered 2019-06-29: 30 mL

## 2019-06-29 MED ORDER — METOCLOPRAMIDE HCL 5 MG PO TABS: 5.0000 mg | ORAL_TABLET | Freq: Three times a day (TID) | ORAL | Status: DC | PRN

## 2019-06-29 MED ORDER — FERROUS SULFATE 325 (65 FE) MG PO TABS
325.0000 mg | ORAL_TABLET | Freq: Three times a day (TID) | ORAL | Status: DC
  Administered 2019-06-29 – 2019-06-30 (×3): 325 mg via ORAL
  Filled 2019-06-29 (×3): qty 1

## 2019-06-29 MED ORDER — PHENOL 1.4 % MT LIQD: 1.0000 | OROMUCOSAL | Status: DC | PRN

## 2019-06-29 MED ORDER — KETOROLAC TROMETHAMINE 30 MG/ML IJ SOLN
INTRAMUSCULAR | Status: DC | PRN
  Administered 2019-06-29: 30 mg

## 2019-06-29 MED ORDER — ENOXAPARIN SODIUM 40 MG/0.4ML ~~LOC~~ SOLN: 40.0000 mg | SUBCUTANEOUS | 0 refills | Status: DC

## 2019-06-29 MED ORDER — METOCLOPRAMIDE HCL 5 MG/ML IJ SOLN: 5.0000 mg | Freq: Three times a day (TID) | INTRAMUSCULAR | Status: DC | PRN

## 2019-06-29 MED ORDER — ACETAMINOPHEN 500 MG PO TABS
1000.0000 mg | ORAL_TABLET | Freq: Four times a day (QID) | ORAL | Status: DC
  Administered 2019-06-29 – 2019-06-30 (×3): 1000 mg via ORAL
  Filled 2019-06-29 (×3): qty 2

## 2019-06-29 MED ORDER — KETAMINE HCL 10 MG/ML IJ SOLN
INTRAMUSCULAR | Status: DC | PRN
  Administered 2019-06-29: 20 mg via INTRAVENOUS
  Administered 2019-06-29: 10 mg via INTRAVENOUS

## 2019-06-29 MED ORDER — KETOROLAC TROMETHAMINE 30 MG/ML IJ SOLN
INTRAMUSCULAR | Status: AC
  Filled 2019-06-29: qty 1

## 2019-06-29 MED ORDER — METHOCARBAMOL 500 MG IVPB - SIMPLE MED
INTRAVENOUS | Status: AC
  Filled 2019-06-29: qty 50

## 2019-06-29 MED ORDER — BACLOFEN 10 MG PO TABS: 10.0000 mg | ORAL_TABLET | Freq: Three times a day (TID) | ORAL | 0 refills | Status: DC

## 2019-06-29 MED ORDER — KETOROLAC TROMETHAMINE 15 MG/ML IJ SOLN
7.5000 mg | Freq: Four times a day (QID) | INTRAMUSCULAR | Status: AC
  Administered 2019-06-29 – 2019-06-30 (×4): 7.5 mg via INTRAVENOUS
  Filled 2019-06-29 (×4): qty 1

## 2019-06-29 MED ORDER — ACETAMINOPHEN 10 MG/ML IV SOLN
INTRAVENOUS | Status: AC
  Filled 2019-06-29: qty 100

## 2019-06-29 MED ORDER — ENOXAPARIN SODIUM 40 MG/0.4ML ~~LOC~~ SOLN
40.0000 mg | SUBCUTANEOUS | Status: DC
  Administered 2019-06-30: 40 mg via SUBCUTANEOUS
  Filled 2019-06-29: qty 0.4

## 2019-06-29 MED ORDER — MIDAZOLAM HCL 2 MG/2ML IJ SOLN
INTRAMUSCULAR | Status: DC | PRN
  Administered 2019-06-29: 1 mg via INTRAVENOUS

## 2019-06-29 MED ORDER — LIDOCAINE 2% (20 MG/ML) 5 ML SYRINGE
INTRAMUSCULAR | Status: DC | PRN
  Administered 2019-06-29: 60 mg via INTRAVENOUS

## 2019-06-29 MED ORDER — METHOCARBAMOL 500 MG PO TABS: 500.0000 mg | ORAL_TABLET | Freq: Four times a day (QID) | ORAL | Status: DC | PRN

## 2019-06-29 SURGICAL SUPPLY — 51 items
BLADE SAW SGTL 73X25 THK (BLADE) ×2 IMPLANT
BRUSH FEMORAL CANAL (MISCELLANEOUS) ×1 IMPLANT
CLSR STERI-STRIP ANTIMIC 1/2X4 (GAUZE/BANDAGES/DRESSINGS) ×2 IMPLANT
COVER SURGICAL LIGHT HANDLE (MISCELLANEOUS) ×2 IMPLANT
COVER WAND RF STERILE (DRAPES) IMPLANT
DRAPE INCISE IOBAN 66X45 STRL (DRAPES) ×2 IMPLANT
DRAPE ORTHO SPLIT 77X108 STRL (DRAPES) ×4
DRAPE SURG ORHT 6 SPLT 77X108 (DRAPES) ×2 IMPLANT
DRAPE U-SHAPE 47X51 STRL (DRAPES) ×2 IMPLANT
DRESSING ALLEVYN LIFE SACRUM (GAUZE/BANDAGES/DRESSINGS) ×1 IMPLANT
DRSG AQUACEL AG ADV 3.5X10 (GAUZE/BANDAGES/DRESSINGS) ×1 IMPLANT
DRSG AQUACEL AG ADV 3.5X14 (GAUZE/BANDAGES/DRESSINGS) IMPLANT
DRSG MEPILEX BORDER 4X8 (GAUZE/BANDAGES/DRESSINGS) ×2 IMPLANT
DURAPREP 26ML APPLICATOR (WOUND CARE) ×2 IMPLANT
ELECT REM PT RETURN 15FT ADLT (MISCELLANEOUS) ×2 IMPLANT
GLOVE BIOGEL PI IND STRL 8 (GLOVE) ×1 IMPLANT
GLOVE BIOGEL PI INDICATOR 8 (GLOVE) ×1
GLOVE ECLIPSE 7.5 STRL STRAW (GLOVE) ×2 IMPLANT
GLOVE ORTHO TXT STRL SZ7.5 (GLOVE) ×2 IMPLANT
GOWN STRL REUS W/TWL LRG LVL3 (GOWN DISPOSABLE) ×2 IMPLANT
HANDPIECE INTERPULSE COAX TIP (DISPOSABLE)
HEAD FEM UNIPOLAR 54 OD STRL (Hips) ×1 IMPLANT
KIT BASIN OR (CUSTOM PROCEDURE TRAY) ×2 IMPLANT
KIT TURNOVER KIT A (KITS) IMPLANT
NDL MA TROC 1/2 (NEEDLE) ×1 IMPLANT
NEEDLE MA TROC 1/2 (NEEDLE) ×2 IMPLANT
NS IRRIG 1000ML POUR BTL (IV SOLUTION) ×2 IMPLANT
PACK TOTAL JOINT (CUSTOM PROCEDURE TRAY) ×2 IMPLANT
PACK TOTAL KNEE CUSTOM (KITS) IMPLANT
PASSER SUT SWANSON 36MM LOOP (INSTRUMENTS) ×2 IMPLANT
PRESSURIZER FEMORAL UNIV (MISCELLANEOUS) ×1 IMPLANT
PROTECTOR NERVE ULNAR (MISCELLANEOUS) ×2 IMPLANT
SET HNDPC FAN SPRY TIP SCT (DISPOSABLE) ×1 IMPLANT
SPACER FEM TAPERED +0 12/14 (Hips) ×1 IMPLANT
SPONGE LAP 4X18 RFD (DISPOSABLE) ×1 IMPLANT
STEM SUMMIT PRESSFIT HIP SZ7 (Hips) ×1 IMPLANT
STRIP CLOSURE SKIN 1/4X4 (GAUZE/BANDAGES/DRESSINGS) ×2 IMPLANT
SUT ETHIBOND NAB CT1 #1 30IN (SUTURE) ×2 IMPLANT
SUT FIBERWIRE #2 38 T-5 BLUE (SUTURE) ×4
SUT MNCRL AB 4-0 PS2 18 (SUTURE) ×3 IMPLANT
SUT VIC AB 0 CT1 36 (SUTURE) ×1 IMPLANT
SUT VIC AB 1 CT1 27 (SUTURE)
SUT VIC AB 1 CT1 27XBRD ANTBC (SUTURE) IMPLANT
SUT VIC AB 2-0 CT1 27 (SUTURE) ×2
SUT VIC AB 2-0 CT1 TAPERPNT 27 (SUTURE) ×2 IMPLANT
SUT VIC AB 3-0 SH 8-18 (SUTURE) ×2 IMPLANT
SUTURE FIBERWR #2 38 T-5 BLUE (SUTURE) ×3 IMPLANT
TOWEL OR 17X26 10 PK STRL BLUE (TOWEL DISPOSABLE) ×6 IMPLANT
TOWER CARTRIDGE SMART MIX (DISPOSABLE) ×1 IMPLANT
TRAY FOLEY MTR SLVR 16FR STAT (SET/KITS/TRAYS/PACK) IMPLANT
WATER STERILE IRR 1000ML POUR (IV SOLUTION) ×2 IMPLANT

## 2019-06-29 NOTE — Anesthesia Preprocedure Evaluation (Addendum)
Anesthesia Evaluation  Patient identified by MRN, date of birth, ID band Patient awake    Reviewed: Allergy & Precautions, NPO status , Patient's Chart, lab work & pertinent test results  History of Anesthesia Complications (+) history of anesthetic complications  Airway Mallampati: II  TM Distance: >3 FB Neck ROM: Full    Dental  (+) Dental Advisory Given   Pulmonary neg pulmonary ROS, former smoker,    Pulmonary exam normal breath sounds clear to auscultation       Cardiovascular hypertension, Pt. on medications + CAD (on Plavix)  Normal cardiovascular exam Rhythm:Regular Rate:Normal  Echo 06/29/2019  1. The left ventricle has normal systolic function with an ejection fraction of 60-65%. The cavity size was normal. Left ventricular diastolic Doppler parameters are consistent with impaired relaxation. No evidence of left ventricular regional wall motion abnormalities.  2. Left atrial size was not assessed.  3. The aortic valve was not well visualized. No stenosis of the aortic valve.  4. The interatrial septum was not assessed.   Neuro/Psych  Neuromuscular disease negative psych ROS   GI/Hepatic negative GI ROS, Neg liver ROS, GERD  ,  Endo/Other  negative endocrine ROS  Renal/GU Renal InsufficiencyRenal diseasenegative Renal ROS     Musculoskeletal negative musculoskeletal ROS (+)   Abdominal   Peds  Hematology negative hematology ROS (+) anemia , Hgb 10.6   Anesthesia Other Findings Day of surgery medications reviewed with the patient.  Reproductive/Obstetrics                            Anesthesia Physical Anesthesia Plan  ASA: III  Anesthesia Plan: General   Post-op Pain Management:    Induction: Intravenous  PONV Risk Score and Plan: 3 and Treatment may vary due to age or medical condition, Ondansetron and Dexamethasone  Airway Management Planned: Oral ETT  Additional  Equipment: None  Intra-op Plan:   Post-operative Plan: Extubation in OR  Informed Consent: I have reviewed the patients History and Physical, chart, labs and discussed the procedure including the risks, benefits and alternatives for the proposed anesthesia with the patient or authorized representative who has indicated his/her understanding and acceptance.     Dental advisory given  Plan Discussed with: CRNA  Anesthesia Plan Comments:        Anesthesia Quick Evaluation

## 2019-06-29 NOTE — Discharge Instructions (Signed)

## 2019-06-29 NOTE — Anesthesia Procedure Notes (Signed)
Procedure Name: Intubation Date/Time: 06/29/2019 1:14 PM Performed by: Sharlette Dense, CRNA Patient Re-evaluated:Patient Re-evaluated prior to induction Oxygen Delivery Method: Circle system utilized Preoxygenation: Pre-oxygenation with 100% oxygen Induction Type: IV induction and Rapid sequence Laryngoscope Size: Miller and 3 Grade View: Grade I Tube type: Oral Tube size: 8.0 mm Number of attempts: 1 Airway Equipment and Method: Stylet Placement Confirmation: ETT inserted through vocal cords under direct vision,  breath sounds checked- equal and bilateral and positive ETCO2 Secured at: 22 cm Tube secured with: Tape Dental Injury: Teeth and Oropharynx as per pre-operative assessment

## 2019-06-29 NOTE — Progress Notes (Signed)
Initial Nutrition Assessment  RD working remotely.   DOCUMENTATION CODES:   (unable to assess for malnutrition at this time.)  INTERVENTION:  - diet advancement as medically feasible. - will order Ensure Surgery BID, each supplement provides 330 kcal and 18 grams protein. - will order daily multivitamin with minerals. - encourage PO intakes with diet advancement.   NUTRITION DIAGNOSIS:   Increased nutrient needs related to post-op healing as evidenced by estimated needs.  GOAL:   Patient will meet greater than or equal to 90% of their needs  MONITOR:   Diet advancement, PO intake, Supplement acceptance, Labs, Weight trends, Skin  REASON FOR ASSESSMENT:   Consult Assessment of nutrition requirement/status  ASSESSMENT:   77 y.o. male with medical history significant of stage 3 CKD, CAD, hyperlipidemia, HTN, BPH, history of post-op acute urinary retention requiring Foley catheter. He presented to the ED via EMS after a fall at home. He reported having chronic LLE weakness 2/2 to multiple back surgeries/sciatica pain. When he fell he hit his left hip area, heard a crack, and immediately felt pain. He was subsequently unable to stand on his own.  Current weight is 147 lb and most recent weight PTA was on 11/14/18 when he weighed 169 lb. This indicates 21 lb weight loss (12% body weight) in the past 13.5 months. Heart Healthy diet was ordered yesterday at 7 PM and then patient was NPO since midnight for surgery today. Ensure Pre-surgery given x1 last night. Patient is currently out of the room, in OR; unable to obtain nutrition-related information at this time.  Per notes: - closed transcervical L femur fracture and in surgery for the same.    Medications reviewed. Labs reviewed; creatinine: 1.25 mg/dl, Ca: 8.1 mg/dl, GFR: 55 ml/min. IVF; NS-20 mEq IV KCl @ 100 ml/hr.      NUTRITION - FOCUSED PHYSICAL EXAM:  unable to complete at this time.  Diet Order:   Diet Order             Diet NPO time specified  Diet effective midnight              EDUCATION NEEDS:   No education needs have been identified at this time  Skin:  Skin Assessment: Reviewed RN Assessment  Last BM:  7/1  Height:   Ht Readings from Last 1 Encounters:  06/29/19 6\' 2"  (1.88 m)    Weight:   Wt Readings from Last 1 Encounters:  06/29/19 66.6 kg    Ideal Body Weight:  86.4 kg  BMI:  Body mass index is 18.85 kg/m.  Estimated Nutritional Needs:   Kcal:  1900-2100  Protein:  85-95 grams  Fluid:  >/= 2 L/day      Jarome Matin, MS, RD, LDN, [facility] Inpatient Clinical Dietitian Pager # [phone] After hours/weekend pager # [phone]

## 2019-06-29 NOTE — Transfer of Care (Signed)
Immediate Anesthesia Transfer of Care Note  Patient: Travis Woodward.  Procedure(s) Performed: ARTHROPLASTY BIPOLAR HIP (HEMIARTHROPLASTY) (Left Hip)  Patient Location: PACU  Anesthesia Type:General  Level of Consciousness: drowsy  Airway & Oxygen Therapy: Patient Spontanous Breathing and Patient connected to face mask oxygen  Post-op Assessment: Report given to RN and Post -op Vital signs reviewed and stable  Post vital signs: Reviewed and stable  Last Vitals:  Vitals Value Taken Time  BP 120/69 06/29/19 1505  Temp    Pulse 68 06/29/19 1508  Resp 7 06/29/19 1508  SpO2 99 % 06/29/19 1508  Vitals shown include unvalidated device data.  Last Pain:  Vitals:   06/29/19 1139  TempSrc:   PainSc: 7       Patients Stated Pain Goal: 4 (12/13/23 4695)  Complications: No apparent anesthesia complications

## 2019-06-29 NOTE — Op Note (Addendum)
06/28/2019 - 06/29/2019  2:27 PM  PATIENT:  Travis Woodward.   MRN: [id number]  PRE-OPERATIVE DIAGNOSIS:  left hip femoral neck fracture, displaced  POST-OPERATIVE DIAGNOSIS:    PROCEDURE:  Procedure(s): ARTHROPLASTY BIPOLAR HIP (HEMIARTHROPLASTY)  PREOPERATIVE INDICATIONS:  Travis Woodward. is an 78 y.o. male who was admitted 06/28/2019 with a diagnosis of Closed transcervical fracture of left femur ([place]) and elected for surgical management.  The risks benefits and alternatives were discussed with the patient including but not limited to the risks of nonoperative treatment, versus surgical intervention including infection, bleeding, nerve injury, periprosthetic fracture, the need for revision surgery, dislocation, leg length discrepancy, blood clots, cardiopulmonary complications, morbidity, mortality, among others, and they were willing to proceed.  He was transferred from [facility].  OPERATIVE REPORT     SURGEON:  Marchia Bond, MD    ASSISTANT:  Joya Gaskins, OPA-C  (Present throughout the entire procedure,  necessary for completion of procedure in a timely manner, assisting with retraction, instrumentation, and closure)     ANESTHESIA:  General  ESTIMATED BLOOD LOSS: 670LI    COMPLICATIONS:  None.   UNIQUE ASPECTS OF THE CASE:      COMPONENTS:  Chemical engineer Femoral Fracture stem size 7, with a 0 spacer and a fracture head unipolar hip ball.    PROCEDURE IN DETAIL: The patient was met in the holding area and identified.  The appropriate hip  was marked at the operative site. The patient was then transported to the OR and  placed under anesthesia.  At that point, the patient was  placed in the lateral decubitus position with the operative side up and  secured to the operating room table and all bony prominences padded.     The operative lower extremity was prepped from the iliac crest to the toes.  Sterile draping was performed.  Time out was performed prior to  incision.      A routine posterolateral approach was utilized via sharp dissection  carried down to the subcutaneous tissue.  Gross bleeders were Bovie  coagulated.  The iliotibial band was identified and incised  along the length of the skin incision.  Self-retaining retractors were  inserted.  With the hip internally rotated, the short external rotators  were identified. The piriformis was tagged with FiberWire, and the hip capsule released in a T-type fashion.  The femoral neck was exposed, and I resected the femoral neck using the appropriate jig. This was performed at approximately a thumb's breadth above the lesser trochanter.    I then exposed the deep acetabulum, cleared out any tissue including the ligamentum teres, and included the hip capsule in the FiberWire used above and below the T.    I then prepared the proximal femur using the cookie-cutter, the lateralizing reamer, and then sequentially broached.  A trial utilized, and I reduced the hip and it was found to have excellent stability with functional range of motion. The trial components were then removed.   I then place the real implant and I impacted the real head ball into place. The hip was then reduced and taken through functional range of motion and found to have excellent stability. Leg lengths were restored.  I then used a 2 mm drill bits to pass the FiberWire suture from the capsule and piriformis through the greater trochanter, and secured this. Excellent posterior capsular repair was achieved. I also closed the T in the capsule.  I then irrigated the  hip copiously again with pulse lavage, and repaired the fascia with Vicryl, followed by Vicryl for the subcutaneous tissue, Monocryl for the skin, Steri-Strips and sterile gauze. The wounds were injected. The patient was then awakened and returned to PACU in stable and satisfactory condition. There were no complications.  Marchia Bond, MD Orthopedic Surgeon [phone]    06/29/2019 2:27 PM

## 2019-06-29 NOTE — Progress Notes (Signed)
  Echocardiogram 2D Echocardiogram with definity has been performed.  Darlina Sicilian M 06/29/2019, 8:28 AM

## 2019-06-29 NOTE — Progress Notes (Signed)
   06/29/19 1825  Vitals  Temp 97.7 F (36.5 C)  Temp Source Oral  BP 100/61  MAP (mmHg) 72  BP Location Left Arm  BP Method Automatic  Patient Position (if appropriate) Lying  Pulse Rate (!) 103  Pulse Rate Source Monitor  Resp 13  Oxygen Therapy  SpO2 100 %  O2 Device Room Air  Patient with Yellow MEWS, vitals as followed. Unsure why MEWS flagging Yellow, however, patient is already on q1 hour vitals x4, then q4 x24hours.

## 2019-06-29 NOTE — Progress Notes (Signed)
PROGRESS NOTE    Travis Woodward.  JJH:[id number] DOB: [birth year] DOA: 06/28/2019 PCP: Redmond School, MD   Brief Narrative:   Travis Woodward. is Travis Woodward 78 y.o. male with medical history significant of stage III chronic kidney disease, coronary artery disease, hyperlipidemia, hypertension, BPH, history of postop acute urinary retention requiring Foley catheter who is brought to the emergency department via EMS after falling at home.  The patient describes he has chronic left lower extremity weakness secondary to multiple back surgeries/sciatica and felt that he is LLE gave out.  He fell hitting his left hip area and states that he heard Kyli Sorter crack, feeling immediate pain and having inability to stand up on his own.  He denies lightheadedness, chest pain, palpitations, nausea or emesis or any other symptoms prior to his fall.  He denies headache, sore throat, rhinorrhea, dyspnea, wheezing or hemoptysis.  No abdominal pain, diarrhea, constipation, melena or hematochezia.  No dysuria, frequency or hematuria.  Denies polyuria, polydipsia, polyphagia or blurred vision.  ED Course: Initial vital signs pulse 84, respiration 12, blood pressure 124/75 mmHg and O2 sat 97% on room air.  The patient received hydromorphone 2 mg IVP x1, then morphine sulfate 6 mg IVP x1 dose, then another milligram of hydromorphone IVP.  I also added Toradol 15 mg IVP x1 and Robaxin 500 mg IVPB x1 after the patient complaining of severe muscle spasms.  His white count was 7.8, hemoglobin 10.5 g/dL and platelets 103.  CMP shows normal electrolytes when calcium is corrected to albumin.  Glucose 101, creatinine 1.41 and calcium 8.1 mg/dL.  Total protein was 5.8 and albumin 2.8 g/dL.  The rest of the hepatic functions are within normal limits.  Imaging: left hip shows acute transcervical fracture of the proximal left femur.  Chest radiograph shows mild indistinctness of the pulmonary vasculature, old left healed rib /left scapular  fractures.  Please see images and full radiology report for further detail.   Assessment & Plan:   Principal Problem:   Closed transcervical fracture of left femur (HCC) Active Problems:   GERD   Coronary artery disease   Hyperlipidemia   Anemia   Thrombocytopenia (HCC)   Hypertension   Closed transcervical fracture of left femur ([place]) Plan for OR today with orthopedics Hip fracture order set protocol. Analgesics as needed. Antiemetics as needed. Muscle relaxants as needed. RCRI is 1 for hx of CAD, echo with normal EF  Coronary artery disease Holding aspirin and Plavix at this time. Check echocardiogram (as noted below) Metoprolol as needed for tachycardia or hypertension.  Thrombocytopenia ([place])  Hold antiplatelet therapy preop. SCDs for DVT prophylaxis. Monitor platelets.  Hypertension Not on antihypertensives after weight loss. PRN antihypertensives while in the hospital. May need to be restarted on medical therapy.  GERD Protonix 40 mg p.o. daily.  Hyperlipidemia Currently not on statin. To discuss with PCP or cardiology.  Anemia Iron def and AOCD Normal B12 and folate CTM, retics hypoprolif    DVT prophylaxis: SCD Code Status: full  Family Communication: none at bedside Disposition Plan: pending further improvement, surgical fixation of L femur   Consultants:   ortho  Procedures:  Echo IMPRESSIONS    1. The left ventricle has normal systolic function with an ejection fraction of 60-65%. The cavity size was normal. Left ventricular diastolic Doppler parameters are consistent with impaired relaxation. No evidence of left ventricular regional wall  motion abnormalities.  2. Left atrial size was not assessed.  3. The aortic valve  was not well visualized. No stenosis of the aortic valve.  4. The interatrial septum was not assessed.   Antimicrobials:  Anti-infectives (From admission, onward)   Start     Dose/Rate Route Frequency  Ordered Stop   06/29/19 0600  ceFAZolin (ANCEF) IVPB 2g/100 mL premix     2 g 200 mL/hr over 30 Minutes Intravenous On call to O.R. 06/28/19 2301 06/30/19 0559        Subjective: Seen before going down to OR Denies CP or SOB.  Walks at home with walking stick.  LLE gave out.  Objective: Vitals:   06/28/19 2239 06/29/19 0554 06/29/19 1125 06/29/19 1139  BP: 119/62 (!) 110/58 (!) 114/47   Pulse: 95 69 77   Resp:  12 18   Temp: 99 F (37.2 C) 98.1 F (36.7 C) 98.7 F (37.1 C)   TempSrc: Oral Oral Oral   SpO2: 100% 100% 99%   Weight: 66.6 kg   66.6 kg  Height: 6\' 2"  (1.88 m)   6\' 2"  (1.88 m)    Intake/Output Summary (Last 24 hours) at 06/29/2019 1142 Last data filed at 06/29/2019 0200 Gross per 24 hour  Intake 1100 ml  Output -  Net 1100 ml   Filed Weights   06/28/19 1548 06/28/19 2239 06/29/19 1139  Weight: 70.8 kg 66.6 kg 66.6 kg    Examination:  General exam: Appears calm and comfortable  Respiratory system: Clear to auscultation. Respiratory effort normal. Cardiovascular system: S1 & S2 heard, RRR.  Gastrointestinal system: Abdomen is nondistended, soft and nontender.  Central nervous system: Alert and oriented. No focal neurological deficits. Extremities: LLE shortened and externally rotated Skin: No rashes, lesions or ulcers Psychiatry: Judgement and insight appear normal. Mood & affect appropriate.     Data Reviewed: I have personally reviewed following labs and imaging studies  CBC: Recent Labs  Lab 06/28/19 1631 06/29/19 0449  WBC 7.8 11.3*  NEUTROABS 6.2  --   HGB 10.5* 10.6*  HCT 33.0* 33.5*  MCV 97.1 99.4  PLT 103* 401*   Basic Metabolic Panel: Recent Labs  Lab 06/28/19 1631 06/29/19 0449  NA 137 137  K 3.8 4.0  CL 101 103  CO2 25 27  GLUCOSE 101* 84  BUN 21 22  CREATININE 1.41* 1.25*  CALCIUM 8.1* 8.1*   GFR: Estimated Creatinine Clearance: 45.9 mL/min (Amelita Risinger) (by C-G formula based on SCr of 1.25 mg/dL (H)). Liver Function Tests:  Recent Labs  Lab 06/28/19 1631  AST 22  ALT 26  ALKPHOS 123  BILITOT 0.5  PROT 5.8*  ALBUMIN 2.8*   No results for input(s): LIPASE, AMYLASE in the last 168 hours. No results for input(s): AMMONIA in the last 168 hours. Coagulation Profile: No results for input(s): INR, PROTIME in the last 168 hours. Cardiac Enzymes: No results for input(s): CKTOTAL, CKMB, CKMBINDEX, TROPONINI in the last 168 hours. BNP (last 3 results) No results for input(s): PROBNP in the last 8760 hours. HbA1C: No results for input(s): HGBA1C in the last 72 hours. CBG: No results for input(s): GLUCAP in the last 168 hours. Lipid Profile: No results for input(s): CHOL, HDL, LDLCALC, TRIG, CHOLHDL, LDLDIRECT in the last 72 hours. Thyroid Function Tests: No results for input(s): TSH, T4TOTAL, FREET4, T3FREE, THYROIDAB in the last 72 hours. Anemia Panel: Recent Labs    06/29/19 0449  VITAMINB12 4,397*  FOLATE 14.1  FERRITIN 244  TIBC 203*  IRON 42*  RETICCTPCT 0.8   Sepsis Labs: No results for input(s): PROCALCITON,  LATICACIDVEN in the last 168 hours.  Recent Results (from the past 240 hour(s))  SARS Coronavirus 2 (CEPHEID - Performed in [place] hospital lab), Hosp Order     Status: None   Collection Time: 06/28/19  5:16 PM   Specimen: Nasopharyngeal Swab  Result Value Ref Range Status   SARS Coronavirus 2 NEGATIVE NEGATIVE Final    Comment: (NOTE) If result is NEGATIVE SARS-CoV-2 target nucleic acids are NOT DETECTED. The SARS-CoV-2 RNA is generally detectable in upper and lower  respiratory specimens during the acute phase of infection. The lowest  concentration of SARS-CoV-2 viral copies this assay can detect is 250  copies / mL. Qualyn Oyervides negative result does not preclude SARS-CoV-2 infection  and should not be used as the sole basis for treatment or other  patient management decisions.  Dionna Wiedemann negative result may occur with  improper specimen collection / handling, submission of specimen other   than nasopharyngeal swab, presence of viral mutation(s) within the  areas targeted by this assay, and inadequate number of viral copies  (<250 copies / mL). Shereda Graw negative result must be combined with clinical  observations, patient history, and epidemiological information. If result is POSITIVE SARS-CoV-2 target nucleic acids are DETECTED. The SARS-CoV-2 RNA is generally detectable in upper and lower  respiratory specimens dur ing the acute phase of infection.  Positive  results are indicative of active infection with SARS-CoV-2.  Clinical  correlation with patient history and other diagnostic information is  necessary to determine patient infection status.  Positive results do  not rule out bacterial infection or co-infection with other viruses. If result is PRESUMPTIVE POSTIVE SARS-CoV-2 nucleic acids MAY BE PRESENT.   Ellan Tess presumptive positive result was obtained on the submitted specimen  and confirmed on repeat testing.  While 2019 novel coronavirus  (SARS-CoV-2) nucleic acids may be present in the submitted sample  additional confirmatory testing may be necessary for epidemiological  and / or clinical management purposes  to differentiate between  SARS-CoV-2 and other Sarbecovirus currently known to infect humans.  If clinically indicated additional testing with an alternate test  methodology [phone]) is advised. The SARS-CoV-2 RNA is generally  detectable in upper and lower respiratory sp ecimens during the acute  phase of infection. The expected result is Negative. Fact Sheet for Patients:  StrictlyIdeas.no Fact Sheet for Healthcare Providers: BankingDealers.co.za This test is not yet approved or cleared by the [place] FDA and has been authorized for detection and/or diagnosis of SARS-CoV-2 by FDA under an Emergency Use Authorization (EUA).  This EUA will remain in effect (meaning this test can be used) for the duration of the  COVID-19 declaration under Section 564(b)(1) of the Act, 21 U.S.C. section 360bbb-3(b)(1), unless the authorization is terminated or revoked sooner. Performed at [facility], [address], [place], [place] [address]   Surgical PCR screen     Status: None   Collection Time: 06/28/19 11:20 PM   Specimen: Nasal Mucosa; Nasal Swab  Result Value Ref Range Status   MRSA, PCR NEGATIVE NEGATIVE Final   Staphylococcus aureus NEGATIVE NEGATIVE Final    Comment: (NOTE) The Xpert SA Assay (FDA approved for NASAL specimens in patients 77 years of age and older), is one component of [place] [place]op comprehensive surveillance program. It is not intended to diagnose infection nor to guide or monitor treatment. Performed at [facility], [place] [address], [place], [place] [address]          Radiology Studies: Dg Chest [place] 1  View  Result Date: 06/28/2019 CLINICAL DATA:  Fall, left hip fracture today.  Former smoker. EXAM: PORTABLE CHEST 1 VIEW COMPARISON:  11/04/2018 FINDINGS: Atherosclerotic calcification of the aortic arch. Indistinctness of the pulmonary vasculature with mild cephalization of blood flow. Old healed left rib fractures. I suspect an old healed left scapular fracture. Heart size within normal limits. Lower cervical plate and screw fixator. IMPRESSION: 1. Mild indistinctness of the pulmonary vasculature. Given the lack of cardiomegaly I doubt that this is from significant pulmonary venous hypertension, and the cephalization of blood flow may be from the supine nature of this radiograph. Occasionally aggressive hydration can result in indistinct pulmonary vasculature. 2. Old healed left rib and scapular fractures. 3.  Aortic Atherosclerosis (ICD10-I70.0). Electronically Signed   By: Van Clines M.D.   On: 06/28/2019 18:32   Dg Hip Unilat W Or Wo Pelvis 2-3 Views Left  Result Date: 06/28/2019 CLINICAL DATA:  Pain EXAM: DG HIP (WITH OR WITHOUT PELVIS) 2-3V LEFT COMPARISON:   None. FINDINGS: There is an acute displaced transcervical fracture of the left femur. There is no dislocation. There are mild-to-moderate degenerative changes of both hips. IMPRESSION: Acute transcervical fracture of the proximal left femur. Electronically Signed   By: Constance Holster M.D.   On: 06/28/2019 17:55        Scheduled Meds: . povidone-iodine  2 application Topical Once   Continuous Infusions: . 0.9 % NaCl with KCl 20 mEq / L 100 mL/hr at 06/29/19 1036  .  ceFAZolin (ANCEF) IV    . [MAR Hold] methocarbamol (ROBAXIN) IV 500 mg (06/29/19 1035)  . tranexamic acid       LOS: 1 day    Time spent: over 30 min    Fayrene Helper, MD [facility] Pager AMION  If 7PM-7AM, please contact night-coverage [email or web address] Password [facility] 06/29/2019, 11:42 AM

## 2019-06-29 NOTE — Consult Note (Signed)
ORTHOPAEDIC CONSULTATION  REQUESTING PHYSICIAN: Elodia Florence., *  Chief Complaint: Left hip pain  HPI: Travis Woodward. is a 78 y.o. male who complains of acute severe left hip pain after mechanical fall.  He has pre-existing neurologic dysfunction of the left lower extremity due to previous back surgeries.  He felt like his leg gave way and he broke his hip.  He was transferred from [facility] for definitive management.  He has multiple coexisting comorbidities as listed below.  Pain worse with movement, better with rest, he is complaining of severe spasm in the left leg at this point.  Denies any other injuries in the fall.  Past Medical History:  Diagnosis Date  . Chronic kidney disease    stage 3  . Complication of anesthesia    difficulty urinating   . Coronary artery disease   . Enlarged prostate   . Hyperlipidemia   . Hypertension    Past Surgical History:  Procedure Laterality Date  . CARDIAC CATHETERIZATION  2005  . CHOLECYSTECTOMY N/A 07/19/2017   Procedure: LAPAROSCOPIC CHOLECYSTECTOMY;  Surgeon: Aviva Signs, MD;  Location: [facility];  Service: General;  Laterality: N/A;  . CORONARY STENT PLACEMENT  2005  . LAPAROSCOPIC APPENDECTOMY  04/17/2012   Procedure: APPENDECTOMY LAPAROSCOPIC;  Surgeon: Jamesetta So, MD;  Location: [facility];  Service: General;  Laterality: N/A;   Social History   Socioeconomic History  . Marital status: Married    Spouse name: Not on file  . Number of children: Not on file  . Years of education: Not on file  . Highest education level: Not on file  Occupational History  . Not on file  Social Needs  . Financial resource strain: Not on file  . Food insecurity    Worry: Not on file    Inability: Not on file  . Transportation needs    Medical: Not on file    Non-medical: Not on file  Tobacco Use  . Smoking status: Former Smoker    Quit date: 07/16/1972    Years since quitting: 46.9  . Smokeless tobacco: Never Used  Substance  and Sexual Activity  . Alcohol use: No  . Drug use: No  . Sexual activity: Never  Lifestyle  . Physical activity    Days per week: Not on file    Minutes per session: Not on file  . Stress: Not on file  Relationships  . Social Herbalist on phone: Not on file    Gets together: Not on file    Attends religious service: Not on file    Active member of club or organization: Not on file    Attends meetings of clubs or organizations: Not on file    Relationship status: Not on file  Other Topics Concern  . Not on file  Social History Narrative  . Not on file   Family History  Problem Relation Age of Onset  . Heart attack Mother    Allergies  Allergen Reactions  . Crestor [Rosuvastatin]   . Paroxetine Rash     Positive ROS: All other systems have been reviewed and were otherwise negative with the exception of those mentioned in the HPI and as above.  Physical Exam: General: Alert, no acute distress Cardiovascular: Mild pedal edema Respiratory: No cyanosis, no use of accessory musculature GI: No organomegaly, abdomen is soft and non-tender Skin: No lesions in the area of chief complaint Neurologic: Sensation intact distally grossly in the  lower extremity Psychiatric: Patient is competent for consent with normal mood and affect Lymphatic: No axillary or cervical lymphadenopathy  MUSCULOSKELETAL: Left lower extremity has EHL and FHL that are intact although he has a little bit of weakness with dorsiflexion, I think this is from his previous spine surgery.  Positive logroll.  Assessment: Principal Problem:   Closed transcervical fracture of left femur (HCC) Active Problems:   GERD   Coronary artery disease   Hyperlipidemia   Anemia   Thrombocytopenia (HCC)   Hypertension    Plan: Left hip hemiarthroplasty.  The risks benefits and alternatives were discussed with the patient including but not limited to the risks of nonoperative treatment, versus surgical  intervention including infection, bleeding, nerve injury, periprosthetic fracture, the need for revision surgery, dislocation, leg length discrepancy, blood clots, cardiopulmonary complications, morbidity, mortality, among others, and they were willing to proceed.      Johnny Bridge, MD Cell [phone]   06/29/2019 10:25 AM

## 2019-06-30 LAB — BASIC METABOLIC PANEL
Anion gap: 3 — ABNORMAL LOW (ref 5–15)
BUN: 26 mg/dL — ABNORMAL HIGH (ref 8–23)
CO2: 28 mmol/L (ref 22–32)
Calcium: 8 mg/dL — ABNORMAL LOW (ref 8.9–10.3)
Chloride: 106 mmol/L (ref 98–111)
Creatinine, Ser: 1.2 mg/dL (ref 0.61–1.24)
GFR calc Af Amer: >60 mL/min (ref >60)
GFR calc non Af Amer: 58 mL/min — ABNORMAL LOW (ref >60)
Glucose, Bld: 106 mg/dL — ABNORMAL HIGH (ref 70–99)
Potassium: 4.9 mmol/L (ref 3.5–5.1)
Sodium: 137 mmol/L (ref 135–145)

## 2019-06-30 LAB — CBC
HCT: 23.8 % — ABNORMAL LOW (ref 39.0–52.0)
HCT: 24.8 % — ABNORMAL LOW (ref 39.0–52.0)
Hemoglobin: 7.6 g/dL — ABNORMAL LOW (ref 13.0–17.0)
Hemoglobin: 7.9 g/dL — ABNORMAL LOW (ref 13.0–17.0)
MCH: 31.5 pg (ref 26.0–34.0)
MCH: 31.3 pg (ref 26.0–34.0)
MCHC: 31.9 g/dL (ref 30.0–36.0)
MCHC: 31.9 g/dL (ref 30.0–36.0)
MCV: 98.8 fL (ref 80.0–100.0)
MCV: 98.4 fL (ref 80.0–100.0)
Platelets: 89 10*3/uL — ABNORMAL LOW (ref 150–400)
Platelets: 99 10*3/uL — ABNORMAL LOW (ref 150–400)
RBC: 2.41 MIL/uL — ABNORMAL LOW (ref 4.22–5.81)
RBC: 2.52 MIL/uL — ABNORMAL LOW (ref 4.22–5.81)
RDW: 15.5 % (ref 11.5–15.5)
RDW: 15.9 % — ABNORMAL HIGH (ref 11.5–15.5)
WBC: 11.3 10*3/uL — ABNORMAL HIGH (ref 4.0–10.5)
WBC: 12.5 10*3/uL — ABNORMAL HIGH (ref 4.0–10.5)
nRBC: 0 % (ref 0.0–0.2)
nRBC: 0 % (ref 0.0–0.2)

## 2019-06-30 LAB — ABO/RH: ABO/RH(D): O POS

## 2019-06-30 MED ORDER — FERROUS SULFATE 325 (65 FE) MG PO TABS: 325.0000 mg | ORAL_TABLET | Freq: Every day | ORAL | 0 refills | Status: DC

## 2019-06-30 MED ORDER — ACETAMINOPHEN 325 MG PO TABS: 325.0000 mg | ORAL_TABLET | Freq: Four times a day (QID) | ORAL | Status: DC | PRN

## 2019-06-30 NOTE — Care Management Important Message (Signed)
Important Message  Patient Details  Name: Travis Woodward. MRN: [id number] Date of Birth: [birth year]   Medicare Important Message Given:  Yes. CMA printed out the IM for the CSW or Case Manager Nurse to give to the patient.      Daylen Lipsky 06/30/2019, 8:20 AM

## 2019-06-30 NOTE — Progress Notes (Signed)
Spoke with pt who referred CM to his wife. Pt's wife declined HHPT at present time because she is skilled in that area and will assist pt. However, she did want RW and 3 in one for pt. Insurance company is closed and we are unable to his co pay for any medications at this time.

## 2019-06-30 NOTE — Evaluation (Signed)
Occupational Therapy Evaluation Patient Details Name: Travis Woodward. MRN: [id number] DOB: 01/29/1941 Today's Date: 06/30/2019    History of Present Illness Travis Woodward. is a 78 y.o. male who suffered L transcervical femur fx and is s/p L THA (06/29/19) PJA:SNKNL III chronic kidney disease, coronary artery disease, hyperlipidemia, hypertension, BPH, history of postop acute urinary retention requiring Foley catheter   Clinical Impression   Pt was admitted for the above. At baseline, he is mod I and uses a walking stick. He will benefit from continued OT to reinforce THPS and increase independence with adls.  Goals are for min guard in acute setting due to hx of LLE giving.  He has chronic pain and weakness in it.  Wife can assist with ADLs at home     Follow Up Recommendations  Supervision/Assistance - 24 hour    Equipment Recommendations  None recommended by OT(as long as he has 3:1)    Recommendations for Other Services       Precautions / Restrictions Precautions Precautions: Posterior Hip Restrictions Weight Bearing Restrictions: Yes LLE Weight Bearing: Weight bearing as tolerated      Mobility Bed Mobility Overal bed mobility: Needs Assistance Bed Mobility: Supine to Sit     Supine to sit: Min assist     General bed mobility comments: for LLE   Transfers Overall transfer level: Needs assistance Equipment used: Rolling walker (2 wheeled) Transfers: Sit to/from Stand Sit to Stand: Min assist         General transfer comment: light assistance to stand and steady    Balance Overall balance assessment: History of Falls(Pt states LLE gave; chronic pain in it)                                         ADL either performed or assessed with clinical judgement   ADL Overall ADL's : Needs assistance/impaired Eating/Feeding: Independent   Grooming: Set up   Upper Body Bathing: Supervision/ safety   Lower Body Bathing: Minimal assistance;Sit  to/from stand;With adaptive equipment   Upper Body Dressing : Supervision/safety   Lower Body Dressing: Maximal assistance;Sit to/from stand;With adaptive equipment   Toilet Transfer: Minimal assistance;+2 for safety/equipment;Ambulation(simulated to chAir)   Toileting- Clothing Manipulation and Hygiene: Minimal assistance;Sit to/from stand         General ADL Comments: Pt has a Secondary school teacher. Educated on AE. He feels he will have his wife assist with socks. Can get a long sponge.  His wife was a caregiver, so he has DME at home. Reviewed posterior THPS; will need reinforcement. Pt is bad about leaning forward in sitting     Vision         Perception     Praxis      Pertinent Vitals/Pain Pain Assessment: Faces Faces Pain Scale: Hurts even more Pain Location: L hip with WB Pain Descriptors / Indicators: Grimacing;Operative site guarding Pain Intervention(s): Limited activity within patient's tolerance;Monitored during session;Premedicated before session;Repositioned;Ice applied     Hand Dominance     Extremity/Trunk Assessment Upper Extremity Assessment Upper Extremity Assessment: Defer to OT evaluation   Lower Extremity Assessment Lower Extremity Assessment: LLE deficits/detail;Generalized weakness LLE: Unable to fully assess due to pain       Communication     Cognition Arousal/Alertness: Awake/alert Behavior During Therapy: WFL for tasks assessed/performed Overall Cognitive Status: Within Functional Limits for tasks assessed  General Comments: pt talks a lot; need to get his attention; needs reinforcement with posterior THPs   General Comments       Exercises     Shoulder Instructions      Home Living Family/patient expects to be discharged to:: Private residence Living Arrangements: Spouse/significant other;Other relatives Available Help at Discharge: Family Type of Home: House Home Access: Stairs to  enter [employer] of Steps: 2 + landing Entrance Stairs-Rails: Can reach both       Bathroom Shower/Tub: Occupational psychologist: Handicapped height     Home Equipment: Bedside commode;Walker - 2 wheels;Cane - single point;Manufacturing systems engineer)   Additional Comments: and wife's ex-husband      Prior Functioning/Environment Level of Independence: Independent with assistive device(s)        Comments: walking stick        OT Problem List: Decreased strength;Decreased activity tolerance;Decreased knowledge of use of DME or AE;Decreased knowledge of precautions;Pain      OT Treatment/Interventions: Self-care/ADL training;DME and/or AE instruction;Patient/family education;Balance training;Therapeutic activities    OT Goals(Current goals can be found in the care plan section) Acute Rehab OT Goals Patient Stated Goal: return to independence OT Goal Formulation: With patient Time For Goal Achievement: 07/14/19 Potential to Achieve Goals: Good ADL Goals Pt Will Transfer to Toilet: with min guard assist;ambulating;bedside commode Pt Will Perform Toileting - Clothing Manipulation and hygiene: with min guard assist;sit to/from stand Pt Will Perform Tub/Shower Transfer: Shower transfer;with min guard assist;ambulating;3 in 1 Additional ADL Goal #1: pt will not need any cues for THPS during adls and perform with min guard level with AE (no socks)  OT Frequency: Min 2X/week   Barriers to D/C:            Co-evaluation PT/OT/SLP Co-Evaluation/Treatment: Yes Reason for Co-Treatment: For patient/therapist safety PT goals addressed during session: Mobility/safety with mobility OT goals addressed during session: ADL's and self-care      AM-PAC OT "6 Clicks" Daily Activity     Outcome Measure Help from another person eating meals?: None Help from another person taking care of personal grooming?: A Little Help from another person toileting, which includes using  toliet, bedpan, or urinal?: A Little Help from another person bathing (including washing, rinsing, drying)?: A Lot Help from another person to put on and taking off regular upper body clothing?: A Little Help from another person to put on and taking off regular lower body clothing?: A Lot 6 Click Score: 17   End of Session    Activity Tolerance: Patient tolerated treatment well Patient left: in chair;with call bell/phone within reach;with chair alarm set  OT Visit Diagnosis: Pain Pain - Right/Left: Left Pain - part of body: Hip                Time: 1000-1026 OT Time Calculation (min): 26 min Charges:  OT General Charges $OT Visit: 1 Visit OT Evaluation $OT Eval Low Complexity: 1 Low  Lesle Chris, OTR/L Acute Rehabilitation Services [phone] [facility] pager [phone] office 06/30/2019  [place]i [place] 06/30/2019, 10:39 AM

## 2019-06-30 NOTE — Progress Notes (Signed)
Assumed care from previous RN. Agree with earlier assessment. Continue to monitor. Amora Sheehy A Wael Maestas, RN 

## 2019-06-30 NOTE — Discharge Summary (Signed)
Physician Discharge Summary  Travis Woodward. IRW:[id number] DOB: 03/05/1941 DOA: 06/28/2019  PCP: Travis School, MD  Admit date: 06/28/2019 Discharge date: 06/30/2019  Time spent: 40 minutes  Recommendations for Outpatient Follow-up:  1. Follow outpatient CBC/CMP 2. Follow up with cardiology regarding DAPT, currently on hold as pt started on lovenox 3. Follow outpatient with orthopedics as scheduled 4. Follow anemia and thrombocytopenia outpatient    Discharge Diagnoses:  Principal Problem:   Closed transcervical fracture of left femur ([place]) Active Problems:   GERD   Coronary artery disease   Hyperlipidemia   Anemia   Thrombocytopenia (HCC)   Hypertension   Discharge Condition: stable  Diet recommendation: heart healthy  Filed Weights   06/28/19 1548 06/28/19 2239 06/29/19 1139  Weight: 70.8 kg 66.6 kg 66.6 kg    History of present illness:  Travis Woodwardis Travis Woodward 78 y.o.malewith medical history significant ofstage III chronic kidney disease, coronary artery disease, hyperlipidemia, hypertension, BPH, history of postop acute urinary retention requiring Foley catheter who is brought to the emergency department via EMS after falling at home. The patient describes he has chronic left lower extremity weakness secondary to multiple back surgeries/sciatica and felt that he is LLE gave out. He fell hitting his left hip area and states that he heard [name] crack, feeling immediate pain and having inability to stand up on his own. He denies lightheadedness, chest pain, palpitations, nausea or emesis or any other symptoms prior to his fall. He denies headache, sore throat, rhinorrhea, dyspnea, wheezing or hemoptysis. No abdominal pain, diarrhea, constipation, melena or hematochezia. No dysuria, frequency or hematuria. Denies polyuria, polydipsia, polyphagia or blurred vision.  ED Course:Initial vital signs pulse 84, respiration 12, blood pressure 124/75 mmHg and O2 sat 97% on room  air. The patient received hydromorphone 2 mg IVP x1, then morphine sulfate 6 mg IVP x1 dose, then another milligram of hydromorphone IVP. I also added Toradol 15 mg IVP x1 and Robaxin 500 mg IVPB x1 after the patient complaining of severe muscle spasms.  His white count was 7.8, hemoglobin 10.5 g/dL and platelets 103. CMP shows normal electrolytes when calcium is corrected to albumin. Glucose 101, creatinine 1.41 and calcium 8.1 mg/dL. Total protein was 5.8 and albumin 2.8 g/dL. The rest of the hepatic functions are within normal limits.  He was admitted for [name] femur fracture.  He's now s/p surgical fixation by orthopedics.  He was discharged on 7/3 with plans for outpatient ortho follow up.  They declined home health.   See below for additional details  Hospital Course:  Closed transcervical fracture of left femur ([name]) S/p L hip arthroplasty bipolar hip Discharged on lovenox for dvt ppx PT/OT recommending home health, but family declined  Coronary artery disease Holding aspirin and Plavix at this time -> stent was 2005, follow up with cards outpatient regarding whether to restart this (discussed with pt) Check echocardiogram (as noted below)  Thrombocytopenia ([name])  Hold antiplatelet therapy preop. Continue to follow outpatient   Hypertension Continue to monitor, not on bp meds  GERD Protonix 40 mg p.o. daily.  Hyperlipidemia Currently not on statin. To discuss with PCP or cardiology.  Anemia Iron def and AOCD Normal B12 and folate CTM, retics hypoprolif  Iron   Procedures: Echo IMPRESSIONS    1. The left ventricle has normal systolic function with an ejection fraction of 60-65%. The cavity size was normal. Left ventricular diastolic Doppler parameters are consistent with impaired relaxation. No evidence of left ventricular  regional wall  motion abnormalities.  2. Left atrial size was not assessed.  3. The aortic valve was not well visualized. No  stenosis of the aortic valve.  4. The interatrial septum was not assessed.  Left ARTHROPLASTY BIPOLAR HIP (HEMIARTHROPLASTY) on 7/2 by ortho  Consultations:  orthopedics  Discharge Exam: Vitals:   06/30/19 0422 06/30/19 1000  BP: (!) 118/56 91/62  Pulse: 66 83  Resp: 18 14  Temp: 97.8 F (36.6 C) 98.1 F (36.7 C)  SpO2: 99% 100%   Feels well, just walked around room with PT/OT Discussed d/c plan with wife.  She was nurse and feels comfortable with lovenox injections Discussed d/c plan   General: No acute distress. Cardiovascular: Heart sounds show Atiana Levier regular rate, and rhythm.  Lungs: Clear to auscultation bilaterally  Abdomen: Soft, nontender, nondistended  Neurological: Alert and oriented 3. Moves all extremities 4. Cranial nerves II through XII grossly intact. Skin: Warm and dry. No rashes or lesions. Extremities: LLE with dressing intact Psychiatric: Mood and affect are normal. Insight and judgment are appropriate.  Discharge Instructions   Discharge Instructions    Call MD for:  difficulty breathing, headache or visual disturbances   Complete by: As directed    Call MD for:  extreme fatigue   Complete by: As directed    Call MD for:  hives   Complete by: As directed    Call MD for:  persistant dizziness or light-headedness   Complete by: As directed    Call MD for:  persistant nausea and vomiting   Complete by: As directed    Call MD for:  redness, tenderness, or signs of infection (pain, swelling, redness, odor or green/yellow discharge around incision site)   Complete by: As directed    Call MD for:  severe uncontrolled pain   Complete by: As directed    Call MD for:  temperature >100.4   Complete by: As directed    Diet - low sodium heart healthy   Complete by: As directed    Discharge instructions   Complete by: As directed    You were seen for Devonda Pequignot femur fracture.  You have been surgically repaired by orthopedics.    Please follow up as an  outpatient with ortho as scheduled.  Follow up with your PCP for osteoporosis work up and treatment.  You were started on lovenox for DVT prophylaxis.  You'll take this for 28 days post op.  We've stopped your aspirin and plavix.  Please follow up with your cardiologist to discuss whether you need to restart these as an outpatient.  We started you on iron for low blood counts.    Return for new, recurrent, or worsening symptoms.  Please ask your PCP to request records from this hospitalization so they know what was done and what the next steps will be.   Increase activity slowly   Complete by: As directed      Allergies as of 06/30/2019      Reactions   Crestor [rosuvastatin]    Paroxetine Rash      Medication List    STOP taking these medications   aspirin EC 81 MG tablet   clopidogrel 75 MG tablet Commonly known as: PLAVIX     TAKE these medications   acetaminophen 325 MG tablet Commonly known as: TYLENOL Take 1-2 tablets (325-650 mg total) by mouth every 6 (six) hours as needed for mild pain (pain score 1-3 or temp > 100.5).   baclofen 10  MG tablet Commonly known as: LIORESAL Take 1 tablet (10 mg total) by mouth 3 (three) times daily. As needed for muscle spasm   cholecalciferol 1000 units tablet Commonly known as: VITAMIN D Take 1,000 Units by mouth daily.   enoxaparin 40 MG/0.4ML injection Commonly known as: Lovenox Inject 0.4 mLs (40 mg total) into the skin daily for 28 days.   ferrous sulfate 325 (65 FE) MG tablet Take 1 tablet (325 mg total) by mouth daily.   Fluocinonide Emulsified Base 0.05 % Crea Apply 1 application topically daily as needed (Cuts and scrapes).   nitroGLYCERIN 0.4 MG SL tablet Commonly known as: NITROSTAT Place 0.4 mg under the tongue every 5 (five) minutes as needed.   oxyCODONE 15 MG immediate release tablet Commonly known as: ROXICODONE Take 1 tablet (15 mg total) by mouth every 4 (four) hours as needed. He takes 3-4 times Allayah Raineri day  as needed   sennosides-docusate sodium 8.6-50 MG tablet Commonly known as: SENOKOT-S Take 2 tablets by mouth daily.   vitamin B-12 250 MCG tablet Commonly known as: CYANOCOBALAMIN Take 250 mcg by mouth daily.   VITAMIN B-12 IJ Inject 1 mL as directed every 30 (thirty) days.            Durable Medical Equipment  (From admission, onward)         Start     Ordered   06/30/19 1603  For home use only DME Bedside commode  Once    Question:  Patient needs Jimya Ciani bedside commode to treat with the following condition  Answer:  Fear for personal safety   06/30/19 1603   06/30/19 1601  For home use only DME Walker rolling  Once    Question:  Patient needs Veronica Guerrant walker to treat with the following condition  Answer:  Fear for personal safety   06/30/19 1601         Allergies  Allergen Reactions  . Crestor [Rosuvastatin]   . Paroxetine Rash   Follow-up Information    Marchia Bond, MD. Schedule an appointment as soon as possible for Nyra Anspaugh visit in 2 weeks.   Specialty: Orthopedic Surgery Contact information: [address] [place] [address] [phone]            The results of significant diagnostics from this hospitalization (including imaging, microbiology, ancillary and laboratory) are listed below for reference.    Significant Diagnostic Studies: Pelvis Portable  Result Date: 06/29/2019 CLINICAL DATA:  Status post left hip hemiarthroplasty EXAM: PORTABLE PELVIS 1-2 VIEWS COMPARISON:  None. FINDINGS: Left hip prosthesis is noted in satisfactory position. No other fracture is noted. No soft tissue abnormality is seen. IMPRESSION: Status post left hip hemiarthroplasty. Electronically Signed   By: Inez Catalina M.D.   On: 06/29/2019 16:34   Dg Chest Port 1 View  Result Date: 06/28/2019 CLINICAL DATA:  Fall, left hip fracture today.  Former smoker. EXAM: PORTABLE CHEST 1 VIEW COMPARISON:  11/04/2018 FINDINGS: Atherosclerotic calcification of the aortic arch.  Indistinctness of the pulmonary vasculature with mild cephalization of blood flow. Old healed left rib fractures. I suspect an old healed left scapular fracture. Heart size within normal limits. Lower cervical plate and screw fixator. IMPRESSION: 1. Mild indistinctness of the pulmonary vasculature. Given the lack of cardiomegaly I doubt that this is from significant pulmonary venous hypertension, and the cephalization of blood flow may be from the supine nature of this radiograph. Occasionally aggressive hydration can result in indistinct pulmonary vasculature. 2. Old healed  left rib and scapular fractures. 3.  Aortic Atherosclerosis (ICD10-I70.0). Electronically Signed   By: Van Clines M.D.   On: 06/28/2019 18:32   Dg Hip Port Unilat With Pelvis 1v Left  Result Date: 06/29/2019 CLINICAL DATA:  Left hip hemiarthroplasty EXAM: DG HIP (WITH OR WITHOUT PELVIS) 1V PORT LEFT COMPARISON:  06/28/2019 FINDINGS: Femoral prosthesis is now seen and well seated. No acute bony or soft tissue abnormality is noted. IMPRESSION: Status post left hip hemiarthroplasty. Electronically Signed   By: Inez Catalina M.D.   On: 06/29/2019 16:33   Dg Hip Unilat W Or Wo Pelvis 2-3 Views Left  Result Date: 06/28/2019 CLINICAL DATA:  Pain EXAM: DG HIP (WITH OR WITHOUT PELVIS) 2-3V LEFT COMPARISON:  None. FINDINGS: There is an acute displaced transcervical fracture of the left femur. There is no dislocation. There are mild-to-moderate degenerative changes of both hips. IMPRESSION: Acute transcervical fracture of the proximal left femur. Electronically Signed   By: Constance Holster M.D.   On: 06/28/2019 17:55    Microbiology: Recent Results (from the past 240 hour(s))  SARS Coronavirus 2 (CEPHEID - Performed in [place] hospital lab), Hosp Order     Status: None   Collection Time: 06/28/19  5:16 PM   Specimen: Nasopharyngeal Swab  Result Value Ref Range Status   SARS Coronavirus 2 NEGATIVE NEGATIVE Final    Comment:  (NOTE) If result is NEGATIVE SARS-CoV-2 target nucleic acids are NOT DETECTED. The SARS-CoV-2 RNA is generally detectable in upper and lower  respiratory specimens during the acute phase of infection. The lowest  concentration of SARS-CoV-2 viral copies this assay can detect is 250  copies / mL. Kamilah Correia negative result does not preclude SARS-CoV-2 infection  and should not be used as the sole basis for treatment or other  patient management decisions.  Neveah Bang negative result may occur with  improper specimen collection / handling, submission of specimen other  than nasopharyngeal swab, presence of viral mutation(s) within the  areas targeted by this assay, and inadequate number of viral copies  (<250 copies / mL). Yula Crotwell negative result must be combined with clinical  observations, patient history, and epidemiological information. If result is POSITIVE SARS-CoV-2 target nucleic acids are DETECTED. The SARS-CoV-2 RNA is generally detectable in upper and lower  respiratory specimens dur ing the acute phase of infection.  Positive  results are indicative of active infection with SARS-CoV-2.  Clinical  correlation with patient history and other diagnostic information is  necessary to determine patient infection status.  Positive results do  not rule out bacterial infection or co-infection with other viruses. If result is PRESUMPTIVE POSTIVE SARS-CoV-2 nucleic acids MAY BE PRESENT.   Jodye Scali presumptive positive result was obtained on the submitted specimen  and confirmed on repeat testing.  While 2019 novel coronavirus  (SARS-CoV-2) nucleic acids may be present in the submitted sample  additional confirmatory testing may be necessary for epidemiological  and / or clinical management purposes  to differentiate between  SARS-CoV-2 and other Sarbecovirus currently known to infect humans.  If clinically indicated additional testing with an alternate test  methodology [id number]) is advised. The SARS-CoV-2 RNA is  generally  detectable in upper and lower respiratory sp ecimens during the acute  phase of infection. The expected result is Negative. Fact Sheet for Patients:  StrictlyIdeas.no Fact Sheet for Healthcare Providers: BankingDealers.co.za This test is not yet approved or cleared by the [place] FDA and has been authorized for detection and/or diagnosis of SARS-CoV-2 by FDA  under an Emergency Use Authorization (EUA).  This EUA will remain in effect (meaning this test can be used) for the duration of the COVID-19 declaration under Section 564(b)(1) of the Act, 21 U.S.C. section 360bbb-3(b)(1), unless the authorization is terminated or revoked sooner. Performed at [facility], [address] [place], [place] [address]   Surgical PCR screen     Status: None   Collection Time: 06/28/19 11:20 PM   Specimen: Nasal Mucosa; Nasal Swab  Result Value Ref Range Status   MRSA, PCR NEGATIVE NEGATIVE Final   Staphylococcus aureus NEGATIVE NEGATIVE Final    Comment: (NOTE) The Xpert SA Assay (FDA approved for NASAL specimens in patients 46 years of age and older), is one component of Malacki Mcphearson comprehensive surveillance program. It is not intended to diagnose infection nor to guide or monitor treatment. Performed at [facility], [place] [address] [place], [place] [address]      Labs: Basic Metabolic Panel: Recent Labs  Lab 06/28/19 1631 06/29/19 0449 06/30/19 0423  NA 137 137 137  K 3.8 4.0 4.9  CL 101 103 106  CO2 25 27 28   GLUCOSE 101* 84 106*  BUN 21 22 26*  CREATININE 1.41* 1.25* 1.20  CALCIUM 8.1* 8.1* 8.0*   Liver Function Tests: Recent Labs  Lab 06/28/19 1631  AST 22  ALT 26  ALKPHOS 123  BILITOT 0.5  PROT 5.8*  ALBUMIN 2.8*   No results for input(s): LIPASE, AMYLASE in the last 168 hours. No results for input(s): AMMONIA in the last 168 hours. CBC: Recent Labs  Lab 06/28/19 1631 06/29/19 0449  06/30/19 0423 06/30/19 1355  WBC 7.8 11.3* 11.3* 12.5*  NEUTROABS 6.2  --   --   --   HGB 10.5* 10.6* 7.6* 7.9*  HCT 33.0* 33.5* 23.8* 24.8*  MCV 97.1 99.4 98.8 98.4  PLT 103* 113* 89* 99*   Cardiac Enzymes: No results for input(s): CKTOTAL, CKMB, CKMBINDEX, TROPONINI in the last 168 hours. BNP: BNP (last 3 results) No results for input(s): BNP in the last 8760 hours.  ProBNP (last 3 results) No results for input(s): PROBNP in the last 8760 hours.  CBG: No results for input(s): GLUCAP in the last 168 hours.     Signed:  Fayrene Helper MD.  [facility] 06/30/2019, 4:21 PM

## 2019-06-30 NOTE — Evaluation (Signed)
Physical Therapy Evaluation Patient Details Name: Travis Woodward. MRN: [id number] DOB: February 17, 1941 Today's Date: 06/30/2019   History of Present Illness  Travis Woodward. is a 78 y.o. male who suffered L transcervical femur fx and is s/p L THA (06/29/19) NGE:XBMWU III chronic kidney disease, coronary artery disease, hyperlipidemia, hypertension, BPH, history of postop acute urinary retention requiring Foley catheter  Clinical Impression  Pt admitted with above diagnosis. Pt currently with functional limitations due to the deficits listed below (see PT Problem List).  Pt will benefit from skilled PT to increase their independence and safety with mobility to allow discharge to the venue listed below.  Pt able to ambulate around the bed with RW and min /guard with cues for sequencing and maintaining hip precautions.  Anticipate that he should be able to progress while in acute care to be able to d/c home with family support and HHPT.     Follow Up Recommendations Home health PT;Supervision for mobility/OOB    Equipment Recommendations  None recommended by PT    Recommendations for Other Services       Precautions / Restrictions Precautions Precautions: Posterior Hip Restrictions Weight Bearing Restrictions: Yes LLE Weight Bearing: Weight bearing as tolerated      Mobility  Bed Mobility Overal bed mobility: Needs Assistance Bed Mobility: Supine to Sit     Supine to sit: Min assist     General bed mobility comments: for LLE   Transfers Overall transfer level: Needs assistance Equipment used: Rolling walker (2 wheeled) Transfers: Sit to/from Stand Sit to Stand: Min assist         General transfer comment: light assistance to stand and steady. Cues for safe technique to maintain hip precautions.  Ambulation/Gait Ambulation/Gait assistance: Min guard Gait Distance (Feet): 15 Feet Assistive device: Rolling walker (2 wheeled) Gait Pattern/deviations: Decreased step length -  right;Decreased stance time - left;Antalgic     General Gait Details: Heavy use of UE for L LE pain management, but did c/o UE fatigue.  Cues for sequencing and safe turning.  Stairs            Wheelchair Mobility    Modified Rankin (Stroke Patients Only)       Balance Overall balance assessment: History of Falls                                           Pertinent Vitals/Pain Pain Assessment: Faces Faces Pain Scale: Hurts even more Pain Location: L hip with WB Pain Descriptors / Indicators: Grimacing;Operative site guarding Pain Intervention(s): Limited activity within patient's tolerance;Monitored during session;Premedicated before session;Repositioned;Ice applied    Home Living Family/patient expects to be discharged to:: Private residence Living Arrangements: Spouse/significant other;Other relatives Available Help at Discharge: Family Type of Home: House Home Access: Stairs to enter Entrance Stairs-Rails: Can reach both Entrance Stairs-Number of Steps: 2 + landing   Home Equipment: Bedside commode;Walker - 2 wheels;Cane - single point;Manufacturing systems engineer) Additional Comments: and wife's ex-husband    Prior Function Level of Independence: Independent with assistive device(s)         Comments: walking stick     Hand Dominance        Extremity/Trunk Assessment   Upper Extremity Assessment Upper Extremity Assessment: Defer to OT evaluation    Lower Extremity Assessment Lower Extremity Assessment: LLE deficits/detail;Generalized weakness LLE: Unable to fully assess due to pain  Communication      Cognition Arousal/Alertness: Awake/alert Behavior During Therapy: WFL for tasks assessed/performed Overall Cognitive Status: Within Functional Limits for tasks assessed                                 General Comments: pt talks a lot; need to get his attention; needs reinforcement with posterior THPs       General Comments      Exercises     Assessment/Plan    PT Assessment Patient needs continued PT services  PT Problem List Decreased strength;Decreased range of motion;Decreased activity tolerance;Decreased balance;Decreased mobility;Decreased knowledge of use of DME;Decreased knowledge of precautions       PT Treatment Interventions DME instruction;Gait training;Stair training;Functional mobility training;Balance training;Therapeutic exercise;Therapeutic activities;Patient/family education    PT Goals (Current goals can be found in the Care Plan section)  Acute Rehab PT Goals Patient Stated Goal: return to independence PT Goal Formulation: With patient Time For Goal Achievement: 07/07/19 Potential to Achieve Goals: Good    Frequency Min 5X/week   Barriers to discharge        Co-evaluation PT/OT/SLP Co-Evaluation/Treatment: Yes Reason for Co-Treatment: For patient/therapist safety PT goals addressed during session: Mobility/safety with mobility OT goals addressed during session: ADL's and self-care       AM-PAC PT "6 Clicks" Mobility  Outcome Measure Help needed turning from your back to your side while in a flat bed without using bedrails?: A Little Help needed moving from lying on your back to sitting on the side of a flat bed without using bedrails?: A Little Help needed moving to and from a bed to a chair (including a wheelchair)?: A Little Help needed standing up from a chair using your arms (e.g., wheelchair or bedside chair)?: A Little Help needed to walk in hospital room?: A Little Help needed climbing 3-5 steps with a railing? : A Little 6 Click Score: 18    End of Session Equipment Utilized During Treatment: Gait belt Activity Tolerance: Patient tolerated treatment well Patient left: in chair;with call bell/phone within reach;with chair alarm set;Other (comment)(OT and MD in room) Nurse Communication: Mobility status PT Visit Diagnosis: Muscle weakness  (generalized) (M62.81);Difficulty in walking, not elsewhere classified (R26.2)    Time: 1000-1023 PT Time Calculation (min) (ACUTE ONLY): 23 min   Charges:   PT Evaluation $PT Eval Low Complexity: 1 Low          Seven Dollens L. Tamala Julian, [place] Pager [phone] 06/30/2019   Galen Manila 06/30/2019, 10:43 AM

## 2019-06-30 NOTE — Progress Notes (Signed)
Subjective: 1 Day Post-Op Procedure(s) (LRB): ARTHROPLASTY BIPOLAR HIP (HEMIARTHROPLASTY) (Left) Patient reports pain as mild.    Objective: Vital signs in last 24 hours: Temp:  [97.5 F (36.4 C)-98.1 F (36.7 C)] 98.1 F (36.7 C) (07/03 1000) Pulse Rate:  [66-103] 83 (07/03 1000) Resp:  [11-18] 14 (07/03 1000) BP: (89-118)/(54-62) 91/62 (07/03 1000) SpO2:  [96 %-100 %] 100 % (07/03 1000)  Intake/Output from previous day: 07/02 0701 - 07/03 0700 In: 3677.1 [I.V.:2977.1; IV Piggyback:700] Out: 1550 [Urine:1250; Blood:300] Intake/Output this shift: Total I/O In: -  Out: 300 [Urine:300]  Recent Labs    06/28/19 1631 06/29/19 0449 06/30/19 0423 06/30/19 1355  HGB 10.5* 10.6* 7.6* 7.9*   Recent Labs    06/30/19 0423 06/30/19 1355  WBC 11.3* 12.5*  RBC 2.41* 2.52*  HCT 23.8* 24.8*  PLT 89* 99*   Recent Labs    06/29/19 0449 06/30/19 0423  NA 137 137  K 4.0 4.9  CL 103 106  CO2 27 28  BUN 22 26*  CREATININE 1.25* 1.20  GLUCOSE 84 106*  CALCIUM 8.1* 8.0*   No results for input(s): LABPT, INR in the last 72 hours.  Sensation intact distally Intact pulses distally Dorsiflexion/Plantar flexion intact Incision: dressing C/D/I   Assessment/Plan: 1 Day Post-Op Procedure(s) (LRB): ARTHROPLASTY BIPOLAR HIP (HEMIARTHROPLASTY) (Left) Up with therapy WBAT LLE May d/c home today has help with wife and friend, has plenty of pain meds at home as he has oxy for chronic pain, plan on lovenox as written until 07/27/19 will need teaching prior to d/c home   Chriss Czar 06/30/2019, 3:43 PM

## 2019-06-30 NOTE — Plan of Care (Signed)
  Problem: Education: Goal: Knowledge of General Education information will improve Description: Including pain rating scale, medication(s)/side effects and non-pharmacologic comfort measures Outcome: Completed/Met

## 2019-07-03 ENCOUNTER — Encounter (HOSPITAL_COMMUNITY): Payer: Self-pay | Admitting: Orthopedic Surgery

## 2019-07-04 MED FILL — Hydromorphone HCl Inj 2 MG/ML: INTRAMUSCULAR | Qty: 1 | Status: AC

## 2019-07-11 NOTE — Anesthesia Postprocedure Evaluation (Signed)
Anesthesia Post Note  Patient: Jeryl Umholtz.  Procedure(s) Performed: ARTHROPLASTY BIPOLAR HIP (HEMIARTHROPLASTY) (Left Hip)     Patient location during evaluation: PACU Anesthesia Type: General Level of consciousness: awake and alert Pain management: pain level controlled Vital Signs Assessment: post-procedure vital signs reviewed and stable Respiratory status: spontaneous breathing, nonlabored ventilation, respiratory function stable and patient connected to nasal cannula oxygen Cardiovascular status: blood pressure returned to baseline and stable Postop Assessment: no apparent nausea or vomiting Anesthetic complications: no    Last Vitals:  Vitals:   06/30/19 0422 06/30/19 1000  BP: (!) 118/56 91/62  Pulse: 66 83  Resp: 18 14  Temp: 36.6 C 36.7 C  SpO2: 99% 100%    Last Pain:  Vitals:   06/30/19 1000  TempSrc: Oral  PainSc:                  [place]

## 2019-07-12 DIAGNOSIS — S72002D Fracture of unspecified part of neck of left femur, subsequent encounter for closed fracture with routine healing: Secondary | ICD-10-CM | POA: Diagnosis not present

## 2019-07-18 DIAGNOSIS — E538 Deficiency of other specified B group vitamins: Secondary | ICD-10-CM | POA: Diagnosis not present

## 2019-07-18 DIAGNOSIS — Z6820 Body mass index (BMI) 20.0-20.9, adult: Secondary | ICD-10-CM | POA: Diagnosis not present

## 2019-07-18 DIAGNOSIS — M1991 Primary osteoarthritis, unspecified site: Secondary | ICD-10-CM | POA: Diagnosis not present

## 2019-07-18 DIAGNOSIS — N183 Chronic kidney disease, stage 3 (moderate): Secondary | ICD-10-CM | POA: Diagnosis not present

## 2019-07-18 DIAGNOSIS — G894 Chronic pain syndrome: Secondary | ICD-10-CM | POA: Diagnosis not present

## 2019-08-24 DIAGNOSIS — D510 Vitamin B12 deficiency anemia due to intrinsic factor deficiency: Secondary | ICD-10-CM | POA: Diagnosis not present

## 2019-08-24 DIAGNOSIS — M1991 Primary osteoarthritis, unspecified site: Secondary | ICD-10-CM | POA: Diagnosis not present

## 2019-08-24 DIAGNOSIS — I10 Essential (primary) hypertension: Secondary | ICD-10-CM | POA: Diagnosis not present

## 2019-08-24 DIAGNOSIS — Z681 Body mass index (BMI) 19 or less, adult: Secondary | ICD-10-CM | POA: Diagnosis not present

## 2019-08-24 DIAGNOSIS — R634 Abnormal weight loss: Secondary | ICD-10-CM | POA: Diagnosis not present

## 2019-08-24 DIAGNOSIS — G894 Chronic pain syndrome: Secondary | ICD-10-CM | POA: Diagnosis not present

## 2019-08-28 DIAGNOSIS — Z79891 Long term (current) use of opiate analgesic: Secondary | ICD-10-CM | POA: Diagnosis not present

## 2019-08-28 DIAGNOSIS — R634 Abnormal weight loss: Secondary | ICD-10-CM | POA: Diagnosis not present

## 2019-09-07 DIAGNOSIS — S32029A Unspecified fracture of second lumbar vertebra, initial encounter for closed fracture: Secondary | ICD-10-CM | POA: Diagnosis not present

## 2019-09-07 DIAGNOSIS — C801 Malignant (primary) neoplasm, unspecified: Secondary | ICD-10-CM | POA: Diagnosis not present

## 2019-09-07 DIAGNOSIS — K6289 Other specified diseases of anus and rectum: Secondary | ICD-10-CM | POA: Diagnosis not present

## 2019-09-07 DIAGNOSIS — I10 Essential (primary) hypertension: Secondary | ICD-10-CM | POA: Diagnosis not present

## 2019-09-07 DIAGNOSIS — I517 Cardiomegaly: Secondary | ICD-10-CM | POA: Diagnosis not present

## 2019-09-07 DIAGNOSIS — Z96642 Presence of left artificial hip joint: Secondary | ICD-10-CM | POA: Diagnosis not present

## 2019-09-07 DIAGNOSIS — R918 Other nonspecific abnormal finding of lung field: Secondary | ICD-10-CM | POA: Diagnosis not present

## 2019-09-07 DIAGNOSIS — K8689 Other specified diseases of pancreas: Secondary | ICD-10-CM | POA: Diagnosis not present

## 2019-09-07 DIAGNOSIS — R634 Abnormal weight loss: Secondary | ICD-10-CM | POA: Diagnosis not present

## 2019-09-07 DIAGNOSIS — S32039A Unspecified fracture of third lumbar vertebra, initial encounter for closed fracture: Secondary | ICD-10-CM | POA: Diagnosis not present

## 2019-09-07 DIAGNOSIS — I348 Other nonrheumatic mitral valve disorders: Secondary | ICD-10-CM | POA: Diagnosis not present

## 2019-09-07 DIAGNOSIS — S22089A Unspecified fracture of T11-T12 vertebra, initial encounter for closed fracture: Secondary | ICD-10-CM | POA: Diagnosis not present

## 2019-09-21 DIAGNOSIS — M1991 Primary osteoarthritis, unspecified site: Secondary | ICD-10-CM | POA: Diagnosis not present

## 2019-09-21 DIAGNOSIS — G894 Chronic pain syndrome: Secondary | ICD-10-CM | POA: Diagnosis not present

## 2019-09-21 DIAGNOSIS — R634 Abnormal weight loss: Secondary | ICD-10-CM | POA: Diagnosis not present

## 2019-09-21 DIAGNOSIS — Z681 Body mass index (BMI) 19 or less, adult: Secondary | ICD-10-CM | POA: Diagnosis not present

## 2019-09-29 ENCOUNTER — Telehealth: Payer: Self-pay | Admitting: Gastroenterology

## 2019-09-29 NOTE — Telephone Encounter (Signed)
Dr. Tarri Glenn, you had previously reviewed pt's records in February 2020.  Pt no showed his appointment then.  Pt is now being referred for a colonoscopy--colon mass.  Colonoscopy report from 2011 by Dr. Laural Golden are in [place] for review.  Please advise scheduling.

## 2019-09-29 NOTE — Telephone Encounter (Signed)
Does pt need an OV appt prior to colonoscopy? If so you do not have any appts available for OV. Please advise.

## 2019-09-29 NOTE — Telephone Encounter (Signed)
Please offer the patient a timely appointment next week. Thank you.

## 2019-09-30 NOTE — Telephone Encounter (Signed)
I could see him 10/02/19 at 1pm for urgent work in. Please obtain any recent labs, imaging, or records that support a diagnosis of colon cancer prior to that time. Thank you.

## 2019-10-02 ENCOUNTER — Encounter: Payer: Self-pay | Admitting: Emergency Medicine

## 2019-10-02 ENCOUNTER — Ambulatory Visit: Payer: Medicare Other | Admitting: Gastroenterology

## 2019-10-02 NOTE — Telephone Encounter (Signed)
Dr. Tarri Glenn, this patient is unable to come today. He is cared for by his daughter and she is working today. She is available any other day this week but could not bring her father for an OV being notified same day. Please advise another day this week the patient could be worked in to be seen.

## 2019-10-02 NOTE — Telephone Encounter (Signed)
Patient is unable to do a virtual visit. The patient has been scheduled with Ellouise Newer PA on 10/8 at 2:00 pm. Patient's daughter Richrd Prime aware.

## 2019-10-02 NOTE — Telephone Encounter (Signed)
It was an overbook for today. The clinic schedule is very full this week. Could it be a virtual appointment today?

## 2019-10-04 ENCOUNTER — Ambulatory Visit (INDEPENDENT_AMBULATORY_CARE_PROVIDER_SITE_OTHER): Payer: Medicare Other | Admitting: Endocrinology

## 2019-10-04 ENCOUNTER — Encounter: Payer: Self-pay | Admitting: Endocrinology

## 2019-10-04 ENCOUNTER — Other Ambulatory Visit: Payer: Self-pay

## 2019-10-04 DIAGNOSIS — E270 Other adrenocortical overactivity: Secondary | ICD-10-CM | POA: Insufficient documentation

## 2019-10-04 DIAGNOSIS — R7989 Other specified abnormal findings of blood chemistry: Secondary | ICD-10-CM | POA: Diagnosis not present

## 2019-10-04 DIAGNOSIS — M810 Age-related osteoporosis without current pathological fracture: Secondary | ICD-10-CM | POA: Diagnosis not present

## 2019-10-04 MED ORDER — DEXAMETHASONE 1 MG PO TABS: ORAL_TABLET | ORAL | 0 refills | Status: DC

## 2019-10-04 NOTE — Patient Instructions (Addendum)
Please check a 24-HR urine collection.  Please complete this prior to this next test: You should do a "dexamethasone suppression test."  For this, you would take dexamethasone 1 mg at 10 pm (I have sent a prescription to your pharmacy), then come in for a "cortisol" blood test the next morning before 9 am.  You do not need to be fasting for this test.
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
## 2019-10-05 ENCOUNTER — Ambulatory Visit (INDEPENDENT_AMBULATORY_CARE_PROVIDER_SITE_OTHER): Payer: Medicare Other | Admitting: Physician Assistant

## 2019-10-05 ENCOUNTER — Telehealth: Payer: Self-pay

## 2019-10-05 ENCOUNTER — Encounter: Payer: Self-pay | Admitting: Physician Assistant

## 2019-10-05 VITALS — BP 104/60 | HR 80 | Temp 98.4°F | Ht 70.25 in | Wt 144.5 lb

## 2019-10-05 DIAGNOSIS — Z7901 Long term (current) use of anticoagulants: Secondary | ICD-10-CM

## 2019-10-05 DIAGNOSIS — R935 Abnormal findings on diagnostic imaging of other abdominal regions, including retroperitoneum: Secondary | ICD-10-CM | POA: Diagnosis not present

## 2019-10-05 DIAGNOSIS — R634 Abnormal weight loss: Secondary | ICD-10-CM

## 2019-10-05 DIAGNOSIS — Z86010 Personal history of colonic polyps: Secondary | ICD-10-CM

## 2019-10-05 MED ORDER — NA SULFATE-K SULFATE-MG SULF 17.5-3.13-1.6 GM/177ML PO SOLN: 1.0000 | Freq: Once | ORAL | 0 refills | Status: AC

## 2019-10-05 NOTE — Progress Notes (Signed)
Chief Complaint: Rectal mass  HPI:    [name] is a 78 year old Caucasian male with a past medical history of CKD and others listed below including CAD on Plavix (echo 06/29/2019 with LVEF 60-65%) no aortic stenosis., assigned to Dr. Tarri Glenn, who was referred to me by Redmond School, MD for a complaint of rectal mass.      09/07/2019 CT of the abdomen and pelvis with a masslike mural thickening in the rectum, follow-up with nonemergent colonoscopy is recommended.  Multiple pulmonary nodules.  Cardiomegaly with left atrial dilation.  Calcifications of the aortic valve and mitral valve.  Pancreatic atrophy.    09/24/2019 CBC normal.  9/31/20 CMP with platelets low at 121, alk phos elevated 164, albumin low at 2.9, otherwise normal.    Today, the patient presents to clinic accompanied by his daughter who does assist with history.  He explains that he is aware of abnormal CT but he had a "huge bowel movement" the night before they did the CT which caused him "a lot of irritation back there" and he believes this is why they saw the rectal thickening.  Does tell me though that he has had a weight loss of around 90 pounds over the past 4 to 5 months which is unexplained.  Apparently he is following with endocrinology for further labs but they have not really "started their work-up yet".    Denies fever, chills, nausea, vomiting, anorexia, heartburn, reflux, change in bowel habits or blood in his stool.  Past Medical History:  Diagnosis Date  . Chronic kidney disease    stage 3  . Complication of anesthesia    difficulty urinating   . Coronary artery disease   . Enlarged prostate   . Hyperlipidemia   . Hypertension     Past Surgical History:  Procedure Laterality Date  . acd fusion & plating    . CARDIAC CATHETERIZATION  2005  . CHOLECYSTECTOMY N/A 07/19/2017   Procedure: LAPAROSCOPIC CHOLECYSTECTOMY;  Surgeon: Aviva Signs, MD;  Location: [facility];  Service: General;  Laterality: N/A;  . CORONARY  STENT PLACEMENT  2005  . HEMORRHOID SURGERY    . HIP ARTHROPLASTY Left 06/29/2019   Procedure: ARTHROPLASTY BIPOLAR HIP (HEMIARTHROPLASTY);  Surgeon: Marchia Bond, MD;  Location: [facility];  Service: Orthopedics;  Laterality: Left;  . LAPAROSCOPIC APPENDECTOMY  04/17/2012   Procedure: APPENDECTOMY LAPAROSCOPIC;  Surgeon: Jamesetta So, MD;  Location: [facility];  Service: General;  Laterality: N/A;  . TONSILLECTOMY      Current Outpatient Medications  Medication Sig Dispense Refill  . aspirin EC 81 MG tablet Take by mouth.    . cholecalciferol (VITAMIN D) 1000 units tablet Take 1,000 Units by mouth daily.    . clopidogrel (PLAVIX) 75 MG tablet Take 1 tablet by mouth daily.    . Cyanocobalamin (VITAMIN B-12 IJ) Inject 1 mL as directed every 30 (thirty) days.     Marland Kitchen oxyCODONE (ROXICODONE) 15 MG immediate release tablet Take 1 tablet (15 mg total) by mouth every 4 (four) hours as needed. He takes 3-4 times a day as needed 30 tablet 0  . dexamethasone (DECADRON) 1 MG tablet Take at 9-10 PM< the night before blood test (Patient not taking: Reported on 10/05/2019) 1 tablet 0  . nitroGLYCERIN (NITROSTAT) 0.4 MG SL tablet Place 0.4 mg under the tongue every 5 (five) minutes as needed.      No current facility-administered medications for this visit.     Allergies as of 10/05/2019 -  Review Complete 10/05/2019  Allergen Reaction Noted  . Crestor [rosuvastatin]  01/02/2015  . Paroxetine Rash     Family History  Problem Relation Age of Onset  . Heart attack Mother   . Tuberculosis Maternal Grandfather   . Adrenal disorder Neg Hx     Social History   Socioeconomic History  . Marital status: Married    Spouse name: Not on file  . Number of children: Not on file  . Years of education: Not on file  . Highest education level: Not on file  Occupational History  . Not on file  Social Needs  . Financial resource strain: Not on file  . Food insecurity    Worry: Not on file    Inability: Not on  file  . Transportation needs    Medical: Not on file    Non-medical: Not on file  Tobacco Use  . Smoking status: Former Smoker    Quit date: 07/16/1972    Years since quitting: 47.2  . Smokeless tobacco: Never Used  Substance and Sexual Activity  . Alcohol use: No  . Drug use: No  . Sexual activity: Never  Lifestyle  . Physical activity    Days per week: Not on file    Minutes per session: Not on file  . Stress: Not on file  Relationships  . Social Herbalist on phone: Not on file    Gets together: Not on file    Attends religious service: Not on file    Active member of club or organization: Not on file    Attends meetings of clubs or organizations: Not on file    Relationship status: Not on file  . Intimate partner violence    Fear of current or ex partner: Not on file    Emotionally abused: Not on file    Physically abused: Not on file    Forced sexual activity: Not on file  Other Topics Concern  . Not on file  Social History Narrative  . Not on file    Review of Systems:    Constitutional: No fever or chills Skin: No rash  Cardiovascular: No chest pain Respiratory: No SOB Gastrointestinal: See HPI and otherwise negative Genitourinary: No dysuria  Neurological: No headache Musculoskeletal: No new muscle or joint pain Hematologic: No bleeding  Psychiatric: No history of depression or anxiety   Physical Exam:  Vital signs: BP 104/60 (BP Location: Left Arm, Patient Position: Sitting, Cuff Size: Normal)   Pulse 80   Temp 98.4 F (36.9 C)   Ht 5' 10.25" (1.784 m) Comment: height measured without shoes  Wt 144 lb 8 oz (65.5 kg)   BMI 20.59 kg/m   Constitutional:   Pleasant thin appearing Caucasian male appears to be in NAD, Well developed, Well nourished, alert and cooperative Head:  Normocephalic and atraumatic. Eyes:   PEERL, EOMI. No icterus. Conjunctiva pink. Ears:  Normal auditory acuity. Neck:  Supple Throat: Oral cavity and pharynx without  inflammation, swelling or lesion.  Respiratory: Respirations even and unlabored. Lungs clear to auscultation bilaterally.   No wheezes, crackles, or rhonchi.  Cardiovascular: Normal S1, S2. No MRG. Regular rate and rhythm. No peripheral edema, cyanosis or pallor.  Gastrointestinal:  Soft, nondistended, nontender. No rebound or guarding. Normal bowel sounds. No appreciable masses or hepatomegaly. Rectal:  Not performed.  Msk:  Symmetrical without gross deformities. Without edema, no deformity or joint abnormality.  Neurologic:  Alert and  oriented x4;  grossly normal  neurologically.  Skin:   Dry and intact without significant lesions or rashes. Psychiatric: Demonstrates good judgement and reason without abnormal affect or behaviors.  See HPI for recent labs  Assessment: 1.  Abnormal CT of the pelvis: Showing thickening in his rectum, patient blames this on a large bowel movement he had the night before which caused a lot of irritation; must consider mass vs constipation vs stercoral ulcer vs other 2.  Weight loss: Of 90 pounds in the past 4 to 5 months, apparently he has high cortisol levels in his body per endocrinology, they are working this up further; consider cancer versus other 3.  Chronic anticoagulation: For CAD on Plavix 4.  History of colon polyps: Describes last colonoscopy in 2011, never followed up  Plan: 1.  Due to significant weight loss of 90 pounds in the past 4 to 5 months recommend the patient have an EGD and colonoscopy.  Did discuss risks, benefits, limitations and alternatives and the patient agrees to proceed.  He has previously been assigned to Dr. Tarri Glenn. 2.  Patient was advised to hold his Plavix for 5 days prior to time of procedure.  We will contact his prescribing physician to ensure this is acceptable for him. 3.  Patient to follow in clinic per recommendations after procedure as above.  Ellouise Newer, PA-C [facility] 10/05/2019, 2:03 PM  Cc:  Redmond School, MD

## 2019-10-05 NOTE — Patient Instructions (Signed)

## 2019-10-05 NOTE — Telephone Encounter (Signed)
[facility] Pre-operative Risk Assessment     Request for surgical clearance:     Endoscopy Procedure  What type of surgery is being performed?     Colon/EGD  When is this surgery scheduled?     10/19/19  What type of clearance is required ?   Pharmacy  Are there any medications that need to be held prior to surgery and how long? Plavix x 5 days  Practice name and name of physician performing surgery?      [facility]  What is your office phone and fax number?      Phone- [phone]  [phone]  Anesthesia type (None, local, MAC, general) ?       MAC

## 2019-10-05 NOTE — Telephone Encounter (Signed)
   Primary Cardiologist:Jonathan Gwenlyn Found, MD  Chart reviewed as part of pre-operative protocol coverage. Because of Travis TABOADA Jr.'s past medical history and time since last visit, he/she will require a follow-up visit in order to better assess preoperative cardiovascular risk.  This patient has not been seen by our service since 2017.   Pre-op covering staff: - Please schedule appointment and call patient to inform them. - Please contact requesting surgeon's office via preferred method (i.e, phone, fax) to inform them of need for appointment prior to surgery.  If applicable, this message will also be routed to pharmacy pool and/or primary cardiologist for input on holding anticoagulant/antiplatelet agent as requested below so that this information is available at time of patient's appointment.   Kathyrn Drown, NP  10/05/2019, 3:22 PM

## 2019-10-05 NOTE — Telephone Encounter (Signed)
Pt has appt 10/10/19 with Dr. Gwenlyn Found for surgery clearance. I will route information to Dr. Gwenlyn Found for appt and remove from call back pool.

## 2019-10-06 NOTE — Progress Notes (Signed)
Reviewed. I agree with documentation including the assessment and plan. Will proceed with colonoscopy and EGD after a Plavix washout.   Rozalyn Osland L. Tarri Glenn, MD, MPH

## 2019-10-09 ENCOUNTER — Institutional Professional Consult (permissible substitution): Payer: Medicare Other | Admitting: Pulmonary Disease

## 2019-10-09 NOTE — Progress Notes (Deleted)
    Subjective:   PATIENT ID: Travis Woodward. GENDER: male DOB: Mar 24, 1941, MRN: [id number]   HPI  No chief complaint on file.   Reason for Visit: ***Follow-up ***New consult for ***  *** Social History:  Environmental exposures: ***  I have personally reviewed patient's past medical/family/social history, allergies, current medications.***  Past Medical History:  Diagnosis Date  . Chronic kidney disease    stage 3  . Colon polyps   . Complication of anesthesia    difficulty urinating   . Coronary artery disease   . Enlarged prostate   . Hyperlipidemia   . Hypertension      Family History  Problem Relation Age of Onset  . Heart attack Mother   . Tuberculosis Maternal Grandfather   . Adrenal disorder Neg Hx      Social History   Occupational History  . Occupation: retired  Tobacco Use  . Smoking status: Former Smoker    Quit date: 07/16/1972    Years since quitting: 47.2  . Smokeless tobacco: Never Used  Substance and Sexual Activity  . Alcohol use: No  . Drug use: No  . Sexual activity: Never    Allergies  Allergen Reactions  . Crestor [Rosuvastatin]   . Paroxetine Rash     Outpatient Medications Prior to Visit  Medication Sig Dispense Refill  . aspirin EC 81 MG tablet Take by mouth.    . cholecalciferol (VITAMIN D) 1000 units tablet Take 1,000 Units by mouth daily.    . clopidogrel (PLAVIX) 75 MG tablet Take 1 tablet by mouth daily.    . Cyanocobalamin (VITAMIN B-12 IJ) Inject 1 mL as directed every 30 (thirty) days.     Marland Kitchen dexamethasone (DECADRON) 1 MG tablet Take at 9-10 PM< the night before blood test (Patient not taking: Reported on 10/05/2019) 1 tablet 0  . nitroGLYCERIN (NITROSTAT) 0.4 MG SL tablet Place 0.4 mg under the tongue every 5 (five) minutes as needed.     Marland Kitchen oxyCODONE (ROXICODONE) 15 MG immediate release tablet Take 1 tablet (15 mg total) by mouth every 4 (four) hours as needed. He takes 3-4 times a day as needed 30 tablet 0   No  facility-administered medications prior to visit.     ROS   Objective:  There were no vitals filed for this visit.    Physical Exam: General: Well-appearing, no acute distress HENT: [place], AT, OP clear, MMM Eyes: EOMI, no scleral icterus Lymph: no cervical lymphadenopathy Respiratory: Clear to auscultation bilaterally.  No crackles, wheezing or rales Cardiovascular: RRR, -M/R/G, no JVD GI: BS+, soft, nontender Extremities:-Edema,-tenderness Neuro: AAO x4, CNII-XII grossly intact Skin: Intact, no rashes or bruising Psych: Normal mood, normal affect  Data Reviewed:  Imaging:  PFT:  Labs:  Imaging, labs and tests noted above have been reviewed independently by me.    Assessment & Plan:   Discussion: ***  Health Maintenance Pneumonia*** Influenza*** CT Lung Screen***  No orders of the defined types were placed in this encounter. No orders of the defined types were placed in this encounter.   No follow-ups on file.  Mather, MD [facility] 10/09/2019 1:34 PM  Office Number [phone]

## 2019-10-10 ENCOUNTER — Other Ambulatory Visit: Payer: Self-pay

## 2019-10-10 ENCOUNTER — Ambulatory Visit (INDEPENDENT_AMBULATORY_CARE_PROVIDER_SITE_OTHER): Payer: Medicare Other | Admitting: Cardiovascular Disease

## 2019-10-10 ENCOUNTER — Encounter: Payer: Self-pay | Admitting: Cardiovascular Disease

## 2019-10-10 DIAGNOSIS — I2583 Coronary atherosclerosis due to lipid rich plaque: Secondary | ICD-10-CM | POA: Diagnosis not present

## 2019-10-10 DIAGNOSIS — I2510 Atherosclerotic heart disease of native coronary artery without angina pectoris: Secondary | ICD-10-CM | POA: Diagnosis not present

## 2019-10-10 DIAGNOSIS — I10 Essential (primary) hypertension: Secondary | ICD-10-CM | POA: Diagnosis not present

## 2019-10-10 DIAGNOSIS — E782 Mixed hyperlipidemia: Secondary | ICD-10-CM

## 2019-10-10 NOTE — Assessment & Plan Note (Signed)
History of hyperlipidemia not on statin therapy with lipid profile performed 08/28/2019 revealing total cholesterol 93, LDL 32 and HDL 46
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
## 2019-10-10 NOTE — Assessment & Plan Note (Signed)
History of CAD status post Cypher drug-eluting stenting of his LAD by Dr. Claiborne Billings 09/03/2004.  Last Myoview performed 10/04/2009 was nonischemic.  He denies chest pain or shortness of breath.

## 2019-10-10 NOTE — Assessment & Plan Note (Signed)
History of essential potential blood pressure measured today at 121/68.  He is not on any antihypertensive medications.

## 2019-10-10 NOTE — Patient Instructions (Signed)
Medication Instructions:  none If you need a refill on your cardiac medications before your next appointment, please call your pharmacy.   Lab work: none If you have labs (blood work) drawn today and your tests are completely normal, you will receive your results only by: . MyChart Message (if you have MyChart) OR . A paper copy in the mail If you have any lab test that is abnormal or we need to change your treatment, we will call you to review the results.  Testing/Procedures: none  Follow-Up: At [facility], you and your health needs are our priority.  As part of our continuing mission to provide you with exceptional heart care, we have created designated Provider Care Teams.  These Care Teams include your primary Cardiologist (physician) and Advanced Practice Providers (APPs -  Physician Assistants and Nurse Practitioners) who all work together to provide you with the care you need, when you need it. You will need a follow up appointment in 12 months with Dr. Jonathan Berry.  Please call our office 2 months in advance to schedule this/each appointment.    

## 2019-10-12 ENCOUNTER — Ambulatory Visit (INDEPENDENT_AMBULATORY_CARE_PROVIDER_SITE_OTHER): Payer: Medicare Other

## 2019-10-12 ENCOUNTER — Other Ambulatory Visit: Payer: Self-pay

## 2019-10-12 ENCOUNTER — Other Ambulatory Visit (INDEPENDENT_AMBULATORY_CARE_PROVIDER_SITE_OTHER): Payer: Medicare Other

## 2019-10-12 DIAGNOSIS — Z23 Encounter for immunization: Secondary | ICD-10-CM

## 2019-10-12 DIAGNOSIS — R7989 Other specified abnormal findings of blood chemistry: Secondary | ICD-10-CM | POA: Diagnosis not present

## 2019-10-12 DIAGNOSIS — E270 Other adrenocortical overactivity: Secondary | ICD-10-CM

## 2019-10-12 LAB — CORTISOL: Cortisol, Plasma: 2.5 ug/dL

## 2019-10-12 NOTE — Progress Notes (Signed)
Per orders of Dr. Loanne Drilling injection of HD flu vaccine given IM in L deltoid today by A. Shantara Goosby, LPN . Denies any adverse reactions or discomfort.

## 2019-10-16 LAB — ACTH: C206 ACTH: 5 pg/mL — ABNORMAL LOW (ref 6–50)

## 2019-10-17 LAB — CORTISOL, URINE, 24 HOUR
24 Hour urine volume (VMAHVA): 1300 mL
Cortisol (Ur), Free: 16.2 mcg/24 h (ref 4.0–50.0)
RESULTS RECEIVED: 0.96 g/(24.h) (ref 0.50–2.15)

## 2019-10-18 ENCOUNTER — Telehealth: Payer: Self-pay | Admitting: Gastroenterology

## 2019-10-18 NOTE — Telephone Encounter (Signed)

## 2019-10-19 ENCOUNTER — Other Ambulatory Visit: Payer: Self-pay

## 2019-10-19 ENCOUNTER — Ambulatory Visit: Payer: Medicare Other | Admitting: Gastroenterology

## 2019-10-19 VITALS — BP 130/62 | HR 87 | Temp 98.1°F | Ht 70.25 in | Wt 144.0 lb

## 2019-10-19 NOTE — Progress Notes (Signed)
Pt did not see Dr Gwenlyn Found therefore was not informed about NOT PLAVIX FOR 5 DAYS. PROCEDURES CX TODAY.  RESCHEDULED AND SAMPLE OF SUPREP GIVEN TO PT.  previsit instructions reviewed again and new ones given to pt.

## 2019-10-23 ENCOUNTER — Telehealth: Payer: Self-pay

## 2019-10-23 NOTE — Telephone Encounter (Signed)
Covid-19 screening questions   Do you now or have you had a fever in the last 14 days? NO   Do you have any respiratory symptoms of shortness of breath or cough now or in the last 14 days? NO  Do you have any family members or close contacts with diagnosed or suspected Covid-19 in the past 14 days? NO  Have you been tested for Covid-19 and found to be positive? NO        

## 2019-10-24 ENCOUNTER — Telehealth: Payer: Self-pay | Admitting: Gastroenterology

## 2019-10-24 ENCOUNTER — Encounter: Payer: Medicare Other | Admitting: Gastroenterology

## 2019-10-24 NOTE — Telephone Encounter (Signed)
Jenkins Rouge- Per notes below this patient reschedule his endo/colon from this afternoon for 11/19. Just letting you know that he will need new prep sent for him and instructions and he is also on a blood thinner (Plavix) that will need to be addressed.

## 2019-10-24 NOTE — Telephone Encounter (Signed)
Desiree, I am anxious to wait so long for his endoscopic evaluation. Would you please see if there any options for earlier appointments? Thank you.

## 2019-10-24 NOTE — Telephone Encounter (Signed)
Hey Dr. Tarri Glenn-- This patient called and cancelled his procedure for today (endo/colon.) he's wife had been speaking with Anne Ng from admitting about him not having any bowel movements when taking the preps. The pt's wife just called back and asked to reschedule because he has not had any bowel movements and he is scared to get on the road to come here because he was scared he would have a bowel movement on the road.

## 2019-10-24 NOTE — Telephone Encounter (Signed)
Pt is scheduled for a 4:30 PM EGD/col and reported that second part of prep has not worked.

## 2019-10-24 NOTE — Telephone Encounter (Signed)
I discussed again with Anne Ng. We would like for him to come in this afternoon. We need to find out what's going on.

## 2019-10-24 NOTE — Telephone Encounter (Signed)
Pt's wife called [place] back and she reported pt drank suprep last pm and pt had several BMs - results were liquid with some bits of stool.  Pt drank his prep today and his wife reported that he has not had a bm after drinking prep.  Pt's wife asked what to do.  He is for Dr. Tarri Glenn at 4:30 [place].  I advised pt's wife to come on and we will try to do colonoscopy depending on how well the prep worked but we could at least do his EGD.  I encouraged her to bring him due to his sx.  Dr. Tarri Glenn was notified and agreed we will and ordered 1 fleet enema when he gets to admitting.  Maw    While I was charting this note pt's wife phoned the office to cancel ECL today.  I spoke with Dr. Tarri Glenn and she asked I call pt's wife and explain Dr. Tarri Glenn is very concerned about his sx and hoped that they would reconsider and come in for procedures today.  Called pt's wife at [phone].  She said that pt was afraid he may have BM in the car.  They rescheduled for 11-16-19 and Caryl Pina will send a note to CMA to get new instructions or pre-visit appointment and also to address blood thinner.  I advised pt's wife to get back on his blood thinner and check with prescribing MD if any questions about blood thinner.  Dr. Tarri Glenn was advised of their decision. maw

## 2019-10-25 ENCOUNTER — Other Ambulatory Visit: Payer: Self-pay

## 2019-10-25 ENCOUNTER — Encounter: Payer: Self-pay | Admitting: Pulmonary Disease

## 2019-10-25 ENCOUNTER — Ambulatory Visit (INDEPENDENT_AMBULATORY_CARE_PROVIDER_SITE_OTHER): Payer: Medicare Other | Admitting: Pulmonary Disease

## 2019-10-25 VITALS — BP 122/64 | HR 94 | Temp 98.0°F | Ht 70.25 in | Wt 146.2 lb

## 2019-10-25 DIAGNOSIS — Z8709 Personal history of other diseases of the respiratory system: Secondary | ICD-10-CM

## 2019-10-25 DIAGNOSIS — R911 Solitary pulmonary nodule: Secondary | ICD-10-CM

## 2019-10-25 DIAGNOSIS — Z87828 Personal history of other (healed) physical injury and trauma: Secondary | ICD-10-CM | POA: Diagnosis not present

## 2019-10-25 DIAGNOSIS — R634 Abnormal weight loss: Secondary | ICD-10-CM

## 2019-10-25 DIAGNOSIS — R222 Localized swelling, mass and lump, trunk: Secondary | ICD-10-CM | POA: Diagnosis not present

## 2019-10-25 NOTE — Telephone Encounter (Signed)
Left message on patients voicemail to call office.   
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
## 2019-10-25 NOTE — Patient Instructions (Addendum)
Thank you for visiting Dr. Valeta Harms at [facility]. Today we recommend the following:  Orders Placed This Encounter  Procedures  . CT Chest Wo Contrast   Return in about 5 months (around 03/24/2020).    Please do your part to reduce the spread of COVID-19.

## 2019-10-27 DIAGNOSIS — G894 Chronic pain syndrome: Secondary | ICD-10-CM | POA: Diagnosis not present

## 2019-10-27 DIAGNOSIS — E538 Deficiency of other specified B group vitamins: Secondary | ICD-10-CM | POA: Diagnosis not present

## 2019-10-27 DIAGNOSIS — Z0001 Encounter for general adult medical examination with abnormal findings: Secondary | ICD-10-CM | POA: Diagnosis not present

## 2019-10-27 DIAGNOSIS — M1991 Primary osteoarthritis, unspecified site: Secondary | ICD-10-CM | POA: Diagnosis not present

## 2019-10-27 DIAGNOSIS — K219 Gastro-esophageal reflux disease without esophagitis: Secondary | ICD-10-CM | POA: Diagnosis not present

## 2019-10-27 DIAGNOSIS — Z1389 Encounter for screening for other disorder: Secondary | ICD-10-CM | POA: Diagnosis not present

## 2019-10-27 DIAGNOSIS — I10 Essential (primary) hypertension: Secondary | ICD-10-CM | POA: Diagnosis not present

## 2019-10-27 DIAGNOSIS — Z681 Body mass index (BMI) 19 or less, adult: Secondary | ICD-10-CM | POA: Diagnosis not present

## 2019-10-27 NOTE — Telephone Encounter (Signed)
Left message on patients voicemail to call office.   

## 2019-10-30 NOTE — Telephone Encounter (Signed)
Noted! Thank you

## 2019-10-30 NOTE — Telephone Encounter (Signed)
Spoke with patient and tried to book him earlier than the 19th and he refused stating that he does not want to get off Plavix again right away. He is very afraid to stop his Plavix again, he states when he stopped it before he was having some chest pains and that made him feel nervous. I reiterated the importance of having this procedure and he agreed he just feels better waiting to be on Plavix longer.   Will reach out to cardiologist again to get Plavix clearance.

## 2019-10-30 NOTE — Telephone Encounter (Signed)
-----   Message from Lorretta Harp, MD sent at 10/30/2019  4:04 PM EST ----- Okay to hold the Plavix for his GI procedure ----- Message ----- From: Wyline Beady, CMA Sent: 10/30/2019   3:20 PM EST To: Lorretta Harp, MD  Hi Dr. Gwenlyn Found,   This patient was scheduled to have a colonsocopy with Dr. Tarri Glenn on 10-24-2019 and held Plavix 5 days prior to procedure. He did not come to procedure as scheduled because his wife felt that he was not adequately prepped so they canceled same day. We urged patient to restart Plavix that day. His procedure now has been rescheduled for 11-16-2019 and he is nervous to hold Plavix again. Is there any contraindication from a cardiac standpoint to hold Plavix again?  Thanks,   Tinnie Gens, CMA [place], MD

## 2019-11-03 ENCOUNTER — Telehealth: Payer: Self-pay | Admitting: Gastroenterology

## 2019-11-03 MED ORDER — NA SULFATE-K SULFATE-MG SULF 17.5-3.13-1.6 GM/177ML PO SOLN: 1.0000 | ORAL | 0 refills | Status: DC

## 2019-11-03 NOTE — Telephone Encounter (Signed)
Spoke with Mrs. Laufer and went over updated prep instructions, they still had their old instructions so we just updated the arrival time and the time of the 2nd half of the prep. She wrote everything down and read it back to me and verbalized understanding. New Rx for suprep will be called into the pharmacy, encourage patient to drink plenty of fluids while prepping.   Of note patient is also aware that he needs to hold Plavix 5 days before his procedure.

## 2019-11-16 ENCOUNTER — Other Ambulatory Visit: Payer: Self-pay

## 2019-11-16 ENCOUNTER — Encounter: Payer: Self-pay | Admitting: Gastroenterology

## 2019-11-16 ENCOUNTER — Ambulatory Visit (AMBULATORY_SURGERY_CENTER): Payer: Medicare Other | Admitting: Gastroenterology

## 2019-11-16 VITALS — BP 116/53 | HR 79 | Temp 98.4°F | Resp 14 | Ht 70.25 in | Wt 144.0 lb

## 2019-11-16 DIAGNOSIS — K2950 Unspecified chronic gastritis without bleeding: Secondary | ICD-10-CM

## 2019-11-16 DIAGNOSIS — K3189 Other diseases of stomach and duodenum: Secondary | ICD-10-CM | POA: Diagnosis not present

## 2019-11-16 DIAGNOSIS — D120 Benign neoplasm of cecum: Secondary | ICD-10-CM

## 2019-11-16 DIAGNOSIS — Z1211 Encounter for screening for malignant neoplasm of colon: Secondary | ICD-10-CM | POA: Diagnosis not present

## 2019-11-16 DIAGNOSIS — I2510 Atherosclerotic heart disease of native coronary artery without angina pectoris: Secondary | ICD-10-CM | POA: Diagnosis not present

## 2019-11-16 DIAGNOSIS — D125 Benign neoplasm of sigmoid colon: Secondary | ICD-10-CM

## 2019-11-16 DIAGNOSIS — R110 Nausea: Secondary | ICD-10-CM | POA: Diagnosis not present

## 2019-11-16 DIAGNOSIS — R935 Abnormal findings on diagnostic imaging of other abdominal regions, including retroperitoneum: Secondary | ICD-10-CM | POA: Diagnosis not present

## 2019-11-16 DIAGNOSIS — K449 Diaphragmatic hernia without obstruction or gangrene: Secondary | ICD-10-CM | POA: Diagnosis not present

## 2019-11-16 DIAGNOSIS — D122 Benign neoplasm of ascending colon: Secondary | ICD-10-CM | POA: Diagnosis not present

## 2019-11-16 DIAGNOSIS — K648 Other hemorrhoids: Secondary | ICD-10-CM

## 2019-11-16 DIAGNOSIS — I10 Essential (primary) hypertension: Secondary | ICD-10-CM | POA: Diagnosis not present

## 2019-11-16 DIAGNOSIS — R634 Abnormal weight loss: Secondary | ICD-10-CM | POA: Diagnosis not present

## 2019-11-16 MED ORDER — SODIUM CHLORIDE 0.9 % IV SOLN: 500.0000 mL | INTRAVENOUS | Status: DC

## 2019-11-16 NOTE — Patient Instructions (Signed)
Handout on polyps given  .Resume Plavix at prior dose tomorrow.   YOU HAD AN ENDOSCOPIC PROCEDURE TODAY AT [facility]:   Refer to the procedure report that was given to you for any specific questions about what was found during the examination.  If the procedure report does not answer your questions, please call your gastroenterologist to clarify.  If you requested that your care partner not be given the details of your procedure findings, then the procedure report has been included in a sealed envelope for you to review at your convenience later.  YOU SHOULD EXPECT: Some feelings of bloating in the abdomen. Passage of more gas than usual.  Walking can help get rid of the air that was put into your GI tract during the procedure and reduce the bloating. If you had a lower endoscopy (such as a colonoscopy or flexible sigmoidoscopy) you may notice spotting of blood in your stool or on the toilet paper. If you underwent a bowel prep for your procedure, you may not have a normal bowel movement for a few days.  Please Note:  You might notice some irritation and congestion in your nose or some drainage.  This is from the oxygen used during your procedure.  There is no need for concern and it should clear up in a day or so.  SYMPTOMS TO REPORT IMMEDIATELY:   Following lower endoscopy (colonoscopy or flexible sigmoidoscopy):  Excessive amounts of blood in the stool  Significant tenderness or worsening of abdominal pains  Swelling of the abdomen that is new, acute  Fever of 100F or higher   Following upper endoscopy (EGD)  Vomiting of blood or coffee ground material  New chest pain or pain under the shoulder blades  Painful or persistently difficult swallowing  New shortness of breath  Fever of 100F or higher  Black, tarry-looking stools  For urgent or emergent issues, a gastroenterologist can be reached at any hour by calling [phone].   DIET:  We do recommend a small  meal at first, but then you may proceed to your regular diet.  Drink plenty of fluids but you should avoid alcoholic beverages for 24 hours.  ACTIVITY:  You should plan to take it easy for the rest of today and you should NOT DRIVE or use heavy machinery until tomorrow (because of the sedation medicines used during the test).    FOLLOW UP: Our staff will call the number listed on your records 48-72 hours following your procedure to check on you and address any questions or concerns that you may have regarding the information given to you following your procedure. If we do not reach you, we will leave a message.  We will attempt to reach you two times.  During this call, we will ask if you have developed any symptoms of COVID 19. If you develop any symptoms (ie: fever, flu-like symptoms, shortness of breath, cough etc.) before then, please call [phone].  If you test positive for Covid 19 in the 2 weeks post procedure, please call and report this information to [place].    If any biopsies were taken you will be contacted by phone or by letter within the next 1-3 weeks.  Please call us at [phone] if you have not heard about the biopsies in 3 weeks.    SIGNATURES/CONFIDENTIALITY: You and/or your care partner have signed paperwork which will be entered into your electronic medical record.  These signatures attest to the fact that that  the information above on your After Visit Summary has been reviewed and is understood.  Full responsibility of the confidentiality of this discharge information lies with you and/or your care-partner.

## 2019-11-16 NOTE — Progress Notes (Signed)
Temp by LC and vitals by [place] 

## 2019-11-16 NOTE — Op Note (Addendum)
[place] Patient Name: Noan Witko Procedure Date: 11/16/2019 2:20 PM MRN: [id number] Endoscopist: Thornton Park MD, MD Age: 78 Referring MD:  Date of Birth: [birth year] Gender: Male Account #: [id number] Procedure:                Colonoscopy Indications:              Abnormal CT of the GI tract (mass like mural                            thickening of the rectum), Weight loss (90 pounds                            over 4-5 months) Medicines:                Monitored Anesthesia Care Procedure:                Pre-Anesthesia Assessment:                           - Prior to the procedure, a History and Physical                            was performed, and patient medications and                            allergies were reviewed. The patient's tolerance of                            previous anesthesia was also reviewed. The risks                            and benefits of the procedure and the sedation                            options and risks were discussed with the patient.                            All questions were answered, and informed consent                            was obtained. Prior Anticoagulants: The patient has                            taken Plavix (clopidogrel), last dose was 5 days                            prior to procedure. ASA Grade Assessment: III - A                            patient with severe systemic disease. After                            reviewing the risks and benefits, the patient was  deemed in satisfactory condition to undergo the                            procedure.                           After obtaining informed consent, the colonoscope                            was passed under direct vision. Throughout the                            procedure, the patient's blood pressure, pulse, and                            oxygen saturations were monitored continuously. The                             Colonoscope was introduced through the anus and                            advanced to the the cecum, identified by                            appendiceal orifice and ileocecal valve. The                            colonoscopy was unusually difficult due to poor                            endoscopic visualization, a redundant colon,                            significant looping and a tortuous colon.                            Successful completion of the procedure was aided by                            applying abdominal pressure and lavage. The patient                            tolerated the procedure well. The quality of the                            bowel preparation was poor. The ileocecal valve,                            appendiceal orifice, and rectum were photographed. Scope In: 2:40:52 PM Scope Out: 3:06:39 PM Scope Withdrawal Time: 0 hours 20 minutes 57 seconds  Total Procedure Duration: 0 hours 25 minutes 47 seconds  Findings:                 The perianal and digital rectal examinations were  normal.                           Non-bleeding internal hemorrhoids were found.                           Four sessile polyps were found in the sigmoid colon                            (1), ascending colon (2) and cecum (1). The polyps                            were 2 to 7 mm in size. These polyps were removed                            with a cold snare. Resection and retrieval were                            complete. Estimated blood loss was minimal.                           A considerable amount of chunky stool remained in                            the colon. In areas, this was too thick for lavage.                            Although small lesions cannot be excluded, the prep                            was adequate to evaluate for medium and large                            lesions. The rectum appeared normal. The exam was                             otherwise without abnormality on direct and                            retroflexion views. Complications:            No immediate complications. Estimated blood loss:                            Minimal. Estimated Blood Loss:     Estimated blood loss was minimal. Impression:               - Preparation of the colon was poor.                           - Non-bleeding internal hemorrhoids.                           - Four 2 to 7 mm polyps in the sigmoid colon, in  the ascending colon and in the cecum, removed with                            a cold snare. Resected and retrieved.                           - The examination was otherwise normal on direct                            and retroflexion views.                           - Source of weight loss not identified on this                            study. Recommendation:           - Patient has a contact number available for                            emergencies. The signs and symptoms of potential                            delayed complications were discussed with the                            patient. Return to normal activities tomorrow.                            Written discharge instructions were provided to the                            patient.                           - Resume previous diet today.                           - Continue present medications.                           - Resume Plavix (clopidogrel) at prior dose                            tomorrow.                           - Await pathology results.                           - Repeat colonoscopy date to be determined after                            pending pathology results are reviewed for                            surveillance.                           -  Follow-up with PA Lemmon in the office to review                            these results Thornton Park MD, MD 11/16/2019 3:29:39 PM This report has been signed  electronically.

## 2019-11-16 NOTE — Progress Notes (Signed)
Report given to PACU, vss 

## 2019-11-16 NOTE — Progress Notes (Signed)
Called to room to assist during endoscopic procedure.  Patient ID and intended procedure confirmed with present staff. Received instructions for my participation in the procedure from the performing physician.  

## 2019-11-16 NOTE — Op Note (Addendum)
[place] Patient Name: Travis Woodward Procedure Date: 11/16/2019 2:20 PM MRN: [id number] Endoscopist: Thornton Park MD, MD Age: 78 Referring MD:  Date of Birth: 1941/05/15 Gender: Male Account #: [id number] Procedure:                Upper GI endoscopy Indications:              Weight loss - 90 pounds over 4-5 months Medicines:                Monitored Anesthesia Care Procedure:                Pre-Anesthesia Assessment:                           - Prior to the procedure, a History and Physical                            was performed, and patient medications and                            allergies were reviewed. The patient's tolerance of                            previous anesthesia was also reviewed. The risks                            and benefits of the procedure and the sedation                            options and risks were discussed with the patient.                            All questions were answered, and informed consent                            was obtained. Prior Anticoagulants: The patient has                            taken Plavix (clopidogrel), last dose was 5 days                            prior to procedure. ASA Grade Assessment: III - A                            patient with severe systemic disease. After                            reviewing the risks and benefits, the patient was                            deemed in satisfactory condition to undergo the                            procedure.  After obtaining informed consent, the endoscope was                            passed under direct vision. Throughout the                            procedure, the patient's blood pressure, pulse, and                            oxygen saturations were monitored continuously. The                            Endoscope was introduced through the mouth, and                            advanced to the third part of duodenum. The upper                             GI endoscopy was accomplished without difficulty.                            The patient tolerated the procedure well. Scope In: Scope Out: Findings:                 The examined esophagus was normal.                           Diffuse moderate inflammation characterized by                            erythema, friability and granularity was found in                            the gastric body and in the gastric antrum.                            Biopsies were taken with a cold forceps for                            histology. Estimated blood loss was minimal.                           A small hiatal hernia was present.                           The examined duodenum was normal.                           The exam was otherwise without abnormality. Complications:            No immediate complications. Estimated blood loss:                            Minimal. Estimated Blood Loss:     Estimated blood loss was minimal. Impression:               -  Normal esophagus.                           - Gastritis. Biopsied.                           - Small hiatal hernia.                           - Normal examined duodenum.                           - The examination was otherwise normal. Source of                            weight loss not identified on this examination. Recommendation:           - Patient has a contact number available for                            emergencies. The signs and symptoms of potential                            delayed complications were discussed with the                            patient. Return to normal activities tomorrow.                            Written discharge instructions were provided to the                            patient.                           - Resume previous diet today.                           - Continue present medications.                           - Await pathology results.                           - Proceed  with colonoscopy as previously planned. Thornton Park MD, MD 11/16/2019 3:21:47 PM This report has been signed electronically.

## 2019-11-17 ENCOUNTER — Encounter: Payer: Self-pay | Admitting: *Deleted

## 2019-11-20 ENCOUNTER — Telehealth: Payer: Self-pay

## 2019-11-20 ENCOUNTER — Telehealth: Payer: Self-pay | Admitting: *Deleted

## 2019-11-20 NOTE — Telephone Encounter (Signed)
NO ANSWER, MESSAGE LEFT FOR PATIENT. 

## 2019-11-20 NOTE — Telephone Encounter (Signed)
1. Have you developed a fever since your procedure? no  2.   Have you had an respiratory symptoms (SOB or cough) since your procedure? no  3.   Have you tested positive for COVID 19 since your procedure no  4.   Have you had any family members/close contacts diagnosed with the COVID 19 since your procedure?  no   If yes to any of these questions please route to Joylene John, RN and Alphonsa Gin, Therapist, sports.    Follow up Call-  Call back number 11/16/2019  Post procedure Call Back phone  # [phone]  Permission to leave phone message Yes  Some recent data might be hidden     Patient questions:  Do you have a fever, pain , or abdominal swelling? No. Pain Score  0 *  Have you tolerated food without any problems? Yes.    Have you been able to return to your normal activities? Yes.    Do you have any questions about your discharge instructions: Diet   No. Medications  No. Follow up visit  No.  Do you have questions or concerns about your Care? No.  Actions: * If pain score is 4 or above: No action needed, pain <4.

## 2019-11-27 DIAGNOSIS — N1831 Chronic kidney disease, stage 3a: Secondary | ICD-10-CM | POA: Diagnosis not present

## 2019-11-27 DIAGNOSIS — I129 Hypertensive chronic kidney disease with stage 1 through stage 4 chronic kidney disease, or unspecified chronic kidney disease: Secondary | ICD-10-CM | POA: Diagnosis not present

## 2019-11-27 DIAGNOSIS — E7849 Other hyperlipidemia: Secondary | ICD-10-CM | POA: Diagnosis not present

## 2019-11-27 DIAGNOSIS — I2510 Atherosclerotic heart disease of native coronary artery without angina pectoris: Secondary | ICD-10-CM | POA: Diagnosis not present

## 2019-12-20 ENCOUNTER — Encounter: Payer: Self-pay | Admitting: Physician Assistant

## 2019-12-20 ENCOUNTER — Ambulatory Visit (INDEPENDENT_AMBULATORY_CARE_PROVIDER_SITE_OTHER): Payer: Medicare Other | Admitting: Physician Assistant

## 2019-12-20 VITALS — BP 128/60 | HR 88 | Temp 97.6°F | Ht 72.0 in | Wt 154.0 lb

## 2019-12-20 DIAGNOSIS — I2510 Atherosclerotic heart disease of native coronary artery without angina pectoris: Secondary | ICD-10-CM

## 2019-12-20 DIAGNOSIS — Z86010 Personal history of colonic polyps: Secondary | ICD-10-CM

## 2019-12-20 DIAGNOSIS — I2583 Coronary atherosclerosis due to lipid rich plaque: Secondary | ICD-10-CM | POA: Diagnosis not present

## 2019-12-20 DIAGNOSIS — R634 Abnormal weight loss: Secondary | ICD-10-CM

## 2019-12-20 NOTE — Progress Notes (Signed)
Chief Complaint: Follow-up after procedures  HPI:    [name] is a 78 year old male with a past medical history of CKD, CAD on Plavix (echo 06/29/2019 with LVEF 60-65%), aortic stenosis and others listed below, known to Dr. Tarri Glenn, who presents to clinic today for follow-up after his recent procedures.    10/05/2019 office visit to discuss abnormal CT of the pelvis showing thickening in the rectum and a weight loss of 90 pounds in the past 4 to 5 months (per endocrinology had high cortisol levels).    11/20/2019 colonoscopy for an abnormal CT of the GI tract with masslike mural thickening of the rectum and weight loss with poor preparation of the colon, nonbleeding internal hemorrhoids, 4 2-7 mm polyps in the sigmoid colon, in the ascending colon and in the cecum, otherwise normal exam.  All 4 polyps were adenomas.  Given the poor prep it was recommended patient consider repeating colonoscopy in 1 to 2 years with a 2-day prep.  EGD with normal esophagus, gastritis, small hiatal hernia and otherwise normal.  Biopsies showed gastritis with no infection or cancer.    Today, the patient explains that he is feeling great.  He has gained 10 pounds since October and has no GI complaints.  Explains that he did have a little bit of indigestion 2 days ago and took 2 Tums and it went away but this is "because I ate the wrong thing".  Tells me that he did have some abdominal swelling back in October but started Align on his own accord and this went away as well.  Overall the patient is very happy.  Does explain that he does not think the prep that he used last time worked very well and would like to try GoLYTELY in a year when he needs a repeat.    Denies fever, chills, continued weight loss or abdominal pain.  Past Medical History:  Diagnosis Date  . Chronic kidney disease    stage 3  . Colon polyps   . Complication of anesthesia    difficulty urinating   . Coronary artery disease   . Enlarged  prostate   . Hyperlipidemia   . Hypertension     Past Surgical History:  Procedure Laterality Date  . acd fusion & plating    . BACK SURGERY     x 6  . CARDIAC CATHETERIZATION  2005  . CHOLECYSTECTOMY N/A 07/19/2017   Procedure: LAPAROSCOPIC CHOLECYSTECTOMY;  Surgeon: Aviva Signs, MD;  Location: [facility];  Service: General;  Laterality: N/A;  . CORONARY STENT PLACEMENT  2005  . HEMORRHOID SURGERY    . HIP ARTHROPLASTY Left 06/29/2019   Procedure: ARTHROPLASTY BIPOLAR HIP (HEMIARTHROPLASTY);  Surgeon: Marchia Bond, MD;  Location: [facility];  Service: Orthopedics;  Laterality: Left;  . LAPAROSCOPIC APPENDECTOMY  04/17/2012   Procedure: APPENDECTOMY LAPAROSCOPIC;  Surgeon: Jamesetta So, MD;  Location: [facility];  Service: General;  Laterality: N/A;  . TONSILLECTOMY      Current Outpatient Medications  Medication Sig Dispense Refill  . aspirin EC 81 MG tablet Take by mouth.    . cholecalciferol (VITAMIN D) 1000 units tablet Take 1,000 Units by mouth daily.    . clopidogrel (PLAVIX) 75 MG tablet Take 1 tablet by mouth daily.    . Cyanocobalamin (VITAMIN B-12 IJ) Inject 1 mL as directed every 30 (thirty) days.     . nitroGLYCERIN (NITROSTAT) 0.4 MG SL tablet Place 0.4 mg under the tongue every 5 (five) minutes as  needed.     Marland Kitchen oxyCODONE (ROXICODONE) 15 MG immediate release tablet Take 1 tablet (15 mg total) by mouth every 4 (four) hours as needed. He takes 3-4 times a day as needed 30 tablet 0   No current facility-administered medications for this visit.    Allergies as of 12/20/2019 - Review Complete 11/16/2019  Allergen Reaction Noted  . Crestor [rosuvastatin]  01/02/2015  . Paroxetine Rash     Family History  Problem Relation Age of Onset  . Heart attack Mother   . Tuberculosis Maternal Grandfather   . Adrenal disorder Neg Hx     Social History   Socioeconomic History  . Marital status: Married    Spouse name: Not on file  . Number of children: 2  . Years of education:  Not on file  . Highest education level: Not on file  Occupational History  . Occupation: retired  Tobacco Use  . Smoking status: Former Smoker    Quit date: 07/16/1972    Years since quitting: 47.4  . Smokeless tobacco: Never Used  Substance and Sexual Activity  . Alcohol use: No  . Drug use: No  . Sexual activity: Never  Other Topics Concern  . Not on file  Social History Narrative  . Not on file   Social Determinants of Health   Financial Resource Strain:   . Difficulty of Paying Living Expenses: Not on file  Food Insecurity:   . Worried About Charity fundraiser in the Last Year: Not on file  . Ran Out of Food in the Last Year: Not on file  Transportation Needs:   . Lack of Transportation (Medical): Not on file  . Lack of Transportation (Non-Medical): Not on file  Physical Activity:   . Days of Exercise per Week: Not on file  . Minutes of Exercise per Session: Not on file  Stress:   . Feeling of Stress : Not on file  Social Connections:   . Frequency of Communication with Friends and Family: Not on file  . Frequency of Social Gatherings with Friends and Family: Not on file  . Attends Religious Services: Not on file  . Active Member of Clubs or Organizations: Not on file  . Attends Archivist Meetings: Not on file  . Marital Status: Not on file  Intimate Partner Violence:   . Fear of Current or Ex-Partner: Not on file  . Emotionally Abused: Not on file  . Physically Abused: Not on file  . Sexually Abused: Not on file    Review of Systems:    Constitutional: No weight loss, fever or chills Cardiovascular: No chest pain Respiratory: No SOB Gastrointestinal: See HPI and otherwise negative   Physical Exam:  Vital signs: BP 128/60   Pulse 88   Temp 97.6 F (36.4 C)   Ht 6' (1.829 m)   Wt 154 lb (69.9 kg)   BMI 20.89 kg/m   Constitutional:   Pleasant Caucasian male appears to be in NAD, Well developed, Well nourished, alert and cooperative  Respiratory: Respirations even and unlabored. Lungs clear to auscultation bilaterally.   No wheezes, crackles, or rhonchi.  Cardiovascular: Normal S1, S2. No MRG. Regular rate and rhythm. No peripheral edema, cyanosis or pallor.  Gastrointestinal:  Soft, nondistended, nontender. No rebound or guarding. Normal bowel sounds. No appreciable masses or hepatomegaly. Rectal:  Not performed.  Psychiatric: Demonstrates good judgement and reason without abnormal affect or behaviors.  No recent labs or imaging.  Assessment: 1.  Weight loss: Patient lost 90 pounds over the 3 to 58-month period, now is gaining weight back, has gained a total of 10 pounds over the past 2 months, EGD and colonoscopy with no source found  2.  Adenomatous polyps: Seen on recent colonoscopy in November, repeat recommended a year due to poor bowel prep  Plan: 1.  Discussed results from EGD and colonoscopy with the patient today.  Spent time answering all of his questions.  Explained that he was placed in recall for 1 year from now for repeat colonoscopy given that he did not have very good prep.  Patient tells me he would like to do the GoLYTELY next time because this "cleaned me out really good". 2.  Patient is now gaining weight, encouraged him to continue his eating. 3.  Patient to follow in clinic in a year or before then as needed with Dr. Tarri Glenn or myself  Ellouise Newer, PA-C [facility] 12/20/2019, 9:49 AM  Cc: Redmond School, MD

## 2019-12-20 NOTE — Progress Notes (Signed)
Reviewed and agree with management plans. Golytely to be used for surveillance colonoscopy.   Graycen Sadlon L. Tarri Glenn, MD, MPH

## 2019-12-20 NOTE — Patient Instructions (Addendum)
If you are age 78 or older, your body mass index should be between 23-30. Your Body mass index is 20.89 kg/m. If this is out of the aforementioned range listed, please consider follow up with your Primary Care Provider.  If you are age 78 or younger, your body mass index should be between 19-25. Your Body mass index is 20.89 kg/m. If this is out of the aformentioned range listed, please consider follow up with your Primary Care Provider.    Follow up as needed   I appreciate the  opportunity to care for you  Thank You   Ellouise Newer, PA-C

## 2019-12-28 DIAGNOSIS — I2510 Atherosclerotic heart disease of native coronary artery without angina pectoris: Secondary | ICD-10-CM | POA: Diagnosis not present

## 2019-12-28 DIAGNOSIS — F4542 Pain disorder with related psychological factors: Secondary | ICD-10-CM | POA: Diagnosis not present

## 2019-12-28 DIAGNOSIS — G894 Chronic pain syndrome: Secondary | ICD-10-CM | POA: Diagnosis not present

## 2020-01-28 DIAGNOSIS — I129 Hypertensive chronic kidney disease with stage 1 through stage 4 chronic kidney disease, or unspecified chronic kidney disease: Secondary | ICD-10-CM | POA: Diagnosis not present

## 2020-01-28 DIAGNOSIS — I2510 Atherosclerotic heart disease of native coronary artery without angina pectoris: Secondary | ICD-10-CM | POA: Diagnosis not present

## 2020-01-28 DIAGNOSIS — N1831 Chronic kidney disease, stage 3a: Secondary | ICD-10-CM | POA: Diagnosis not present

## 2020-01-28 DIAGNOSIS — E7849 Other hyperlipidemia: Secondary | ICD-10-CM | POA: Diagnosis not present

## 2020-02-01 DIAGNOSIS — G894 Chronic pain syndrome: Secondary | ICD-10-CM | POA: Diagnosis not present

## 2020-02-07 ENCOUNTER — Ambulatory Visit (HOSPITAL_COMMUNITY): Payer: Medicare Other

## 2020-03-05 ENCOUNTER — Telehealth: Payer: Self-pay | Admitting: Cardiovascular Disease

## 2020-03-05 NOTE — Telephone Encounter (Signed)
Returned the call to the patient. He stated that for the past 3 days he has been feeling heart flutters and palpitations at rest. He stated that he does not feel it when he is up and moving around. He denies chest pain or shortness of breath and stated that he still does his normal activity.  He has been advised that if he develops chest pain or shortness of breath to go to the nearest ED or call 911.    An appointment has been made with Dr. Gwenlyn Found tomorrow per the patient request.

## 2020-03-05 NOTE — Telephone Encounter (Signed)
Follow up  ° ° °Patient is returning call.  °

## 2020-03-05 NOTE — Telephone Encounter (Signed)
Patient c/o Palpitations:  High priority if patient c/o lightheadedness, shortness of breath, or chest pain  1) How long have you had palpitations/irregular HR/ Afib? Are you having the symptoms now? no  2) Are you currently experiencing lightheadedness, SOB or CP? Lightheaded once yesterday  3) Do you have a history of afib (atrial fibrillation) or irregular heart rhythm? yes  4) Have you checked your BP or HR? (document readings if available): no  5) Are you experiencing any other symptoms? No   Pt does not notice the sx until he starts sitting down. He is up and active restoring cars and feels fine when he is active. He has just started noticing the palps more frequently. He is not in any sort of pain

## 2020-03-05 NOTE — Telephone Encounter (Signed)
Left message for patient to call back  

## 2020-03-06 ENCOUNTER — Other Ambulatory Visit: Payer: Self-pay

## 2020-03-06 ENCOUNTER — Telehealth: Payer: Self-pay | Admitting: Radiology

## 2020-03-06 ENCOUNTER — Ambulatory Visit (INDEPENDENT_AMBULATORY_CARE_PROVIDER_SITE_OTHER): Payer: Medicare Other | Admitting: Cardiovascular Disease

## 2020-03-06 ENCOUNTER — Encounter: Payer: Self-pay | Admitting: Cardiovascular Disease

## 2020-03-06 DIAGNOSIS — R002 Palpitations: Secondary | ICD-10-CM | POA: Diagnosis not present

## 2020-03-06 NOTE — Telephone Encounter (Signed)
Enrolled patient for a 14 day Zio monitor to be mailed to patients home.  

## 2020-03-06 NOTE — Patient Instructions (Signed)
Medication Instructions:  Your physician recommends that you continue on your current medications as directed. Please refer to the Current Medication list given to you today.  If you need a refill on your cardiac medications before your next appointment, please call your pharmacy.   Lab work: NONE  Testing/Procedures: 2 week Zio Patch  Follow-Up: At [facility], you and your health needs are our priority.  As part of our continuing mission to provide you with exceptional heart care, we have created designated Provider Care Teams.  These Care Teams include your primary Cardiologist (physician) and Advanced Practice Providers (APPs -  Physician Assistants and Nurse Practitioners) who all work together to provide you with the care you need, when you need it. You may see Quay Burow, MD or one of the following Advanced Practice Providers on your designated Care Team:    Kerin Ransom, PA-C  [place], [place]  Coletta Memos, [place]  Your physician wants you to follow-up in: 1 month and after you send your monitor back  Any Other Special Instructions Will Be Listed Below (If Applicable).  ZIO XT- Long Term Monitor Instructions   Your physician has requested you wear your ZIO patch monitor 14 days.   This is a single patch monitor.  [employer] supplies one patch monitor per enrollment.  Additional stickers are not available.   Please do not apply patch if you will be having a Nuclear Stress Test, Echocardiogram, Cardiac CT, MRI, or Chest Xray during the time frame you would be wearing the monitor. The patch cannot be worn during these tests.  You cannot remove and re-apply the ZIO XT patch monitor.   Your ZIO patch monitor will be sent USPS Priority mail from [facility] directly to your home address. The monitor may also be mailed to a PO BOX if home delivery is not available.   It may take 3-5 days to receive your monitor after you have been enrolled.   Once you have received  you monitor, please review enclosed instructions.  Your monitor has already been registered assigning a specific monitor serial # to you.   Applying the monitor   Shave hair from upper left chest.   Hold abrader disc by orange tab.  Rub abrader in 40 strokes over left upper chest as indicated in your monitor instructions.   Clean area with 4 enclosed alcohol pads .  Use all pads to assure are is cleaned thoroughly.  Let dry.   Apply patch as indicated in monitor instructions.  Patch will be place under collarbone on left side of chest with arrow pointing upward.   Rub patch adhesive wings for 2 minutes.Remove white label marked "1".  Remove white label marked "2".  Rub patch adhesive wings for 2 additional minutes.   While looking in a mirror, press and release button in center of patch.  A small green light will flash 3-4 times .  This will be your only indicator the monitor has been turned on.     Do not shower for the first 24 hours.  You may shower after the first 24 hours.   Press button if you feel a symptom. You will hear a small click.  Record Date, Time and Symptom in the Patient Log Book.   When you are ready to remove patch, follow instructions on last 2 pages of Patient Log Book.  Stick patch monitor onto last page of Patient Log Book.   Place Patient Log Book in [place] box.  Use locking tab on box and tape box closed securely.  The [employer] has [employer] on it.  Please place in mailbox as soon as possible.  Your physician should have your test results approximately 7 days after the monitor has been mailed back to [facility].   Call [place] at [phone] if you have questions regarding your ZIO XT patch monitor.  Call them immediately if you see an orange light blinking on your monitor.   If your monitor falls off in less than 4 days contact our Monitor department at [phone].  If your monitor becomes loose or falls off after 4  days call [employer] at [phone] for suggestions on securing your monitor.
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
## 2020-03-06 NOTE — Assessment & Plan Note (Signed)
Travis Woodward has noticed palpitations new in onset over last several days.  They have occurred every day since.  He does admit to having anxiety.  He drinks a lot of caffeine which I told him to just discontinue.  He had a normal TSH 08/28/2019 and normal 2D echo.  I am going to get a 2-week Zio patch to further evaluate.

## 2020-03-08 ENCOUNTER — Other Ambulatory Visit (INDEPENDENT_AMBULATORY_CARE_PROVIDER_SITE_OTHER): Payer: Medicare Other

## 2020-03-08 DIAGNOSIS — R002 Palpitations: Secondary | ICD-10-CM | POA: Diagnosis not present

## 2020-03-27 DIAGNOSIS — I2510 Atherosclerotic heart disease of native coronary artery without angina pectoris: Secondary | ICD-10-CM | POA: Diagnosis not present

## 2020-03-27 DIAGNOSIS — E7849 Other hyperlipidemia: Secondary | ICD-10-CM | POA: Diagnosis not present

## 2020-03-27 DIAGNOSIS — N1831 Chronic kidney disease, stage 3a: Secondary | ICD-10-CM | POA: Diagnosis not present

## 2020-03-27 DIAGNOSIS — I129 Hypertensive chronic kidney disease with stage 1 through stage 4 chronic kidney disease, or unspecified chronic kidney disease: Secondary | ICD-10-CM | POA: Diagnosis not present

## 2020-03-29 IMAGING — DX PORTABLE PELVIS 1-2 VIEWS
1 series · 1 of 1 positions shown · non-contrast
Comparison: None.

CLINICAL DATA: Status post left hip hemiarthroplasty

EXAM:
PORTABLE PELVIS 1-2 VIEWS

[pelvis ap]
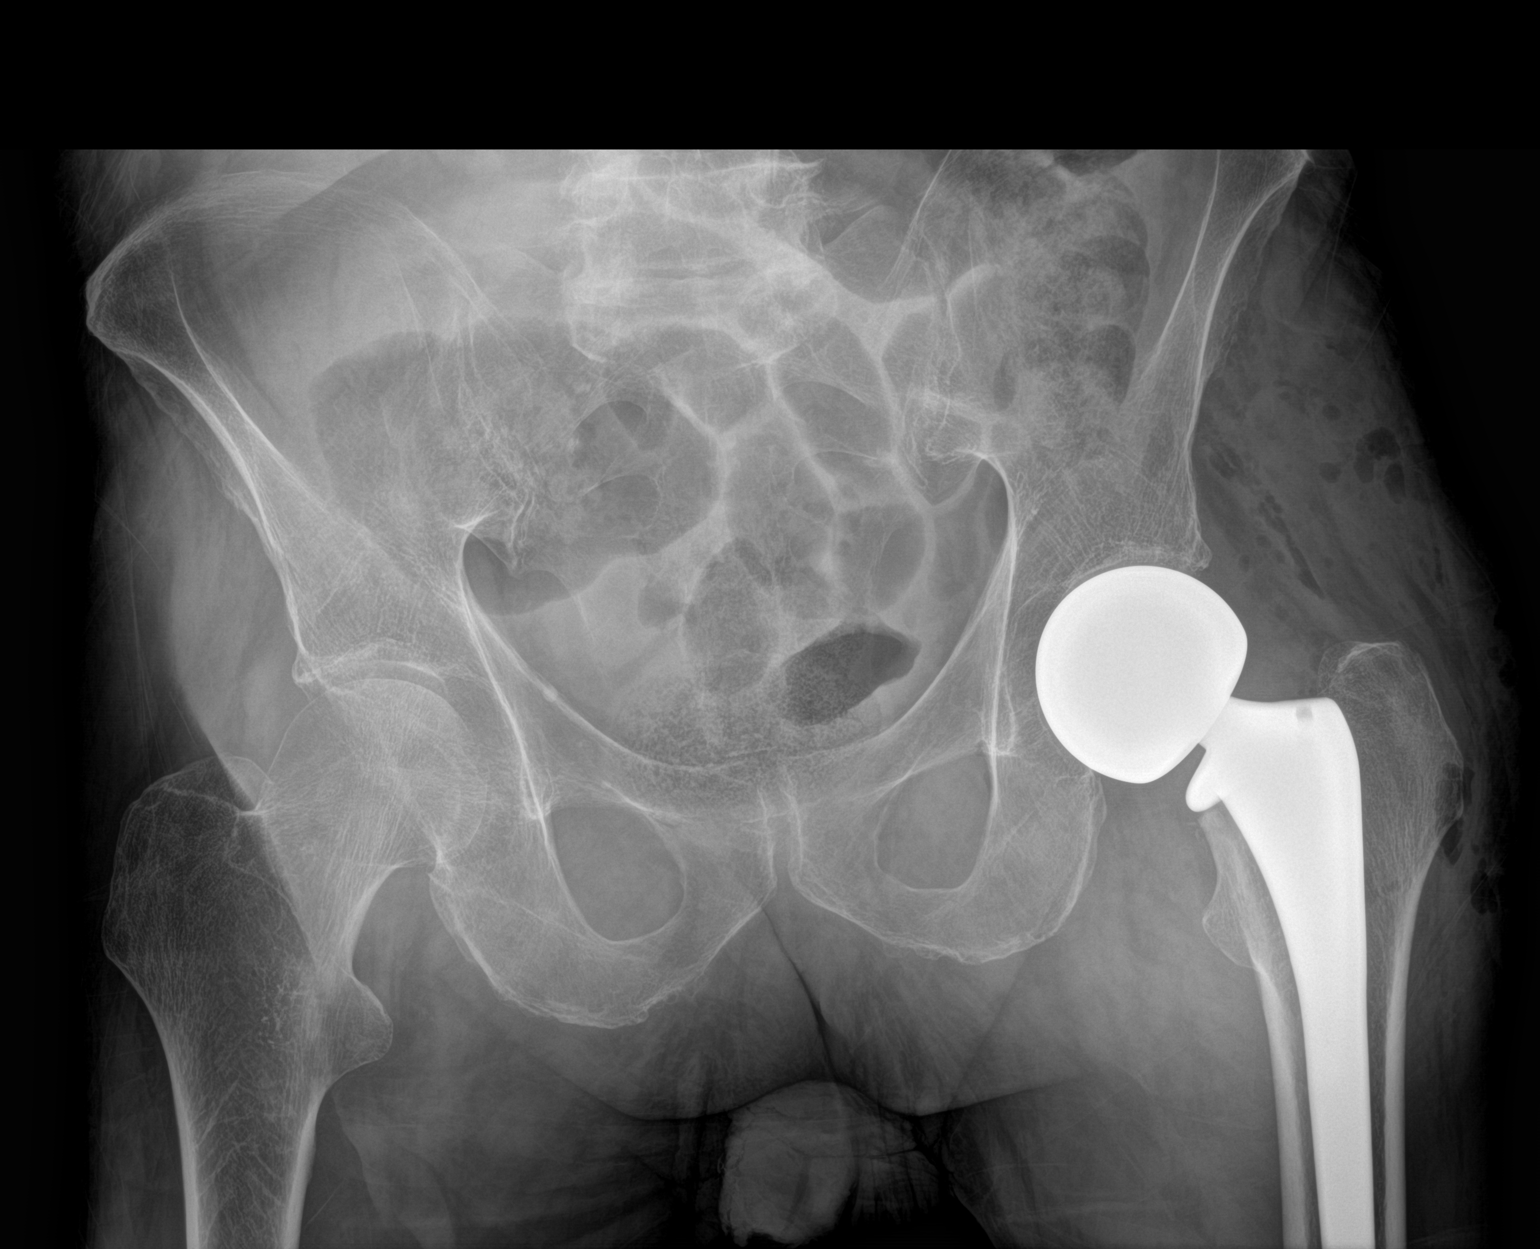

[1 of 1 positions shown; findings below may reference images not displayed]

FINDINGS: Left hip prosthesis is noted in satisfactory position. No other
fracture is noted. No soft tissue abnormality is seen.
IMPRESSION: Status post left hip hemiarthroplasty.

## 2020-03-30 DIAGNOSIS — R002 Palpitations: Secondary | ICD-10-CM | POA: Diagnosis not present

## 2020-04-01 DIAGNOSIS — G894 Chronic pain syndrome: Secondary | ICD-10-CM | POA: Diagnosis not present

## 2020-04-01 DIAGNOSIS — M1991 Primary osteoarthritis, unspecified site: Secondary | ICD-10-CM | POA: Diagnosis not present

## 2020-04-16 ENCOUNTER — Encounter: Payer: Self-pay | Admitting: Cardiovascular Disease

## 2020-04-16 ENCOUNTER — Ambulatory Visit (INDEPENDENT_AMBULATORY_CARE_PROVIDER_SITE_OTHER): Payer: Medicare Other | Admitting: Cardiovascular Disease

## 2020-04-16 ENCOUNTER — Other Ambulatory Visit: Payer: Self-pay

## 2020-04-16 VITALS — BP 135/63 | HR 78 | Ht 73.0 in | Wt 165.0 lb

## 2020-04-16 DIAGNOSIS — R002 Palpitations: Secondary | ICD-10-CM

## 2020-04-16 NOTE — Progress Notes (Signed)
Mr. Schlicher returns today for follow-up of his 2-week Zio patch performed in evaluation of symptomatic palpitations.  This resulted on 03/08/2020 revealing frequent PVCs occasionally in a by and trigeminal pattern with runs of SVT.  At my urging he dramatically decrease his caffeine intake as result his palpitations have significantly improved.  At this point no further evaluation is warranted.  We talked about the importance of cessation of caffeine intake.  I will see him back in a year.  Lorretta Harp, M.D., [place], [facility], [address] [place] [address] [place], [place]  [id number]  [phone] 04/16/2020 3:08 PM

## 2020-04-16 NOTE — Patient Instructions (Signed)
Medication Instructions:  Your physician recommends that you continue on your current medications as directed. Please refer to the Current Medication list given to you today.  *If you need a refill on your cardiac medications before your next appointment, please call your pharmacy*   Follow-Up: At [facility], you and your health needs are our priority.  As part of our continuing mission to provide you with exceptional heart care, we have created designated Provider Care Teams.  These Care Teams include your primary Cardiologist (physician) and Advanced Practice Providers (APPs -  Physician Assistants and Nurse Practitioners) who all work together to provide you with the care you need, when you need it.  We recommend signing up for the patient portal called "MyChart".  Sign up information is provided on this After Visit Summary.  MyChart is used to connect with patients for Virtual Visits (Telemedicine).  Patients are able to view lab/test results, encounter notes, upcoming appointments, etc.  Non-urgent messages can be sent to your provider as well.   To learn more about what you can do with MyChart, go to [email or web address].    Your next appointment:   6 month(s)  The format for your next appointment:   In Person  Provider:    Luke Kilroy, PA-C  Callie Goodrich, PA-C  Jesse Cleaver, FNP  12 months with Dr. Berry   

## 2020-04-26 DIAGNOSIS — E7849 Other hyperlipidemia: Secondary | ICD-10-CM | POA: Diagnosis not present

## 2020-04-26 DIAGNOSIS — I2510 Atherosclerotic heart disease of native coronary artery without angina pectoris: Secondary | ICD-10-CM | POA: Diagnosis not present

## 2020-04-26 DIAGNOSIS — N1831 Chronic kidney disease, stage 3a: Secondary | ICD-10-CM | POA: Diagnosis not present

## 2020-04-26 DIAGNOSIS — I129 Hypertensive chronic kidney disease with stage 1 through stage 4 chronic kidney disease, or unspecified chronic kidney disease: Secondary | ICD-10-CM | POA: Diagnosis not present

## 2020-05-01 DIAGNOSIS — G894 Chronic pain syndrome: Secondary | ICD-10-CM | POA: Diagnosis not present

## 2020-05-30 DIAGNOSIS — M1991 Primary osteoarthritis, unspecified site: Secondary | ICD-10-CM | POA: Diagnosis not present

## 2020-05-30 DIAGNOSIS — G894 Chronic pain syndrome: Secondary | ICD-10-CM | POA: Diagnosis not present

## 2020-07-26 DIAGNOSIS — M1991 Primary osteoarthritis, unspecified site: Secondary | ICD-10-CM | POA: Diagnosis not present

## 2020-07-26 DIAGNOSIS — G894 Chronic pain syndrome: Secondary | ICD-10-CM | POA: Diagnosis not present

## 2020-07-26 DIAGNOSIS — I10 Essential (primary) hypertension: Secondary | ICD-10-CM | POA: Diagnosis not present

## 2020-07-26 DIAGNOSIS — Z789 Other specified health status: Secondary | ICD-10-CM | POA: Diagnosis not present

## 2020-08-23 DIAGNOSIS — Z6821 Body mass index (BMI) 21.0-21.9, adult: Secondary | ICD-10-CM | POA: Diagnosis not present

## 2020-08-23 DIAGNOSIS — G894 Chronic pain syndrome: Secondary | ICD-10-CM | POA: Diagnosis not present

## 2020-08-23 DIAGNOSIS — D510 Vitamin B12 deficiency anemia due to intrinsic factor deficiency: Secondary | ICD-10-CM | POA: Diagnosis not present

## 2020-09-25 DIAGNOSIS — Z6821 Body mass index (BMI) 21.0-21.9, adult: Secondary | ICD-10-CM | POA: Diagnosis not present

## 2020-09-25 DIAGNOSIS — E538 Deficiency of other specified B group vitamins: Secondary | ICD-10-CM | POA: Diagnosis not present

## 2020-09-25 DIAGNOSIS — G894 Chronic pain syndrome: Secondary | ICD-10-CM | POA: Diagnosis not present

## 2020-09-25 DIAGNOSIS — M1991 Primary osteoarthritis, unspecified site: Secondary | ICD-10-CM | POA: Diagnosis not present

## 2020-10-16 DIAGNOSIS — G894 Chronic pain syndrome: Secondary | ICD-10-CM | POA: Diagnosis not present

## 2020-10-29 ENCOUNTER — Ambulatory Visit (INDEPENDENT_AMBULATORY_CARE_PROVIDER_SITE_OTHER): Payer: Medicare Other | Admitting: Cardiology

## 2020-10-29 ENCOUNTER — Encounter: Payer: Self-pay | Admitting: Cardiology

## 2020-10-29 ENCOUNTER — Other Ambulatory Visit: Payer: Self-pay

## 2020-10-29 VITALS — BP 130/70 | HR 72 | Ht 74.0 in | Wt 168.0 lb

## 2020-10-29 DIAGNOSIS — R002 Palpitations: Secondary | ICD-10-CM | POA: Diagnosis not present

## 2020-10-29 DIAGNOSIS — I10 Essential (primary) hypertension: Secondary | ICD-10-CM

## 2020-10-29 DIAGNOSIS — Z9861 Coronary angioplasty status: Secondary | ICD-10-CM

## 2020-10-29 DIAGNOSIS — E782 Mixed hyperlipidemia: Secondary | ICD-10-CM

## 2020-10-29 DIAGNOSIS — I2510 Atherosclerotic heart disease of native coronary artery without angina pectoris: Secondary | ICD-10-CM | POA: Diagnosis not present

## 2020-10-29 NOTE — Assessment & Plan Note (Signed)
LAD DES '05-low risk Myoview 2010 No history to suggest recurrent angina
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
## 2020-10-29 NOTE — Patient Instructions (Signed)

## 2020-12-13 DIAGNOSIS — Z23 Encounter for immunization: Secondary | ICD-10-CM | POA: Diagnosis not present

## 2021-01-14 DIAGNOSIS — G894 Chronic pain syndrome: Secondary | ICD-10-CM | POA: Diagnosis not present

## 2021-01-14 DIAGNOSIS — I10 Essential (primary) hypertension: Secondary | ICD-10-CM | POA: Diagnosis not present

## 2021-01-14 DIAGNOSIS — M1991 Primary osteoarthritis, unspecified site: Secondary | ICD-10-CM | POA: Diagnosis not present

## 2021-01-28 DIAGNOSIS — I10 Essential (primary) hypertension: Secondary | ICD-10-CM | POA: Diagnosis not present

## 2021-01-28 DIAGNOSIS — J329 Chronic sinusitis, unspecified: Secondary | ICD-10-CM | POA: Diagnosis not present

## 2021-01-28 DIAGNOSIS — N183 Chronic kidney disease, stage 3 unspecified: Secondary | ICD-10-CM | POA: Diagnosis not present

## 2021-01-28 DIAGNOSIS — M1991 Primary osteoarthritis, unspecified site: Secondary | ICD-10-CM | POA: Diagnosis not present

## 2021-01-28 DIAGNOSIS — G894 Chronic pain syndrome: Secondary | ICD-10-CM | POA: Diagnosis not present

## 2021-02-18 DIAGNOSIS — G894 Chronic pain syndrome: Secondary | ICD-10-CM | POA: Diagnosis not present

## 2021-04-21 DIAGNOSIS — G894 Chronic pain syndrome: Secondary | ICD-10-CM | POA: Diagnosis not present

## 2021-04-21 DIAGNOSIS — E538 Deficiency of other specified B group vitamins: Secondary | ICD-10-CM | POA: Diagnosis not present

## 2021-04-21 DIAGNOSIS — M1991 Primary osteoarthritis, unspecified site: Secondary | ICD-10-CM | POA: Diagnosis not present

## 2021-04-21 DIAGNOSIS — D510 Vitamin B12 deficiency anemia due to intrinsic factor deficiency: Secondary | ICD-10-CM | POA: Diagnosis not present

## 2021-04-21 DIAGNOSIS — Z6822 Body mass index (BMI) 22.0-22.9, adult: Secondary | ICD-10-CM | POA: Diagnosis not present

## 2021-04-21 DIAGNOSIS — Z1331 Encounter for screening for depression: Secondary | ICD-10-CM | POA: Diagnosis not present

## 2021-06-11 DIAGNOSIS — I10 Essential (primary) hypertension: Secondary | ICD-10-CM | POA: Diagnosis not present

## 2021-06-11 DIAGNOSIS — M1991 Primary osteoarthritis, unspecified site: Secondary | ICD-10-CM | POA: Diagnosis not present

## 2021-06-11 DIAGNOSIS — G894 Chronic pain syndrome: Secondary | ICD-10-CM | POA: Diagnosis not present

## 2021-08-20 DIAGNOSIS — G894 Chronic pain syndrome: Secondary | ICD-10-CM | POA: Diagnosis not present

## 2021-08-20 DIAGNOSIS — J329 Chronic sinusitis, unspecified: Secondary | ICD-10-CM | POA: Diagnosis not present

## 2021-08-20 DIAGNOSIS — D510 Vitamin B12 deficiency anemia due to intrinsic factor deficiency: Secondary | ICD-10-CM | POA: Diagnosis not present

## 2021-08-20 DIAGNOSIS — M1991 Primary osteoarthritis, unspecified site: Secondary | ICD-10-CM | POA: Diagnosis not present

## 2021-08-20 DIAGNOSIS — H6122 Impacted cerumen, left ear: Secondary | ICD-10-CM | POA: Diagnosis not present

## 2021-08-20 DIAGNOSIS — D696 Thrombocytopenia, unspecified: Secondary | ICD-10-CM | POA: Diagnosis not present

## 2021-08-20 DIAGNOSIS — I10 Essential (primary) hypertension: Secondary | ICD-10-CM | POA: Diagnosis not present

## 2021-08-20 DIAGNOSIS — H539 Unspecified visual disturbance: Secondary | ICD-10-CM | POA: Diagnosis not present

## 2021-08-20 DIAGNOSIS — Z6822 Body mass index (BMI) 22.0-22.9, adult: Secondary | ICD-10-CM | POA: Diagnosis not present

## 2021-09-25 DIAGNOSIS — G894 Chronic pain syndrome: Secondary | ICD-10-CM | POA: Diagnosis not present

## 2021-09-25 DIAGNOSIS — I10 Essential (primary) hypertension: Secondary | ICD-10-CM | POA: Diagnosis not present

## 2021-09-25 DIAGNOSIS — F419 Anxiety disorder, unspecified: Secondary | ICD-10-CM | POA: Diagnosis not present

## 2021-09-25 DIAGNOSIS — M1991 Primary osteoarthritis, unspecified site: Secondary | ICD-10-CM | POA: Diagnosis not present

## 2021-10-13 DIAGNOSIS — I10 Essential (primary) hypertension: Secondary | ICD-10-CM | POA: Diagnosis not present

## 2021-10-13 DIAGNOSIS — G894 Chronic pain syndrome: Secondary | ICD-10-CM | POA: Diagnosis not present

## 2021-10-13 DIAGNOSIS — M1991 Primary osteoarthritis, unspecified site: Secondary | ICD-10-CM | POA: Diagnosis not present

## 2021-11-06 DIAGNOSIS — I10 Essential (primary) hypertension: Secondary | ICD-10-CM | POA: Diagnosis not present

## 2021-11-06 DIAGNOSIS — G894 Chronic pain syndrome: Secondary | ICD-10-CM | POA: Diagnosis not present

## 2021-11-06 DIAGNOSIS — E538 Deficiency of other specified B group vitamins: Secondary | ICD-10-CM | POA: Diagnosis not present

## 2021-11-06 DIAGNOSIS — Z23 Encounter for immunization: Secondary | ICD-10-CM | POA: Diagnosis not present

## 2021-11-06 DIAGNOSIS — Z6822 Body mass index (BMI) 22.0-22.9, adult: Secondary | ICD-10-CM | POA: Diagnosis not present

## 2021-11-06 DIAGNOSIS — M1991 Primary osteoarthritis, unspecified site: Secondary | ICD-10-CM | POA: Diagnosis not present

## 2021-12-18 DIAGNOSIS — Z0100 Encounter for examination of eyes and vision without abnormal findings: Secondary | ICD-10-CM | POA: Diagnosis not present

## 2021-12-18 DIAGNOSIS — H5210 Myopia, unspecified eye: Secondary | ICD-10-CM | POA: Diagnosis not present

## 2022-01-05 DIAGNOSIS — I10 Essential (primary) hypertension: Secondary | ICD-10-CM | POA: Diagnosis not present

## 2022-01-05 DIAGNOSIS — E538 Deficiency of other specified B group vitamins: Secondary | ICD-10-CM | POA: Diagnosis not present

## 2022-01-05 DIAGNOSIS — M1991 Primary osteoarthritis, unspecified site: Secondary | ICD-10-CM | POA: Diagnosis not present

## 2022-01-05 DIAGNOSIS — G894 Chronic pain syndrome: Secondary | ICD-10-CM | POA: Diagnosis not present

## 2022-02-09 DIAGNOSIS — M1991 Primary osteoarthritis, unspecified site: Secondary | ICD-10-CM | POA: Diagnosis not present

## 2022-02-09 DIAGNOSIS — I10 Essential (primary) hypertension: Secondary | ICD-10-CM | POA: Diagnosis not present

## 2022-02-09 DIAGNOSIS — G894 Chronic pain syndrome: Secondary | ICD-10-CM | POA: Diagnosis not present

## 2022-03-12 DIAGNOSIS — E538 Deficiency of other specified B group vitamins: Secondary | ICD-10-CM | POA: Diagnosis not present

## 2022-04-17 DIAGNOSIS — Z6822 Body mass index (BMI) 22.0-22.9, adult: Secondary | ICD-10-CM | POA: Diagnosis not present

## 2022-04-17 DIAGNOSIS — N183 Chronic kidney disease, stage 3 unspecified: Secondary | ICD-10-CM | POA: Diagnosis not present

## 2022-04-17 DIAGNOSIS — E538 Deficiency of other specified B group vitamins: Secondary | ICD-10-CM | POA: Diagnosis not present

## 2022-04-17 DIAGNOSIS — I10 Essential (primary) hypertension: Secondary | ICD-10-CM | POA: Diagnosis not present

## 2022-04-17 DIAGNOSIS — R3129 Other microscopic hematuria: Secondary | ICD-10-CM | POA: Diagnosis not present

## 2022-04-17 DIAGNOSIS — M1991 Primary osteoarthritis, unspecified site: Secondary | ICD-10-CM | POA: Diagnosis not present

## 2022-04-17 DIAGNOSIS — G894 Chronic pain syndrome: Secondary | ICD-10-CM | POA: Diagnosis not present

## 2022-05-14 DIAGNOSIS — G894 Chronic pain syndrome: Secondary | ICD-10-CM | POA: Diagnosis not present

## 2022-05-14 DIAGNOSIS — E538 Deficiency of other specified B group vitamins: Secondary | ICD-10-CM | POA: Diagnosis not present

## 2022-05-14 DIAGNOSIS — I10 Essential (primary) hypertension: Secondary | ICD-10-CM | POA: Diagnosis not present

## 2022-05-14 DIAGNOSIS — M1991 Primary osteoarthritis, unspecified site: Secondary | ICD-10-CM | POA: Diagnosis not present

## 2022-06-11 DIAGNOSIS — I10 Essential (primary) hypertension: Secondary | ICD-10-CM | POA: Diagnosis not present

## 2022-06-11 DIAGNOSIS — E538 Deficiency of other specified B group vitamins: Secondary | ICD-10-CM | POA: Diagnosis not present

## 2022-06-11 DIAGNOSIS — N183 Chronic kidney disease, stage 3 unspecified: Secondary | ICD-10-CM | POA: Diagnosis not present

## 2022-06-11 DIAGNOSIS — G894 Chronic pain syndrome: Secondary | ICD-10-CM | POA: Diagnosis not present

## 2022-07-08 DIAGNOSIS — G894 Chronic pain syndrome: Secondary | ICD-10-CM | POA: Diagnosis not present

## 2022-07-08 DIAGNOSIS — N183 Chronic kidney disease, stage 3 unspecified: Secondary | ICD-10-CM | POA: Diagnosis not present

## 2022-07-08 DIAGNOSIS — E538 Deficiency of other specified B group vitamins: Secondary | ICD-10-CM | POA: Diagnosis not present

## 2022-07-08 DIAGNOSIS — I10 Essential (primary) hypertension: Secondary | ICD-10-CM | POA: Diagnosis not present

## 2022-09-17 DIAGNOSIS — Z1331 Encounter for screening for depression: Secondary | ICD-10-CM | POA: Diagnosis not present

## 2022-09-17 DIAGNOSIS — Z0001 Encounter for general adult medical examination with abnormal findings: Secondary | ICD-10-CM | POA: Diagnosis not present

## 2022-09-17 DIAGNOSIS — G894 Chronic pain syndrome: Secondary | ICD-10-CM | POA: Diagnosis not present

## 2022-09-17 DIAGNOSIS — M1991 Primary osteoarthritis, unspecified site: Secondary | ICD-10-CM | POA: Diagnosis not present

## 2022-09-17 DIAGNOSIS — N183 Chronic kidney disease, stage 3 unspecified: Secondary | ICD-10-CM | POA: Diagnosis not present

## 2022-09-17 DIAGNOSIS — I10 Essential (primary) hypertension: Secondary | ICD-10-CM | POA: Diagnosis not present

## 2022-09-17 DIAGNOSIS — E538 Deficiency of other specified B group vitamins: Secondary | ICD-10-CM | POA: Diagnosis not present

## 2022-09-17 DIAGNOSIS — Z23 Encounter for immunization: Secondary | ICD-10-CM | POA: Diagnosis not present

## 2022-09-17 DIAGNOSIS — Z125 Encounter for screening for malignant neoplasm of prostate: Secondary | ICD-10-CM | POA: Diagnosis not present

## 2022-09-17 DIAGNOSIS — D518 Other vitamin B12 deficiency anemias: Secondary | ICD-10-CM | POA: Diagnosis not present

## 2022-10-29 DIAGNOSIS — I10 Essential (primary) hypertension: Secondary | ICD-10-CM | POA: Diagnosis not present

## 2022-10-29 DIAGNOSIS — G894 Chronic pain syndrome: Secondary | ICD-10-CM | POA: Diagnosis not present

## 2022-10-29 DIAGNOSIS — M1991 Primary osteoarthritis, unspecified site: Secondary | ICD-10-CM | POA: Diagnosis not present

## 2022-10-29 DIAGNOSIS — N183 Chronic kidney disease, stage 3 unspecified: Secondary | ICD-10-CM | POA: Diagnosis not present

## 2022-12-09 DIAGNOSIS — G894 Chronic pain syndrome: Secondary | ICD-10-CM | POA: Diagnosis not present

## 2022-12-09 DIAGNOSIS — M1991 Primary osteoarthritis, unspecified site: Secondary | ICD-10-CM | POA: Diagnosis not present

## 2022-12-09 DIAGNOSIS — N183 Chronic kidney disease, stage 3 unspecified: Secondary | ICD-10-CM | POA: Diagnosis not present

## 2022-12-09 DIAGNOSIS — I10 Essential (primary) hypertension: Secondary | ICD-10-CM | POA: Diagnosis not present

## 2023-01-08 DIAGNOSIS — G894 Chronic pain syndrome: Secondary | ICD-10-CM | POA: Diagnosis not present

## 2023-01-08 DIAGNOSIS — N183 Chronic kidney disease, stage 3 unspecified: Secondary | ICD-10-CM | POA: Diagnosis not present

## 2023-01-08 DIAGNOSIS — M1991 Primary osteoarthritis, unspecified site: Secondary | ICD-10-CM | POA: Diagnosis not present

## 2023-01-08 DIAGNOSIS — I10 Essential (primary) hypertension: Secondary | ICD-10-CM | POA: Diagnosis not present

## 2023-02-25 DIAGNOSIS — H25813 Combined forms of age-related cataract, bilateral: Secondary | ICD-10-CM | POA: Diagnosis not present

## 2023-03-12 DIAGNOSIS — G894 Chronic pain syndrome: Secondary | ICD-10-CM | POA: Diagnosis not present

## 2023-03-12 DIAGNOSIS — N183 Chronic kidney disease, stage 3 unspecified: Secondary | ICD-10-CM | POA: Diagnosis not present

## 2023-03-12 DIAGNOSIS — Z6823 Body mass index (BMI) 23.0-23.9, adult: Secondary | ICD-10-CM | POA: Diagnosis not present

## 2023-03-12 DIAGNOSIS — E538 Deficiency of other specified B group vitamins: Secondary | ICD-10-CM | POA: Diagnosis not present

## 2023-03-12 DIAGNOSIS — I10 Essential (primary) hypertension: Secondary | ICD-10-CM | POA: Diagnosis not present

## 2023-03-12 DIAGNOSIS — M1991 Primary osteoarthritis, unspecified site: Secondary | ICD-10-CM | POA: Diagnosis not present

## 2023-03-12 DIAGNOSIS — Z1331 Encounter for screening for depression: Secondary | ICD-10-CM | POA: Diagnosis not present

## 2023-03-30 DIAGNOSIS — H25813 Combined forms of age-related cataract, bilateral: Secondary | ICD-10-CM | POA: Diagnosis not present

## 2023-03-30 DIAGNOSIS — H40003 Preglaucoma, unspecified, bilateral: Secondary | ICD-10-CM | POA: Diagnosis not present

## 2023-03-30 DIAGNOSIS — H35363 Drusen (degenerative) of macula, bilateral: Secondary | ICD-10-CM | POA: Diagnosis not present
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
## 2023-05-04 ENCOUNTER — Encounter (HOSPITAL_COMMUNITY): Payer: PRIVATE HEALTH INSURANCE

## 2023-05-07 ENCOUNTER — Ambulatory Visit: Admit: 2023-05-07 | Payer: PRIVATE HEALTH INSURANCE | Admitting: Optometry

## 2023-05-07 SURGERY — PHACOEMULSIFICATION, CATARACT, WITH IOL INSERTION
Anesthesia: Monitor Anesthesia Care | Laterality: Left

## 2023-05-31 DIAGNOSIS — N183 Chronic kidney disease, stage 3 unspecified: Secondary | ICD-10-CM | POA: Diagnosis not present

## 2023-05-31 DIAGNOSIS — G894 Chronic pain syndrome: Secondary | ICD-10-CM | POA: Diagnosis not present

## 2023-05-31 DIAGNOSIS — M1991 Primary osteoarthritis, unspecified site: Secondary | ICD-10-CM | POA: Diagnosis not present

## 2023-05-31 DIAGNOSIS — I10 Essential (primary) hypertension: Secondary | ICD-10-CM | POA: Diagnosis not present

## 2023-07-06 DIAGNOSIS — I10 Essential (primary) hypertension: Secondary | ICD-10-CM | POA: Diagnosis not present

## 2023-07-06 DIAGNOSIS — M1991 Primary osteoarthritis, unspecified site: Secondary | ICD-10-CM | POA: Diagnosis not present

## 2023-07-06 DIAGNOSIS — N183 Chronic kidney disease, stage 3 unspecified: Secondary | ICD-10-CM | POA: Diagnosis not present

## 2023-07-06 DIAGNOSIS — G894 Chronic pain syndrome: Secondary | ICD-10-CM | POA: Diagnosis not present

## 2023-07-06 DIAGNOSIS — Z6823 Body mass index (BMI) 23.0-23.9, adult: Secondary | ICD-10-CM | POA: Diagnosis not present

## 2023-07-06 DIAGNOSIS — E538 Deficiency of other specified B group vitamins: Secondary | ICD-10-CM | POA: Diagnosis not present

## 2023-07-29 ENCOUNTER — Emergency Department (HOSPITAL_COMMUNITY): Payer: Medicare HMO

## 2023-07-29 ENCOUNTER — Inpatient Hospital Stay (HOSPITAL_COMMUNITY)
Admission: EM | Admit: 2023-07-29 | Discharge: 2023-08-01 | DRG: 871 | Disposition: A | Discharge status: Home Health | Payer: Medicare HMO | Attending: Family Medicine | Admitting: Family Medicine

## 2023-07-29 ENCOUNTER — Other Ambulatory Visit: Payer: Self-pay

## 2023-07-29 ENCOUNTER — Encounter (HOSPITAL_COMMUNITY): Payer: Self-pay

## 2023-07-29 DIAGNOSIS — Z96642 Presence of left artificial hip joint: Secondary | ICD-10-CM | POA: Diagnosis present

## 2023-07-29 DIAGNOSIS — Z8249 Family history of ischemic heart disease and other diseases of the circulatory system: Secondary | ICD-10-CM | POA: Diagnosis not present

## 2023-07-29 DIAGNOSIS — R6520 Severe sepsis without septic shock: Secondary | ICD-10-CM | POA: Diagnosis not present

## 2023-07-29 DIAGNOSIS — Z9049 Acquired absence of other specified parts of digestive tract: Secondary | ICD-10-CM | POA: Diagnosis not present

## 2023-07-29 DIAGNOSIS — A4189 Other specified sepsis: Secondary | ICD-10-CM | POA: Diagnosis not present

## 2023-07-29 DIAGNOSIS — J9601 Acute respiratory failure with hypoxia: Secondary | ICD-10-CM | POA: Diagnosis not present

## 2023-07-29 DIAGNOSIS — K219 Gastro-esophageal reflux disease without esophagitis: Secondary | ICD-10-CM | POA: Diagnosis not present

## 2023-07-29 DIAGNOSIS — I129 Hypertensive chronic kidney disease with stage 1 through stage 4 chronic kidney disease, or unspecified chronic kidney disease: Secondary | ICD-10-CM | POA: Diagnosis present

## 2023-07-29 DIAGNOSIS — Z28310 Unvaccinated for covid-19: Secondary | ICD-10-CM

## 2023-07-29 DIAGNOSIS — F1120 Opioid dependence, uncomplicated: Secondary | ICD-10-CM | POA: Diagnosis not present

## 2023-07-29 DIAGNOSIS — Z87891 Personal history of nicotine dependence: Secondary | ICD-10-CM

## 2023-07-29 DIAGNOSIS — N400 Enlarged prostate without lower urinary tract symptoms: Secondary | ICD-10-CM | POA: Diagnosis present

## 2023-07-29 DIAGNOSIS — R0902 Hypoxemia: Secondary | ICD-10-CM

## 2023-07-29 DIAGNOSIS — N179 Acute kidney failure, unspecified: Secondary | ICD-10-CM | POA: Diagnosis present

## 2023-07-29 DIAGNOSIS — Z955 Presence of coronary angioplasty implant and graft: Secondary | ICD-10-CM | POA: Diagnosis not present

## 2023-07-29 DIAGNOSIS — Z7902 Long term (current) use of antithrombotics/antiplatelets: Secondary | ICD-10-CM | POA: Diagnosis not present

## 2023-07-29 DIAGNOSIS — E785 Hyperlipidemia, unspecified: Secondary | ICD-10-CM | POA: Diagnosis not present

## 2023-07-29 DIAGNOSIS — M1991 Primary osteoarthritis, unspecified site: Secondary | ICD-10-CM | POA: Diagnosis not present

## 2023-07-29 DIAGNOSIS — I2510 Atherosclerotic heart disease of native coronary artery without angina pectoris: Secondary | ICD-10-CM | POA: Diagnosis present

## 2023-07-29 DIAGNOSIS — I209 Angina pectoris, unspecified: Secondary | ICD-10-CM | POA: Diagnosis not present

## 2023-07-29 DIAGNOSIS — A419 Sepsis, unspecified organism: Secondary | ICD-10-CM | POA: Insufficient documentation

## 2023-07-29 DIAGNOSIS — R918 Other nonspecific abnormal finding of lung field: Secondary | ICD-10-CM | POA: Diagnosis not present

## 2023-07-29 DIAGNOSIS — G8929 Other chronic pain: Secondary | ICD-10-CM | POA: Diagnosis present

## 2023-07-29 DIAGNOSIS — J984 Other disorders of lung: Secondary | ICD-10-CM | POA: Diagnosis not present

## 2023-07-29 DIAGNOSIS — J189 Pneumonia, unspecified organism: Secondary | ICD-10-CM

## 2023-07-29 DIAGNOSIS — U071 COVID-19: Secondary | ICD-10-CM | POA: Diagnosis not present

## 2023-07-29 DIAGNOSIS — N1831 Chronic kidney disease, stage 3a: Secondary | ICD-10-CM | POA: Diagnosis present

## 2023-07-29 DIAGNOSIS — R609 Edema, unspecified: Secondary | ICD-10-CM | POA: Diagnosis not present

## 2023-07-29 DIAGNOSIS — R531 Weakness: Secondary | ICD-10-CM | POA: Diagnosis not present

## 2023-07-29 DIAGNOSIS — R509 Fever, unspecified: Secondary | ICD-10-CM | POA: Diagnosis not present

## 2023-07-29 DIAGNOSIS — J1282 Pneumonia due to coronavirus disease 2019: Secondary | ICD-10-CM | POA: Diagnosis not present

## 2023-07-29 DIAGNOSIS — I10 Essential (primary) hypertension: Secondary | ICD-10-CM | POA: Diagnosis not present

## 2023-07-29 DIAGNOSIS — R0600 Dyspnea, unspecified: Secondary | ICD-10-CM | POA: Diagnosis not present

## 2023-07-29 DIAGNOSIS — I700 Atherosclerosis of aorta: Secondary | ICD-10-CM | POA: Diagnosis not present

## 2023-07-29 DIAGNOSIS — J440 Chronic obstructive pulmonary disease with acute lower respiratory infection: Secondary | ICD-10-CM | POA: Diagnosis present

## 2023-07-29 DIAGNOSIS — G894 Chronic pain syndrome: Secondary | ICD-10-CM | POA: Diagnosis not present

## 2023-07-29 DIAGNOSIS — N183 Chronic kidney disease, stage 3 unspecified: Secondary | ICD-10-CM | POA: Diagnosis present

## 2023-07-29 DIAGNOSIS — R000 Tachycardia, unspecified: Secondary | ICD-10-CM | POA: Diagnosis not present

## 2023-07-29 LAB — COMPREHENSIVE METABOLIC PANEL WITH GFR
ALT: 17 U/L (ref 0–44)
AST: 32 U/L (ref 15–41)
Albumin: 3.3 g/dL — ABNORMAL LOW (ref 3.5–5.0)
Alkaline Phosphatase: 63 U/L (ref 38–126)
Anion gap: 9 (ref 5–15)
BUN: 24 mg/dL — ABNORMAL HIGH (ref 8–23)
CO2: 26 mmol/L (ref 22–32)
Calcium: 8.6 mg/dL — ABNORMAL LOW (ref 8.9–10.3)
Chloride: 93 mmol/L — ABNORMAL LOW (ref 98–111)
Creatinine, Ser: 1.7 mg/dL — ABNORMAL HIGH (ref 0.61–1.24)
GFR, Estimated: 40 mL/min — ABNORMAL LOW
Glucose, Bld: 123 mg/dL — ABNORMAL HIGH (ref 70–99)
Potassium: 4.2 mmol/L (ref 3.5–5.1)
Sodium: 128 mmol/L — ABNORMAL LOW (ref 135–145)
Total Bilirubin: 0.8 mg/dL (ref 0.3–1.2)
Total Protein: 7.6 g/dL (ref 6.5–8.1)

## 2023-07-29 LAB — URINALYSIS, W/ REFLEX TO CULTURE (INFECTION SUSPECTED)
Bilirubin Urine: NEGATIVE
Glucose, UA: NEGATIVE mg/dL
Ketones, ur: NEGATIVE mg/dL
Leukocytes,Ua: NEGATIVE
Nitrite: NEGATIVE
Protein, ur: NEGATIVE mg/dL
Specific Gravity, Urine: 1.005 (ref 1.005–1.030)
pH: 5 (ref 5.0–8.0)

## 2023-07-29 LAB — CBC WITH DIFFERENTIAL/PLATELET
Abs Immature Granulocytes: 0.08 K/uL — ABNORMAL HIGH (ref 0.00–0.07)
Basophils Absolute: 0.1 K/uL (ref 0.0–0.1)
Basophils Relative: 1 %
Eosinophils Absolute: 0 K/uL (ref 0.0–0.5)
Eosinophils Relative: 0 %
HCT: 32 % — ABNORMAL LOW (ref 39.0–52.0)
Hemoglobin: 10.7 g/dL — ABNORMAL LOW (ref 13.0–17.0)
Immature Granulocytes: 1 %
Lymphocytes Relative: 3 %
Lymphs Abs: 0.3 K/uL — ABNORMAL LOW (ref 0.7–4.0)
MCH: 30.5 pg (ref 26.0–34.0)
MCHC: 33.4 g/dL (ref 30.0–36.0)
MCV: 91.2 fL (ref 80.0–100.0)
Monocytes Absolute: 0.9 K/uL (ref 0.1–1.0)
Monocytes Relative: 9 %
Neutro Abs: 8.6 K/uL — ABNORMAL HIGH (ref 1.7–7.7)
Neutrophils Relative %: 86 %
Platelets: 135 K/uL — ABNORMAL LOW (ref 150–400)
RBC: 3.51 MIL/uL — ABNORMAL LOW (ref 4.22–5.81)
RDW: 15.1 % (ref 11.5–15.5)
WBC: 9.9 K/uL (ref 4.0–10.5)
nRBC: 0 % (ref 0.0–0.2)

## 2023-07-29 LAB — RESP PANEL BY RT-PCR (RSV, FLU A&B, COVID)  RVPGX2
Influenza A by PCR: NEGATIVE
Influenza B by PCR: NEGATIVE
Resp Syncytial Virus by PCR: NEGATIVE
SARS Coronavirus 2 by RT PCR: POSITIVE — AB

## 2023-07-29 LAB — PROCALCITONIN: Procalcitonin: 0.31 ng/mL

## 2023-07-29 LAB — CULTURE, BLOOD (ROUTINE X 2): Special Requests: ADEQUATE

## 2023-07-29 LAB — LACTIC ACID, PLASMA
Lactic Acid, Venous: 1.8 mmol/L (ref 0.5–1.9)
Lactic Acid, Venous: 1.2 mmol/L (ref 0.5–1.9)

## 2023-07-29 LAB — APTT: aPTT: 31 s (ref 24–36)

## 2023-07-29 LAB — BRAIN NATRIURETIC PEPTIDE: B Natriuretic Peptide: 191 pg/mL — ABNORMAL HIGH (ref 0.0–100.0)

## 2023-07-29 MED ORDER — SODIUM CHLORIDE 0.9 % IV SOLN
500.0000 mg | INTRAVENOUS | Status: DC
  Administered 2023-07-29: 500 mg via INTRAVENOUS
  Filled 2023-07-29: qty 5

## 2023-07-29 MED ORDER — SODIUM CHLORIDE 0.9 % IV SOLN
100.0000 mg | INTRAVENOUS | Status: DC
  Administered 2023-07-29: 100 mg via INTRAVENOUS
  Filled 2023-07-29 (×2): qty 20

## 2023-07-29 MED ORDER — SODIUM CHLORIDE 0.9 % IV SOLN: 200.0000 mg | Freq: Once | INTRAVENOUS | Status: DC

## 2023-07-29 MED ORDER — SODIUM CHLORIDE 0.9 % IV SOLN
2.0000 g | INTRAVENOUS | Status: DC
  Administered 2023-07-29: 2 g via INTRAVENOUS
  Filled 2023-07-29: qty 20

## 2023-07-29 MED ORDER — ACETAMINOPHEN 325 MG PO TABS: 650.0000 mg | ORAL_TABLET | Freq: Four times a day (QID) | ORAL | Status: DC | PRN

## 2023-07-29 MED ORDER — OXYCODONE HCL 5 MG PO TABS
5.0000 mg | ORAL_TABLET | ORAL | Status: DC | PRN
  Administered 2023-07-30: 5 mg via ORAL
  Filled 2023-07-29: qty 1

## 2023-07-29 MED ORDER — ACETAMINOPHEN 325 MG PO TABS
650.0000 mg | ORAL_TABLET | Freq: Once | ORAL | Status: AC | PRN
  Administered 2023-07-29: 650 mg via ORAL
  Filled 2023-07-29: qty 2

## 2023-07-29 MED ORDER — CLOPIDOGREL BISULFATE 75 MG PO TABS
75.0000 mg | ORAL_TABLET | Freq: Every day | ORAL | Status: DC
  Administered 2023-07-30 – 2023-08-01 (×3): 75 mg via ORAL
  Filled 2023-07-29 (×3): qty 1

## 2023-07-29 MED ORDER — ONDANSETRON HCL 4 MG/2ML IJ SOLN: 4.0000 mg | Freq: Four times a day (QID) | INTRAMUSCULAR | Status: DC | PRN

## 2023-07-29 MED ORDER — DEXAMETHASONE SODIUM PHOSPHATE 10 MG/ML IJ SOLN
6.0000 mg | Freq: Once | INTRAMUSCULAR | Status: AC
  Administered 2023-07-29: 6 mg via INTRAVENOUS
  Filled 2023-07-29: qty 1

## 2023-07-29 MED ORDER — SODIUM CHLORIDE 0.9 % IV SOLN: 100.0000 mg | Freq: Every day | INTRAVENOUS | Status: DC

## 2023-07-29 MED ORDER — SODIUM CHLORIDE 0.9 % IV SOLN
100.0000 mg | INTRAVENOUS | Status: DC
  Filled 2023-07-29: qty 20

## 2023-07-29 MED ORDER — ASPIRIN 81 MG PO TBEC
81.0000 mg | DELAYED_RELEASE_TABLET | Freq: Every day | ORAL | Status: DC
  Administered 2023-07-30 – 2023-08-01 (×3): 81 mg via ORAL
  Filled 2023-07-29 (×3): qty 1

## 2023-07-29 MED ORDER — PREDNISONE 20 MG PO TABS: 50.0000 mg | ORAL_TABLET | Freq: Every day | ORAL | Status: DC

## 2023-07-29 MED ORDER — HEPARIN SODIUM (PORCINE) 5000 UNIT/ML IJ SOLN
5000.0000 [IU] | Freq: Three times a day (TID) | INTRAMUSCULAR | Status: DC
  Administered 2023-07-29 – 2023-08-01 (×9): 5000 [IU] via SUBCUTANEOUS
  Filled 2023-07-29 (×9): qty 1

## 2023-07-29 MED ORDER — METHYLPREDNISOLONE SODIUM SUCC 125 MG IJ SOLR
1.0000 mg/kg | INTRAMUSCULAR | Status: AC
  Administered 2023-07-30 – 2023-08-01 (×3): 76.25 mg via INTRAVENOUS
  Filled 2023-07-29 (×3): qty 2

## 2023-07-29 MED ORDER — LACTATED RINGERS IV BOLUS
500.0000 mL | Freq: Once | INTRAVENOUS | Status: AC
  Administered 2023-07-29: 500 mL via INTRAVENOUS

## 2023-07-29 MED ORDER — ONDANSETRON HCL 4 MG PO TABS: 4.0000 mg | ORAL_TABLET | Freq: Four times a day (QID) | ORAL | Status: DC | PRN

## 2023-07-29 MED ORDER — ACETAMINOPHEN 650 MG RE SUPP: 650.0000 mg | Freq: Four times a day (QID) | RECTAL | Status: DC | PRN

## 2023-07-29 NOTE — ED Triage Notes (Addendum)
Pt brought in by [facility] for fever, weakness, low oxygen, bilateral rhales in lower lobes. Pt states weakness started Tuesday last week. Per EMS family informed they thought it was heat exhaustion but pt has become weaker since. EMS got a temperature 103 oral. EMS states urine is dark in color per family.

## 2023-07-29 NOTE — ED Provider Notes (Signed)
[place] EMERGENCY DEPARTMENT AT [facility] Provider Note   CSN: [id number] Arrival date & time: 07/29/23  1750     History  Chief Complaint  Patient presents with   Weakness    [name]. is a 82 y.o. male.  82 year old male with a history of CAD on aspirin and Plavix, hypertension, and CKD who presents to the emergency department with shortness of breath, cough, and fevers for 9 days.  Also has been having chest pain while coughing but no chest pain at rest.  Says he is having some dysuria as well.  Denies any chest pain otherwise.  Not on home oxygen.       Home Medications Prior to Admission medications   Medication Sig Start Date End Date Taking? Authorizing Provider  aspirin EC 81 MG tablet Take by mouth.   Yes [provider]  cholecalciferol (VITAMIN D) 1000 units tablet Take 5,000 Units by mouth daily.   Yes [provider]  clopidogrel (PLAVIX) 75 MG tablet Take 75 mg by mouth daily. 1 Tablet Daily   Yes [provider]  Cyanocobalamin (VITAMIN B-12 IJ) Inject 1 mL as directed every 30 (thirty) days.    Yes [provider]  nitroGLYCERIN (NITROSTAT) 0.4 MG SL tablet Place 0.4 mg under the tongue every 5 (five) minutes as needed.  06/23/17  Yes [provider]  oxyCODONE (ROXICODONE) 15 MG immediate release tablet Take 1 tablet (15 mg total) by mouth every 4 (four) hours as needed. He takes 3-4 times a day as needed Patient taking differently: Take 30 mg by mouth every 6 (six) hours as needed for pain. He takes 3-4 times a day as needed 06/29/19  Yes Teryl Lucy, MD      Allergies    Crestor [rosuvastatin] and Paroxetine    Review of Systems   Review of Systems  Physical Exam Updated Vital Signs BP 122/60 (BP Location: Left Arm)   Pulse 74   Temp (!) 97.4 F (36.3 C) (Oral)   Resp 20   Ht 6\' 2"  (1.88 m)   Wt 76.2 kg   SpO2 90%   BMI 21.57 kg/m  Physical Exam Vitals and nursing note reviewed.   Constitutional:      General: He is not in acute distress.    Appearance: He is well-developed.     Comments: On 3 L nasal cannula new oxygen requirement  HENT:     Head: Normocephalic and atraumatic.     Right Ear: External ear normal.     Left Ear: External ear normal.     Nose: Nose normal.  Eyes:     Extraocular Movements: Extraocular movements intact.     Conjunctiva/sclera: Conjunctivae normal.     Pupils: Pupils are equal, round, and reactive to light.  Cardiovascular:     Rate and Rhythm: Regular rhythm. Tachycardia present.     Heart sounds: Normal heart sounds.  Pulmonary:     Effort: Pulmonary effort is normal. No respiratory distress.     Breath sounds: Rales (Bibasilar) present.  Abdominal:     General: There is no distension.     Palpations: Abdomen is soft. There is no mass.     Tenderness: There is no abdominal tenderness. There is no guarding.  Musculoskeletal:     Cervical back: Normal range of motion and neck supple.     Right lower leg: Edema present.     Left lower leg: Edema present.  Comments: Trace bilateral lower extremity edema  Skin:    General: Skin is warm and dry.  Neurological:     Mental Status: He is alert. Mental status is at baseline.  Psychiatric:        Mood and Affect: Mood normal.        Behavior: Behavior normal.     ED Results / Procedures / Treatments   Labs (all labs ordered are listed, but only abnormal results are displayed) Labs Reviewed  RESP PANEL BY RT-PCR (RSV, FLU A&B, COVID)  RVPGX2 - Abnormal; Notable for the following components:      Result Value   SARS Coronavirus 2 by RT PCR POSITIVE (*)    All other components within normal limits  COMPREHENSIVE METABOLIC PANEL - Abnormal; Notable for the following components:   Sodium 128 (*)    Chloride 93 (*)    Glucose, Bld 123 (*)    BUN 24 (*)    Creatinine, Ser 1.70 (*)    Calcium 8.6 (*)    Albumin 3.3 (*)    GFR, Estimated 40 (*)    All other components  within normal limits  CBC WITH DIFFERENTIAL/PLATELET - Abnormal; Notable for the following components:   RBC 3.51 (*)    Hemoglobin 10.7 (*)    HCT 32.0 (*)    Platelets 135 (*)    Neutro Abs 8.6 (*)    Lymphs Abs 0.3 (*)    Abs Immature Granulocytes 0.08 (*)    All other components within normal limits  BRAIN NATRIURETIC PEPTIDE - Abnormal; Notable for the following components:   B Natriuretic Peptide 191.0 (*)    All other components within normal limits  URINALYSIS, W/ REFLEX TO CULTURE (INFECTION SUSPECTED) - Abnormal; Notable for the following components:   Hgb urine dipstick MODERATE (*)    Bacteria, UA RARE (*)    All other components within normal limits  COMPREHENSIVE METABOLIC PANEL - Abnormal; Notable for the following components:   Sodium 133 (*)    Glucose, Bld 147 (*)    Creatinine, Ser 1.53 (*)    Calcium 8.7 (*)    Albumin 3.0 (*)    GFR, Estimated 45 (*)    All other components within normal limits  MAGNESIUM - Abnormal; Notable for the following components:   Magnesium 2.7 (*)    All other components within normal limits  CBC WITH DIFFERENTIAL/PLATELET - Abnormal; Notable for the following components:   RBC 3.47 (*)    Hemoglobin 10.5 (*)    HCT 32.1 (*)    Platelets 117 (*)    Lymphs Abs 0.5 (*)    All other components within normal limits  CULTURE, BLOOD (ROUTINE X 2)  CULTURE, BLOOD (ROUTINE X 2)  EXPECTORATED SPUTUM ASSESSMENT W GRAM STAIN, RFLX TO RESP C  LACTIC ACID, PLASMA  LACTIC ACID, PLASMA  APTT  PROCALCITONIN  STREP PNEUMONIAE URINARY ANTIGEN  LEGIONELLA PNEUMOPHILA SEROGP 1 UR AG    EKG EKG Interpretation Date/Time:  Thursday July 29 2023 18:08:11 EDT Ventricular Rate:  103 PR Interval:  117 QRS Duration:  89 QT Interval:  326 QTC Calculation: 427 R Axis:   58  Text Interpretation: Interpretation limited secondary to artifact tachycardia Atrial premature complex Artifact in lead(s) I II aVR aVL aVF Confirmed by Vonita Moss  [phone]) on 07/29/2023 6:28:22 PM  Radiology DG Chest Port 1 View  Result Date: 07/29/2023 CLINICAL DATA:  [id number] Fever [id number] EXAM: PORTABLE CHEST 1 VIEW COMPARISON:  CT chest  09/07/2019, chest x-ray 06/28/2019 FINDINGS: The heart and mediastinal contours are unchanged. Aortic calcification. Retrocardiac airspace opacity. Chronic coarsened interstitial markings with no overt pulmonary edema. No pleural effusion. No pneumothorax. No acute osseous abnormality. IMPRESSION: 1. Retrocardiac airspace opacity that may represent a combination of atelectasis and infection/inflammation. Followup PA and lateral chest X-ray is recommended in 3-4 weeks following therapy to ensure resolution. 2.  Aortic Atherosclerosis (ICD10-I70.0). Electronically Signed   By: Tish Frederickson M.D.   On: 07/29/2023 19:02    Procedures Procedures    Medications Ordered in ED Medications  methylPREDNISolone sodium succinate (SOLU-MEDROL) 125 mg/2 mL injection 76.25 mg (76.25 mg Intravenous Given 07/30/23 0851)    Followed by  predniSONE (DELTASONE) tablet 50 mg (has no administration in time range)  aspirin EC tablet 81 mg (81 mg Oral Given 07/30/23 0849)  clopidogrel (PLAVIX) tablet 75 mg (75 mg Oral Given 07/30/23 0850)  heparin injection 5,000 Units (5,000 Units Subcutaneous Given 07/30/23 4098)  acetaminophen (TYLENOL) tablet 650 mg (has no administration in time range)    Or  acetaminophen (TYLENOL) suppository 650 mg (has no administration in time range)  oxyCODONE (Oxy IR/ROXICODONE) immediate release tablet 5 mg (has no administration in time range)  ondansetron (ZOFRAN) tablet 4 mg (has no administration in time range)    Or  ondansetron (ZOFRAN) injection 4 mg (has no administration in time range)  lactated ringers infusion ( Intravenous New Bag/Given 07/30/23 0850)  doxycycline (VIBRA-TABS) tablet 100 mg (has no administration in time range)  acetaminophen (TYLENOL) tablet 650 mg (650 mg Oral Given 07/29/23 1821)  lactated  ringers bolus 500 mL (0 mLs Intravenous Stopped 07/29/23 2045)  dexamethasone (DECADRON) injection 6 mg (6 mg Intravenous Given 07/29/23 1950)    ED Course/ Medical Decision Making/ A&P Clinical Course as of 07/30/23 1210  Thu Jul 29, 2023  1926 DG Chest Maurice 1 View Retrocardiac opacity concerning for infection [RP]    Clinical Course User Index [RP] Rondel Baton, MD                                 Medical Decision Making Amount and/or Complexity of Data Reviewed Labs: ordered. Radiology: ordered. Decision-making details documented in ED Course.  Risk OTC drugs. Prescription drug management. Decision regarding hospitalization.   [name]. is a 82 y.o. male with comorbidities that complicate the patient evaluation including CAD on aspirin, hypertension, and CKD who presents emergency department with shortness of breath, cough, and fevers for 9 days with a 3 L oxygen requirement  Initial Ddx:  Pneumonia, sepsis, COVID and flu, PE, MI  MDM/Course:  Patient presented to the emergency department with fever, cough, and shortness of breath for 9 days.  On arrival was hypoxic and was placed on 3 L nasal cannula.  Was febrile and tachycardic so sepsis protocol was initiated.  Suspected a respiratory source so he was given ceftriaxone and azithromycin.  Chest x-ray showed retrocardiac opacity that was concerning for infection but his COVID test came back as positive so I suspect that it is likely COVID driving his symptoms.  Was given a small bolus of fluids as well as Decadron for his COVID with new oxygen requirement.  Patient was then admitted to medicine for further management.  Of note, did consider MI and PE but is not currently having any chest pain and says that it only occurs with coughing so feel these are highly  unlikely especially with his COVID diagnosis.  Upon re-evaluation remained stable on 3 L.  This patient presents to the ED for concern of complaints listed in  HPI, this involves an extensive number of treatment options, and is a complaint that carries with it a high risk of complications and morbidity. Disposition including potential need for admission considered.   Dispo: Admit to Floor  Additional history obtained from family Records reviewed Outpatient Clinic Notes The following labs were independently interpreted: Chemistry and show AKI I independently reviewed the following imaging with scope of interpretation limited to determining acute life threatening conditions related to emergency care: Chest x-ray and agree with the radiologist interpretation with the following exceptions: none I personally reviewed and interpreted cardiac monitoring: sinus tachycardia I personally reviewed and interpreted the pt's EKG: see above for interpretation  I have reviewed the patients home medications and made adjustments as needed Consults: Hospitalist Social Determinants of health:  Elderly         Final Clinical Impression(s) / ED Diagnoses Final diagnoses:  COVID-19  Pneumonia of left lung due to infectious organism, unspecified part of lung  Sepsis, due to unspecified organism, unspecified whether acute organ dysfunction present [facility])  Hypoxia    Rx / DC Orders ED Discharge Orders     None         Rondel Baton, MD 07/30/23 1210

## 2023-07-29 NOTE — Sepsis Progress Note (Signed)
eLink is following this Code Sepsis. °

## 2023-07-30 DIAGNOSIS — N179 Acute kidney failure, unspecified: Secondary | ICD-10-CM

## 2023-07-30 DIAGNOSIS — J9601 Acute respiratory failure with hypoxia: Secondary | ICD-10-CM | POA: Diagnosis not present

## 2023-07-30 DIAGNOSIS — U071 COVID-19: Secondary | ICD-10-CM | POA: Diagnosis not present

## 2023-07-30 DIAGNOSIS — R6520 Severe sepsis without septic shock: Secondary | ICD-10-CM

## 2023-07-30 DIAGNOSIS — A419 Sepsis, unspecified organism: Secondary | ICD-10-CM

## 2023-07-30 MED ORDER — LACTATED RINGERS IV SOLN: INTRAVENOUS | Status: AC

## 2023-07-30 MED ORDER — DOXYCYCLINE HYCLATE 100 MG PO TABS
100.0000 mg | ORAL_TABLET | Freq: Two times a day (BID) | ORAL | Status: DC
  Administered 2023-07-31 – 2023-08-01 (×3): 100 mg via ORAL
  Filled 2023-07-30 (×3): qty 1

## 2023-07-30 NOTE — Progress Notes (Signed)
SATURATION QUALIFICATIONS: (This note is used to comply with regulatory documentation for home oxygen)  Patient Saturations on Room Air at Rest = 97%  Patient Saturations on Room Air while Ambulating = 80%  Patient Saturations on 2 Liters of oxygen while Ambulating = not completed due to fatigue & SOB  Please briefly explain why patient needs home oxygen: Pt with significant desaturation, increased SOB and fatigue with exertion and ambulation.

## 2023-07-30 NOTE — Progress Notes (Signed)
   07/30/23 0703  Respiratory  Respiratory Interventions Incentive spirometry w/ teach back

## 2023-07-30 NOTE — Assessment & Plan Note (Signed)
-   Likely sepsis pathology secondary to COVID - Febrile, tachycardic, tachypneic, with respiratory failure and hypoxia - COVID-positive - Chest x-ray shows questionable retrocardiac infection/inflammation - Lactic acid 1.8 - Patient started on Rocephin and Zithromax in the ED - Procalcitonin is indeterminate - Continue Rocephin and Zithromax - Also treating COVID - Blood cultures pending - Continue to monitor

## 2023-07-30 NOTE — H&P (Signed)
History and Physical    Patient: Travis Woodward. JXB:[id number] DOB: 09/27/1941 DOA: 07/29/2023 DOS: the patient was seen and examined on 07/30/2023 PCP: Elfredia Nevins, MD  Patient coming from: Home  Chief Complaint:  Chief Complaint  Patient presents with   Weakness   HPI: [name]. is a 82 y.o. male with medical history significant of CKD, CAD, hyperlipidemia, hypertension, presents to the ED with a chief complaint of cough and congestion.  Patient reports that 5 days ago he started having chest congestion with a cough productive of gray sputum.  He had no hemoptysis.  He felt feverish but did not have a measured temperature.  He denies any dyspnea, but reported generalized weakness.  He also admits to a decreased appetite.  He had no nausea, vomiting, diarrhea, abdominal pain.  Patient has had no sick contacts.  He lives with 2 other elderly people and they are able to help take care of each other.  Patient did not get his COVID-vaccine.  On review of systems patient does report that he has had some pleuritic chest pain.  It is throughout his chest that is worse when he takes a deep breath.  It is not worse with exertion necessarily.  Patient does not wear any oxygen at home.  He has no other complaints at this time.  Patient does not smoke.  Patient is full code. Review of Systems: As mentioned in the history of present illness. All other systems reviewed and are negative. Past Medical History:  Diagnosis Date   Chronic kidney disease    stage 3   Colon polyps    Complication of anesthesia    difficulty urinating    Coronary artery disease    Enlarged prostate    Hyperlipidemia    Hypertension    Past Surgical History:  Procedure Laterality Date   acd fusion & plating     BACK SURGERY     x 6   CARDIAC CATHETERIZATION  2005   CHOLECYSTECTOMY N/A 07/19/2017   Procedure: LAPAROSCOPIC CHOLECYSTECTOMY;  Surgeon: Franky Macho, MD;  Location: [facility];  Service: General;   Laterality: N/A;   CORONARY STENT PLACEMENT  2005   HEMORRHOID SURGERY     HIP ARTHROPLASTY Left 06/29/2019   Procedure: ARTHROPLASTY BIPOLAR HIP (HEMIARTHROPLASTY);  Surgeon: Teryl Lucy, MD;  Location: [facility];  Service: Orthopedics;  Laterality: Left;   LAPAROSCOPIC APPENDECTOMY  04/17/2012   Procedure: APPENDECTOMY LAPAROSCOPIC;  Surgeon: Dalia Heading, MD;  Location: [facility];  Service: General;  Laterality: N/A;   TONSILLECTOMY     Social History:  reports that he quit smoking about 51 years ago. He has never used smokeless tobacco. He reports that he does not drink alcohol and does not use drugs.  Allergies  Allergen Reactions   Crestor [Rosuvastatin]    Paroxetine Rash    Family History  Problem Relation Age of Onset   Heart attack Mother    Tuberculosis Maternal Grandfather    Adrenal disorder Neg Hx    Colon cancer Neg Hx    Stomach cancer Neg Hx    Pancreatic cancer Neg Hx    Esophageal cancer Neg Hx     Prior to Admission medications   Medication Sig Start Date End Date Taking? Authorizing Provider  aspirin EC 81 MG tablet Take by mouth.    [provider]  cholecalciferol (VITAMIN D) 1000 units tablet Take 1,000 Units by mouth daily.    [provider]  clopidogrel (PLAVIX) 75 MG tablet Take 75 mg by mouth daily. 1 Tablet Daily    [provider]  Cyanocobalamin (VITAMIN B-12 IJ) Inject 1 mL as directed every 30 (thirty) days.     [provider]  nitroGLYCERIN (NITROSTAT) 0.4 MG SL tablet Place 0.4 mg under the tongue every 5 (five) minutes as needed.  06/23/17   [provider]  oxyCODONE (ROXICODONE) 15 MG immediate release tablet Take 1 tablet (15 mg total) by mouth every 4 (four) hours as needed. He takes 3-4 times a day as needed 06/29/19   Teryl Lucy, MD    Physical Exam: Vitals:   07/29/23 2045 07/29/23 2126 07/30/23 0159 07/30/23 0554  BP: (!) 120/54 132/62 106/67 123/63  Pulse: 76 80 (!) 59 66  Resp: 14 18  16 18   Temp:  98.1 F (36.7 C) 98.5 F (36.9 C) 98.9 F (37.2 C)  TempSrc:  Oral    SpO2: 100% 96% 96% 92%  Weight:      Height:       1.  General: Patient lying supine in bed,  no acute distress   2. Psychiatric: Alert and oriented x 3, mood and behavior normal for situation, pleasant and cooperative with exam   3. Neurologic: Speech and language are normal, face is symmetric, moves all 4 extremities voluntarily, at baseline without acute deficits on limited exam   4. HEENMT:  Head is atraumatic, normocephalic, pupils reactive to light, neck is supple, trachea is midline, mucous membranes are moist   5. Respiratory : Lungs are clear to auscultation bilaterally without wheezing, rhonchi, rales, no cyanosis, no increase in work of breathing or accessory muscle use   6. Cardiovascular : Heart rate normal, rhythm is regular, no murmurs, rubs or gallops, no peripheral edema, peripheral pulses palpated   7. Gastrointestinal:  Abdomen is soft, nondistended, nontender to palpation bowel sounds active, no masses or organomegaly palpated   8. Skin:  Skin is warm, dry and intact without rashes, acute lesions, or ulcers on limited exam   9.Musculoskeletal:  No acute deformities or trauma, no asymmetry in tone, no peripheral edema, peripheral pulses palpated, no tenderness to palpation in the extremities  Data Reviewed: In the ED Temp 101-103, heart rate 84-107, respiratory 12-20, blood pressure 101/53-132/63, satting 80-99% - EKG shows a heart rate 103, sinus tach, QTc 427 - NPO 1.8 - Patient started on Rocephin and Zithromax as well as Decadron given a 500 mL bolus - Admission requested for acute hypoxic respiratory failure  Assessment and Plan: * Acute respiratory failure with hypoxia (HCC) - Secondary to COVID - Continue O2 supplementation - Wean off as tolerated - Continue treatment as per COVID - Procalcitonin was indeterminate and patient had a questionable retrocardiac  airspace disease, continue Rocephin and Zithromax - Continue to monitor  COVID-19 - 5 days of symptoms - Positive COVID - Was not vaccinated - Requiring 2 L nasal cannula - Discussed with pharmacy and the current algorithm is to start remdesivir and steroids - Steroids were started in the ED, continue steroids - Remdesivir started, but following doses not available any pending waiting for it to come from [facility] - Continue supportive care  AKI (acute kidney injury) (HCC) - Creatinine increased 1.20>> 1.70 - Likely secondary to poor p.o. intake - Small bolus given in the ED - Encourage p.o. fluid intake - Hold nephrotoxic agents when possible - Trend in the a.m.      Advance Care Planning:   Code  Status: Full Code  Consults: None at this time Family Communication: No family at bedside  Severity of Illness: The appropriate patient status for this patient is INPATIENT. Inpatient status is judged to be reasonable and necessary in order to provide the required intensity of service to ensure the patient's safety. The patient's presenting symptoms, physical exam findings, and initial radiographic and laboratory data in the context of their chronic comorbidities is felt to place them at high risk for further clinical deterioration. Furthermore, it is not anticipated that the patient will be medically stable for discharge from the hospital within 2 midnights of admission.   * I certify that at the point of admission it is my clinical judgment that the patient will require inpatient hospital care spanning beyond 2 midnights from the point of admission due to high intensity of service, high risk for further deterioration and high frequency of surveillance required.*  Author: Lilyan Gilford, DO 07/30/2023 6:03 AM  For on call review [email or web address].

## 2023-07-30 NOTE — Evaluation (Signed)
Physical Therapy Evaluation Patient Details Name: Travis Woodward. MRN: [id number] DOB: 07/18/1941 Today's Date: 07/30/2023  History of Present Illness  Makoto Sellitto. is a 82 y.o. male with medical history significant of CKD, CAD, hyperlipidemia, hypertension, presents to the ED with a chief complaint of cough and congestion.  Patient reports that 5 days ago he started having chest congestion with a cough productive of gray sputum.  He had no hemoptysis.  He felt feverish but did not have a measured temperature.  He denies any dyspnea, but reported generalized weakness.  He also admits to a decreased appetite.  He had no nausea, vomiting, diarrhea, abdominal pain.  Patient has had no sick contacts.  He lives with 2 other elderly people and they are able to help take care of each other.  Patient did not get his COVID-vaccine.  On review of systems patient does report that he has had some pleuritic chest pain.  It is throughout his chest that is worse when he takes a deep breath.  It is not worse with exertion necessarily.  Patient does not wear any oxygen at home.  He has no other complaints at this time.   Clinical Impression  Patient very unsteady on feet when taking steps without AD, required use of RW for safety and able to ambulate in room without loss of balance.  Patient limited for gait training mostly due to SOB with SpO2 dropping from 97% to 80%, once fatigue had to slow cadence requiring more assistance due to BLE weakness.  Patient tolerated sitting up in chair after therapy with family members present.  Patient will benefit from continued skilled physical therapy in hospital and recommended venue below to increase strength, balance, endurance for safe ADLs and gait.        If plan is discharge home, recommend the following: A little help with walking and/or transfers;A little help with bathing/dressing/bathroom;Help with stairs or ramp for entrance;Assistance with cooking/housework    Can travel by private vehicle        Equipment Recommendations None recommended by PT  Recommendations for Other Services       Functional Status Assessment Patient has had a recent decline in their functional status and demonstrates the ability to make significant improvements in function in a reasonable and predictable amount of time.     Precautions / Restrictions Precautions Precautions: Fall Restrictions Weight Bearing Restrictions: No      Mobility  Bed Mobility Overal bed mobility: Needs Assistance Bed Mobility: Supine to Sit     Supine to sit: Supervision, HOB elevated     General bed mobility comments: slightly labored movement    Transfers Overall transfer level: Needs assistance Equipment used: None, Rolling walker (2 wheels) Transfers: Sit to/from Stand, Bed to chair/wheelchair/BSC Sit to Stand: Min assist   Step pivot transfers: Min assist, Min guard       General transfer comment: very unsteady on feet having to lean on armrest of chair when completing sit to stands without AD, required RW for safety    Ambulation/Gait Ambulation/Gait assistance: Supervision, Min guard Gait Distance (Feet): 40 Feet Assistive device: Rolling walker (2 wheels) Gait Pattern/deviations: Decreased step length - right, Decreased step length - left, Decreased stride length Gait velocity: decreased     General Gait Details: slightly labored cadence without loss of balance, once fatigued due to SOB had to lean on RW more for support  Stairs  Wheelchair Mobility     Tilt Bed    Modified Rankin (Stroke Patients Only)       Balance Overall balance assessment: Needs assistance Sitting-balance support: Feet supported, No upper extremity supported Sitting balance-Leahy Scale: Fair Sitting balance - Comments: fair/good seated at EOB   Standing balance support: During functional activity, No upper extremity supported Standing balance-Leahy  Scale: Poor Standing balance comment: fair/good using RW                             Pertinent Vitals/Pain Pain Assessment Pain Assessment: Faces Faces Pain Scale: Hurts a little bit Pain Location: low back Pain Descriptors / Indicators: Aching, Discomfort Pain Intervention(s): Limited activity within patient's tolerance, Monitored during session, Repositioned, Patient requesting pain meds-RN notified    Home Living Family/patient expects to be discharged to:: Private residence Living Arrangements: Spouse/significant other Available Help at Discharge: Family;Available 24 hours/day Type of Home: House Home Access: Stairs to enter Entrance Stairs-Rails: Doctor, general practice of Steps: 2 + landing   Home Layout: One level Home Equipment: Agricultural consultant (2 wheels);Shower seat;BSC/3in1      Prior Function Prior Level of Function : Needs assist       Physical Assist : Mobility (physical);ADLs (physical) Mobility (physical): Bed mobility;Transfers;Gait;Stairs   Mobility Comments: household ambulator without AD, uses walking stick for outside ADLs Comments: Assisted by family     Hand Dominance        Extremity/Trunk Assessment   Upper Extremity Assessment Upper Extremity Assessment: Overall WFL for tasks assessed    Lower Extremity Assessment Lower Extremity Assessment: Generalized weakness    Cervical / Trunk Assessment Cervical / Trunk Assessment: Normal  Communication   Communication: No difficulties  Cognition Arousal/Alertness: Awake/alert Behavior During Therapy: WFL for tasks assessed/performed Overall Cognitive Status: Within Functional Limits for tasks assessed                                          General Comments      Exercises     Assessment/Plan    PT Assessment Patient needs continued PT services  PT Problem List Decreased strength;Decreased activity tolerance;Decreased balance;Decreased  mobility       PT Treatment Interventions DME instruction;Gait training;Stair training;Functional mobility training;Therapeutic activities;Therapeutic exercise;Patient/family education;Balance training    PT Goals (Current goals can be found in the Care Plan section)  Acute Rehab PT Goals Patient Stated Goal: return home with family to assist PT Goal Formulation: With patient/family Time For Goal Achievement: 08/06/23 Potential to Achieve Goals: Good    Frequency Min 3X/week     Co-evaluation               AM-PAC PT "6 Clicks" Mobility  Outcome Measure Help needed turning from your back to your side while in a flat bed without using bedrails?: None Help needed moving from lying on your back to sitting on the side of a flat bed without using bedrails?: None Help needed moving to and from a bed to a chair (including a wheelchair)?: A Little Help needed standing up from a chair using your arms (e.g., wheelchair or bedside chair)?: A Little Help needed to walk in hospital room?: A Little Help needed climbing 3-5 steps with a railing? : A Lot 6 Click Score: 19    End of Session Equipment Utilized During Treatment: Oxygen Activity  Tolerance: Patient tolerated treatment well;Patient limited by fatigue Patient left: in chair;with call bell/phone within reach;with family/visitor present Nurse Communication: Mobility status PT Visit Diagnosis: Unsteadiness on feet (R26.81);Other abnormalities of gait and mobility (R26.89);Muscle weakness (generalized) (M62.81)    Time: 4098-1191 PT Time Calculation (min) (ACUTE ONLY): 27 min   Charges:   PT Evaluation $PT Eval Moderate Complexity: 1 Mod PT Treatments $Therapeutic Activity: 23-37 mins PT General Charges $$ ACUTE PT VISIT: 1 Visit         3:25 PM, 07/30/23 Ocie Bob, MPT Physical Therapist with [facility] [phone] office [phone] mobile phone

## 2023-07-30 NOTE — Hospital Course (Signed)
82 y.o. male with medical history significant of CKD, CAD, hyperlipidemia, hypertension, presents to the ED with a chief complaint of cough and congestion.  Patient reports that 5 days ago he started having chest congestion with a cough productive of gray sputum.  He had no hemoptysis.  He felt feverish but did not have a measured temperature.  He denies any dyspnea, but reported generalized weakness.  He also admits to a decreased appetite.  He had no nausea, vomiting, diarrhea, abdominal pain.  Patient has had no sick contacts.  He lives with 2 other elderly people and they are able to help take care of each other.  Patient did not get his COVID-vaccine.  On review of systems patient does report that he has had some pleuritic chest pain.  It is throughout his chest that is worse when he takes a deep breath.  It is not worse with exertion necessarily.  Patient does not wear any oxygen at home.  He has no other complaints at this time.

## 2023-07-30 NOTE — Plan of Care (Signed)
  Problem: Acute Rehab PT Goals(only PT should resolve) Goal: Pt Will Go Supine/Side To Sit Outcome: Progressing Flowsheets (Taken 07/30/2023 1526) Pt will go Supine/Side to Sit:  Independently  with modified independence Goal: Patient Will Transfer Sit To/From Stand Outcome: Progressing Flowsheets (Taken 07/30/2023 1526) Patient will transfer sit to/from stand:  with modified independence  with supervision Goal: Pt Will Transfer Bed To Chair/Chair To Bed Outcome: Progressing Flowsheets (Taken 07/30/2023 1526) Pt will Transfer Bed to Chair/Chair to Bed:  with modified independence  with supervision Goal: Pt Will Ambulate Outcome: Progressing Flowsheets (Taken 07/30/2023 1526) Pt will Ambulate:  75 feet  with supervision  with modified independence  with rolling walker   3:27 PM, 07/30/23 Ocie Bob, MPT Physical Therapist with [facility] [phone] office [phone] mobile phone

## 2023-07-30 NOTE — Progress Notes (Signed)
PROGRESS NOTE   Travis Woodward.  NWG:[id number] DOB: 06-30-1941 DOA: 07/29/2023 PCP: Elfredia Nevins, MD   Chief Complaint  Patient presents with   Weakness   Level of care: Med-Surg  Brief Admission History:  82 y.o. male with medical history significant of CKD, CAD, hyperlipidemia, hypertension, presents to the ED with a chief complaint of cough and congestion.  Patient reports that 5 days ago he started having chest congestion with a cough productive of gray sputum.  He had no hemoptysis.  He felt feverish but did not have a measured temperature.  He denies any dyspnea, but reported generalized weakness.  He also admits to a decreased appetite.  He had no nausea, vomiting, diarrhea, abdominal pain.  Patient has had no sick contacts.  He lives with 2 other elderly people and they are able to help take care of each other.  Patient did not get his COVID-vaccine.  On review of systems patient does report that he has had some pleuritic chest pain.  It is throughout his chest that is worse when he takes a deep breath.  It is not worse with exertion necessarily.  Patient does not wear any oxygen at home.  He has no other complaints at this time.    Assessment and Plan:  Acute respiratory failure with hypoxia - Secondary to COVID - Continue O2 supplementation - Wean to room air oxygen   Sepsis - Likely sepsis pathology secondary to COVID - Febrile, tachycardic, tachypneic, with respiratory failure and hypoxia - sepsis physiology resolved   COVID-19 - 7 days of symptoms per patient  - Positive COVID test - Unvaccinated for covid  - initially requiring 2 L nasal cannula - continue steroids and supportive care, not candidate for paxlovid due to late presentation  AKI (acute kidney injury) - improving with IV fluids   DVT prophylaxis: [place] heparin Code Status: Full  Family Communication:  Disposition: possible DC home tomorrow if improved    Consultants:   Procedures:    Antimicrobials:    Subjective: Pt reports he is unvaccinated for Covid.  He has been having symptoms for 1 week.   Objective: Vitals:   07/29/23 2126 07/30/23 0159 07/30/23 0554 07/30/23 1019  BP: 132/62 106/67 123/63 122/60  Pulse: 80 (!) 59 66 74  Resp: 18 16 18 20   Temp: 98.1 F (36.7 C) 98.5 F (36.9 C) 98.9 F (37.2 C) (!) 97.4 F (36.3 C)  TempSrc: Oral   Oral  SpO2: 96% 96% 92% 90%  Weight:      Height:        Intake/Output Summary (Last 24 hours) at 07/30/2023 1431 Last data filed at 07/30/2023 0949 Gross per 24 hour  Intake 360 ml  Output 1600 ml  Net -1240 ml   Filed Weights   07/29/23 1814  Weight: 76.2 kg   Examination:  General exam: Appears calm and comfortable  Respiratory system: no increased work of breathing. Cardiovascular system: normal S1 & S2 heard. No JVD, murmurs, rubs, gallops or clicks. No pedal edema. Gastrointestinal system: Abdomen is nondistended, soft and nontender. No organomegaly or masses felt. Normal bowel sounds heard. Central nervous system: Alert and oriented. No focal neurological deficits. Extremities: Symmetric 5 x 5 power. Skin: No rashes, lesions or ulcers. Psychiatry: Judgement and insight appear poor. Mood & affect appropriate.   Data Reviewed: I have personally reviewed following labs and imaging studies  CBC: Recent Labs  Lab 07/29/23 1835 07/30/23 0430  WBC 9.9 6.3  NEUTROABS  8.6* 5.6  HGB 10.7* 10.5*  HCT 32.0* 32.1*  MCV 91.2 92.5  PLT 135* 117*    Basic Metabolic Panel: Recent Labs  Lab 07/29/23 1835 07/30/23 0430  NA 128* 133*  K 4.2 4.2  CL 93* 99  CO2 26 26  GLUCOSE 123* 147*  BUN 24* 23  CREATININE 1.70* 1.53*  CALCIUM 8.6* 8.7*  MG  --  2.7*    CBG: No results for input(s): "GLUCAP" in the last 168 hours.  Recent Results (from the past 240 hour(s))  Resp panel by RT-PCR (RSV, Flu A&B, Covid) Anterior Nasal Swab     Status: Abnormal   Collection Time: 07/29/23  6:16 PM   Specimen:  Anterior Nasal Swab  Result Value Ref Range Status   SARS Coronavirus 2 by RT PCR POSITIVE (A) NEGATIVE Final    Comment: (NOTE) SARS-CoV-2 target nucleic acids are DETECTED.  The SARS-CoV-2 RNA is generally detectable in upper respiratory specimens during the acute phase of infection. Positive results are indicative of the presence of the identified virus, but do not rule out bacterial infection or co-infection with other pathogens not detected by the test. Clinical correlation with patient history and other diagnostic information is necessary to determine patient infection status. The expected result is Negative.  Fact Sheet for Patients: BloggerCourse.com  Fact Sheet for Healthcare Providers: SeriousBroker.it  This test is not yet approved or cleared by the [place] FDA and  has been authorized for detection and/or diagnosis of SARS-CoV-2 by FDA under an Emergency Use Authorization (EUA).  This EUA will remain in effect (meaning this test can be used) for the duration of  the COVID-19 declaration under Section 564(b)(1) of the A ct, 21 U.S.C. section 360bbb-3(b)(1), unless the authorization is terminated or revoked sooner.     Influenza A by PCR NEGATIVE NEGATIVE Final   Influenza B by PCR NEGATIVE NEGATIVE Final    Comment: (NOTE) The Xpert Xpress SARS-CoV-2/FLU/RSV plus assay is intended as an aid in the diagnosis of influenza from Nasopharyngeal swab specimens and should not be used as a sole basis for treatment. Nasal washings and aspirates are unacceptable for Xpert Xpress SARS-CoV-2/FLU/RSV testing.  Fact Sheet for Patients: BloggerCourse.com  Fact Sheet for Healthcare Providers: SeriousBroker.it  This test is not yet approved or cleared by the [place] FDA and has been authorized for detection and/or diagnosis of SARS-CoV-2 by FDA under an Emergency Use  Authorization (EUA). This EUA will remain in effect (meaning this test can be used) for the duration of the COVID-19 declaration under Section 564(b)(1) of the Act, 21 U.S.C. section 360bbb-3(b)(1), unless the authorization is terminated or revoked.     Resp Syncytial Virus by PCR NEGATIVE NEGATIVE Final    Comment: (NOTE) Fact Sheet for Patients: BloggerCourse.com  Fact Sheet for Healthcare Providers: SeriousBroker.it  This test is not yet approved or cleared by the [place] FDA and has been authorized for detection and/or diagnosis of SARS-CoV-2 by FDA under an Emergency Use Authorization (EUA). This EUA will remain in effect (meaning this test can be used) for the duration of the COVID-19 declaration under Section 564(b)(1) of the Act, 21 U.S.C. section 360bbb-3(b)(1), unless the authorization is terminated or revoked.  Performed at [facility], [address], [place], [place] [address]   Blood Culture (routine x 2)     Status: None (Preliminary result)   Collection Time: 07/29/23  6:25 PM   Specimen: Left Antecubital; Blood  Result Value Ref Range Status  Specimen Description LEFT ANTECUBITAL  Final   Special Requests   Final    BOTTLES DRAWN AEROBIC ONLY Blood Culture results may not be optimal due to an inadequate volume of blood received in culture bottles   Culture   Final    NO GROWTH < 24 HOURS Performed at [facility], [address], [place], [place] [address]    Report Status PENDING  Incomplete  Blood Culture (routine x 2)     Status: None (Preliminary result)   Collection Time: 07/29/23  6:32 PM   Specimen: BLOOD LEFT FOREARM  Result Value Ref Range Status   Specimen Description BLOOD LEFT FOREARM  Final   Special Requests   Final    BOTTLES DRAWN AEROBIC AND ANAEROBIC Blood Culture adequate volume   Culture   Final    NO GROWTH < 24 HOURS Performed at [facility], [address] [place], [place]  [address]    Report Status PENDING  Incomplete     Radiology Studies: DG Chest Port 1 View  Result Date: 07/29/2023 CLINICAL DATA:  [id number] Fever [id number] EXAM: PORTABLE CHEST 1 VIEW COMPARISON:  CT chest 09/07/2019, chest x-ray 06/28/2019 FINDINGS: The heart and mediastinal contours are unchanged. Aortic calcification. Retrocardiac airspace opacity. Chronic coarsened interstitial markings with no overt pulmonary edema. No pleural effusion. No pneumothorax. No acute osseous abnormality. IMPRESSION: 1. Retrocardiac airspace opacity that may represent a combination of atelectasis and infection/inflammation. Followup PA and lateral chest X-ray is recommended in 3-4 weeks following therapy to ensure resolution. 2.  Aortic Atherosclerosis (ICD10-I70.0). Electronically Signed   By: Tish Frederickson M.D.   On: 07/29/2023 19:02    Scheduled Meds:  aspirin EC  81 mg Oral Daily   clopidogrel  75 mg Oral Daily   [START ON 07/31/2023] doxycycline  100 mg Oral Q12H   heparin  5,000 Units Subcutaneous Q8H   methylPREDNISolone (SOLU-MEDROL) injection  1 mg/kg Intravenous Q24H   Followed by   Melene Muller ON 08/02/2023] predniSONE  50 mg Oral Daily   Continuous Infusions:  lactated ringers 50 mL/hr at 07/30/23 0850     LOS: 1 day   Time spent: 30 mins   Laural Benes, MD How to contact the [facility] Attending or Consulting provider 7A - 7P or covering provider during after hours 7P -7A, for this patient?  Check the care team in [facility] and look for a) attending/consulting [facility] provider listed and b) the [facility] team listed Log into [email or web address] and use [place]'s universal password to access. If you do not have the password, please contact the hospital operator. Locate the [facility] provider you are looking for under [facility] and page to a number that you can be directly reached. If you still have difficulty reaching the provider, please page the [facility] (Director on Call) for the Hospitalists listed on amion for  assistance.  07/30/2023, 2:31 PM

## 2023-07-30 NOTE — Assessment & Plan Note (Signed)
-   Creatinine increased 1.20>> 1.70 - Likely secondary to poor p.o. intake - Small bolus given in the ED - Encourage p.o. fluid intake - Hold nephrotoxic agents when possible - Trend in the a.m.

## 2023-07-30 NOTE — Assessment & Plan Note (Signed)
-   Secondary to COVID - Continue O2 supplementation - Wean off as tolerated - Continue treatment as per COVID - Procalcitonin was indeterminate and patient had a questionable retrocardiac airspace disease, continue Rocephin and Zithromax - Continue to monitor

## 2023-07-30 NOTE — Assessment & Plan Note (Signed)
-   5 days of symptoms - Positive COVID - Was not vaccinated - Requiring 2 L nasal cannula - Discussed with pharmacy and the current algorithm is to start remdesivir and steroids - Steroids were started in the ED, continue steroids - Remdesivir started, but following doses not available any pending waiting for it to come from [facility] - Continue supportive care

## 2023-07-31 ENCOUNTER — Inpatient Hospital Stay (HOSPITAL_COMMUNITY): Payer: Medicare HMO

## 2023-07-31 DIAGNOSIS — U071 COVID-19: Secondary | ICD-10-CM | POA: Diagnosis not present

## 2023-07-31 DIAGNOSIS — N179 Acute kidney failure, unspecified: Secondary | ICD-10-CM | POA: Diagnosis not present

## 2023-07-31 DIAGNOSIS — J9601 Acute respiratory failure with hypoxia: Secondary | ICD-10-CM | POA: Diagnosis not present

## 2023-07-31 MED ORDER — IPRATROPIUM-ALBUTEROL 0.5-2.5 (3) MG/3ML IN SOLN
3.0000 mL | Freq: Four times a day (QID) | RESPIRATORY_TRACT | Status: DC | PRN
  Administered 2023-07-31 – 2023-08-01 (×3): 3 mL via RESPIRATORY_TRACT
  Filled 2023-07-31 (×3): qty 3

## 2023-07-31 MED ORDER — OXYCODONE HCL 5 MG PO TABS
30.0000 mg | ORAL_TABLET | Freq: Four times a day (QID) | ORAL | Status: DC | PRN
  Administered 2023-07-31 – 2023-08-01 (×4): 30 mg via ORAL
  Filled 2023-07-31 (×4): qty 6

## 2023-07-31 MED ORDER — MORPHINE SULFATE (PF) 2 MG/ML IV SOLN
2.0000 mg | Freq: Once | INTRAVENOUS | Status: AC
  Administered 2023-07-31: 2 mg via INTRAVENOUS
  Filled 2023-07-31: qty 1

## 2023-07-31 NOTE — Progress Notes (Signed)
Pt c/o SOB and a stabbing pain in chest upon inhalation. Pt's SP02 91% on 4 L, bumped up from 2 L where pt was originally saturating at 87%. HR 80. BP 131/66 (82). EKG showed NSR and is in chart. RT notified, PRN Neb Tx given. MD notified, Chest  X-ray completed. Incentive spirometer education provided as well as coughing and deep breathing w/ teach back.   Pt expressed frustration about not being able to receive 15 mg Oxycodone like he does at home. MD notified of pt's pain status, one time dose of Morphine ordered and given with relief.

## 2023-07-31 NOTE — Progress Notes (Signed)
PROGRESS NOTE   Travis Woodward.  ZOX:[id number] DOB: 01/10/1941 DOA: 07/29/2023 PCP: Elfredia Nevins, MD   Chief Complaint  Patient presents with   Weakness   Level of care: Med-Surg  Brief Admission History:  82 y.o. male with medical history significant of CKD, CAD, hyperlipidemia, hypertension, presents to the ED with a chief complaint of cough and congestion.  Patient reports that 5 days ago he started having chest congestion with a cough productive of gray sputum.  He had no hemoptysis.  He felt feverish but did not have a measured temperature.  He denies any dyspnea, but reported generalized weakness.  He also admits to a decreased appetite.  He had no nausea, vomiting, diarrhea, abdominal pain.  Patient has had no sick contacts.  He lives with 2 other elderly people and they are able to help take care of each other.  Patient did not get his COVID-vaccine.  On review of systems patient does report that he has had some pleuritic chest pain.  It is throughout his chest that is worse when he takes a deep breath.  It is not worse with exertion necessarily.  Patient does not wear any oxygen at home.  He has no other complaints at this time.    Assessment and Plan:  Acute respiratory failure with hypoxia - Secondary to COVID infection  - Pt still requiring O2 supplementation - Wean to room air oxygen as able   Sepsis - Likely sepsis pathology secondary to COVID - Febrile, tachycardic, tachypneic, with respiratory failure and hypoxia - sepsis physiology resolved   COVID-19 - 7 days of symptoms per patient  - Positive COVID test - Unvaccinated for covid  - initially requiring 2 L nasal cannula but bumped up to 5L overnight - continue steroids and supportive care, not candidate for paxlovid due to late presentation - continue to work on weaning oxygen - keep in hospital due to acute bump in oxygen requirement  AKI (acute kidney injury) - improving with IV fluids   DVT prophylaxis:  [place] heparin Code Status: Full  Family Communication: call to daughter 8/3  Disposition: home when medically stable     Consultants:   Procedures:   Antimicrobials:    Subjective: Pt says pain not controlled when he got off his regular home pain routine.     Objective: Vitals:   07/31/23 0151 07/31/23 0201 07/31/23 0203 07/31/23 0542  BP:  131/66  124/60  Pulse:  80  74  Resp:  20  20  Temp:    98.1 F (36.7 C)  TempSrc:      SpO2: (!) 87% 91% 91% 95%  Weight:      Height:        Intake/Output Summary (Last 24 hours) at 07/31/2023 1448 Last data filed at 07/31/2023 0800 Gross per 24 hour  Intake 605.83 ml  Output 800 ml  Net -194.17 ml   Filed Weights   07/29/23 1814  Weight: 76.2 kg   Examination:  General exam: Appears calm and comfortable  Respiratory system: no increased work of breathing. Cardiovascular system: normal S1 & S2 heard. No JVD, murmurs, rubs, gallops or clicks. No pedal edema. Gastrointestinal system: Abdomen is nondistended, soft and nontender. No organomegaly or masses felt. Normal bowel sounds heard. Central nervous system: Alert and oriented. No focal neurological deficits. Extremities: Symmetric 5 x 5 power. Skin: No rashes, lesions or ulcers. Psychiatry: Judgement and insight appear poor. Mood & affect appropriate.   Data Reviewed: I have  personally reviewed following labs and imaging studies  CBC: Recent Labs  Lab 07/29/23 1835 07/30/23 0430  WBC 9.9 6.3  NEUTROABS 8.6* 5.6  HGB 10.7* 10.5*  HCT 32.0* 32.1*  MCV 91.2 92.5  PLT 135* 117*    Basic Metabolic Panel: Recent Labs  Lab 07/29/23 1835 07/30/23 0430  NA 128* 133*  K 4.2 4.2  CL 93* 99  CO2 26 26  GLUCOSE 123* 147*  BUN 24* 23  CREATININE 1.70* 1.53*  CALCIUM 8.6* 8.7*  MG  --  2.7*    CBG: No results for input(s): "GLUCAP" in the last 168 hours.  Recent Results (from the past 240 hour(s))  Resp panel by RT-PCR (RSV, Flu A&B, Covid) Anterior Nasal Swab      Status: Abnormal   Collection Time: 07/29/23  6:16 PM   Specimen: Anterior Nasal Swab  Result Value Ref Range Status   SARS Coronavirus 2 by RT PCR POSITIVE (A) NEGATIVE Final    Comment: (NOTE) SARS-CoV-2 target nucleic acids are DETECTED.  The SARS-CoV-2 RNA is generally detectable in upper respiratory specimens during the acute phase of infection. Positive results are indicative of the presence of the identified virus, but do not rule out bacterial infection or co-infection with other pathogens not detected by the test. Clinical correlation with patient history and other diagnostic information is necessary to determine patient infection status. The expected result is Negative.  Fact Sheet for Patients: BloggerCourse.com  Fact Sheet for Healthcare Providers: SeriousBroker.it  This test is not yet approved or cleared by the [place] FDA and  has been authorized for detection and/or diagnosis of SARS-CoV-2 by FDA under an Emergency Use Authorization (EUA).  This EUA will remain in effect (meaning this test can be used) for the duration of  the COVID-19 declaration under Section 564(b)(1) of the A ct, 21 U.S.C. section 360bbb-3(b)(1), unless the authorization is terminated or revoked sooner.     Influenza A by PCR NEGATIVE NEGATIVE Final   Influenza B by PCR NEGATIVE NEGATIVE Final    Comment: (NOTE) The Xpert Xpress SARS-CoV-2/FLU/RSV plus assay is intended as an aid in the diagnosis of influenza from Nasopharyngeal swab specimens and should not be used as a sole basis for treatment. Nasal washings and aspirates are unacceptable for Xpert Xpress SARS-CoV-2/FLU/RSV testing.  Fact Sheet for Patients: BloggerCourse.com  Fact Sheet for Healthcare Providers: SeriousBroker.it  This test is not yet approved or cleared by the [place] FDA and has been authorized for  detection and/or diagnosis of SARS-CoV-2 by FDA under an Emergency Use Authorization (EUA). This EUA will remain in effect (meaning this test can be used) for the duration of the COVID-19 declaration under Section 564(b)(1) of the Act, 21 U.S.C. section 360bbb-3(b)(1), unless the authorization is terminated or revoked.     Resp Syncytial Virus by PCR NEGATIVE NEGATIVE Final    Comment: (NOTE) Fact Sheet for Patients: BloggerCourse.com  Fact Sheet for Healthcare Providers: SeriousBroker.it  This test is not yet approved or cleared by the [place] FDA and has been authorized for detection and/or diagnosis of SARS-CoV-2 by FDA under an Emergency Use Authorization (EUA). This EUA will remain in effect (meaning this test can be used) for the duration of the COVID-19 declaration under Section 564(b)(1) of the Act, 21 U.S.C. section 360bbb-3(b)(1), unless the authorization is terminated or revoked.  Performed at [facility], [address] [place], [place] [address]   Blood Culture (routine x 2)     Status: None (  Preliminary result)   Collection Time: 07/29/23  6:25 PM   Specimen: Left Antecubital; Blood  Result Value Ref Range Status   Specimen Description LEFT ANTECUBITAL  Final   Special Requests   Final    BOTTLES DRAWN AEROBIC ONLY Blood Culture results may not be optimal due to an inadequate volume of blood received in culture bottles   Culture   Final    NO GROWTH 2 DAYS Performed at [facility], [address] [place], [place] [address]    Report Status PENDING  Incomplete  Blood Culture (routine x 2)     Status: None (Preliminary result)   Collection Time: 07/29/23  6:32 PM   Specimen: BLOOD LEFT FOREARM  Result Value Ref Range Status   Specimen Description BLOOD LEFT FOREARM  Final   Special Requests   Final    BOTTLES DRAWN AEROBIC AND ANAEROBIC Blood Culture adequate volume   Culture   Final    NO GROWTH 2  DAYS Performed at [facility], [address], [place], [place] [address]    Report Status PENDING  Incomplete     Radiology Studies: DG CHEST PORT 1 VIEW  Result Date: 07/31/2023 CLINICAL DATA:  Acute respiratory failure with hypoxia. EXAM: PORTABLE CHEST 1 VIEW COMPARISON:  07/29/2023. FINDINGS: The heart size and mediastinal contours are within normal limits. There is atherosclerotic calcification of the aorta. Mild airspace disease is present at the lung bases bilaterally. No effusion or pneumothorax. Cervical spinal fusion hardware is noted. No acute osseous abnormality. IMPRESSION: Increased airspace disease at the lung bases bilaterally, possible atelectasis or infiltrate. Electronically Signed   By: Thornell Sartorius M.D.   On: 07/31/2023 02:40   DG Chest Port 1 View  Result Date: 07/29/2023 CLINICAL DATA:  [id number] Fever [id number] EXAM: PORTABLE CHEST 1 VIEW COMPARISON:  CT chest 09/07/2019, chest x-ray 06/28/2019 FINDINGS: The heart and mediastinal contours are unchanged. Aortic calcification. Retrocardiac airspace opacity. Chronic coarsened interstitial markings with no overt pulmonary edema. No pleural effusion. No pneumothorax. No acute osseous abnormality. IMPRESSION: 1. Retrocardiac airspace opacity that may represent a combination of atelectasis and infection/inflammation. Followup PA and lateral chest X-ray is recommended in 3-4 weeks following therapy to ensure resolution. 2.  Aortic Atherosclerosis (ICD10-I70.0). Electronically Signed   By: Tish Frederickson M.D.   On: 07/29/2023 19:02    Scheduled Meds:  aspirin EC  81 mg Oral Daily   clopidogrel  75 mg Oral Daily   doxycycline  100 mg Oral Q12H   heparin  5,000 Units Subcutaneous Q8H   methylPREDNISolone (SOLU-MEDROL) injection  1 mg/kg Intravenous Q24H   Followed by   Melene Muller ON 08/02/2023] predniSONE  50 mg Oral Daily   Continuous Infusions:    LOS: 2 days   Time spent: 30 mins   Laural Benes, MD How to contact the [facility]  Attending or Consulting provider 7A - 7P or covering provider during after hours 7P -7A, for this patient?  Check the care team in [facility] and look for a) attending/consulting [facility] provider listed and b) the [facility] team listed Log into [email or web address] and use [place]'s universal password to access. If you do not have the password, please contact the hospital operator. Locate the [facility] provider you are looking for under [facility] and page to a number that you can be directly reached. If you still have difficulty reaching the provider, please page the [facility] (Director on Call) for the Hospitalists listed on amion for assistance.  07/31/2023, 2:48 PM

## 2023-08-01 ENCOUNTER — Encounter (HOSPITAL_COMMUNITY): Payer: Self-pay | Admitting: Family Medicine

## 2023-08-01 DIAGNOSIS — N179 Acute kidney failure, unspecified: Secondary | ICD-10-CM | POA: Diagnosis not present

## 2023-08-01 DIAGNOSIS — J9601 Acute respiratory failure with hypoxia: Secondary | ICD-10-CM | POA: Diagnosis not present

## 2023-08-01 DIAGNOSIS — U071 COVID-19: Secondary | ICD-10-CM | POA: Diagnosis not present

## 2023-08-01 LAB — BASIC METABOLIC PANEL WITH GFR
Anion gap: 9 (ref 5–15)
BUN: 28 mg/dL — ABNORMAL HIGH (ref 8–23)
CO2: 25 mmol/L (ref 22–32)
Calcium: 9.1 mg/dL (ref 8.9–10.3)
Chloride: 104 mmol/L (ref 98–111)
Creatinine, Ser: 1.28 mg/dL — ABNORMAL HIGH (ref 0.61–1.24)
GFR, Estimated: 56 mL/min — ABNORMAL LOW (ref >60)
Glucose, Bld: 113 mg/dL — ABNORMAL HIGH (ref 70–99)
Potassium: 4.5 mmol/L (ref 3.5–5.1)
Sodium: 138 mmol/L (ref 135–145)

## 2023-08-01 MED ORDER — PREDNISONE 50 MG PO TABS: 50.0000 mg | ORAL_TABLET | Freq: Every day | ORAL | 0 refills | Status: DC

## 2023-08-01 MED ORDER — OXYCODONE HCL 15 MG PO TABS: 30.0000 mg | ORAL_TABLET | Freq: Four times a day (QID) | ORAL | Status: DC | PRN

## 2023-08-01 MED ORDER — IPRATROPIUM-ALBUTEROL 0.5-2.5 (3) MG/3ML IN SOLN: 3.0000 mL | Freq: Three times a day (TID) | RESPIRATORY_TRACT | 2 refills | Status: DC

## 2023-08-01 MED ORDER — DOXYCYCLINE HYCLATE 100 MG PO TABS: 100.0000 mg | ORAL_TABLET | Freq: Two times a day (BID) | ORAL | 0 refills | Status: AC

## 2023-08-01 MED ORDER — PREDNISONE 50 MG PO TABS: 50.0000 mg | ORAL_TABLET | Freq: Every day | ORAL | 0 refills | Status: AC

## 2023-08-01 MED ORDER — DOXYCYCLINE HYCLATE 100 MG PO TABS: 100.0000 mg | ORAL_TABLET | Freq: Two times a day (BID) | ORAL | 0 refills | Status: DC

## 2023-08-01 NOTE — Progress Notes (Signed)
Called by RN to assess patient for SPO2 decline and possible PRN nebulizer. Upon arrival patient was resting in the bed with O2 in nares at 6lpm. SPO2 reading with pulse oximeter on finger was 84%. I placed a probe on his ear lobe to see if the reading was correlating. It was. Patient is in no noted distress and talking. He stated he gives out very quickly if trying to move but has plenty of help at home if needed. While getting his nebulizer treatment his SPO2 on the finger monitor reading was from 84%-100%. This was using the dynamap machine. His ear lobe probe is on a portable pulse ox machine. It is still reading only 90%. He stated that the nebulizer helps him breathe easier but when its done, the sensation goes away. After the nebulizer and placing patient back on 4lpm [address], his SPO2 is still ranging from 87%-91.

## 2023-08-01 NOTE — Progress Notes (Signed)
Oxygen delivered by adapt; instructions reviewed with patient and family. Manya Silvas, RN

## 2023-08-01 NOTE — Plan of Care (Signed)
  Problem: Education: Goal: Knowledge of General Education information will improve Description: Including pain rating scale, medication(s)/side effects and non-pharmacologic comfort measures Outcome: Adequate for Discharge   Problem: Health Behavior/Discharge Planning: Goal: Ability to manage health-related needs will improve Outcome: Adequate for Discharge   Problem: Clinical Measurements: Goal: Ability to maintain clinical measurements within normal limits will improve Outcome: Adequate for Discharge Goal: Will remain free from infection Outcome: Adequate for Discharge Goal: Diagnostic test results will improve Outcome: Adequate for Discharge Goal: Respiratory complications will improve Outcome: Adequate for Discharge Goal: Cardiovascular complication will be avoided Outcome: Adequate for Discharge   Problem: Activity: Goal: Risk for activity intolerance will decrease Outcome: Adequate for Discharge   Problem: Nutrition: Goal: Adequate nutrition will be maintained Outcome: Adequate for Discharge   Problem: Coping: Goal: Level of anxiety will decrease Outcome: Adequate for Discharge   Problem: Elimination: Goal: Will not experience complications related to bowel motility Outcome: Adequate for Discharge Goal: Will not experience complications related to urinary retention Outcome: Adequate for Discharge   Problem: Pain Managment: Goal: General experience of comfort will improve Outcome: Adequate for Discharge   Problem: Safety: Goal: Ability to remain free from injury will improve Outcome: Adequate for Discharge   Problem: Skin Integrity: Goal: Risk for impaired skin integrity will decrease Outcome: Adequate for Discharge   Problem: Education: Goal: Knowledge of risk factors and measures for prevention of condition will improve Outcome: Adequate for Discharge   Problem: Coping: Goal: Psychosocial and spiritual needs will be supported Outcome: Adequate for  Discharge   Problem: Respiratory: Goal: Will maintain a patent airway Outcome: Adequate for Discharge Goal: Complications related to the disease process, condition or treatment will be avoided or minimized Outcome: Adequate for Discharge   Problem: Activity: Goal: Ability to tolerate increased activity will improve Outcome: Adequate for Discharge   Problem: Clinical Measurements: Goal: Ability to maintain a body temperature in the normal range will improve Outcome: Adequate for Discharge   Problem: Respiratory: Goal: Ability to maintain adequate ventilation will improve Outcome: Adequate for Discharge Goal: Ability to maintain a clear airway will improve Outcome: Adequate for Discharge

## 2023-08-01 NOTE — Discharge Summary (Signed)
Physician Discharge Summary  [name]. ZOX:[id number] DOB: June 10, 1941 DOA: 07/29/2023  PCP: Elfredia Nevins, MD  Admit date: 07/29/2023 Discharge date: 08/01/2023  Admitted From:  Home  Disposition: Home with HH   Recommendations for Outpatient Follow-up:  Follow up with PCP in 1 weeks Follow up with [facility] 2 weeks Please wear home oxygen 4L at rest, 6L when ambulating Please take nebulizer treatments 3 times daily   Home Health:  PT, RN  Discharge Condition: STABLE   CODE STATUS: FULL  DIET: resume previous home diet    Brief Hospitalization Summary: Please see all hospital notes, images, labs for full details of the hospitalization. ADMISSION PROVIDER HPI:  82 y.o. male with medical history significant of CKD, CAD, hyperlipidemia, hypertension, presents to the ED with a chief complaint of cough and congestion.  Patient reports that 5 days ago he started having chest congestion with a cough productive of gray sputum.  He had no hemoptysis.  He felt feverish but did not have a measured temperature.  He denies any dyspnea, but reported generalized weakness.  He also admits to a decreased appetite.  He had no nausea, vomiting, diarrhea, abdominal pain.  Patient has had no sick contacts.  He lives with 2 other elderly people and they are able to help take care of each other.  Patient did not get his COVID-vaccine.  On review of systems patient does report that he has had some pleuritic chest pain.  It is throughout his chest that is worse when he takes a deep breath.  It is not worse with exertion necessarily.  Patient does not wear any oxygen at home.  He has no other complaints at this time.   HOSPITAL COURSE BY PROBLEM LIST  Acute respiratory failure with hypoxia - Secondary to COVID infection  - Pt still requiring O2 supplementation - Pt will need to DC home with oxygen, 4L/min with rest and 6L/min with ambulation - follow up with PCP and pulmonology outpatient to  wean oxygen over time as lungs recover from Covid damage   Sepsis - sepsis pathology secondary to COVID infection - Febrile, tachycardic, tachypneic, with respiratory failure and hypoxia - sepsis physiology resolved    COVID-19 - presented with 7 days of symptoms prior to arrival   - Positive COVID test - Unvaccinated for covid  - he will have to discharge on home oxygen due to underlying lung parenchymal damage from covid infection - continue steroids and supportive care, not candidate for paxlovid due to late presentation - continue to work on weaning oxygen on outpatient basis with PCP and pulmonology   AKI (acute kidney injury) - improved with IV fluids  Chronic Pain/Opioid Dependence - resume home medications    Discharge Diagnoses:  Principal Problem:   Acute respiratory failure with hypoxia (HCC) Active Problems:   AKI (acute kidney injury) (HCC)   COVID-19   Sepsis [facility])  Discharge Instructions: Discharge Instructions     Ambulatory referral to Pulmonology   Complete by: As directed    Reason for referral: Asthma/COPD      Allergies as of 08/01/2023       Reactions   Crestor [rosuvastatin]    Paroxetine Rash        Medication List     TAKE these medications    aspirin EC 81 MG tablet Take by mouth.   cholecalciferol 1000 units tablet Commonly known as: VITAMIN D Take 5,000 Units by mouth daily.   clopidogrel 75 MG  tablet Commonly known as: PLAVIX Take 75 mg by mouth daily. 1 Tablet Daily   doxycycline 100 MG tablet Commonly known as: VIBRA-TABS Take 1 tablet (100 mg total) by mouth every 12 (twelve) hours for 2 days.   ipratropium-albuterol 0.5-2.5 (3) MG/3ML Soln Commonly known as: DUONEB Take 3 mLs by nebulization 3 (three) times daily.   nitroGLYCERIN 0.4 MG SL tablet Commonly known as: NITROSTAT Place 0.4 mg under the tongue every 5 (five) minutes as needed.   oxyCODONE 15 MG immediate release tablet Commonly known as:  ROXICODONE Take 2 tablets (30 mg total) by mouth every 6 (six) hours as needed for pain. He takes 3-4 times a day as needed   predniSONE 50 MG tablet Commonly known as: DELTASONE Take 1 tablet (50 mg total) by mouth daily with breakfast for 7 days.   VITAMIN B-12 IJ Inject 1 mL as directed every 30 (thirty) days.               Durable Medical Equipment  (From admission, onward)           Start     Ordered   08/01/23 1112  For home use only DME Nebulizer machine  Once       Question Answer Comment  Patient needs a nebulizer to treat with the following condition COPD (chronic obstructive pulmonary disease) (HCC)   Length of Need Lifetime      08/01/23 1111   08/01/23 1110  For home use only DME oxygen  Once       Comments: Pt should wear 4 Liter when resting and increase to 6L when ambulating  Question Answer Comment  Length of Need 12 Months   Mode or (Route) Nasal cannula   Liters per Minute 4   Frequency Continuous (stationary and portable oxygen unit needed)   Oxygen conserving device Yes   Oxygen delivery system Gas      08/01/23 1110            Follow-up Information     Elfredia Nevins, MD. Schedule an appointment as soon as possible for a visit in 1 week(s).   Specialty: Internal Medicine Why: Hospital Follow Up Contact information: [address] [place] [address] [phone]         [facility]. Schedule an appointment as soon as possible for a visit in 2 week(s).   Specialty: Pulmonology Why: Hospital Follow Up Contact information: [address] [place] [address] [phone]               Allergies  Allergen Reactions   Crestor [Rosuvastatin]    Paroxetine Rash   Allergies as of 08/01/2023       Reactions   Crestor [rosuvastatin]    Paroxetine Rash        Medication List     TAKE these medications    aspirin EC 81 MG tablet Take by mouth.    cholecalciferol 1000 units tablet Commonly known as: VITAMIN D Take 5,000 Units by mouth daily.   clopidogrel 75 MG tablet Commonly known as: PLAVIX Take 75 mg by mouth daily. 1 Tablet Daily   doxycycline 100 MG tablet Commonly known as: VIBRA-TABS Take 1 tablet (100 mg total) by mouth every 12 (twelve) hours for 2 days.   ipratropium-albuterol 0.5-2.5 (3) MG/3ML Soln Commonly known as: DUONEB Take 3 mLs by nebulization 3 (three) times daily.   nitroGLYCERIN 0.4 MG SL tablet Commonly known as: NITROSTAT Place 0.4 mg  under the tongue every 5 (five) minutes as needed.   oxyCODONE 15 MG immediate release tablet Commonly known as: ROXICODONE Take 2 tablets (30 mg total) by mouth every 6 (six) hours as needed for pain. He takes 3-4 times a day as needed   predniSONE 50 MG tablet Commonly known as: DELTASONE Take 1 tablet (50 mg total) by mouth daily with breakfast for 7 days.   VITAMIN B-12 IJ Inject 1 mL as directed every 30 (thirty) days.               Durable Medical Equipment  (From admission, onward)           Start     Ordered   08/01/23 1112  For home use only DME Nebulizer machine  Once       Question Answer Comment  Patient needs a nebulizer to treat with the following condition COPD (chronic obstructive pulmonary disease) (HCC)   Length of Need Lifetime      08/01/23 1111   08/01/23 1110  For home use only DME oxygen  Once       Comments: Pt should wear 4 Liter when resting and increase to 6L when ambulating  Question Answer Comment  Length of Need 12 Months   Mode or (Route) Nasal cannula   Liters per Minute 4   Frequency Continuous (stationary and portable oxygen unit needed)   Oxygen conserving device Yes   Oxygen delivery system Gas      08/01/23 1110            Procedures/Studies: DG CHEST PORT 1 VIEW  Result Date: 07/31/2023 CLINICAL DATA:  Acute respiratory failure with hypoxia. EXAM: PORTABLE CHEST 1 VIEW COMPARISON:  07/29/2023.  FINDINGS: The heart size and mediastinal contours are within normal limits. There is atherosclerotic calcification of the aorta. Mild airspace disease is present at the lung bases bilaterally. No effusion or pneumothorax. Cervical spinal fusion hardware is noted. No acute osseous abnormality. IMPRESSION: Increased airspace disease at the lung bases bilaterally, possible atelectasis or infiltrate. Electronically Signed   By: Thornell Sartorius M.D.   On: 07/31/2023 02:40   DG Chest Port 1 View  Result Date: 07/29/2023 CLINICAL DATA:  [id number] Fever [id number] EXAM: PORTABLE CHEST 1 VIEW COMPARISON:  CT chest 09/07/2019, chest x-ray 06/28/2019 FINDINGS: The heart and mediastinal contours are unchanged. Aortic calcification. Retrocardiac airspace opacity. Chronic coarsened interstitial markings with no overt pulmonary edema. No pleural effusion. No pneumothorax. No acute osseous abnormality. IMPRESSION: 1. Retrocardiac airspace opacity that may represent a combination of atelectasis and infection/inflammation. Followup PA and lateral chest X-ray is recommended in 3-4 weeks following therapy to ensure resolution. 2.  Aortic Atherosclerosis (ICD10-I70.0). Electronically Signed   By: Tish Frederickson M.D.   On: 07/29/2023 19:02     Subjective: Pt says he is getting cabin fever isolated in room, he says he wants to go home today, he says he feels stable, when at rest he is not SOB, he gets some SOB when ambulating. He says he will continue to wear his oxygen at home and will have family supervision at home.    Discharge Exam: Vitals:   08/01/23 0508 08/01/23 1025  BP: 119/65   Pulse: 64   Resp: 18   Temp: 99 F (37.2 C)   SpO2: 90% (!) 84%   Vitals:   07/31/23 1552 07/31/23 2151 08/01/23 0508 08/01/23 1025  BP:  (!) 114/57 119/65   Pulse: 69 65 64   Resp: 18 18  18   Temp:  97.8 F (36.6 C) 99 F (37.2 C)   TempSrc:   Oral   SpO2: 90% 94% 90% (!) 84%  Weight:      Height:       General: Pt is alert,  awake, not in acute distress Cardiovascular: normal S1/S2 +, no rubs, no gallops Respiratory: good air movement, no increased work of breathing Abdominal: Soft, NT, ND, bowel sounds + Extremities: no edema, no cyanosis   The results of significant diagnostics from this hospitalization (including imaging, microbiology, ancillary and laboratory) are listed below for reference.     Microbiology: Recent Results (from the past 240 hour(s))  Resp panel by RT-PCR (RSV, Flu A&B, Covid) Anterior Nasal Swab     Status: Abnormal   Collection Time: 07/29/23  6:16 PM   Specimen: Anterior Nasal Swab  Result Value Ref Range Status   SARS Coronavirus 2 by RT PCR POSITIVE (A) NEGATIVE Final    Comment: (NOTE) SARS-CoV-2 target nucleic acids are DETECTED.  The SARS-CoV-2 RNA is generally detectable in upper respiratory specimens during the acute phase of infection. Positive results are indicative of the presence of the identified virus, but do not rule out bacterial infection or co-infection with other pathogens not detected by the test. Clinical correlation with patient history and other diagnostic information is necessary to determine patient infection status. The expected result is Negative.  Fact Sheet for Patients: BloggerCourse.com  Fact Sheet for Healthcare Providers: SeriousBroker.it  This test is not yet approved or cleared by the [place] FDA and  has been authorized for detection and/or diagnosis of SARS-CoV-2 by FDA under an Emergency Use Authorization (EUA).  This EUA will remain in effect (meaning this test can be used) for the duration of  the COVID-19 declaration under Section 564(b)(1) of the A ct, 21 U.S.C. section 360bbb-3(b)(1), unless the authorization is terminated or revoked sooner.     Influenza A by PCR NEGATIVE NEGATIVE Final   Influenza B by PCR NEGATIVE NEGATIVE Final    Comment: (NOTE) The Xpert Xpress  SARS-CoV-2/FLU/RSV plus assay is intended as an aid in the diagnosis of influenza from Nasopharyngeal swab specimens and should not be used as a sole basis for treatment. Nasal washings and aspirates are unacceptable for Xpert Xpress SARS-CoV-2/FLU/RSV testing.  Fact Sheet for Patients: BloggerCourse.com  Fact Sheet for Healthcare Providers: SeriousBroker.it  This test is not yet approved or cleared by the [place] FDA and has been authorized for detection and/or diagnosis of SARS-CoV-2 by FDA under an Emergency Use Authorization (EUA). This EUA will remain in effect (meaning this test can be used) for the duration of the COVID-19 declaration under Section 564(b)(1) of the Act, 21 U.S.C. section 360bbb-3(b)(1), unless the authorization is terminated or revoked.     Resp Syncytial Virus by PCR NEGATIVE NEGATIVE Final    Comment: (NOTE) Fact Sheet for Patients: BloggerCourse.com  Fact Sheet for Healthcare Providers: SeriousBroker.it  This test is not yet approved or cleared by the [place] FDA and has been authorized for detection and/or diagnosis of SARS-CoV-2 by FDA under an Emergency Use Authorization (EUA). This EUA will remain in effect (meaning this test can be used) for the duration of the COVID-19 declaration under Section 564(b)(1) of the Act, 21 U.S.C. section 360bbb-3(b)(1), unless the authorization is terminated or revoked.  Performed at [facility], [address] [place], [place] [address]   Blood Culture (routine x 2)     Status: None (Preliminary result)  Collection Time: 07/29/23  6:25 PM   Specimen: Left Antecubital; Blood  Result Value Ref Range Status   Specimen Description LEFT ANTECUBITAL  Final   Special Requests   Final    BOTTLES DRAWN AEROBIC ONLY Blood Culture results may not be optimal due to an inadequate volume of blood received in  culture bottles   Culture   Final    NO GROWTH 3 DAYS Performed at [facility], [address] [place], [place] [address]    Report Status PENDING  Incomplete  Blood Culture (routine x 2)     Status: None (Preliminary result)   Collection Time: 07/29/23  6:32 PM   Specimen: BLOOD LEFT FOREARM  Result Value Ref Range Status   Specimen Description BLOOD LEFT FOREARM  Final   Special Requests   Final    BOTTLES DRAWN AEROBIC AND ANAEROBIC Blood Culture adequate volume   Culture   Final    NO GROWTH 3 DAYS Performed at [facility], [address] [place], [place] [address]    Report Status PENDING  Incomplete     Labs: BNP (last 3 results) Recent Labs    07/29/23 1835  BNP 191.0*   Basic Metabolic Panel: Recent Labs  Lab 07/29/23 1835 07/30/23 0430 08/01/23 0537  NA 128* 133* 138  K 4.2 4.2 4.5  CL 93* 99 104  CO2 26 26 25   GLUCOSE 123* 147* 113*  BUN 24* 23 28*  CREATININE 1.70* 1.53* 1.28*  CALCIUM 8.6* 8.7* 9.1  MG  --  2.7*  --    Liver Function Tests: Recent Labs  Lab 07/29/23 1835 07/30/23 0430  AST 32 32  ALT 17 18  ALKPHOS 63 57  BILITOT 0.8 0.5  PROT 7.6 7.1  ALBUMIN 3.3* 3.0*   No results for input(s): "LIPASE", "AMYLASE" in the last 168 hours. No results for input(s): "AMMONIA" in the last 168 hours. CBC: Recent Labs  Lab 07/29/23 1835 07/30/23 0430  WBC 9.9 6.3  NEUTROABS 8.6* 5.6  HGB 10.7* 10.5*  HCT 32.0* 32.1*  MCV 91.2 92.5  PLT 135* 117*   Cardiac Enzymes: No results for input(s): "CKTOTAL", "CKMB", "CKMBINDEX", "TROPONINI" in the last 168 hours. BNP: Invalid input(s): "POCBNP" CBG: No results for input(s): "GLUCAP" in the last 168 hours. D-Dimer No results for input(s): "DDIMER" in the last 72 hours. Hgb A1c No results for input(s): "HGBA1C" in the last 72 hours. Lipid Profile No results for input(s): "CHOL", "HDL", "LDLCALC", "TRIG", "CHOLHDL", "LDLDIRECT" in the last 72 hours. Thyroid function studies No results for  input(s): "TSH", "T4TOTAL", "T3FREE", "THYROIDAB" in the last 72 hours.  Invalid input(s): "FREET3" Anemia work up No results for input(s): "VITAMINB12", "FOLATE", "FERRITIN", "TIBC", "IRON", "RETICCTPCT" in the last 72 hours. Urinalysis    Component Value Date/Time   COLORURINE YELLOW 07/29/2023 2321   APPEARANCEUR CLEAR 07/29/2023 2321   LABSPEC 1.005 07/29/2023 2321   PHURINE 5.0 07/29/2023 2321   GLUCOSEU NEGATIVE 07/29/2023 2321   HGBUR MODERATE (A) 07/29/2023 2321   BILIRUBINUR NEGATIVE 07/29/2023 2321   KETONESUR NEGATIVE 07/29/2023 2321   PROTEINUR NEGATIVE 07/29/2023 2321   UROBILINOGEN 0.2 05/03/2012 1559   NITRITE NEGATIVE 07/29/2023 2321   LEUKOCYTESUR NEGATIVE 07/29/2023 2321   Sepsis Labs Recent Labs  Lab 07/29/23 1835 07/30/23 0430  WBC 9.9 6.3   Microbiology Recent Results (from the past 240 hour(s))  Resp panel by RT-PCR (RSV, Flu A&B, Covid) Anterior Nasal Swab     Status: Abnormal   Collection Time:  07/29/23  6:16 PM   Specimen: Anterior Nasal Swab  Result Value Ref Range Status   SARS Coronavirus 2 by RT PCR POSITIVE (A) NEGATIVE Final    Comment: (NOTE) SARS-CoV-2 target nucleic acids are DETECTED.  The SARS-CoV-2 RNA is generally detectable in upper respiratory specimens during the acute phase of infection. Positive results are indicative of the presence of the identified virus, but do not rule out bacterial infection or co-infection with other pathogens not detected by the test. Clinical correlation with patient history and other diagnostic information is necessary to determine patient infection status. The expected result is Negative.  Fact Sheet for Patients: BloggerCourse.com  Fact Sheet for Healthcare Providers: SeriousBroker.it  This test is not yet approved or cleared by the [place] FDA and  has been authorized for detection and/or diagnosis of SARS-CoV-2 by FDA under an Emergency  Use Authorization (EUA).  This EUA will remain in effect (meaning this test can be used) for the duration of  the COVID-19 declaration under Section 564(b)(1) of the A ct, 21 U.S.C. section 360bbb-3(b)(1), unless the authorization is terminated or revoked sooner.     Influenza A by PCR NEGATIVE NEGATIVE Final   Influenza B by PCR NEGATIVE NEGATIVE Final    Comment: (NOTE) The Xpert Xpress SARS-CoV-2/FLU/RSV plus assay is intended as an aid in the diagnosis of influenza from Nasopharyngeal swab specimens and should not be used as a sole basis for treatment. Nasal washings and aspirates are unacceptable for Xpert Xpress SARS-CoV-2/FLU/RSV testing.  Fact Sheet for Patients: BloggerCourse.com  Fact Sheet for Healthcare Providers: SeriousBroker.it  This test is not yet approved or cleared by the [place] FDA and has been authorized for detection and/or diagnosis of SARS-CoV-2 by FDA under an Emergency Use Authorization (EUA). This EUA will remain in effect (meaning this test can be used) for the duration of the COVID-19 declaration under Section 564(b)(1) of the Act, 21 U.S.C. section 360bbb-3(b)(1), unless the authorization is terminated or revoked.     Resp Syncytial Virus by PCR NEGATIVE NEGATIVE Final    Comment: (NOTE) Fact Sheet for Patients: BloggerCourse.com  Fact Sheet for Healthcare Providers: SeriousBroker.it  This test is not yet approved or cleared by the [place] FDA and has been authorized for detection and/or diagnosis of SARS-CoV-2 by FDA under an Emergency Use Authorization (EUA). This EUA will remain in effect (meaning this test can be used) for the duration of the COVID-19 declaration under Section 564(b)(1) of the Act, 21 U.S.C. section 360bbb-3(b)(1), unless the authorization is terminated or revoked.  Performed at [facility], [address], [place], [place] [address]   Blood Culture (routine x 2)     Status: None (Preliminary result)   Collection Time: 07/29/23  6:25 PM   Specimen: Left Antecubital; Blood  Result Value Ref Range Status   Specimen Description LEFT ANTECUBITAL  Final   Special Requests   Final    BOTTLES DRAWN AEROBIC ONLY Blood Culture results may not be optimal due to an inadequate volume of blood received in culture bottles   Culture   Final    NO GROWTH 3 DAYS Performed at [facility], [address], [place], [place] [address]    Report Status PENDING  Incomplete  Blood Culture (routine x 2)     Status: None (Preliminary result)   Collection Time: 07/29/23  6:32 PM   Specimen: BLOOD LEFT FOREARM  Result Value Ref Range Status   Specimen Description BLOOD LEFT FOREARM  Final  Special Requests   Final    BOTTLES DRAWN AEROBIC AND ANAEROBIC Blood Culture adequate volume   Culture   Final    NO GROWTH 3 DAYS Performed at [facility], [address] [place], [place] [address]    Report Status PENDING  Incomplete   Time coordinating discharge: 41 mins   SIGNED:  Standley Dakins, MD  [facility] 08/01/2023, 11:20 AM How to contact the [facility] Attending or Consulting provider 7A - 7P or covering provider during after hours 7P -7A, for this patient?  Check the care team in [facility] and look for a) attending/consulting TR[facility] provider listed and b) the [facility] team listed Log into [email or web address] and use [place]'s universal password to access. If you do not have the password, please contact the hospital operator. Locate the [facility] provider you are looking for under [facility] and page to a number that you can be directly reached. If you still have difficulty reaching the provider, please page the [facility] (Director on Call) for the Hospitalists listed on amion for assistance.

## 2023-08-01 NOTE — Progress Notes (Signed)
Pt slept through the night. Pt c/o 7/10 chronic pain. PRN Oxycodone given with relief. Vitals stable. Pt 90% on 4L.

## 2023-08-01 NOTE — TOC Transition Note (Addendum)
Transition of Care [facility]) - CM/SW Discharge Note   Patient Details  Name: Travis Woodward. MRN: [id number] Date of Birth: [birth year]  Transition of Care [facility]) CM/SW Contact:  Leitha Bleak, RN Phone Number: 08/01/2023, 1:05 PM   Clinical Narrative:   Patient discharging home, qualifying for home oxygen. TOC contacted [employer] this morning, waiting on delivery. Neb machine ordered as well. RN updated.  MD ordered HHPT/RN. Patient has no preference. Referral sent to [facility] with [place].    Final next level of care: Home/Self Care Barriers to Discharge: Barriers Resolved   Patient Goals and CMS Choice CMS Medicare.gov Compare Post Acute Care list provided to:: Patient   Discharge Placement       Patient and family notified of of transfer: 08/01/23  Discharge Plan and Services Additional resources added to the After Visit Summary for                 DME Arranged: Nebulizer/meds, Oxygen DME Agency: [employer]      Social Determinants of Health (SDOH) Interventions SDOH Screenings   Food Insecurity: No Food Insecurity (07/29/2023)  Housing: Low Risk  (07/29/2023)  Transportation Needs: No Transportation Needs (07/29/2023)  Utilities: Not At Risk (07/29/2023)  Tobacco Use: Medium Risk (08/01/2023)    Readmission Risk Interventions    08/01/2023   12:59 PM  Readmission Risk Prevention Plan  Post Dischage Appt Complete  Medication Screening Complete  Transportation Screening Complete

## 2023-08-01 NOTE — Discharge Instructions (Signed)
IMPORTANT INFORMATION: PAY CLOSE ATTENTION  ? ?PHYSICIAN DISCHARGE INSTRUCTIONS ? ?Follow with Primary care provider  Fusco, Lawrence, MD  and other consultants as instructed by your Hospitalist Physician ? ?SEEK MEDICAL CARE OR RETURN TO EMERGENCY ROOM IF SYMPTOMS COME BACK, WORSEN OR NEW PROBLEM DEVELOPS  ? ?Please note: ?You were cared for by a hospitalist during your hospital stay. Every effort will be made to forward records to your primary care provider.  You can request that your primary care provider send for your hospital records if they have not received them.  Once you are discharged, your primary care physician will handle any further medical issues. Please note that NO REFILLS for any discharge medications will be authorized once you are discharged, as it is imperative that you return to your primary care physician (or establish a relationship with a primary care physician if you do not have one) for your post hospital discharge needs so that they can reassess your need for medications and monitor your lab values. ? ?Please get a complete blood count and chemistry panel checked by your Primary MD at your next visit, and again as instructed by your Primary MD. ? ?Get Medicines reviewed and adjusted: ?Please take all your medications with you for your next visit with your Primary MD ? ?Laboratory/radiological data: ?Please request your Primary MD to go over all hospital tests and procedure/radiological results at the follow up, please ask your primary care provider to get all Hospital records sent to his/her office. ? ?In some cases, they will be blood work, cultures and biopsy results pending at the time of your discharge. Please request that your primary care provider follow up on these results. ? ?If you are diabetic, please bring your blood sugar readings with you to your follow up appointment with primary care.   ? ?Please call and make your follow up appointments as soon as possible.   ? ?Also Note  the following: ?If you experience worsening of your admission symptoms, develop shortness of breath, life threatening emergency, suicidal or homicidal thoughts you must seek medical attention immediately by calling 911 or calling your MD immediately  if symptoms less severe. ? ?You must read complete instructions/literature along with all the possible adverse reactions/side effects for all the Medicines you take and that have been prescribed to you. Take any new Medicines after you have completely understood and accpet all the possible adverse reactions/side effects.  ? ?Do not drive when taking Pain medications or sleeping medications (Benzodiazepines) ? ?Do not take more than prescribed Pain, Sleep and Anxiety Medications. It is not advisable to combine anxiety,sleep and pain medications without talking with your primary care practitioner ? ?Special Instructions: If you have smoked or chewed Tobacco  in the last 2 yrs please stop smoking, stop any regular Alcohol  and or any Recreational drug use. ? ?Wear Seat belts while driving.  Do not drive if taking any narcotic, mind altering or controlled substances or recreational drugs or alcohol.  ? ? ? ? ? ?

## 2023-08-01 NOTE — Progress Notes (Signed)
Patient was 98% on 4L while resting. Patient ambulated in the room on 4L of o2 and saturation went down to 72%. Patient did not seem in any distress. Patient was guided back to the bed and placed on 6L to get o2 back up. O2 saturation went back up to 88-91%. Respiratory was called for a breathing treatment. MD Laural Benes notified.

## 2023-08-13 ENCOUNTER — Emergency Department (HOSPITAL_COMMUNITY): Payer: Medicare HMO

## 2023-08-13 ENCOUNTER — Inpatient Hospital Stay (HOSPITAL_COMMUNITY)
Admission: EM | Admit: 2023-08-13 | Discharge: 2023-08-26 | DRG: 177 | Disposition: A | Discharge status: Home Health | Payer: Medicare HMO | Attending: Family Medicine | Admitting: Family Medicine

## 2023-08-13 ENCOUNTER — Encounter (HOSPITAL_COMMUNITY): Payer: Self-pay

## 2023-08-13 ENCOUNTER — Other Ambulatory Visit: Payer: Self-pay

## 2023-08-13 ENCOUNTER — Inpatient Hospital Stay (HOSPITAL_COMMUNITY): Payer: Medicare HMO

## 2023-08-13 DIAGNOSIS — I499 Cardiac arrhythmia, unspecified: Secondary | ICD-10-CM | POA: Diagnosis not present

## 2023-08-13 DIAGNOSIS — J310 Chronic rhinitis: Secondary | ICD-10-CM | POA: Diagnosis not present

## 2023-08-13 DIAGNOSIS — K5900 Constipation, unspecified: Secondary | ICD-10-CM | POA: Diagnosis present

## 2023-08-13 DIAGNOSIS — N401 Enlarged prostate with lower urinary tract symptoms: Secondary | ICD-10-CM | POA: Diagnosis present

## 2023-08-13 DIAGNOSIS — I10 Essential (primary) hypertension: Secondary | ICD-10-CM | POA: Diagnosis present

## 2023-08-13 DIAGNOSIS — J9600 Acute respiratory failure, unspecified whether with hypoxia or hypercapnia: Secondary | ICD-10-CM | POA: Diagnosis not present

## 2023-08-13 DIAGNOSIS — I2510 Atherosclerotic heart disease of native coronary artery without angina pectoris: Secondary | ICD-10-CM

## 2023-08-13 DIAGNOSIS — J9621 Acute and chronic respiratory failure with hypoxia: Secondary | ICD-10-CM | POA: Diagnosis not present

## 2023-08-13 DIAGNOSIS — J984 Other disorders of lung: Secondary | ICD-10-CM | POA: Diagnosis not present

## 2023-08-13 DIAGNOSIS — U071 COVID-19: Principal | ICD-10-CM | POA: Diagnosis present

## 2023-08-13 DIAGNOSIS — K5903 Drug induced constipation: Secondary | ICD-10-CM | POA: Diagnosis not present

## 2023-08-13 DIAGNOSIS — Z8616 Personal history of COVID-19: Secondary | ICD-10-CM | POA: Diagnosis not present

## 2023-08-13 DIAGNOSIS — J479 Bronchiectasis, uncomplicated: Secondary | ICD-10-CM | POA: Diagnosis not present

## 2023-08-13 DIAGNOSIS — E538 Deficiency of other specified B group vitamins: Secondary | ICD-10-CM | POA: Diagnosis present

## 2023-08-13 DIAGNOSIS — K219 Gastro-esophageal reflux disease without esophagitis: Secondary | ICD-10-CM | POA: Diagnosis present

## 2023-08-13 DIAGNOSIS — E785 Hyperlipidemia, unspecified: Secondary | ICD-10-CM | POA: Diagnosis present

## 2023-08-13 DIAGNOSIS — J1282 Pneumonia due to coronavirus disease 2019: Secondary | ICD-10-CM | POA: Diagnosis present

## 2023-08-13 DIAGNOSIS — E875 Hyperkalemia: Secondary | ICD-10-CM | POA: Diagnosis not present

## 2023-08-13 DIAGNOSIS — Z7189 Other specified counseling: Secondary | ICD-10-CM | POA: Diagnosis not present

## 2023-08-13 DIAGNOSIS — Z955 Presence of coronary angioplasty implant and graft: Secondary | ICD-10-CM

## 2023-08-13 DIAGNOSIS — R54 Age-related physical debility: Secondary | ICD-10-CM | POA: Diagnosis present

## 2023-08-13 DIAGNOSIS — I517 Cardiomegaly: Secondary | ICD-10-CM | POA: Diagnosis not present

## 2023-08-13 DIAGNOSIS — L89152 Pressure ulcer of sacral region, stage 2: Secondary | ICD-10-CM | POA: Diagnosis present

## 2023-08-13 DIAGNOSIS — J340 Abscess, furuncle and carbuncle of nose: Secondary | ICD-10-CM | POA: Diagnosis not present

## 2023-08-13 DIAGNOSIS — D696 Thrombocytopenia, unspecified: Secondary | ICD-10-CM | POA: Diagnosis not present

## 2023-08-13 DIAGNOSIS — G894 Chronic pain syndrome: Secondary | ICD-10-CM | POA: Diagnosis present

## 2023-08-13 DIAGNOSIS — N179 Acute kidney failure, unspecified: Secondary | ICD-10-CM | POA: Diagnosis not present

## 2023-08-13 DIAGNOSIS — R338 Other retention of urine: Secondary | ICD-10-CM | POA: Diagnosis not present

## 2023-08-13 DIAGNOSIS — R0902 Hypoxemia: Secondary | ICD-10-CM | POA: Diagnosis not present

## 2023-08-13 DIAGNOSIS — J069 Acute upper respiratory infection, unspecified: Secondary | ICD-10-CM | POA: Diagnosis not present

## 2023-08-13 DIAGNOSIS — R627 Adult failure to thrive: Secondary | ICD-10-CM | POA: Diagnosis present

## 2023-08-13 DIAGNOSIS — Z79899 Other long term (current) drug therapy: Secondary | ICD-10-CM

## 2023-08-13 DIAGNOSIS — J9601 Acute respiratory failure with hypoxia: Secondary | ICD-10-CM | POA: Diagnosis not present

## 2023-08-13 DIAGNOSIS — Z9861 Coronary angioplasty status: Secondary | ICD-10-CM

## 2023-08-13 DIAGNOSIS — G8929 Other chronic pain: Secondary | ICD-10-CM | POA: Diagnosis not present

## 2023-08-13 DIAGNOSIS — R0602 Shortness of breath: Secondary | ICD-10-CM | POA: Diagnosis not present

## 2023-08-13 DIAGNOSIS — D649 Anemia, unspecified: Secondary | ICD-10-CM | POA: Diagnosis present

## 2023-08-13 DIAGNOSIS — R531 Weakness: Secondary | ICD-10-CM | POA: Diagnosis not present

## 2023-08-13 DIAGNOSIS — Z888 Allergy status to other drugs, medicaments and biological substances status: Secondary | ICD-10-CM

## 2023-08-13 DIAGNOSIS — Z96642 Presence of left artificial hip joint: Secondary | ICD-10-CM | POA: Diagnosis present

## 2023-08-13 DIAGNOSIS — Z515 Encounter for palliative care: Secondary | ICD-10-CM | POA: Diagnosis not present

## 2023-08-13 DIAGNOSIS — Z9049 Acquired absence of other specified parts of digestive tract: Secondary | ICD-10-CM

## 2023-08-13 DIAGNOSIS — J8410 Pulmonary fibrosis, unspecified: Secondary | ICD-10-CM | POA: Diagnosis present

## 2023-08-13 DIAGNOSIS — Z7902 Long term (current) use of antithrombotics/antiplatelets: Secondary | ICD-10-CM

## 2023-08-13 DIAGNOSIS — E876 Hypokalemia: Secondary | ICD-10-CM | POA: Diagnosis not present

## 2023-08-13 DIAGNOSIS — F1120 Opioid dependence, uncomplicated: Secondary | ICD-10-CM | POA: Diagnosis present

## 2023-08-13 DIAGNOSIS — Z8249 Family history of ischemic heart disease and other diseases of the circulatory system: Secondary | ICD-10-CM

## 2023-08-13 DIAGNOSIS — R918 Other nonspecific abnormal finding of lung field: Secondary | ICD-10-CM | POA: Diagnosis not present

## 2023-08-13 DIAGNOSIS — I700 Atherosclerosis of aorta: Secondary | ICD-10-CM | POA: Diagnosis not present

## 2023-08-13 DIAGNOSIS — Z7982 Long term (current) use of aspirin: Secondary | ICD-10-CM

## 2023-08-13 DIAGNOSIS — R0600 Dyspnea, unspecified: Secondary | ICD-10-CM | POA: Diagnosis not present

## 2023-08-13 DIAGNOSIS — Z87891 Personal history of nicotine dependence: Secondary | ICD-10-CM

## 2023-08-13 DIAGNOSIS — Z7952 Long term (current) use of systemic steroids: Secondary | ICD-10-CM

## 2023-08-13 DIAGNOSIS — E782 Mixed hyperlipidemia: Secondary | ICD-10-CM | POA: Diagnosis not present

## 2023-08-13 LAB — CBC WITH DIFFERENTIAL/PLATELET
Abs Immature Granulocytes: 0.12 10*3/uL — ABNORMAL HIGH (ref 0.00–0.07)
Basophils Absolute: 0 10*3/uL (ref 0.0–0.1)
Basophils Relative: 0 %
Eosinophils Absolute: 0.1 10*3/uL (ref 0.0–0.5)
Eosinophils Relative: 0 %
HCT: 32.9 % — ABNORMAL LOW (ref 39.0–52.0)
Hemoglobin: 10.7 g/dL — ABNORMAL LOW (ref 13.0–17.0)
Immature Granulocytes: 1 %
Lymphocytes Relative: 3 %
Lymphs Abs: 0.4 10*3/uL — ABNORMAL LOW (ref 0.7–4.0)
MCH: 30.2 pg (ref 26.0–34.0)
MCHC: 32.5 g/dL (ref 30.0–36.0)
MCV: 92.9 fL (ref 80.0–100.0)
Monocytes Absolute: 0.8 10*3/uL (ref 0.1–1.0)
Monocytes Relative: 6 %
Neutro Abs: 13 10*3/uL — ABNORMAL HIGH (ref 1.7–7.7)
Neutrophils Relative %: 90 %
Platelets: 137 10*3/uL — ABNORMAL LOW (ref 150–400)
RBC: 3.54 MIL/uL — ABNORMAL LOW (ref 4.22–5.81)
RDW: 15.9 % — ABNORMAL HIGH (ref 11.5–15.5)
WBC: 14.4 10*3/uL — ABNORMAL HIGH (ref 4.0–10.5)
nRBC: 0 % (ref 0.0–0.2)

## 2023-08-13 LAB — COMPREHENSIVE METABOLIC PANEL
ALT: 18 U/L (ref 0–44)
AST: 19 U/L (ref 15–41)
Albumin: 2.7 g/dL — ABNORMAL LOW (ref 3.5–5.0)
Alkaline Phosphatase: 57 U/L (ref 38–126)
Anion gap: 11 (ref 5–15)
BUN: 33 mg/dL — ABNORMAL HIGH (ref 8–23)
CO2: 31 mmol/L (ref 22–32)
Calcium: 9.5 mg/dL (ref 8.9–10.3)
Chloride: 91 mmol/L — ABNORMAL LOW (ref 98–111)
Creatinine, Ser: 1.63 mg/dL — ABNORMAL HIGH (ref 0.61–1.24)
GFR, Estimated: 42 mL/min — ABNORMAL LOW (ref >60)
Glucose, Bld: 107 mg/dL — ABNORMAL HIGH (ref 70–99)
Potassium: 4.3 mmol/L (ref 3.5–5.1)
Sodium: 133 mmol/L — ABNORMAL LOW (ref 135–145)
Total Bilirubin: 1.2 mg/dL (ref 0.3–1.2)
Total Protein: 6.3 g/dL — ABNORMAL LOW (ref 6.5–8.1)

## 2023-08-13 LAB — BRAIN NATRIURETIC PEPTIDE: B Natriuretic Peptide: 71 pg/mL (ref 0.0–100.0)

## 2023-08-13 LAB — PROCALCITONIN: Procalcitonin: 0.24 ng/mL

## 2023-08-13 MED ORDER — ALBUTEROL SULFATE (2.5 MG/3ML) 0.083% IN NEBU
2.5000 mg | INHALATION_SOLUTION | RESPIRATORY_TRACT | Status: DC | PRN
  Administered 2023-08-13 – 2023-08-18 (×2): 2.5 mg via RESPIRATORY_TRACT
  Filled 2023-08-13 (×2): qty 3

## 2023-08-13 MED ORDER — WHITE PETROLATUM EX OINT
TOPICAL_OINTMENT | Freq: Two times a day (BID) | CUTANEOUS | Status: DC
  Filled 2023-08-13: qty 28.35

## 2023-08-13 MED ORDER — METHYLPREDNISOLONE SODIUM SUCC 125 MG IJ SOLR
80.0000 mg | Freq: Two times a day (BID) | INTRAMUSCULAR | Status: DC
  Administered 2023-08-14 – 2023-08-21 (×15): 80 mg via INTRAVENOUS
  Filled 2023-08-13 (×15): qty 2

## 2023-08-13 MED ORDER — PANTOPRAZOLE SODIUM 40 MG PO TBEC
40.0000 mg | DELAYED_RELEASE_TABLET | Freq: Every day | ORAL | Status: DC
  Administered 2023-08-13 – 2023-08-26 (×14): 40 mg via ORAL
  Filled 2023-08-13 (×14): qty 1

## 2023-08-13 MED ORDER — GABAPENTIN 100 MG PO CAPS
100.0000 mg | ORAL_CAPSULE | Freq: Three times a day (TID) | ORAL | Status: DC
  Administered 2023-08-13 – 2023-08-26 (×38): 100 mg via ORAL
  Filled 2023-08-13 (×38): qty 1

## 2023-08-13 MED ORDER — OXYCODONE HCL 5 MG PO TABS
30.0000 mg | ORAL_TABLET | Freq: Four times a day (QID) | ORAL | Status: DC | PRN
  Administered 2023-08-14 – 2023-08-26 (×21): 30 mg via ORAL
  Filled 2023-08-13 (×21): qty 6

## 2023-08-13 MED ORDER — ASPIRIN 81 MG PO TBEC
81.0000 mg | DELAYED_RELEASE_TABLET | Freq: Every day | ORAL | Status: DC
  Administered 2023-08-13 – 2023-08-26 (×14): 81 mg via ORAL
  Filled 2023-08-13 (×15): qty 1

## 2023-08-13 MED ORDER — IPRATROPIUM-ALBUTEROL 0.5-2.5 (3) MG/3ML IN SOLN
3.0000 mL | Freq: Three times a day (TID) | RESPIRATORY_TRACT | Status: DC
  Administered 2023-08-13 – 2023-08-23 (×29): 3 mL via RESPIRATORY_TRACT
  Filled 2023-08-13 (×29): qty 3

## 2023-08-13 MED ORDER — MAGNESIUM SULFATE 2 GM/50ML IV SOLN
2.0000 g | Freq: Once | INTRAVENOUS | Status: AC
  Administered 2023-08-13: 2 g via INTRAVENOUS
  Filled 2023-08-13: qty 50

## 2023-08-13 MED ORDER — METHYLPREDNISOLONE SODIUM SUCC 125 MG IJ SOLR
125.0000 mg | Freq: Once | INTRAMUSCULAR | Status: AC
  Administered 2023-08-13: 125 mg via INTRAVENOUS
  Filled 2023-08-13: qty 2

## 2023-08-13 MED ORDER — IPRATROPIUM BROMIDE 0.02 % IN SOLN: 0.5000 mg | Freq: Four times a day (QID) | RESPIRATORY_TRACT | Status: DC

## 2023-08-13 MED ORDER — MAGNESIUM CITRATE PO SOLN
1.0000 | Freq: Once | ORAL | Status: AC
  Administered 2023-08-13: 1 via ORAL
  Filled 2023-08-13: qty 296

## 2023-08-13 MED ORDER — ALBUTEROL SULFATE (2.5 MG/3ML) 0.083% IN NEBU: 2.5000 mg | INHALATION_SOLUTION | Freq: Four times a day (QID) | RESPIRATORY_TRACT | Status: DC

## 2023-08-13 MED ORDER — MAGNESIUM CITRATE PO SOLN
1.0000 | Freq: Once | ORAL | Status: AC | PRN
  Administered 2023-08-14: 1 via ORAL
  Filled 2023-08-13: qty 296

## 2023-08-13 MED ORDER — ENOXAPARIN SODIUM 40 MG/0.4ML IJ SOSY
40.0000 mg | PREFILLED_SYRINGE | INTRAMUSCULAR | Status: DC
  Administered 2023-08-13 – 2023-08-25 (×13): 40 mg via SUBCUTANEOUS
  Filled 2023-08-13 (×13): qty 0.4

## 2023-08-13 MED ORDER — HYDRALAZINE HCL 20 MG/ML IJ SOLN: 5.0000 mg | INTRAMUSCULAR | Status: DC | PRN

## 2023-08-13 MED ORDER — METHYLPREDNISOLONE SODIUM SUCC 125 MG IJ SOLR
80.0000 mg | Freq: Two times a day (BID) | INTRAMUSCULAR | Status: DC
Start: 2023-08-13 — End: 2023-08-13

## 2023-08-13 MED ORDER — ALBUTEROL SULFATE (2.5 MG/3ML) 0.083% IN NEBU
2.5000 mg | INHALATION_SOLUTION | Freq: Once | RESPIRATORY_TRACT | Status: AC
  Administered 2023-08-13: 2.5 mg via RESPIRATORY_TRACT
  Filled 2023-08-13: qty 3

## 2023-08-13 MED ORDER — ACETAMINOPHEN 325 MG PO TABS: 650.0000 mg | ORAL_TABLET | Freq: Four times a day (QID) | ORAL | Status: DC | PRN

## 2023-08-13 MED ORDER — VITAMIN D 25 MCG (1000 UNIT) PO TABS
5000.0000 [IU] | ORAL_TABLET | Freq: Every day | ORAL | Status: DC
  Administered 2023-08-13 – 2023-08-26 (×14): 5000 [IU] via ORAL
  Filled 2023-08-13 (×15): qty 5

## 2023-08-13 MED ORDER — ONDANSETRON HCL 4 MG PO TABS: 4.0000 mg | ORAL_TABLET | Freq: Four times a day (QID) | ORAL | Status: DC | PRN

## 2023-08-13 MED ORDER — CLOPIDOGREL BISULFATE 75 MG PO TABS
75.0000 mg | ORAL_TABLET | Freq: Every day | ORAL | Status: DC
  Administered 2023-08-13 – 2023-08-26 (×14): 75 mg via ORAL
  Filled 2023-08-13 (×14): qty 1

## 2023-08-13 MED ORDER — SALINE SPRAY 0.65 % NA SOLN
2.0000 | NASAL | Status: DC
  Administered 2023-08-13 – 2023-08-26 (×62): 2 via NASAL
  Filled 2023-08-13 (×5): qty 44

## 2023-08-13 MED ORDER — ONDANSETRON HCL 4 MG/2ML IJ SOLN: 4.0000 mg | Freq: Four times a day (QID) | INTRAMUSCULAR | Status: DC | PRN

## 2023-08-13 MED ORDER — IPRATROPIUM-ALBUTEROL 0.5-2.5 (3) MG/3ML IN SOLN
3.0000 mL | Freq: Once | RESPIRATORY_TRACT | Status: AC
  Administered 2023-08-13: 3 mL via RESPIRATORY_TRACT
  Filled 2023-08-13: qty 3

## 2023-08-13 MED ORDER — ACETAMINOPHEN 650 MG RE SUPP: 650.0000 mg | Freq: Four times a day (QID) | RECTAL | Status: DC | PRN

## 2023-08-13 MED ORDER — SENNOSIDES-DOCUSATE SODIUM 8.6-50 MG PO TABS
1.0000 | ORAL_TABLET | Freq: Every evening | ORAL | Status: DC | PRN
  Administered 2023-08-16 – 2023-08-24 (×2): 1 via ORAL
  Filled 2023-08-13 (×2): qty 1

## 2023-08-13 NOTE — ED Triage Notes (Signed)
Pt BIB EMS from home with c/o SOB that has been ongoing since admission 07/29/23- pt had Covid and PNA during admission - discharged home on 5L O2, EMS reports pt had O2 concentrator at home with approx long tubing and neb mask as pt did not have [place] tubing at home. Pt O2 on non-rebreather is 97%. Victorino Dike, RT at bedside upon pt arrival. Pt denies any acute pain.

## 2023-08-13 NOTE — H&P (Signed)
[facility] H&P   Patient Demographics:    Travis Woodward, is a 82 y.o. male  MRN: [id number]   DOB - April 21, 1941  Admit Date - 08/13/2023  Outpatient Primary MD for the patient is Elfredia Nevins, MD  Referring MD/NP/PA: DR Estell Harpin  Patient coming from: home  Chief Complaint  Patient presents with   Shortness of Breath      HPI:    Travis Woodward  is a 82 y.o. male, with medical history significant of CKD, CAD, hyperlipidemia, hypertension, hospitalization secondary to COVID infection, treated with antibiotics, steroids, patient was discharged on prednisone. -Patient presents to ED secondary to shortness of breath, patient reports he continued to have significant dyspnea upon discharge, mainly with activity, this has much worsened recently, as he was significantly dyspneic with minimal activity has not been doing much movement, he denies fever, chills, chest pain, he reports cough, but nonproductive, no nausea or vomiting  -In ED patient was noted to be in the lower 80s on his 4 L nasal cannula, and he is currently on 8 L nasal cannula at rest, chest x-ray with bilateral opacities, but BNP within normal limit 71, procalcitonin low at 0.24, he is with leukocytosis at 14.4, 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    Review of systems:      A full 10 point Review of Systems was done, except as stated above, all other Review of Systems were negative.   With Past History of the following :    Past Medical History:  Diagnosis Date   Chronic kidney disease    stage 3   Colon polyps    Complication of anesthesia    difficulty urinating    Coronary artery disease    Enlarged prostate    Hyperlipidemia    Hypertension       Past Surgical History:  Procedure Laterality Date   acd fusion & plating     BACK SURGERY     x 6   CARDIAC  CATHETERIZATION  2005   CHOLECYSTECTOMY N/A 07/19/2017   Procedure: LAPAROSCOPIC CHOLECYSTECTOMY;  Surgeon: Franky Macho, MD;  Location: [facility];  Service: General;  Laterality: N/A;   CORONARY STENT PLACEMENT  2005   HEMORRHOID SURGERY     HIP ARTHROPLASTY Left 06/29/2019   Procedure: ARTHROPLASTY BIPOLAR HIP (HEMIARTHROPLASTY);  Surgeon: Teryl Lucy, MD;  Location: [facility];  Service: Orthopedics;  Laterality: Left;   LAPAROSCOPIC APPENDECTOMY  04/17/2012   Procedure: APPENDECTOMY LAPAROSCOPIC;  Surgeon: Dalia Heading, MD;  Location: [facility];  Service: General;  Laterality: N/A;   TONSILLECTOMY        Social History:     Social History   Tobacco Use   Smoking status: Former    Current packs/day: 0.00    Types: Cigarettes    Quit date: 07/16/1972    Years since quitting: 51.1  Smokeless tobacco: Never  Substance Use Topics   Alcohol use: No     Family History :     Family History  Problem Relation Age of Onset   Heart attack Mother    Tuberculosis Maternal Grandfather    Adrenal disorder Neg Hx    Colon cancer Neg Hx    Stomach cancer Neg Hx    Pancreatic cancer Neg Hx    Esophageal cancer Neg Hx       Home Medications:   Prior to Admission medications   Medication Sig Start Date End Date Taking? Authorizing Provider  amoxicillin-clavulanate (AUGMENTIN) 875-125 MG tablet Take 1 tablet by mouth 2 (two) times daily. 02/18/23  Yes [provider]  gabapentin (NEURONTIN) 100 MG capsule Take 100 mg by mouth 3 (three) times daily. 06/23/23  Yes [provider]  levofloxacin (LEVAQUIN) 500 MG tablet Take 500 mg by mouth daily. 08/04/23  Yes [provider]  oxycodone (ROXICODONE) 30 MG immediate release tablet Take 30 mg by mouth every 6 (six) hours. 07/23/23  Yes [provider]  aspirin EC 81 MG tablet Take by mouth.    [provider]  azithromycin (ZITHROMAX) 250 MG tablet Take 250 mg by mouth daily. 07/23/23   [provider]  cholecalciferol (VITAMIN D) 1000 units tablet Take 5,000 Units by mouth daily.    [provider]  clopidogrel (PLAVIX) 75 MG tablet Take 75 mg by mouth daily. 1 Tablet Daily    [provider]  Cyanocobalamin (VITAMIN B-12 IJ) Inject 1 mL as directed every 30 (thirty) days.     [provider]  ipratropium-albuterol (DUONEB) 0.5-2.5 (3) MG/3ML SOLN Take 3 mLs by nebulization 3 (three) times daily. 08/01/23   Johnson, Clanford L, MD  nitroGLYCERIN (NITROSTAT) 0.4 MG SL tablet Place 0.4 mg under the tongue every 5 (five) minutes as needed.  06/23/17   [provider]  oxyCODONE (ROXICODONE) 15 MG immediate release tablet Take 2 tablets (30 mg total) by mouth every 6 (six) hours as needed for pain. He takes 3-4 times a day as needed 08/01/23   Standley Dakins L, MD  tadalafil (CIALIS) 20 MG tablet Take 10 mg by mouth daily as needed for erectile dysfunction. 06/25/14   [provider]     Allergies:     Allergies  Allergen Reactions   Crestor [Rosuvastatin]    Paroxetine Rash     Physical Exam:   Vitals  Blood pressure 122/75, pulse (!) 101, temperature 98.8 F (37.1 C), temperature source Oral, resp. rate (!) 23, height 6\' 2"  (1.88 m), weight 76.2 kg, SpO2 97%.   1. General *frail, deconditioned, chronically ill-appearing  2. Normal affect and insight, Not Suicidal or Homicidal, Awake Alert, Oriented X 3.  3. No F.N deficits, ALL C.Nerves Intact, Strength 5/5 all 4 extremities, Sensation intact all 4 extremities, Plantars down going.  4. Ears and Eyes appear Normal, Conjunctivae clear, PERRLA. Moist Oral Mucosa.  5. Supple Neck, No JVD, No cervical lymphadenopathy appriciated, No Carotid Bruits.  6. Symmetrical Chest wall movement, no significant rales and rhonchi in  both lungs, mainly at the bases bilaterally  7. RRR, No Gallops, Rubs or Murmurs, No Parasternal Heave.  8. Positive Bowel Sounds, Abdomen Soft, No  tenderness, No organomegaly appriciated,No rebound -guarding or rigidity.  9.  No Cyanosis, Normal Skin Turgor, No Skin Rash or Bruise.  10. Good muscle tone,  joints appear normal , no effusions, Normal ROM.  Data Review:    CBC Recent Labs  Lab 08/13/23 1644  WBC 14.4*  HGB 10.7*  HCT 32.9*  PLT 137*  MCV 92.9  MCH 30.2  MCHC 32.5  RDW 15.9*  LYMPHSABS 0.4*  MONOABS 0.8  EOSABS 0.1  BASOSABS 0.0   ------------------------------------------------------------------------------------------------------------------  Chemistries  Recent Labs  Lab 08/13/23 1644  NA 133*  K 4.3  CL 91*  CO2 31  GLUCOSE 107*  BUN 33*  CREATININE 1.63*  CALCIUM 9.5  AST 19  ALT 18  ALKPHOS 57  BILITOT 1.2   ------------------------------------------------------------------------------------------------------------------ estimated creatinine clearance is 37.7 mL/min (A) (by C-G formula based on SCr of 1.63 mg/dL (H)). ------------------------------------------------------------------------------------------------------------------ No results for input(s): "TSH", "T4TOTAL", "T3FREE", "THYROIDAB" in the last 72 hours.  Invalid input(s): "FREET3"  Coagulation profile No results for input(s): "INR", "PROTIME" in the last 168 hours. ------------------------------------------------------------------------------------------------------------------- No results for input(s): "DDIMER" in the last 72 hours. -------------------------------------------------------------------------------------------------------------------  Cardiac Enzymes No results for input(s): "CKMB", "TROPONINI", "MYOGLOBIN" in the last 168 hours.  Invalid input(s): "CK" ------------------------------------------------------------------------------------------------------------------    Component Value Date/Time   BNP 71.0 08/13/2023 1643      ---------------------------------------------------------------------------------------------------------------  Urinalysis    Component Value Date/Time   COLORURINE YELLOW 07/29/2023 2321   APPEARANCEUR CLEAR 07/29/2023 2321   LABSPEC 1.005 07/29/2023 2321   PHURINE 5.0 07/29/2023 2321   GLUCOSEU NEGATIVE 07/29/2023 2321   HGBUR MODERATE (A) 07/29/2023 2321   BILIRUBINUR NEGATIVE 07/29/2023 2321   KETONESUR NEGATIVE 07/29/2023 2321   PROTEINUR NEGATIVE 07/29/2023 2321   UROBILINOGEN 0.2 05/03/2012 1559   NITRITE NEGATIVE 07/29/2023 2321   LEUKOCYTESUR NEGATIVE 07/29/2023 2321    ----------------------------------------------------------------------------------------------------------------   Imaging Results:    CT CHEST WO CONTRAST  Result Date: 08/13/2023 CLINICAL DATA:  Worsening hypoxia, recent COVID infection. Evaluate for COVID lung versus bacterial pneumonia. EXAM: CT CHEST WITHOUT CONTRAST TECHNIQUE: Multidetector CT imaging of the chest was performed following the standard protocol without IV contrast. RADIATION DOSE REDUCTION: This exam was performed according to the departmental dose-optimization program which includes automated exposure control, adjustment of the mA and/or kV according to patient size and/or use of iterative reconstruction technique. COMPARISON:  Chest radiograph 08/13/2023; report from CT chest abdomen and pelvis 09/07/2019 (images unavailable). FINDINGS: Cardiovascular: Coronary artery and aortic atherosclerotic calcification. Trace pericardial effusion. Mediastinum/Nodes: Trace layering mucus or secretions in the posterior trachea. Unremarkable esophagus. Subcentimeter mediastinal nodes are favored reactive. Lungs/Pleura: Extensive reticular opacities and consolidation with architectural distortion and bronchiectasis greatest in the lower lobes with a predominantly peribronchovascular pattern. Additional scattered peripheral subpleural reticular and  ground-glass opacities. No pleural effusion or pneumothorax. Pleural calcifications along the anterior right upper lobe. Upper Abdomen: Cholecystectomy.  No acute abnormality. Musculoskeletal: Demineralization. Remote left rib fractures. No acute rib fracture. Presumed chronic compression fracture of T11 though this is not seen on any prior available comparisons. Compression fracture of T12 status post vertebroplasty. IMPRESSION: 1. Extensive reticular opacities and consolidation with architectural distortion and bronchiectasis greatest in the lower lobes with a predominantly peribronchovascular pattern. 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 2. Layering secretions or mucus in the posterior trachea. 3. Presumed chronic compression fracture of T11 though technically age indeterminate as this is not seen on comparison imaging. Aortic Atherosclerosis (ICD10-I70.0). Electronically Signed   By: Minerva Fester M.D.   On: 08/13/2023 19:28   DG Chest Port 1 View  Result Date: 08/13/2023 CLINICAL DATA:  Shortness of breath. EXAM: PORTABLE CHEST 1 VIEW COMPARISON:  07/31/2023 FINDINGS: Insert  upper Interstitial markings are diffusely coarsened with chronic features. Persistent basilar airspace opacity compatible with atelectasis or infiltrate. No substantial pleural effusion. No pulmonary edema. Bones are diffusely demineralized. Telemetry leads overlie the chest. IMPRESSION: Chronic interstitial coarsening with persistent bibasilar airspace disease. Electronically Signed   By: Kennith Center M.D.   On: 08/13/2023 16:35   EKG:  Vent. rate 93 BPM PR interval 131 ms QRS duration 93 ms QT/QTcB 355/442 ms P-R-T axes 73 54 27 Sinus  Assessment & Plan:    Principal Problem:   Acute on chronic hypoxic respiratory failure (HCC) Active Problems:   CAD S/P percutaneous coronary angioplasty   Hyperlipidemia   COVID-19   B12 deficiency   Acute on chronic hypoxic respiratory  failure COVID-19 infection -Patient with hospitalization due to COVID-19 infection, he was discharged on significant amount of oxygen 4 L at rest, 6 L with activity, who presents with dyspnea even at rest and increased oxygen requirement up to 8 L at rest -Think his COVID infection is the main factor due to his worsening dyspnea and hypoxia, no evidence of volume overload, BNP within normal limit, as well low clinical concern for bacterial pneumonia, most likely findings are related to COVID-19 infection and significant lung fibrosis, CT chest has been obtained to confirm that, and 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 -Start on Solu-Medrol 80 mg IV twice daily -Started on scheduled DuoNebs with as needed albuterol -He was encouraged use incentive spirometry and flutter valve -Given he is immunocompromise with prolonged steroid use, will continue with COVID precaution for 21 days.  Deconditioning-will consult PT/OT    AKI (acute kidney injury) - Will keep on gentle hydration, try to avoid volume overload given significant hypoxia   Chronic Pain/Opioid Dependence - resume home medications   DVT Prophylaxis  Lovenox   AM Labs Ordered, also please review Full Orders  Family Communication: Admission, patients condition and plan of care including tests being ordered have been discussed with the patient and wife at bedside who indicate understanding and agree with the plan and Code Status.  Code Status full  Likely DC to  home  Condition GUARDED    Consults called: none    Admission status: inpatient    Time spent in minutes : 70 minutes   Huey Bienenstock M.D on 08/13/2023 at 7:45 PM   [facility] - Office  [phone]

## 2023-08-13 NOTE — ED Provider Notes (Signed)
[place] EMERGENCY DEPARTMENT AT [facility] Provider Note   CSN: [id number] Arrival date & time: 08/13/23  1554     History {Add pertinent medical, surgical, social history, OB history to HPI:1} Chief Complaint  Patient presents with   Shortness of Breath    Travis Woodward. is a 82 y.o. male.  Patient with history of COVID-pneumonia.  He was discharged home on oxygen on August 4.  He has been coming more more shortness of breath and cannot get up and move out of bed because he becomes too short of breath.   Shortness of Breath      Home Medications Prior to Admission medications   Medication Sig Start Date End Date Taking? Authorizing Provider  aspirin EC 81 MG tablet Take by mouth.    [provider]  cholecalciferol (VITAMIN D) 1000 units tablet Take 5,000 Units by mouth daily.    [provider]  clopidogrel (PLAVIX) 75 MG tablet Take 75 mg by mouth daily. 1 Tablet Daily    [provider]  Cyanocobalamin (VITAMIN B-12 IJ) Inject 1 mL as directed every 30 (thirty) days.     [provider]  ipratropium-albuterol (DUONEB) 0.5-2.5 (3) MG/3ML SOLN Take 3 mLs by nebulization 3 (three) times daily. 08/01/23   Johnson, Clanford L, MD  nitroGLYCERIN (NITROSTAT) 0.4 MG SL tablet Place 0.4 mg under the tongue every 5 (five) minutes as needed.  06/23/17   [provider]  oxyCODONE (ROXICODONE) 15 MG immediate release tablet Take 2 tablets (30 mg total) by mouth every 6 (six) hours as needed for pain. He takes 3-4 times a day as needed 08/01/23   Cleora Fleet, MD      Allergies    Crestor [rosuvastatin] and Paroxetine    Review of Systems   Review of Systems  Respiratory:  Positive for shortness of breath.     Physical Exam Updated Vital Signs BP (!) 110/59   Pulse 97   Temp 98.8 F (37.1 C) (Oral)   Resp 15   Ht 6\' 2"  (1.88 m)   Wt 76.2 kg   SpO2 97%   BMI 21.57 kg/m  Physical Exam  ED Results /  Procedures / Treatments   Labs (all labs ordered are listed, but only abnormal results are displayed) Labs Reviewed  CBC WITH DIFFERENTIAL/PLATELET - Abnormal; Notable for the following components:      Result Value   WBC 14.4 (*)    RBC 3.54 (*)    Hemoglobin 10.7 (*)    HCT 32.9 (*)    RDW 15.9 (*)    Platelets 137 (*)    Neutro Abs 13.0 (*)    Lymphs Abs 0.4 (*)    Abs Immature Granulocytes 0.12 (*)    All other components within normal limits  COMPREHENSIVE METABOLIC PANEL - Abnormal; Notable for the following components:   Sodium 133 (*)    Chloride 91 (*)    Glucose, Bld 107 (*)    BUN 33 (*)    Creatinine, Ser 1.63 (*)    Total Protein 6.3 (*)    Albumin 2.7 (*)    GFR, Estimated 42 (*)    All other components within normal limits  PROCALCITONIN  BRAIN NATRIURETIC PEPTIDE    EKG None  Radiology DG Chest Port 1 View  Result Date: 08/13/2023 CLINICAL DATA:  Shortness of breath. EXAM: PORTABLE CHEST 1 VIEW COMPARISON:  07/31/2023 FINDINGS: Insert upper Interstitial markings are diffusely coarsened with  chronic features. Persistent basilar airspace opacity compatible with atelectasis or infiltrate. No substantial pleural effusion. No pulmonary edema. Bones are diffusely demineralized. Telemetry leads overlie the chest. IMPRESSION: Chronic interstitial coarsening with persistent bibasilar airspace disease. Electronically Signed   By: Kennith Center M.D.   On: 08/13/2023 16:35    Procedures Procedures  {Document cardiac monitor, telemetry assessment procedure when appropriate:1}  Medications Ordered in ED Medications  ipratropium-albuterol (DUONEB) 0.5-2.5 (3) MG/3ML nebulizer solution 3 mL (3 mLs Nebulization Given 08/13/23 1632)  albuterol (PROVENTIL) (2.5 MG/3ML) 0.083% nebulizer solution 2.5 mg (2.5 mg Nebulization Given 08/13/23 1632)  methylPREDNISolone sodium succinate (SOLU-MEDROL) 125 mg/2 mL injection 125 mg (125 mg Intravenous Given 08/13/23 1649)  magnesium  sulfate IVPB 2 g 50 mL (2 g Intravenous New Bag/Given 08/13/23 1653)    ED Course/ Medical Decision Making/ A&P   {Patient with O2 sats in the high 80s on his 6 L.  He improved on 8 L. Click here for ABCD2, HEART and other calculatorsREFRESH Note before signing :1}                              Medical Decision Making Amount and/or Complexity of Data Reviewed Labs: ordered. Radiology: ordered.  Risk Prescription drug management. Decision regarding hospitalization.  Patient with failure to thrive and hypoxia.  He will be admitted to medicine  {Document critical care time when appropriate:1} {Document review of labs and clinical decision tools ie heart score, Chads2Vasc2 etc:1}  {Document your independent review of radiology images, and any outside records:1} {Document your discussion with family members, caretakers, and with consultants:1} {Document social determinants of health affecting pt's care:1} {Document your decision making why or why not admission, treatments were needed:1} Final Clinical Impression(s) / ED Diagnoses Final diagnoses:  Acute respiratory failure, unspecified whether with hypoxia or hypercapnia (HCC)    Rx / DC Orders ED Discharge Orders     None

## 2023-08-13 NOTE — ED Notes (Signed)
ED TO INPATIENT HANDOFF REPORT  ED Nurse Name and Phone #:   S Name/Age/Gender Travis Woodward. 82 y.o. male Room/Bed: APA05/APA05  Code Status   Code Status: Full Code  Home/SNF/Other Home Patient oriented to: self, place, time, and situation Is this baseline? Yes   Triage Complete: Triage complete  Chief Complaint Acute on chronic hypoxic respiratory failure (HCC) [J96.21]  Triage Note Pt BIB EMS from home with c/o SOB that has been ongoing since admission 07/29/23- pt had Covid and PNA during admission - discharged home on 5L O2, EMS reports pt had O2 concentrator at home with approx long tubing and neb mask as pt did not have [place] tubing at home. Pt O2 on non-rebreather is 97%. Victorino Dike, RT at bedside upon pt arrival. Pt denies any acute pain.    Allergies Allergies  Allergen Reactions   Crestor [Rosuvastatin]    Paroxetine Rash    Level of Care/Admitting Diagnosis ED Disposition     ED Disposition  Admit   Condition  --   Comment  Hospital Area: [facility] [[id number]]  Level of Care: Telemetry [5]  Covid Evaluation: Confirmed COVID Positive  Diagnosis: Acute on chronic hypoxic respiratory failure [name]) [[id number]]  Admitting Physician: Chiquita Loth  Attending Physician: Starleen Arms [[id number]]  Certification:: I certify this patient will need inpatient services for at least 2 midnights  Expected Medical Readiness: 08/17/2023          B Medical/Surgery History Past Medical History:  Diagnosis Date   Chronic kidney disease    stage 3   Colon polyps    Complication of anesthesia    difficulty urinating    Coronary artery disease    Enlarged prostate    Hyperlipidemia    Hypertension    Past Surgical History:  Procedure Laterality Date   acd fusion & plating     BACK SURGERY     x 6   CARDIAC CATHETERIZATION  2005   CHOLECYSTECTOMY N/A 07/19/2017   Procedure: LAPAROSCOPIC CHOLECYSTECTOMY;  Surgeon: Franky Macho, MD;   Location: [facility];  Service: General;  Laterality: N/A;   CORONARY STENT PLACEMENT  2005   HEMORRHOID SURGERY     HIP ARTHROPLASTY Left 06/29/2019   Procedure: ARTHROPLASTY BIPOLAR HIP (HEMIARTHROPLASTY);  Surgeon: Teryl Lucy, MD;  Location: [facility];  Service: Orthopedics;  Laterality: Left;   LAPAROSCOPIC APPENDECTOMY  04/17/2012   Procedure: APPENDECTOMY LAPAROSCOPIC;  Surgeon: Dalia Heading, MD;  Location: [facility];  Service: General;  Laterality: N/A;   TONSILLECTOMY       A IV Location/Drains/Wounds Patient Lines/Drains/Airways Status     Active Line/Drains/Airways     Name Placement date Placement time Site Days   Peripheral IV 08/13/23 20 G Right Antecubital 08/13/23  1616  Antecubital  less than 1   Peripheral IV 08/13/23 18 G Left Antecubital 08/13/23  1616  Antecubital  less than 1            Intake/Output Last 24 hours  Intake/Output Summary (Last 24 hours) at 08/13/2023 1947 Last data filed at 08/13/2023 1815 Gross per 24 hour  Intake 50 ml  Output --  Net 50 ml    Labs/Imaging Results for orders placed or performed during the hospital encounter of 08/13/23 (from the past 48 hour(s))  Procalcitonin     Status: None   Collection Time: 08/13/23  4:43 PM  Result Value Ref Range   Procalcitonin 0.24 ng/mL    Comment:  Interpretation: PCT (Procalcitonin) <= 0.5 ng/mL: Systemic infection (sepsis) is not likely. Local bacterial infection is possible. (NOTE)       Sepsis PCT Algorithm           Lower Respiratory Tract                                      Infection PCT Algorithm    ----------------------------     ----------------------------         PCT < 0.25 ng/mL                PCT < 0.10 ng/mL          Strongly encourage             Strongly discourage   discontinuation of antibiotics    initiation of antibiotics    ----------------------------     -----------------------------       PCT 0.25 - 0.50 ng/mL            PCT 0.10 - 0.25 ng/mL                OR       >80% decrease in PCT            Discourage initiation of                                            antibiotics      Encourage discontinuation           of antibiotics    ----------------------------     -----------------------------         PCT >= 0.50 ng/mL              PCT 0.26 - 0.50 ng/mL               AND        <80% decrease in PCT             Encourage initiation of                                             antibiotics       Encourage continuation           of antibiotics    ----------------------------     -----------------------------        PCT >= 0.50 ng/mL                  PCT > 0.50 ng/mL               AND         increase in PCT                  Strongly encourage                                      initiation of antibiotics    Strongly encourage escalation           of antibiotics                                     -----------------------------  PCT <= 0.25 ng/mL                                                 OR                                        > 80% decrease in PCT                                      Discontinue / Do not initiate                                             antibiotics  Performed at [facility], [address], [place], [place] [address]   Brain natriuretic peptide     Status: None   Collection Time: 08/13/23  4:43 PM  Result Value Ref Range   B Natriuretic Peptide 71.0 0.0 - 100.0 pg/mL    Comment: Performed at [facility], [address] [place], [place] [address]  CBC with Differential     Status: Abnormal   Collection Time: 08/13/23  4:44 PM  Result Value Ref Range   WBC 14.4 (H) 4.0 - 10.5 K/uL   RBC 3.54 (L) 4.22 - 5.81 MIL/uL   Hemoglobin 10.7 (L) 13.0 - 17.0 g/dL   HCT 09.8 (L) 11.9 - 14.7 %   MCV 92.9 80.0 - 100.0 fL   MCH 30.2 26.0 - 34.0 pg   MCHC 32.5 30.0 - 36.0 g/dL   RDW 82.9 (H) 56.2 - 13.0 %   Platelets 137 (L) 150 - 400 K/uL    Comment: REPEATED TO VERIFY    nRBC 0.0 0.0 - 0.2 %   Neutrophils Relative % 90 %   Neutro Abs 13.0 (H) 1.7 - 7.7 K/uL   Lymphocytes Relative 3 %   Lymphs Abs 0.4 (L) 0.7 - 4.0 K/uL   Monocytes Relative 6 %   Monocytes Absolute 0.8 0.1 - 1.0 K/uL   Eosinophils Relative 0 %   Eosinophils Absolute 0.1 0.0 - 0.5 K/uL   Basophils Relative 0 %   Basophils Absolute 0.0 0.0 - 0.1 K/uL   WBC Morphology MORPHOLOGY UNREMARKABLE    RBC Morphology MORPHOLOGY UNREMARKABLE    Smear Review MORPHOLOGY UNREMARKABLE    Immature Granulocytes 1 %   Abs Immature Granulocytes 0.12 (H) 0.00 - 0.07 K/uL    Comment: Performed at [facility], [address], [place], [place] [address]  Comprehensive metabolic panel     Status: Abnormal   Collection Time: 08/13/23  4:44 PM  Result Value Ref Range   Sodium 133 (L) 135 - 145 mmol/L   Potassium 4.3 3.5 - 5.1 mmol/L   Chloride 91 (L) 98 - 111 mmol/L   CO2 31 22 - 32 mmol/L   Glucose, Bld 107 (H) 70 - 99 mg/dL    Comment: Glucose reference range applies only to samples taken after fasting for at least 8 hours.   BUN 33 (H) 8 - 23 mg/dL   Creatinine, Ser 4.69 (H) 0.61 - 1.24 mg/dL  Calcium 9.5 8.9 - 10.3 mg/dL   Total Protein 6.3 (L) 6.5 - 8.1 g/dL   Albumin 2.7 (L) 3.5 - 5.0 g/dL   AST 19 15 - 41 U/L   ALT 18 0 - 44 U/L   Alkaline Phosphatase 57 38 - 126 U/L   Total Bilirubin 1.2 0.3 - 1.2 mg/dL   GFR, Estimated 42 (L) >60 mL/min    Comment: (NOTE) Calculated using the CKD-EPI Creatinine Equation (2021)    Anion gap 11 5 - 15    Comment: Performed at [facility], [address] [place], [place] [address]   CT CHEST WO CONTRAST  Result Date: 08/13/2023 CLINICAL DATA:  Worsening hypoxia, recent COVID infection. Evaluate for COVID lung versus bacterial pneumonia. EXAM: CT CHEST WITHOUT CONTRAST TECHNIQUE: Multidetector CT imaging of the chest was performed following the standard protocol without IV contrast. RADIATION DOSE REDUCTION: This exam was performed according to the  departmental dose-optimization program which includes automated exposure control, adjustment of the mA and/or kV according to patient size and/or use of iterative reconstruction technique. COMPARISON:  Chest radiograph 08/13/2023; report from CT chest abdomen and pelvis 09/07/2019 (images unavailable). FINDINGS: Cardiovascular: Coronary artery and aortic atherosclerotic calcification. Trace pericardial effusion. Mediastinum/Nodes: Trace layering mucus or secretions in the posterior trachea. Unremarkable esophagus. Subcentimeter mediastinal nodes are favored reactive. Lungs/Pleura: Extensive reticular opacities and consolidation with architectural distortion and bronchiectasis greatest in the lower lobes with a predominantly peribronchovascular pattern. Additional scattered peripheral subpleural reticular and ground-glass opacities. No pleural effusion or pneumothorax. Pleural calcifications along the anterior right upper lobe. Upper Abdomen: Cholecystectomy.  No acute abnormality. Musculoskeletal: Demineralization. Remote left rib fractures. No acute rib fracture. Presumed chronic compression fracture of T11 though this is not seen on any prior available comparisons. Compression fracture of T12 status post vertebroplasty. IMPRESSION: 1. Extensive reticular opacities and consolidation with architectural distortion and bronchiectasis greatest in the lower lobes with a predominantly peribronchovascular pattern. 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 2. Layering secretions or mucus in the posterior trachea. 3. Presumed chronic compression fracture of T11 though technically age indeterminate as this is not seen on comparison imaging. Aortic Atherosclerosis (ICD10-I70.0). Electronically Signed   By: Minerva Fester M.D.   On: 08/13/2023 19:28   DG Chest Port 1 View  Result Date: 08/13/2023 CLINICAL DATA:  Shortness of breath. EXAM: PORTABLE CHEST 1 VIEW COMPARISON:  07/31/2023  FINDINGS: Insert upper Interstitial markings are diffusely coarsened with chronic features. Persistent basilar airspace opacity compatible with atelectasis or infiltrate. No substantial pleural effusion. No pulmonary edema. Bones are diffusely demineralized. Telemetry leads overlie the chest. IMPRESSION: Chronic interstitial coarsening with persistent bibasilar airspace disease. Electronically Signed   By: Kennith Center M.D.   On: 08/13/2023 16:35    Pending Labs [employer] (From admission, onward)     Start     Ordered   Signed and Armed forces training and education officer morning,   R        Signed and Held   Signed and Held  CBC  Tomorrow morning,   R        Signed and Held            Vitals/Pain Today's Vitals   08/13/23 1615 08/13/23 1619 08/13/23 1800 08/13/23 1830  BP: (!) 110/59  117/62 122/75  Pulse: 96 97 89 (!) 101  Resp: 18 15 13  (!) 23  Temp:      TempSrc:      SpO2: Marland Kitchen)  88% 97% 96% 97%  Weight:      Height:      PainSc:        Isolation Precautions Airborne precautions  Medications Medications  ipratropium-albuterol (DUONEB) 0.5-2.5 (3) MG/3ML nebulizer solution 3 mL (3 mLs Nebulization Given 08/13/23 1632)  albuterol (PROVENTIL) (2.5 MG/3ML) 0.083% nebulizer solution 2.5 mg (2.5 mg Nebulization Given 08/13/23 1632)  methylPREDNISolone sodium succinate (SOLU-MEDROL) 125 mg/2 mL injection 125 mg (125 mg Intravenous Given 08/13/23 1649)  magnesium sulfate IVPB 2 g 50 mL (0 g Intravenous Stopped 08/13/23 1815)    Mobility walks with device     Focused Assessments    R Recommendations: See Admitting Provider Note  Report given to:   Additional Notes:

## 2023-08-13 NOTE — ED Notes (Signed)
Dr Zammit at bedside. 

## 2023-08-14 DIAGNOSIS — J9621 Acute and chronic respiratory failure with hypoxia: Secondary | ICD-10-CM

## 2023-08-14 DIAGNOSIS — K5903 Drug induced constipation: Secondary | ICD-10-CM

## 2023-08-14 DIAGNOSIS — J8410 Pulmonary fibrosis, unspecified: Secondary | ICD-10-CM | POA: Diagnosis not present

## 2023-08-14 DIAGNOSIS — G8929 Other chronic pain: Secondary | ICD-10-CM

## 2023-08-14 DIAGNOSIS — U071 COVID-19: Secondary | ICD-10-CM | POA: Diagnosis not present

## 2023-08-14 LAB — CBC
HCT: 31.1 % — ABNORMAL LOW (ref 39.0–52.0)
Hemoglobin: 10.1 g/dL — ABNORMAL LOW (ref 13.0–17.0)
MCH: 30.1 pg (ref 26.0–34.0)
MCHC: 32.5 g/dL (ref 30.0–36.0)
MCV: 92.6 fL (ref 80.0–100.0)
Platelets: 117 10*3/uL — ABNORMAL LOW (ref 150–400)
RBC: 3.36 MIL/uL — ABNORMAL LOW (ref 4.22–5.81)
RDW: 15.8 % — ABNORMAL HIGH (ref 11.5–15.5)
WBC: 7 10*3/uL (ref 4.0–10.5)
nRBC: 0 % (ref 0.0–0.2)

## 2023-08-14 LAB — BASIC METABOLIC PANEL
Anion gap: 9 (ref 5–15)
BUN: 37 mg/dL — ABNORMAL HIGH (ref 8–23)
CO2: 31 mmol/L (ref 22–32)
Calcium: 9.9 mg/dL (ref 8.9–10.3)
Chloride: 95 mmol/L — ABNORMAL LOW (ref 98–111)
Creatinine, Ser: 1.53 mg/dL — ABNORMAL HIGH (ref 0.61–1.24)
GFR, Estimated: 45 mL/min — ABNORMAL LOW (ref >60)
Glucose, Bld: 136 mg/dL — ABNORMAL HIGH (ref 70–99)
Potassium: 4.1 mmol/L (ref 3.5–5.1)
Sodium: 135 mmol/L (ref 135–145)

## 2023-08-14 MED ORDER — DM-GUAIFENESIN ER 30-600 MG PO TB12
1.0000 | ORAL_TABLET | Freq: Two times a day (BID) | ORAL | Status: DC
  Administered 2023-08-14 – 2023-08-26 (×25): 1 via ORAL
  Filled 2023-08-14 (×25): qty 1

## 2023-08-14 MED ORDER — DIMETHICONE 6 % EX CREA
1.0000 | TOPICAL_CREAM | CUTANEOUS | Status: DC | PRN
  Filled 2023-08-14: qty 1

## 2023-08-14 MED ORDER — SORBITOL 70 % SOLN
500.0000 mL | TOPICAL_OIL | Freq: Once | ORAL | Status: AC
  Administered 2023-08-14: 500 mL via RECTAL
  Filled 2023-08-14: qty 150

## 2023-08-14 MED ORDER — POLYETHYLENE GLYCOL 3350 17 G PO PACK
17.0000 g | PACK | Freq: Every day | ORAL | Status: DC
  Administered 2023-08-14 – 2023-08-26 (×13): 17 g via ORAL
  Filled 2023-08-14 (×12): qty 1

## 2023-08-14 MED ORDER — DOCUSATE SODIUM 100 MG PO CAPS
100.0000 mg | ORAL_CAPSULE | Freq: Two times a day (BID) | ORAL | Status: DC
  Administered 2023-08-14 – 2023-08-26 (×24): 100 mg via ORAL
  Filled 2023-08-14 (×24): qty 1

## 2023-08-14 NOTE — Progress Notes (Signed)
PT Cancellation Note  Patient Details Name: Travis Woodward. MRN: [id number] DOB: April 19, 1941   Cancelled Treatment:    Reason Eval/Treat Not Completed: Patient not medically ready;Medical issues which prohibited therapy  PT attempted evaluation, LPN and RT present. Reporting pt not medically stable enough, with SpO2  varying and dropping into 70s, with O2.   PT will hold and continue follow pt's status for safe and appropriate evaluation.   Nelida Meuse 08/14/2023, 10:14 AM

## 2023-08-14 NOTE — Progress Notes (Signed)
   08/14/23 1543  TOC Brief Assessment  Insurance and Status Reviewed  Patient has primary care physician Yes  Home environment has been reviewed Home with spouse  Prior level of function: Assistance  Prior/Current Home Services No current home services  Social Determinants of Health Reivew SDOH reviewed no interventions necessary  Readmission risk has been reviewed Yes (Yellow)  Transition of care needs no transition of care needs at this time

## 2023-08-14 NOTE — Progress Notes (Signed)
Orders for SMOG enema per patient request. Pt complains with severe constipation and reports he has not had a stool in 1-2 weeks. Pt reports he normally goes every other day or sometimes twice daily. Abdomen is soft and non tender his bowels are active. SMOG enema given pt denies any SOB at the time of enema. Pt had medium BM soft and liquid brown in color with some blood. Hemorrhoids present. Pt's O2 dropped into 70's and nurse increased O2 from 6L to 8L. O2 sats remained unstable so nurse increased O2 to 15L as recommended by respiratory. Respiratory informed and coming to bedside. O2 reading 92 on telemetry continuous pulse ox at this present time. Pt still asymptomatic. MD made aware.Vitals: BP: 105/54 HR 79 O2 93 on 15L T 97.9

## 2023-08-14 NOTE — Progress Notes (Signed)
Progress Note   Patient: Travis Woodward. YTK:[id number] DOB: Jan 29, 1941 DOA: 08/13/2023     1 DOS: the patient was seen and examined on 08/14/2023   Brief hospital admission narrative: As per H&P written by Dr. Randol Kern on 08/13/2023 Travis Woodward  is a 82 y.o. male, with medical history significant of CKD, CAD, hyperlipidemia, hypertension, hospitalization secondary to COVID infection, treated with antibiotics, steroids, patient was discharged on prednisone. -Patient presents to ED secondary to shortness of breath, patient reports he continued to have significant dyspnea upon discharge, mainly with activity, this has much worsened recently, as he was significantly dyspneic with minimal activity has not been doing much movement, he denies fever, chills, chest pain, he reports cough, but nonproductive, no nausea or vomiting  -In ED patient was noted to be in the lower 80s on his 4 L nasal cannula, and he is currently on 8 L nasal cannula at rest, chest x-ray with bilateral opacities, but BNP within normal limit 71, procalcitonin low at 0.24, he is with leukocytosis at 14.4, 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  2-deconditioning -Follow PT/OT recommendations -Patient weak, frail and deconditioned.  3-chronic pain syndrome -Continue home analgesics -Will be judicious with narcotic management in the setting of worsening respiratory status  4-stage II pressure injury -Continue frequent repositioning -Trapeze bar has been  ordered -Preventive measures to be applied.  5-GERD/GI prophylaxis -Continue PPI  6-constipation -In the setting of narcotic usage -Continue the use of MiraLAX, Colace -Patient is status post enema and magnesium citrate with some improvement.  Subjective:  No chest pain, chronically ill and deconditioned.  Reporting short winded sensation with minimal activity and requiring up to 15 L nasal cannula supplementation.  Physical Exam: Vitals:   08/14/23 0707 08/14/23 1307 08/14/23 1413 08/14/23 1708  BP:   118/71   Pulse:   80   Resp:      Temp:   97.7 F (36.5 C)   TempSrc:   Oral   SpO2: 93% 99% (!) 88% (!) 85%  Weight:      Height:       General exam: Alert, awake, oriented x 3; short winded with activity; speaking in full sentences at rest.  Requiring up to 15 L nasal cannula supplementation.  Intermittent coughing spells currently without sputum production. Respiratory system: Positive rhonchi bilaterally; fair air movement bilaterally.  No using accessory muscles at rest. Cardiovascular system: Rate controlled, no rubs, no gallops, no JVD. Gastrointestinal system: Abdomen is nondistended, soft and nontender. No organomegaly or masses felt. Normal bowel sounds heard. Central nervous system: Alert and oriented. No focal neurological deficits. Extremities: No cyanosis or clubbing. Skin: No petechiae; stage II decubitus ulcer present on exam.  No signs of superimposed infection appreciated. Psychiatry: Judgement and insight appear normal. Mood & affect appropriate.   Data Reviewed: Basic metabolic panel: Sodium 135, potassium 4.1, chloride 95, glucose 136, BUN 37, creatinine 1.53 and GFR 45. CBC:WBC 7.0, hemoglobin 10.1 and platelet count 1 17K.  Family Communication: Wife at bedside.  Disposition: Status is: Inpatient Remains inpatient appropriate because: Continue treatment for pulmonary fibrosis and hypoxia.  Planned Discharge Destination: Home  Time spent: 50  minutes  Author: Vassie Loll, MD 08/14/2023 7:00 PM  For on call review [email or web address].

## 2023-08-15 DIAGNOSIS — J9621 Acute and chronic respiratory failure with hypoxia: Secondary | ICD-10-CM | POA: Diagnosis not present

## 2023-08-15 DIAGNOSIS — U071 COVID-19: Secondary | ICD-10-CM | POA: Diagnosis not present

## 2023-08-15 DIAGNOSIS — J8410 Pulmonary fibrosis, unspecified: Secondary | ICD-10-CM | POA: Diagnosis not present

## 2023-08-15 DIAGNOSIS — K5903 Drug induced constipation: Secondary | ICD-10-CM | POA: Diagnosis not present

## 2023-08-15 NOTE — Progress Notes (Signed)
PT Cancellation Note  Patient Details Name: Travis Woodward. MRN: [id number] DOB: Sep 06, 1941   Cancelled Treatment:    Reason Eval/Treat Not Completed: Patient not medically ready;Medical issues which prohibited therapy  PT approaching RN for Evaluation. RN continued to report instability in vitals and deferring PT Exam at current level. PT will continue to follow pt and evaluate when appropriate.   Nelida Meuse 08/15/2023, 10:40 AM

## 2023-08-15 NOTE — Progress Notes (Signed)
Progress Note   Patient: Travis Woodward. WGN:[id number] DOB: 1941/05/15 DOA: 08/13/2023     2 DOS: the patient was seen and examined on 08/15/2023   Brief hospital admission narrative: As per H&P written by Dr. Randol Kern on 08/13/2023 Travis Woodward  is a 82 y.o. male, with medical history significant of CKD, CAD, hyperlipidemia, hypertension, hospitalization secondary to COVID infection, treated with antibiotics, steroids, patient was discharged on prednisone. -Patient presents to ED secondary to shortness of breath, patient reports he continued to have significant dyspnea upon discharge, mainly with activity, this has much worsened recently, as he was significantly dyspneic with minimal activity has not been doing much movement, he denies fever, chills, chest pain, he reports cough, but nonproductive, no nausea or vomiting  -In ED patient was noted to be in the lower 80s on his 4 L nasal cannula, and he is currently on 8 L nasal cannula at rest, chest x-ray with bilateral opacities, but BNP within normal limit 71, procalcitonin low at 0.24, he is with leukocytosis at 14.4, 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    Assessment and Plan: 1-acute on chronic hypoxic respiratory failure -Chronically on 4 L nasal cannula supplementation -On presentation requiring up to 10 L; patient will need 6 L at least at time of discharge to facilitate oxygen supplementation as an outpatient. 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  2-deconditioning -Follow PT/OT recommendations -Patient weak, frail and deconditioned.  3-chronic pain syndrome -Continue home analgesics -Will be judicious with narcotic management  in the setting of worsening respiratory status  4-stage II pressure injury -Continue frequent repositioning -Trapeze bar has been ordered in order to facilitate repositioning while in bed. -Continue preventive measures.  5-GERD/GI prophylaxis -Continue PPI -Lifestyle changes discussed with patient.  6-constipation -In the setting of narcotic usage -Continue the use of MiraLAX, Colace -Reports having further bowel movements overnight. -Continue to maintain adequate hydration. -If needed will initiate treatment with Linzess.  Subjective:  No chest pain, no nausea or vomiting.  Reports feeling better.  Tachypneic and short winded with activity.  10 L nasal cannula supplementation in place.  Physical Exam: Vitals:   08/15/23 0336 08/15/23 0720 08/15/23 1221 08/15/23 1500  BP: (!) 101/56  104/64   Pulse: 65  71   Resp: 16  18   Temp: 97.7 F (36.5 C)  97.6 F (36.4 C)   TempSrc: Oral  Oral   SpO2: 96% 95% 97% 98%  Weight:      Height:       General exam: Alert, awake, oriented x 3; feeling better and reports improvement in his breathing.  Still tachypneic/short winded with activity.  Requiring 10 L nasal cannula supplementation.  Patient reports having bowel movement. Respiratory system: Bilateral rhonchi appreciated on exam; no using accessory muscles.  Positive tachypnea with exertion. Cardiovascular system:RRR. No rubs or gallops; positive systolic murmur. Gastrointestinal system: Abdomen is nondistended, soft and nontender. No organomegaly or masses felt. Normal bowel sounds heard. Central nervous system: Alert and oriented. No focal neurological deficits. Extremities: No cyanosis or clubbing. Skin: No petechiae; stage II decubitus ulcer present at time of admission.  No signs of superimposed infection. Psychiatry: Judgement and insight appear normal. Mood & affect appropriate.   Latest data Reviewed: Basic metabolic panel: Sodium 135,  potassium 4.1, chloride 95, glucose  136, BUN 37, creatinine 1.53 and GFR 45. CBC:WBC 7.0, hemoglobin 10.1 and platelet count 1 17K.  Family Communication: Wife at bedside.  Disposition: Status is: Inpatient Remains inpatient appropriate because: Continue treatment for pulmonary fibrosis and hypoxia.   Planned Discharge Destination: Home  Time spent: 50 minutes  Author: Vassie Loll, MD 08/15/2023 4:29 PM  For on call review [email or web address].

## 2023-08-15 NOTE — Progress Notes (Signed)
Pt's O2 continues to improve 97% on 8L HF. Pt states he feels better. States he has been doing incentive spirometry every other hour reaching 1,000.

## 2023-08-16 DIAGNOSIS — J9621 Acute and chronic respiratory failure with hypoxia: Secondary | ICD-10-CM | POA: Diagnosis not present

## 2023-08-16 DIAGNOSIS — K5903 Drug induced constipation: Secondary | ICD-10-CM | POA: Diagnosis not present

## 2023-08-16 DIAGNOSIS — U071 COVID-19: Secondary | ICD-10-CM | POA: Diagnosis not present

## 2023-08-16 DIAGNOSIS — J8410 Pulmonary fibrosis, unspecified: Secondary | ICD-10-CM | POA: Diagnosis not present

## 2023-08-16 LAB — BASIC METABOLIC PANEL
Anion gap: 8 (ref 5–15)
BUN: 41 mg/dL — ABNORMAL HIGH (ref 8–23)
CO2: 32 mmol/L (ref 22–32)
Calcium: 9.7 mg/dL (ref 8.9–10.3)
Chloride: 94 mmol/L — ABNORMAL LOW (ref 98–111)
Creatinine, Ser: 1.51 mg/dL — ABNORMAL HIGH (ref 0.61–1.24)
GFR, Estimated: 46 mL/min — ABNORMAL LOW (ref >60)
Glucose, Bld: 138 mg/dL — ABNORMAL HIGH (ref 70–99)
Potassium: 5 mmol/L (ref 3.5–5.1)
Sodium: 134 mmol/L — ABNORMAL LOW (ref 135–145)

## 2023-08-16 LAB — CBC
HCT: 29.7 % — ABNORMAL LOW (ref 39.0–52.0)
Hemoglobin: 9.5 g/dL — ABNORMAL LOW (ref 13.0–17.0)
MCH: 29.8 pg (ref 26.0–34.0)
MCHC: 32 g/dL (ref 30.0–36.0)
MCV: 93.1 fL (ref 80.0–100.0)
Platelets: 114 10*3/uL — ABNORMAL LOW (ref 150–400)
RBC: 3.19 MIL/uL — ABNORMAL LOW (ref 4.22–5.81)
RDW: 15.9 % — ABNORMAL HIGH (ref 11.5–15.5)
WBC: 13.3 10*3/uL — ABNORMAL HIGH (ref 4.0–10.5)
nRBC: 0 % (ref 0.0–0.2)

## 2023-08-16 NOTE — Progress Notes (Signed)
OT Cancellation Note  Patient Details Name: Travis Woodward. MRN: [id number] DOB: 04-09-1941   Cancelled Treatment:    Reason Eval/Treat Not Completed: Other (comment). This therapist was about to enter the room when NT exited the room and informed this therapist that the pt had just been sat up to eat breakfast. Will attempt evaluation later as timer permits.   Milinda Sweeney OT, MOT   Danie Chandler 08/16/2023, 8:47 AM

## 2023-08-16 NOTE — Progress Notes (Signed)
Progress Note   Patient: Travis Woodward. VOZ:[id number] DOB: 01/29/1941 DOA: 08/13/2023     3 DOS: the patient was seen and examined on 08/16/2023   Brief hospital admission narrative: As per H&P written by Dr. Randol Kern on 08/13/2023 Cher Waterhouse  is a 82 y.o. male, with medical history significant of CKD, CAD, hyperlipidemia, hypertension, hospitalization secondary to COVID infection, treated with antibiotics, steroids, patient was discharged on prednisone. -Patient presents to ED secondary to shortness of breath, patient reports he continued to have significant dyspnea upon discharge, mainly with activity, this has much worsened recently, as he was significantly dyspneic with minimal activity has not been doing much movement, he denies fever, chills, chest pain, he reports cough, but nonproductive, no nausea or vomiting  -In ED patient was noted to be in the lower 80s on his 4 L nasal cannula, and he is currently on 8 L nasal cannula at rest, chest x-ray with bilateral opacities, but BNP within normal limit 71, procalcitonin low at 0.24, he is with leukocytosis at 14.4, 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    Assessment and Plan: 1-acute on chronic hypoxic respiratory failure -Chronically on 4 L nasal cannula supplementation -On presentation requiring up to 10-15 L; patient will need 6 L at least at time of discharge to facilitate oxygen supplementation as an outpatient. 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 -Slowly responding; down to 7 L currently.  2-deconditioning -Follow PT/OT recommendations -Patient weak, frail and deconditioned. -Trapeze bar for his bed requested in order to  facilitate repositioning and physical activity.  3-chronic pain syndrome -Continue home analgesics -Will be judicious with narcotic management in the setting of worsening respiratory status  4-stage II pressure injury -Continue frequent repositioning -Trapeze bar has been ordered in order to facilitate repositioning while in bed. -Continue preventive measures.  5-GERD/GI prophylaxis -Continue PPI -Lifestyle changes discussed with patient.  6-constipation -In the setting of narcotic usage -Continue the use of MiraLAX, Colace -Reports having further bowel movements overnight. -Continue to maintain adequate hydration. -If needed will initiate treatment with Linzess.  Subjective:  No fever, no chest pain, no nausea, no vomiting.  Short winded at deconditioning with exertion; requiring 7 L nasal cannula supplementation.  Expressed that he is slowly getting better.  Physical Exam: Vitals:   08/15/23 2123 08/16/23 0619 08/16/23 0822 08/16/23 1334  BP: (!) 98/51 (!) 103/52    Pulse: 67 63    Resp: 20     Temp: 98.2 F (36.8 C) 98.1 F (36.7 C)    TempSrc: Oral Oral    SpO2: 94% 90% 91% 91%  Weight:      Height:       General exam: Alert, awake, oriented x 3; feeling better and breathing easier; down to 7 L nasal cannula supplementation. Respiratory system: Positive rhonchi bilaterally; no using accessory muscles.  Still expressing short winded sensation with activity. Cardiovascular system: Rate controlled, no rubs, no gallops, no JVD. Gastrointestinal system: Abdomen is nondistended, soft and nontender. No organomegaly or masses felt. Normal bowel sounds heard. Central nervous system: Alert and oriented. No focal neurological deficits. Extremities: No cyanosis or clubbing. Skin: No petechiae;.  Stage II decubitus ulcer present at time of admission without signs of superimposed infection. Psychiatry: Judgement and insight appear normal. Mood &  affect appropriate.   Latest data  Reviewed: CBC: WBC 13.3, hemoglobin 9.5 platelet count 1 14K Basic metabolic: Sodium 409, potassium 5.0, chloride 94, bicarb 32, glucose 138, BUN 41, creatinine 1.51 and GFR 46.  Family Communication: Wife at bedside.  Disposition: Status is: Inpatient Remains inpatient appropriate because: Continue treatment for pulmonary fibrosis and hypoxia.   Planned Discharge Destination: Home  Time spent: 50 minutes  Author: Vassie Loll, MD 08/16/2023 6:30 PM  For on call review [email or web address].

## 2023-08-16 NOTE — TOC Initial Note (Signed)
Transition of Care (TOC) - Initial/Assessment Note    Patient Details  Name: Travis Woodward. MRN: [id number] Date of Birth: 05-12-1941  Transition of Care [facility]) CM/SW Contact:    Villa Herb, LCSWA Phone Number: 08/16/2023, 1:12 PM  Clinical Narrative:                 CSW spoke with pts wife to complete assessment. Pt lives with his spouse and is normally able to complete his ADLs independently. Pt has been driving to appointments. Pt has not had HH but they are interested in this being set up as PT recommends it, they do not have an agency preference. Pt has home O2 recently set up with [facility]. Pts wife states pt has a walker but they need a wheelchair. CSW to request [facility] DME order. TOC to follow.   Expected Discharge Plan: Home w Home Health Services Barriers to Discharge: Continued Medical Work up   Patient Goals and CMS Choice Patient states their goals for this hospitalization and ongoing recovery are:: return home CMS Medicare.gov Compare Post Acute Care list provided to:: Patient Choice offered to / list presented to : Spouse      Expected Discharge Plan and Services In-house Referral: Clinical Social Work Discharge Planning Services: CM Consult Post Acute Care Choice: Durable Medical Equipment, Home Health Living arrangements for the past 2 months: Single Family Home                                      Prior Living Arrangements/Services Living arrangements for the past 2 months: Single Family Home Lives with:: Self Patient language and need for interpreter reviewed:: Yes Do you feel safe going back to the place where you live?: Yes      Need for Family Participation in Patient Care: Yes (Comment) Care giver support system in place?: Yes (comment) Current home services: DME Criminal Activity/Legal Involvement Pertinent to Current Situation/Hospitalization: No - Comment as needed  Activities of Daily Living Home Assistive Devices/Equipment:  Oxygen ADL Screening (condition at time of admission) Patient's cognitive ability adequate to safely complete daily activities?: Yes Is the patient deaf or have difficulty hearing?: No Does the patient have difficulty seeing, even when wearing glasses/contacts?: No Does the patient have difficulty concentrating, remembering, or making decisions?: No Patient able to express need for assistance with ADLs?: Yes Does the patient have difficulty dressing or bathing?: Yes Independently performs ADLs?: Yes (appropriate for developmental age) Does the patient have difficulty walking or climbing stairs?: Yes Weakness of Legs: Both Weakness of Arms/Hands: None  Permission Sought/Granted                  Emotional Assessment Appearance:: Appears stated age Attitude/Demeanor/Rapport: Engaged Affect (typically observed): Accepting   Alcohol / Substance Use: Not Applicable Psych Involvement: No (comment)  Admission diagnosis:  Acute respiratory failure, unspecified whether with hypoxia or hypercapnia (HCC) [J96.00] Acute on chronic hypoxic respiratory failure (HCC) [J96.21] Patient Active Problem List   Diagnosis Date Noted   Acute on chronic hypoxic respiratory failure (HCC) 08/13/2023   AKI (acute kidney injury) (HCC) 07/30/2023   COVID-19 07/30/2023   Sepsis (HCC) 07/30/2023   Acute respiratory failure with hypoxia (HCC) 07/29/2023   Palpitations 03/06/2020   Osteoporosis 10/04/2019   Hypercortisolemia (HCC) 10/04/2019   Closed transcervical fracture of left femur (HCC) 06/28/2019   Hyperlipidemia 06/28/2019   Anemia 06/28/2019  Hypertension    Calculus of gallbladder without cholecystitis without obstruction    Thrombocytopenia (HCC) 07/16/2015   History of appendicitis 07/16/2015   H/O ischemic heart disease 07/16/2015   H/O normocytic normochromic anemia 07/16/2015   Anxiety about health 07/16/2015   B12 deficiency 07/16/2015   CAD S/P percutaneous coronary angioplasty  01/02/2015   Subcutaneous emphysema (HCC) 06/25/2014   Subarachnoid hemorrhage (HCC) 06/25/2014   Pulmonary contusion 06/25/2014   IVH (intraventricular hemorrhage) (HCC) 06/25/2014   Trauma 06/25/2014   Intracranial hemorrhage (HCC) 06/25/2014   Brain injury (HCC) 06/25/2014   GERD 06/23/2010   WEIGHT LOSS, ABNORMAL 06/23/2010   NAUSEA, CHRONIC 06/23/2010   VERTEBRAL FRACTURE, THORACIC SPINE 07/16/2008   H N P-LUMBAR 06/25/2008   LOW BACK PAIN 06/25/2008   SCIATICA 06/25/2008   LUMBAR SPASM 06/25/2008   PCP:  Elfredia Nevins, MD Pharmacy:   [facility] - [place], [place] - [address] [place] [address] Phone: [phone] Fax: [phone]  [id number] - [place], [place] - [address] AT [facility] [address] [place] [address] Phone: [phone] Fax: [phone]     Social Determinants of Health (SDOH) Social History: SDOH Screenings   Food Insecurity: No Food Insecurity (08/13/2023)  Housing: High Risk (08/13/2023)  Transportation Needs: No Transportation Needs (08/13/2023)  Utilities: Patient Declined (08/13/2023)  Tobacco Use: Medium Risk (08/13/2023)   SDOH Interventions:     Readmission Risk Interventions    08/01/2023   12:59 PM  Readmission Risk Prevention Plan  Post Dischage Appt Complete  Medication Screening Complete  Transportation Screening Complete

## 2023-08-16 NOTE — TOC Progression Note (Signed)
Transition of Care (TOC) - Progression Note - DME - Wheelchair   Patient Details  Name: Travis Woodward. MRN: [id number] Date of Birth: 1941/10/16  Transition of Care [facility]) CM/SW Contact  Leitha Bleak, RN Phone Number: 08/16/2023, 1:16 PM   Patient suffers from acute on chronic hypoxic respiratory failure due to COVID 19 which impairs their ability to perform daily activities like ambulating in the home. A walker will not resolve issue with performing activities of daily living. A wheelchair  will allow patient to safely perform daily activities. Patient can safely propel the wheelchair in the  home or has a caregiver who can provide assistance

## 2023-08-16 NOTE — Evaluation (Signed)
Physical Therapy Evaluation Patient Details Name: Travis Woodward. MRN: [id number] DOB: 05/10/1941 Today's Date: 08/16/2023  History of Present Illness  Travis Woodward  is a 82 y.o. male, with medical history significant of CKD, CAD, hyperlipidemia, hypertension, hospitalization secondary to COVID infection, treated with antibiotics, steroids, patient was discharged on prednisone.  -Patient presents to ED secondary to shortness of breath, patient reports he continued to have significant dyspnea upon discharge, mainly with activity, this has much worsened recently, as he was significantly dyspneic with minimal activity has not been doing much movement, he denies fever, chills, chest pain, he reports cough, but nonproductive, no nausea or vomiting   -In ED patient was noted to be in the lower 80s on his 4 L nasal cannula, and he is currently on 8 L nasal cannula at rest, chest x-ray with bilateral opacities, but BNP within normal limit 71, procalcitonin low at 0.24, he is with leukocytosis at 14.4, 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    Clinical Impression  On therapist arrival, patient lying in bed; agreeable to therapist assessment.  On 8L of O2.   Patient states he is not having any pain currently; only has pain with taking "really deep breath".  Patient able to perform supine to sit with Supervision; min A for moving bed coverings.  Takes extra time to perform and patient reports fatigue with moving from supine to sitting.  He O2 saturation was initially 88% but after sitting a couple minutes; he starts to flex at the trunk due to weakness and fatigue and his O2 saturation drops to 74% so PT assists him with moving back to supine; min assist for legs only. Patient is then able to reposition himself in bed and scoot up with Supervision only.  patient left in bed with call button in reach; bed alarm set and nursing notified of mobility status.  Patient will  benefit from continued skilled therapy services during the remainder of his hospital stay and at the next recommended venue of care to address deficits and promote return to optimal function.           If plan is discharge home, recommend the following: A little help with walking and/or transfers;A little help with bathing/dressing/bathroom;Help with stairs or ramp for entrance;Assistance with cooking/housework   Can travel by private vehicle        Equipment Recommendations None recommended by PT  Recommendations for Other Services       Functional Status Assessment Patient has had a recent decline in their functional status and demonstrates the ability to make significant improvements in function in a reasonable and predictable amount of time.     Precautions / Restrictions Precautions Precautions: Fall Restrictions Weight Bearing Restrictions: No      Mobility  Bed Mobility Overal bed mobility: Needs Assistance Bed Mobility: Supine to Sit     Supine to sit: Supervision, HOB elevated     General bed mobility comments: slightly labored movement; patient sat on the edge of bed; reports fatigue and Shortness of breath; desaturation down to 74% so returned to supine Patient Response: Cooperative  Transfers                        Ambulation/Gait                  Careers information officer  Tilt Bed Tilt Bed Patient Response: Cooperative  Modified Rankin (Stroke Patients Only)       Balance Overall balance assessment: Needs assistance Sitting-balance support: Feet supported, No upper extremity supported Sitting balance-Leahy Scale: Good Sitting balance - Comments: fair/good seated at EOB; although as he fatigues starts to flex at his trunk       Standing balance comment: did not attempt standing balance                             Pertinent Vitals/Pain Pain Assessment Pain Assessment: No/denies  pain Breathing: occasional labored breathing, short period of hyperventilation Pain Location: only when taking a really deep breath does he have discomfort Pain Intervention(s): Limited activity within patient's tolerance    Home Living Family/patient expects to be discharged to:: Private residence Living Arrangements: Spouse/significant other Available Help at Discharge: Family;Available 24 hours/day Type of Home: House Home Access: Stairs to enter   [employer] of Steps: 2 + landing   Home Layout: One level Home Equipment: Agricultural consultant (2 wheels);Shower seat;BSC/3in1      Prior Function Prior Level of Function : Needs assist       Physical Assist : Mobility (physical);ADLs (physical) Mobility (physical): Bed mobility;Transfers;Gait;Stairs   Mobility Comments: household ambulator without AD, uses walking stick for outside ADLs Comments: Assisted by family     Extremity/Trunk Assessment   Upper Extremity Assessment Upper Extremity Assessment: Generalized weakness    Lower Extremity Assessment Lower Extremity Assessment: Generalized weakness    Cervical / Trunk Assessment Cervical / Trunk Assessment: Normal  Communication   Communication Communication: No apparent difficulties  Cognition Arousal: Alert Behavior During Therapy: WFL for tasks assessed/performed Overall Cognitive Status: Within Functional Limits for tasks assessed                                          General Comments      Exercises     Assessment/Plan    PT Assessment Patient needs continued PT services  PT Problem List Decreased strength;Decreased activity tolerance       PT Treatment Interventions DME instruction;Gait training;Stair training;Functional mobility training;Therapeutic activities;Therapeutic exercise;Patient/family education;Balance training    PT Goals (Current goals can be found in the Care Plan section)  Acute Rehab PT Goals Patient  Stated Goal: return home PT Goal Formulation: With patient/family Time For Goal Achievement: 08/30/23 Potential to Achieve Goals: Good    Frequency Min 2X/week     Co-evaluation               AM-PAC PT "6 Clicks" Mobility  Outcome Measure Help needed turning from your back to your side while in a flat bed without using bedrails?: None Help needed moving from lying on your back to sitting on the side of a flat bed without using bedrails?: None Help needed moving to and from a bed to a chair (including a wheelchair)?: A Little Help needed standing up from a chair using your arms (e.g., wheelchair or bedside chair)?: A Lot Help needed to walk in hospital room?: A Lot Help needed climbing 3-5 steps with a railing? : A Lot 6 Click Score: 17    End of Session Equipment Utilized During Treatment: Oxygen Activity Tolerance: Patient tolerated treatment well;Patient limited by fatigue Patient left: in bed;with call bell/phone within reach;with bed alarm set Nurse Communication: Mobility  status PT Visit Diagnosis: Muscle weakness (generalized) (M62.81)    Time: 1011-1030 PT Time Calculation (min) (ACUTE ONLY): 19 min   Charges:   PT Evaluation $PT Eval Low Complexity: 1 Low   PT General Charges $$ ACUTE PT VISIT: 1 Visit        12:04 PM, 08/16/23 Travis Woodward Small Envy Meno MPT [facility] [place] [phone] Ph:[phone]

## 2023-08-16 NOTE — Plan of Care (Signed)
  Problem: Acute Rehab PT Goals(only PT should resolve) Goal: Pt Will Go Supine/Side To Sit Outcome: Progressing Flowsheets (Taken 08/16/2023 1204) Pt will go Supine/Side to Sit: with modified independence Goal: Patient Will Transfer Sit To/From Stand Outcome: Progressing Flowsheets (Taken 08/16/2023 1204) Patient will transfer sit to/from stand: with minimal assist Goal: Pt Will Transfer Bed To Chair/Chair To Bed Outcome: Progressing Flowsheets (Taken 08/16/2023 1204) Pt will Transfer Bed to Chair/Chair to Bed: with min assist Goal: Pt Will Ambulate Outcome: Progressing Flowsheets (Taken 08/16/2023 1204) Pt will Ambulate:  25 feet  with minimal assist  with least restrictive assistive device

## 2023-08-16 NOTE — Care Management Important Message (Signed)
Important Message  Patient Details  Name: Travis Woodward. MRN: [id number] Date of Birth: 14-Sep-1941   Medicare Important Message Given:  Yes (spoke with Mr. Beeks at [phone] to explain letter, no additional copy needed)     [name] 08/16/2023, 3:32 PM

## 2023-08-17 DIAGNOSIS — U071 COVID-19: Secondary | ICD-10-CM | POA: Diagnosis not present

## 2023-08-17 DIAGNOSIS — J9601 Acute respiratory failure with hypoxia: Secondary | ICD-10-CM | POA: Diagnosis not present

## 2023-08-17 DIAGNOSIS — R0600 Dyspnea, unspecified: Secondary | ICD-10-CM | POA: Diagnosis not present

## 2023-08-17 DIAGNOSIS — G894 Chronic pain syndrome: Secondary | ICD-10-CM | POA: Diagnosis not present

## 2023-08-17 DIAGNOSIS — J8410 Pulmonary fibrosis, unspecified: Secondary | ICD-10-CM | POA: Diagnosis not present

## 2023-08-17 DIAGNOSIS — J9621 Acute and chronic respiratory failure with hypoxia: Secondary | ICD-10-CM | POA: Diagnosis not present

## 2023-08-17 DIAGNOSIS — K5903 Drug induced constipation: Secondary | ICD-10-CM | POA: Diagnosis not present

## 2023-08-17 DIAGNOSIS — J340 Abscess, furuncle and carbuncle of nose: Secondary | ICD-10-CM | POA: Diagnosis not present

## 2023-08-17 MED ORDER — FLUTICASONE PROPIONATE 50 MCG/ACT NA SUSP
1.0000 | Freq: Every day | NASAL | Status: DC
  Administered 2023-08-17 – 2023-08-26 (×10): 1 via NASAL
  Filled 2023-08-17: qty 16

## 2023-08-17 MED ORDER — CHLORHEXIDINE GLUCONATE CLOTH 2 % EX PADS
6.0000 | MEDICATED_PAD | Freq: Every day | CUTANEOUS | Status: DC
  Administered 2023-08-17 – 2023-08-20 (×4): 6 via TOPICAL

## 2023-08-17 NOTE — Progress Notes (Deleted)
Travis Woodward., male    DOB: 18-Oct-1941    MRN: [id number]   Brief patient profile:  24  yowm  *** referred to pulmonary clinic in [place]  08/18/2023 by *** for ***      History of Present Illness  08/18/2023  Pulmonary/ 1st office eval/ Sherene Sires / [place] [facility]  No chief complaint on file.    Dyspnea:  *** Cough: *** Sleep: *** SABA use: *** 02: *** Lung cancer screen: ***   Facility-Administered Medications Prior to Visit  Medication Dose Route Frequency Provider Last Rate Last Admin   acetaminophen (TYLENOL) tablet 650 mg  650 mg Oral Q6H PRN Elgergawy, Leana Roe, MD       Or   acetaminophen (TYLENOL) suppository 650 mg  650 mg Rectal Q6H PRN Elgergawy, Leana Roe, MD       albuterol (PROVENTIL) (2.5 MG/3ML) 0.083% nebulizer solution 2.5 mg  2.5 mg Nebulization Q2H PRN Elgergawy, Leana Roe, MD   2.5 mg at 08/13/23 2226   aspirin EC tablet 81 mg  81 mg Oral Daily Elgergawy, Leana Roe, MD   81 mg at 08/16/23 0919   cholecalciferol (VITAMIN D3) 25 MCG (1000 UNIT) tablet 5,000 Units  5,000 Units Oral Daily Elgergawy, Leana Roe, MD   5,000 Units at 08/16/23 0920   clopidogrel (PLAVIX) tablet 75 mg  75 mg Oral Daily Elgergawy, Leana Roe, MD   75 mg at 08/16/23 0920   dextromethorphan-guaiFENesin (MUCINEX DM) 30-600 MG per 12 hr tablet 1 tablet  1 tablet Oral BID Vassie Loll, MD   1 tablet at 08/16/23 1929   Dimethicone 6 % CREA 1 Application  1 Application Left Nare PRN Orson Aloe, RPH       docusate sodium (COLACE) capsule 100 mg  100 mg Oral BID Vassie Loll, MD   100 mg at 08/16/23 1930   enoxaparin (LOVENOX) injection 40 mg  40 mg Subcutaneous Q24H Elgergawy, Leana Roe, MD   40 mg at 08/16/23 1930   gabapentin (NEURONTIN) capsule 100 mg  100 mg Oral TID Elgergawy, Leana Roe, MD   100 mg at 08/16/23 1930   hydrALAZINE (APRESOLINE) injection 5 mg  5 mg Intravenous Q4H PRN Elgergawy, Leana Roe, MD       ipratropium-albuterol (DUONEB) 0.5-2.5 (3) MG/3ML nebulizer solution  3 mL  3 mL Nebulization TID Elgergawy, Leana Roe, MD   3 mL at 08/16/23 2000   methylPREDNISolone sodium succinate (SOLU-MEDROL) 125 mg/2 mL injection 80 mg  80 mg Intravenous Q12H Elgergawy, Leana Roe, MD   80 mg at 08/16/23 1924   ondansetron (ZOFRAN) tablet 4 mg  4 mg Oral Q6H PRN Elgergawy, Leana Roe, MD       Or   ondansetron (ZOFRAN) injection 4 mg  4 mg Intravenous Q6H PRN Elgergawy, Leana Roe, MD       oxyCODONE (Oxy IR/ROXICODONE) immediate release tablet 30 mg  30 mg Oral Q6H PRN Elgergawy, Leana Roe, MD   30 mg at 08/16/23 1924   pantoprazole (PROTONIX) EC tablet 40 mg  40 mg Oral Daily Elgergawy, Leana Roe, MD   40 mg at 08/16/23 0920   polyethylene glycol (MIRALAX / GLYCOLAX) packet 17 g  17 g Oral Daily Vassie Loll, MD   17 g at 08/16/23 7425   senna-docusate (Senokot-S) tablet 1 tablet  1 tablet Oral QHS PRN Elgergawy, Leana Roe, MD   1 tablet at 08/16/23 1925   sodium chloride (OCEAN) 0.65 % nasal spray 2 spray  2 spray Each Nare Q4H Elgergawy, Leana Roe, MD   2 spray at 08/16/23 1930   Outpatient Medications Prior to Visit  Medication Sig Dispense Refill   aspirin EC 81 MG tablet Take 81 mg by mouth daily.     Cholecalciferol (VITAMIN D HIGH POTENCY) 25 MCG (1000 UT) capsule Take 5,000 Units by mouth at bedtime.     clopidogrel (PLAVIX) 75 MG tablet Take 75 mg by mouth daily. 1 Tablet Daily     Cyanocobalamin (VITAMIN B-12 IJ) Inject 1 mL as directed every 30 (thirty) days.      ipratropium-albuterol (DUONEB) 0.5-2.5 (3) MG/3ML SOLN Take 3 mLs by nebulization 3 (three) times daily. 480 mL 2   nitroGLYCERIN (NITROSTAT) 0.4 MG SL tablet Place 0.4 mg under the tongue every 5 (five) minutes as needed.  (Patient not taking: Reported on 08/13/2023)     oxycodone (ROXICODONE) 30 MG immediate release tablet Take 30 mg by mouth every 6 (six) hours.      Past Medical History:  Diagnosis Date   Chronic kidney disease    stage 3   Colon polyps    Complication of anesthesia    difficulty  urinating    Coronary artery disease    Enlarged prostate    Hyperlipidemia    Hypertension       Objective:     There were no vitals taken for this visit.         Assessment   No problem-specific Assessment & Plan notes found for this encounter.     Sandrea Hughs, MD 08/17/2023

## 2023-08-17 NOTE — Progress Notes (Signed)
Mobility Specialist Progress Note:    08/17/23 1130  Mobility  Activity Dangled on edge of bed  Level of Assistance Standby assist, set-up cues, supervision of patient - no hands on  Assistive Device None  Range of Motion/Exercises Active;All extremities  Activity Response Tolerated well  Mobility Referral Yes  $Mobility charge 1 Mobility  Mobility Specialist Start Time (ACUTE ONLY) 1130  Mobility Specialist Stop Time (ACUTE ONLY) 1150  Mobility Specialist Time Calculation (min) (ACUTE ONLY) 20 min   Pt received in bed (HOB elevated), agreeable to sitting EOB to monitor SpO2. SpO2 90% on 8L at baseline. Pt required no assistance to sit EOB. Tolerated well, began to report slight dizziness. Immediately returned pt to supine (HOB elevated), required no assistance. Pt able to pull self up in bed independently. Pt reported stuffiness in L nostril and SOB throughout, SpO2 77% on 8L. Pt educated on proper breathing techniques, responded well, recovered, SpO2 90% on 8L. Left pt in bed, all needs met, call bell in reach.   Travis Woodward Mobility Specialist Please contact via Special educational needs teacher or  Rehab office at [phone]

## 2023-08-17 NOTE — Progress Notes (Signed)
Progress Note   Patient: Arison Beecroft. YNW:[id number] DOB: [birth year] DOA: 08/13/2023     4 DOS: the patient was seen and examined on 08/17/2023   Brief hospital admission narrative: As per H&P written by Dr. Randol Kern on 08/13/2023 Yasuo Pafford  is a 82 y.o. male, with medical history significant of CKD, CAD, hyperlipidemia, hypertension, hospitalization secondary to COVID infection, treated with antibiotics, steroids, patient was discharged on prednisone. -Patient presents to ED secondary to shortness of breath, patient reports he continued to have significant dyspnea upon discharge, mainly with activity, this has much worsened recently, as he was significantly dyspneic with minimal activity has not been doing much movement, he denies fever, chills, chest pain, he reports cough, but nonproductive, no nausea or vomiting  -In ED patient was noted to be in the lower 80s on his 4 L nasal cannula, and he is currently on 8 L nasal cannula at rest, chest x-ray with bilateral opacities, but BNP within normal limit 71, procalcitonin low at 0.24, he is with leukocytosis at 14.4, 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    Assessment and Plan: 1-acute on chronic hypoxic respiratory failure -Chronically on 4 L nasal cannula supplementation -On presentation requiring up to 10-15 L; patient will need 6 L at least at time of discharge to facilitate oxygen supplementation as an outpatient. 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 -Continue as needed bronchodilator management -Wean off oxygen supplementation as tolerated -Continue treatment with Solu-Medrol 80 mg twice a day -Continue treatment with mucolytic's, flutter valve and incentive spirometer -Follow clinical response. -Slowly responding; down to 7 L currently.  2-deconditioning -Follow PT/OT recommendations -Patient weak, frail and deconditioned. -Trapeze bar for his bed requested in order to  facilitate repositioning and physical activity. -Patient reports feeling very deconditioning working with PT  3-chronic pain syndrome -Continue home analgesics -Will be judicious with narcotic management in the setting of worsening respiratory status  4-stage II pressure injury -Continue frequent repositioning -Trapeze bar has been ordered in order to facilitate repositioning while in bed. -Continue preventive measures.  5-GERD/GI prophylaxis -Continue PPI -Lifestyle changes discussed with patient.  6-constipation -In the setting of narcotic usage -Continue the use of MiraLAX, Colace -Reports having further bowel movements overnight. -Continue to maintain adequate hydration. -If needed will initiate treatment with Linzess.  Subjective:  No fever, no chest pain, no nausea or vomiting.  Still requiring 7 L nasal cannula supplementation.  Reporting nasal congestion.  Physical Exam: Vitals:   08/16/23 2051 08/17/23 0524 08/17/23 0728 08/17/23 1222  BP: (!) 96/50 (!) 107/52  (!) 91/51  Pulse: 79 67  68  Resp:    20  Temp: 98 F (36.7 C) 97.9 F (36.6 C)  98.4 F (36.9 C)  TempSrc: Oral Oral    SpO2: 92% 90% 90% 93%  Weight:      Height:       General exam: Alert, awake, oriented x 3; continues slowly improving.  Denies chest pain, no nausea, no vomiting. Respiratory system: Positive rhonchi bilaterally, still requiring 7 L high flow nasal cannula.  No using accessory muscle. Cardiovascular system: Rate controlled, no rubs, no gallops, no JVD. Gastrointestinal system: Abdomen is nondistended, soft and nontender. No organomegaly or masses felt. Normal bowel sounds heard. Central nervous system: No focal neurological deficits. Extremities: No cyanosis or clubbing. Skin: No petechiae. Psychiatry: Judgement and insight appear normal. Mood & affect appropriate.   Latest data Reviewed: CBC: WBC 13.3, hemoglobin 9.5 platelet count  1 14K Basic metabolic: Sodium 952, potassium  5.0, chloride 94, bicarb 32, glucose 138, BUN 41, creatinine 1.51 and GFR 46.  Family Communication: Wife at bedside.  Disposition: Status is: Inpatient Remains inpatient appropriate because: Continue treatment for pulmonary fibrosis and hypoxia.   Planned Discharge Destination: Home  Time spent: 50 minutes  Author: Vassie Loll, MD 08/17/2023 6:47 PM  For on call review [email or web address].

## 2023-08-18 ENCOUNTER — Institutional Professional Consult (permissible substitution): Payer: PRIVATE HEALTH INSURANCE | Admitting: Internal Medicine

## 2023-08-18 DIAGNOSIS — J9621 Acute and chronic respiratory failure with hypoxia: Secondary | ICD-10-CM | POA: Diagnosis not present

## 2023-08-18 DIAGNOSIS — U071 COVID-19: Secondary | ICD-10-CM | POA: Diagnosis not present

## 2023-08-18 DIAGNOSIS — J8410 Pulmonary fibrosis, unspecified: Secondary | ICD-10-CM | POA: Diagnosis not present

## 2023-08-18 DIAGNOSIS — K5903 Drug induced constipation: Secondary | ICD-10-CM | POA: Diagnosis not present

## 2023-08-18 NOTE — Evaluation (Signed)
Occupational Therapy Evaluation Patient Details Name: Sabatino Peabody. MRN: [id number] DOB: [birth year] Today's Date: 08/18/2023   History of Present Illness Frasier Springborn  is a 82 y.o. male, with medical history significant of CKD, CAD, hyperlipidemia, hypertension, hospitalization secondary to COVID infection, treated with antibiotics, steroids, patient was discharged on prednisone.  -Patient presents to ED secondary to shortness of breath, patient reports he continued to have significant dyspnea upon discharge, mainly with activity, this has much worsened recently, as he was significantly dyspneic with minimal activity has not been doing much movement, he denies fever, chills, chest pain, he reports cough, but nonproductive, no nausea or vomiting   -In ED patient was noted to be in the lower 80s on his 4 L nasal cannula, and he is currently on 8 L nasal cannula at rest, chest x-ray with bilateral opacities, but BNP within normal limit 71, procalcitonin low at 0.24, he is with leukocytosis at 14.4, 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   Clinical Impression   Pt agreeable to OT evaluation but limited by SOB today. Pt noted to desaturate to low 70s after sitting at EOB. Pt reported he was unable to stand due to SOB. Pt was mod I for bed mobility but severely limited by cardiopulmonary status today. Nursing was notified that pt's SpO2 was fluctuating between 80s and 70s. Pt was left in bed with nursing in the room. Pt will benefit from continued OT in the hospital and recommended venue below to increase strength, balance, and endurance for safe ADL's.           If plan is discharge home, recommend the following: A little help with walking and/or transfers;A little help with bathing/dressing/bathroom;Assistance with cooking/housework;Help with stairs or ramp for entrance;Assist for transportation    Functional Status Assessment  Patient has had a  recent decline in their functional status and demonstrates the ability to make significant improvements in function in a reasonable and predictable amount of time.  Equipment Recommendations  None recommended by OT    Recommendations for Other Services       Precautions / Restrictions Precautions Precautions: Fall Restrictions Weight Bearing Restrictions: No      Mobility Bed Mobility Overal bed mobility: Modified Independent Bed Mobility: Supine to Sit     Supine to sit: HOB elevated, Modified independent (Device/Increase time)          Transfers                   General transfer comment: Not attempted due to O2 desatruation and complaints of SOB.      Balance Overall balance assessment: Needs assistance Sitting-balance support: Feet supported, No upper extremity supported Sitting balance-Leahy Scale: Good Sitting balance - Comments: fair to good seated at EOB                                   ADL either performed or assessed with clinical judgement   ADL Overall ADL's : Needs assistance/impaired     Grooming: Set up;Sitting   Upper Body Bathing: Set up;Sitting   Lower Body Bathing: Set up;Contact guard assist;Sitting/lateral leans   Upper Body Dressing : Set up;Sitting   Lower Body Dressing: Set up;Contact guard assist;Sitting/lateral leans     Toilet Transfer Details (indicate cue type and reason): Unable to attempt due to SOB today.  Vision Baseline Vision/History: 1 Wears glasses Vision Assessment?: No apparent visual deficits     Perception Perception: Not tested       Praxis  Not tested       Pertinent Vitals/Pain Pain Assessment Pain Assessment: Faces Faces Pain Scale: Hurts a little bit Pain Location: L leg and L hip Pain Descriptors / Indicators: Discomfort Pain Intervention(s): Limited activity within patient's tolerance, Monitored during session, Repositioned     Extremity/Trunk  Assessment Upper Extremity Assessment Upper Extremity Assessment: Generalized weakness (Mild L UE A/ROM deficits for shoulder flexion.)   Lower Extremity Assessment Lower Extremity Assessment: Defer to PT evaluation       Communication Communication Communication: No apparent difficulties   Cognition Arousal: Alert Behavior During Therapy: WFL for tasks assessed/performed Overall Cognitive Status: Within Functional Limits for tasks assessed                                                        Home Living Family/patient expects to be discharged to:: Private residence Living Arrangements: Spouse/significant other Available Help at Discharge: Family;Available 24 hours/day Type of Home: House Home Access: Stairs to enter [employer] of Steps: 2 + landing Entrance Stairs-Rails: Right;Left Home Layout: One level     Bathroom Shower/Tub: Producer, television/film/video: Handicapped height Bathroom Accessibility: Yes   Home Equipment: Agricultural consultant (2 wheels);Shower seat;BSC/3in1          Prior Functioning/Environment Prior Level of Function : Needs assist             Mobility Comments: household ambulator without AD, uses walking stick for outside ADLs Comments: Independent priot to the first of the month, per pt report.        OT Problem List: Cardiopulmonary status limiting activity;Decreased strength;Decreased range of motion;Decreased activity tolerance;Impaired balance (sitting and/or standing)      OT Treatment/Interventions: Self-care/ADL training;Therapeutic exercise;Therapeutic activities;Patient/family education;Balance training    OT Goals(Current goals can be found in the care plan section) Acute Rehab OT Goals Patient Stated Goal: Return home OT Goal Formulation: With patient Time For Goal Achievement: 09/01/23 Potential to Achieve Goals: Good  OT Frequency: Min 2X/week                                    End of Session Equipment Utilized During Treatment: Oxygen Nurse Communication: Other (comment) (Notified pt was struggling to keep O2 saturation above 85%.)  Activity Tolerance: Other (comment) (Limited by SOB) Patient left: in bed;with call bell/phone within reach  OT Visit Diagnosis: Unsteadiness on feet (R26.81);Other abnormalities of gait and mobility (R26.89);Muscle weakness (generalized) (M62.81)                Time: 4010-2725 OT Time Calculation (min): 12 min Charges:  OT General Charges $OT Visit: 1 Visit OT Evaluation $OT Eval Low Complexity: 1 Low  Kandice Schmelter OT, MOT  Danie Chandler 08/18/2023, 10:01 AM

## 2023-08-18 NOTE — Progress Notes (Signed)
PT Cancellation Note  Patient Details Name: Travis Woodward. MRN: [id number] DOB: January 16, 1941   Cancelled Treatment:    Reason Eval/Treat Not Completed: Pain limiting ability to participate  Held therapy due to difficulty with O2 saturation.  Travis Woodward, LPTA/CLT; CBIS [phone]  Travis Woodward 08/18/2023, 11:16 AM

## 2023-08-18 NOTE — Plan of Care (Signed)
  Problem: Acute Rehab OT Goals (only OT should resolve) Goal: Pt. Will Perform Grooming Flowsheets (Taken 08/18/2023 1004) Pt Will Perform Grooming:  with modified independence  standing Goal: Pt. Will Perform Lower Body Dressing Flowsheets (Taken 08/18/2023 1004) Pt Will Perform Lower Body Dressing:  with modified independence  sitting/lateral leans Goal: Pt. Will Transfer To Toilet Flowsheets (Taken 08/18/2023 1004) Pt Will Transfer to Toilet:  with modified independence  ambulating Goal: Pt/Caregiver Will Perform Home Exercise Program Flowsheets (Taken 08/18/2023 1004) Pt/caregiver will Perform Home Exercise Program:  Increased strength  Increased ROM  Both right and left upper extremity  Independently  Rai Severns OT, MOT

## 2023-08-18 NOTE — Plan of Care (Signed)

## 2023-08-18 NOTE — Progress Notes (Signed)
Progress Note   Patient: Travis Woodward. [id number] DOB: March 19, 1941 DOA: 08/13/2023     5 DOS: the patient was seen and examined on 08/18/2023   Brief hospital admission narrative: As per H&P written by Dr. Randol Kern on 08/13/2023 Travis Woodward  is a 82 y.o. male, with medical history significant of CKD, CAD, hyperlipidemia, hypertension, hospitalization secondary to COVID infection, treated with antibiotics, steroids, patient was discharged on prednisone. -Patient presents to ED secondary to shortness of breath, patient reports he continued to have significant dyspnea upon discharge, mainly with activity, this has much worsened recently, as he was significantly dyspneic with minimal activity has not been doing much movement, he denies fever, chills, chest pain, he reports cough, but nonproductive, no nausea or vomiting  -In ED patient was noted to be in the lower 80s on his 4 L nasal cannula, and he is currently on 8 L nasal cannula at rest, chest x-ray with bilateral opacities, but BNP within normal limit 71, procalcitonin low at 0.24, he is with leukocytosis at 14.4, 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    Assessment and Plan: 1-acute on chronic hypoxic respiratory failure -Chronically on 4 L nasal cannula supplementation -On presentation requiring up to 10-15 L; patient will need 6 L at least at time of discharge to facilitate oxygen supplementation as an outpatient. 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 -Continue as needed bronchodilator management -Wean off oxygen supplementation as tolerated -Continue treatment with Solu-Medrol 80 mg twice a day -Continue treatment with mucolytic's, flutter valve and incentive spirometer -Follow clinical response. -Slowly responding; down to 6-7 L at rest; still experiencing significant desaturation, short winded sensation with exertion and up to 10 L high flow nasal cannula supplementation  required to stabilize oxygen levels.    2-deconditioning -Follow PT/OT recommendations -Patient weak, frail and deconditioned. -Trapeze bar for his bed requested in order to facilitate repositioning and physical activity. -Patient reports feeling very deconditioning working with PT  3-chronic pain syndrome -Continue home analgesics therapy. -Will be judicious with narcotic management in the setting of worsening respiratory status.  4-stage II pressure injury -Continue frequent repositioning -Trapeze bar has been ordered in order to facilitate repositioning while in bed. -Continue preventive measures. -No signs of superimposed infection appreciated.  5-GERD/GI prophylaxis -Continue PPI -Lifestyle changes discussed with patient.  6-constipation -In the setting of narcotic usage -Continue the use of MiraLAX, Colace -Reports having further bowel movements overnight. -Continue to maintain adequate hydration. -If needed will initiate treatment with Linzess.  7-rhinitis -Continue saline spray, Flonase and Zyrtec -High flow nasal cannula supplementation probably contributing to patient's nostril clogged feelings.  Subjective:  No fever, no chest pain, no nausea or vomiting.  Still requiring high level of oxygen supplementation especially on exertion up to 10 L; just a day evening patient was able to de-escalate around 6 L while at rest with good saturation.  Physical Exam: Vitals:   08/18/23 0824 08/18/23 0942 08/18/23 1326 08/18/23 1501  BP: 100/62   (!) 90/58  Pulse: 75   81  Resp:    16  Temp: 98 F (36.7 C)   (!) 97.4 F (36.3 C)  TempSrc: Oral   Oral  SpO2: 95% 94% 93% 96%  Weight:      Height:       General exam: Alert, awake, oriented x 3; reports feeling overall better.  But is still very short winded and experiencing desaturation with exertion.  After today's obtained with physical  therapy he ended requiring up to 10 L of high flow nasal cannula supplementation to  stabilize oxygen levels. Respiratory system: Positive rhonchi bilaterally; no using accessory muscles.  No wheezing on exam. Cardiovascular system:RRR. No rubs or gallops; no JVD. Gastrointestinal system: Abdomen is nondistended, soft and nontender. No organomegaly or masses felt. Normal bowel sounds heard. Central nervous system: Weak and deconditioned; no focal neurological deficits. Extremities: No cyanosis or clubbing. Skin: No petechiae.  Stage II decubitus pressure injury present at time of admission without signs of superimposed infection. Psychiatry: Judgement and insight appear normal. Mood & affect appropriate.   Latest data Reviewed: CBC: WBC 13.3, hemoglobin 9.5 platelet count 1 14K Basic metabolic: Sodium 161, potassium 5.0, chloride 94, bicarb 32, glucose 138, BUN 41, creatinine 1.51 and GFR 46.  Family Communication: Wife at bedside.  Disposition: Status is: Inpatient Remains inpatient appropriate because: Continue treatment for pulmonary fibrosis and hypoxia.   Planned Discharge Destination: Home  Time spent: 50 minutes  Author: Vassie Loll, MD 08/18/2023 6:27 PM  For on call review [email or web address].

## 2023-08-19 DIAGNOSIS — K5903 Drug induced constipation: Secondary | ICD-10-CM | POA: Diagnosis not present

## 2023-08-19 DIAGNOSIS — J9621 Acute and chronic respiratory failure with hypoxia: Secondary | ICD-10-CM | POA: Diagnosis not present

## 2023-08-19 DIAGNOSIS — J8410 Pulmonary fibrosis, unspecified: Secondary | ICD-10-CM | POA: Diagnosis not present

## 2023-08-19 DIAGNOSIS — U071 COVID-19: Secondary | ICD-10-CM | POA: Diagnosis not present

## 2023-08-19 LAB — BASIC METABOLIC PANEL
Anion gap: 4 — ABNORMAL LOW (ref 5–15)
BUN: 39 mg/dL — ABNORMAL HIGH (ref 8–23)
CO2: 33 mmol/L — ABNORMAL HIGH (ref 22–32)
Calcium: 9 mg/dL (ref 8.9–10.3)
Chloride: 96 mmol/L — ABNORMAL LOW (ref 98–111)
Creatinine, Ser: 1.31 mg/dL — ABNORMAL HIGH (ref 0.61–1.24)
GFR, Estimated: 54 mL/min — ABNORMAL LOW (ref >60)
Glucose, Bld: 121 mg/dL — ABNORMAL HIGH (ref 70–99)
Potassium: 4.4 mmol/L (ref 3.5–5.1)
Sodium: 133 mmol/L — ABNORMAL LOW (ref 135–145)

## 2023-08-19 LAB — CBC
HCT: 29.6 % — ABNORMAL LOW (ref 39.0–52.0)
Hemoglobin: 9.5 g/dL — ABNORMAL LOW (ref 13.0–17.0)
MCH: 30.5 pg (ref 26.0–34.0)
MCHC: 32.1 g/dL (ref 30.0–36.0)
MCV: 95.2 fL (ref 80.0–100.0)
Platelets: 98 10*3/uL — ABNORMAL LOW (ref 150–400)
RBC: 3.11 MIL/uL — ABNORMAL LOW (ref 4.22–5.81)
RDW: 16 % — ABNORMAL HIGH (ref 11.5–15.5)
WBC: 10.3 10*3/uL (ref 4.0–10.5)
nRBC: 0 % (ref 0.0–0.2)

## 2023-08-19 LAB — GLUCOSE, CAPILLARY: Glucose-Capillary: 137 mg/dL — ABNORMAL HIGH (ref 70–99)

## 2023-08-19 NOTE — Progress Notes (Signed)
Progress Note   Patient: Travis Woodward. ZOX:[id number] DOB: September 24, 1941 DOA: 08/13/2023     6 DOS: the patient was seen and examined on 08/19/2023   Brief hospital admission narrative: As per H&P written by Dr. Randol Kern on 08/13/2023 Travis Woodward  is a 82 y.o. male, with medical history significant of CKD, CAD, hyperlipidemia, hypertension, hospitalization secondary to COVID infection, treated with antibiotics, steroids, patient was discharged on prednisone. -Patient presents to ED secondary to shortness of breath, patient reports he continued to have significant dyspnea upon discharge, mainly with activity, this has much worsened recently, as he was significantly dyspneic with minimal activity has not been doing much movement, he denies fever, chills, chest pain, he reports cough, but nonproductive, no nausea or vomiting  -In ED patient was noted to be in the lower 80s on his 4 L nasal cannula, and he is currently on 8 L nasal cannula at rest, chest x-ray with bilateral opacities, but BNP within normal limit 71, procalcitonin low at 0.24, he is with leukocytosis at 14.4, 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    Assessment and Plan: 1-acute on chronic hypoxic respiratory failure -Chronically on 4 L nasal cannula supplementation -On presentation requiring up to 10-15 L; patient will need 6 L at least at time of discharge to facilitate oxygen supplementation as an outpatient. 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 -Continue as needed bronchodilator management -Wean off oxygen supplementation as tolerated -Continue treatment with Solu-Medrol 80 mg twice a day -Continue treatment with mucolytic's, flutter valve and incentive spirometer -Follow clinical response. -Slowly responding; down to 7 L at rest; still experiencing significant desaturation and short winded sensation with minimal activity. -Patient was not able to participate with PT  today.  2-deconditioning -Follow PT/OT recommendations -Patient weak, frail and deconditioned. -Trapeze bar for his bed requested in order to facilitate repositioning and physical activity. -Patient reports feeling very deconditioning and experiencing easy fatigability.  3-chronic pain syndrome -Continue home analgesics therapy. -Will continue to be judicious with narcotic management in the setting of worsening respiratory status.  4-stage II pressure injury -Continue frequent repositioning -Trapeze bar has been ordered in order to facilitate repositioning while in bed. -Continue preventive measures. -No signs of superimposed infection appreciated.  5-GERD/GI prophylaxis -Continue PPI -Lifestyle changes discussed with patient.  6-constipation -In the setting of narcotic usage -Continue the use of MiraLAX, Colace -Reports having further bowel movements overnight. -Continue to maintain adequate hydration. -If needed will initiate treatment with Linzess.  7-rhinitis -Continue saline spray, Flonase and Zyrtec -High flow nasal cannula supplementation probably contributing to patient's nostril clogged feelings.  Subjective:  No fever, no chest pain, no nausea vomiting.  Using 7 L high flow nasal cannula supplementation today and expressing improvement in his nasal congestion symptoms.  Still short winded and desaturated with activity.  Physical Exam: Vitals:   08/19/23 0419 08/19/23 0659 08/19/23 1327 08/19/23 1335  BP: 110/60  94/60   Pulse: 79  87   Resp: 17  18   Temp: 98.2 F (36.8 C)  98.2 F (36.8 C)   TempSrc: Oral  Oral   SpO2: 94% 98% 96% 97%  Weight:      Height:       General exam: Alert, awake, oriented x 3; no fever, no chest pain, no nausea or vomiting.  Reports feeling better today; still experiencing desaturation with activity but is down to 7 L high flow nasal cannula supplementation. Respiratory system: Positive rhonchi bilaterally;  no using accessory  muscles at rest.  Intermittent tachypnea reported by nursing staff. Cardiovascular system:RRR. No rubs or gallops. Gastrointestinal system: Abdomen is nondistended, soft and nontender. No organomegaly or masses felt. Normal bowel sounds heard. Central nervous system: Alert and oriented. No focal neurological deficits. Extremities: No cyanosis or clubbing. Skin: No petechiae.  Stage II decubitus ulcer present at the end of admission without signs of superimposed infection. Psychiatry: Judgement and insight appear normal. Mood & affect appropriate.   Latest data Reviewed: CBC: White blood cells 10.3, hemoglobin 9.5 and platelet count 98K Basic metabolic panel: Sodium 133, potassium 4.4, chloride 96, bicarb 33, BUN 39, creatinine 1.31 and GFR 54.  Family Communication: Wife at bedside.  Disposition: Status is: Inpatient Remains inpatient appropriate because: Continue treatment for pulmonary fibrosis and hypoxia.   Planned Discharge Destination: Home  Time spent: 50 minutes  Author: Vassie Loll, MD 08/19/2023 5:36 PM  For on call review [email or web address].

## 2023-08-19 NOTE — Progress Notes (Signed)
PT Cancellation Note  Patient Details Name: Travis Woodward. MRN: [id number] DOB: [birth year]   Cancelled Treatment:    Reason Eval/Treat Not Completed: Other (comment) (Pt requesting to hold therapy today due to high levels of fatigue and consistent desaturation with movement.)  Pt requesting to hold therapy today due to length of time to recover when desaturating. Pt was educated on benefits of moving to improve activity tolerance. Pt agreed and reports doing bed level activities as able when feeling well.   PT will continue to treat pt as schedule permits and pt pending.   Nelida Meuse 08/19/2023, 10:16 AM

## 2023-08-19 NOTE — Progress Notes (Signed)
Foley removed at 0900 per Dr. Gwenlyn Perking order.  600 mls clear yellow urine emptied.  Given urinal for voiding.

## 2023-08-19 NOTE — Care Management Important Message (Signed)
Important Message  Patient Details  Name: Travis Woodward. MRN: [id number] Date of Birth: [birth year]   Medicare Important Message Given:  Yes     [name] 08/19/2023, 4:34 PM

## 2023-08-19 NOTE — TOC Progression Note (Signed)
Transition of Care (TOC) - Progression Note    Patient Details  Name: Travis Woodward. MRN: [id number] Date of Birth: 28-May-1941  Transition of Care [facility]) CM/SW Contact  Annice Needy, LCSW Phone Number: 08/19/2023, 1:42 PM  Clinical Narrative:    Spoke with Mrs. Ludtke. Discussed HH services. She is agreeable. Patient was previously referred to [facility] and she would like to remain with [facility]. Referral made to [facility] and accepted. Mrs. Reckner requests a Trapeze bar. Trapeze bar ordered via adapt.    Expected Discharge Plan: Home w Home Health Services Barriers to Discharge: Continued Medical Work up  Expected Discharge Plan and Services In-house Referral: Clinical Social Work Discharge Planning Services: CM Consult Post Acute Care Choice: Durable Medical Equipment, Home Health Living arrangements for the past 2 months: Single Family Home                 DME Arranged: Trapeze DME Agency: [employer] Date DME Agency Contacted: 08/19/23 Time DME Agency Contacted: [phone] Representative spoke with at DME Agency: Ian Malkin HH Arranged: RN, PT HH Agency: [facility] Date [facility] Agency Contacted: 08/19/23 Time HH Agency Contacted: 1331 Representative spoke with at [facility] Agency: Kandee Keen   Social Determinants of Health (SDOH) Interventions SDOH Screenings   Food Insecurity: No Food Insecurity (08/13/2023)  Housing: High Risk (08/13/2023)  Transportation Needs: No Transportation Needs (08/13/2023)  Utilities: Patient Declined (08/13/2023)  Tobacco Use: Medium Risk (08/13/2023)    Readmission Risk Interventions    08/01/2023   12:59 PM  Readmission Risk Prevention Plan  Post Dischage Appt Complete  Medication Screening Complete  Transportation Screening Complete

## 2023-08-19 NOTE — Progress Notes (Signed)
Patient voided in urinal while in my care, tolerated well.

## 2023-08-20 DIAGNOSIS — E538 Deficiency of other specified B group vitamins: Secondary | ICD-10-CM

## 2023-08-20 DIAGNOSIS — E782 Mixed hyperlipidemia: Secondary | ICD-10-CM

## 2023-08-20 DIAGNOSIS — J9621 Acute and chronic respiratory failure with hypoxia: Secondary | ICD-10-CM | POA: Diagnosis not present

## 2023-08-20 DIAGNOSIS — I2510 Atherosclerotic heart disease of native coronary artery without angina pectoris: Secondary | ICD-10-CM | POA: Diagnosis not present

## 2023-08-20 DIAGNOSIS — U071 COVID-19: Secondary | ICD-10-CM | POA: Diagnosis not present

## 2023-08-20 NOTE — TOC Progression Note (Signed)
Transition of Care (TOC) - Progression Note    Patient Details  Name: Travis Woodward. MRN: [id number] Date of Birth: September 13, 1941  Transition of Care [facility]) CM/SW Contact  Annice Needy, LCSW Phone Number: 08/20/2023, 1:51 PM  Clinical Narrative:    LTACH was discussed in progression rounds. TOC requested that [facility] review patient's chart for [facility] appropriateness.    Expected Discharge Plan: Home w Home Health Services Barriers to Discharge: Continued Medical Work up  Expected Discharge Plan and Services In-house Referral: Clinical Social Work Discharge Planning Services: CM Consult Post Acute Care Choice: Durable Medical Equipment, Home Health Living arrangements for the past 2 months: Single Family Home                 DME Arranged: Trapeze DME Agency: [employer] Date DME Agency Contacted: 08/19/23 Time DME Agency Contacted: [phone] Representative spoke with at DME Agency: Ian Malkin HH Arranged: RN, PT HH Agency: [facility] Date [facility] Agency Contacted: 08/19/23 Time HH Agency Contacted: 1331 Representative spoke with at [facility] Agency: Kandee Keen   Social Determinants of Health (SDOH) Interventions SDOH Screenings   Food Insecurity: No Food Insecurity (08/13/2023)  Housing: High Risk (08/13/2023)  Transportation Needs: No Transportation Needs (08/13/2023)  Utilities: Patient Declined (08/13/2023)  Tobacco Use: Medium Risk (08/13/2023)    Readmission Risk Interventions    08/01/2023   12:59 PM  Readmission Risk Prevention Plan  Post Dischage Appt Complete  Medication Screening Complete  Transportation Screening Complete

## 2023-08-20 NOTE — Progress Notes (Signed)
0981 Patient taking medications with RN and eating breakfast, unavailable for nebulizer at this time.

## 2023-08-20 NOTE — Progress Notes (Signed)
Physical Therapy Treatment Patient Details Name: Travis Woodward. MRN: [id number] DOB: [birth year] Today's Date: 08/20/2023   History of Present Illness Travis Woodward  is a 82 y.o. male, with medical history significant of CKD, CAD, hyperlipidemia, hypertension, hospitalization secondary to COVID infection, treated with antibiotics, steroids, patient was discharged on prednisone.  -Patient presents to ED secondary to shortness of breath, patient reports he continued to have significant dyspnea upon discharge, mainly with activity, this has much worsened recently, as he was significantly dyspneic with minimal activity has not been doing much movement, he denies fever, chills, chest pain, he reports cough, but nonproductive, no nausea or vomiting   -In ED patient was noted to be in the lower 80s on his 4 L nasal cannula, and he is currently on 8 L nasal cannula at rest, chest x-ray with bilateral opacities, but BNP within normal limit 71, procalcitonin low at 0.24, he is with leukocytosis at 14.4, 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    PT Comments  Patient presents on 5 LPM O2 and agreeable for therapy.  Patient required [facility] raised for completing supine to sitting, demonstrates fair/good return for completing BLE ROM/strengthening exercises while seated at bedside, very unsteady on feet an limited to a few steps forward/backwards at bedside before having to sit due to weakness and c/o difficulty breathing.  Patient required 4-5 minutes for SpO2 to increase above 90% after sitting in chair - nursing staff notified.  Patient will benefit from continued skilled physical therapy in hospital and recommended venue below to increase strength, balance, endurance for safe ADLs and gait.      If plan is discharge home, recommend the following: A little help with bathing/dressing/bathroom;Help with stairs or ramp for entrance;Assistance with cooking/housework;A lot of  help with bathing/dressing/bathroom   Can travel by private vehicle        Equipment Recommendations  None recommended by PT    Recommendations for Other Services       Precautions / Restrictions Precautions Precautions: Fall Restrictions Weight Bearing Restrictions: No     Mobility  Bed Mobility Overal bed mobility: Needs Assistance Bed Mobility: Supine to Sit     Supine to sit: Modified independent (Device/Increase time), Supervision     General bed mobility comments: increased time, labored movement    Transfers Overall transfer level: Needs assistance Equipment used: Rolling walker (2 wheels) Transfers: Sit to/from Stand, Bed to chair/wheelchair/BSC Sit to Stand: Min assist, Mod assist   Step pivot transfers: Mod assist       General transfer comment: unsteady labored movement with diffiuclty advancing BLE due to weakness    Ambulation/Gait Ambulation/Gait assistance: Mod assist Gait Distance (Feet): 10 Feet Assistive device: Rolling walker (2 wheels) Gait Pattern/deviations: Decreased step length - right, Decreased step length - left, Decreased stride length, Knees buckling, Trunk flexed Gait velocity: slow     General Gait Details: limited to a few steps forward/backwards before having to sit due to buckling of knees and BLE weakness   Stairs             Wheelchair Mobility     Tilt Bed    Modified Rankin (Stroke Patients Only)       Balance Overall balance assessment: Needs assistance Sitting-balance support: Feet supported, No upper extremity supported Sitting balance-Leahy Scale: Fair Sitting balance - Comments: fair/good seated at EOB   Standing balance support: During functional activity, Reliant on assistive device for balance, Bilateral upper  extremity supported Standing balance-Leahy Scale: Poor Standing balance comment: using RW                            Cognition Arousal: Alert Behavior During Therapy:  WFL for tasks assessed/performed Overall Cognitive Status: Within Functional Limits for tasks assessed                                          Exercises General Exercises - Lower Extremity Long Arc Quad: Seated, AROM, Strengthening, Both, 10 reps Hip Flexion/Marching: Seated, AROM, Strengthening, Both, 10 reps Toe Raises: Seated, AROM, Strengthening, Both, 10 reps Heel Raises: Seated, AROM, Strengthening, Both, 10 reps    General Comments        Pertinent Vitals/Pain Pain Assessment Pain Assessment: No/denies pain    Home Living                          Prior Function            PT Goals (current goals can now be found in the care plan section) Acute Rehab PT Goals Patient Stated Goal: return home PT Goal Formulation: With patient/family Time For Goal Achievement: 08/30/23 Potential to Achieve Goals: Good Progress towards PT goals: Progressing toward goals    Frequency    Min 3X/week      PT Plan      Co-evaluation              AM-PAC PT "6 Clicks" Mobility   Outcome Measure  Help needed turning from your back to your side while in a flat bed without using bedrails?: None Help needed moving from lying on your back to sitting on the side of a flat bed without using bedrails?: A Little Help needed moving to and from a bed to a chair (including a wheelchair)?: A Lot Help needed standing up from a chair using your arms (e.g., wheelchair or bedside chair)?: A Little Help needed to walk in hospital room?: A Lot Help needed climbing 3-5 steps with a railing? : A Lot 6 Click Score: 16    End of Session Equipment Utilized During Treatment: Oxygen Activity Tolerance: Patient tolerated treatment well;Patient limited by fatigue Patient left: in chair;with call bell/phone within reach Nurse Communication: Mobility status PT Visit Diagnosis: Unsteadiness on feet (R26.81);Other abnormalities of gait and mobility (R26.89);Muscle  weakness (generalized) (M62.81)     Time: 1610-9604 PT Time Calculation (min) (ACUTE ONLY): 26 min  Charges:    $Therapeutic Exercise: 8-22 mins $Therapeutic Activity: 8-22 mins PT General Charges $$ ACUTE PT VISIT: 1 Visit                     1:47 PM, 08/20/23 Ocie Bob, MPT Physical Therapist with [facility] [phone] office [phone] mobile phone

## 2023-08-20 NOTE — Progress Notes (Signed)
Progress Note   Patient: Travis Woodward. ZDG:[id number] DOB: 11/07/1941 DOA: 08/13/2023     7 DOS: the patient was seen and examined on 08/20/2023   Brief hospital admission narrative: As per H&P written by Dr. Randol Kern on 08/13/2023 Armarion Zeltner  is a 82 y.o. male, with medical history significant of CKD, CAD, hyperlipidemia, hypertension, hospitalization secondary to COVID infection, treated with antibiotics, steroids, patient was discharged on prednisone. -Patient presents to ED secondary to shortness of breath, patient reports he continued to have significant dyspnea upon discharge, mainly with activity, this has much worsened recently, as he was significantly dyspneic with minimal activity has not been doing much movement, he denies fever, chills, chest pain, he reports cough, but nonproductive, no nausea or vomiting  -In ED patient was noted to be in the lower 80s on his 4 L nasal cannula, and he is currently on 8 L nasal cannula at rest, chest x-ray with bilateral opacities, but BNP within normal limit 71, procalcitonin low at 0.24, he is with leukocytosis at 14.4, 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    Assessment and Plan: 1-acute on chronic hypoxic respiratory failure -Chronically on 4 L nasal cannula supplementation -On presentation requiring up to 10-15 L; patient will need 6 L at least at time of discharge to facilitate oxygen supplementation as an outpatient. 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 -Continue as needed bronchodilator management -Wean off oxygen supplementation as tolerated -Continue treatment with Solu-Medrol 80 mg twice a day -Continue treatment with mucolytic's, flutter valve and incentive spirometer -Follow clinical response. -Slowly responding; down to 7 L at rest; still experiencing significant desaturation and short winded sensation with minimal activity. -Patient was not able to participate with PT  today.  2-deconditioning -Follow PT/OT recommendations -Patient weak, frail and deconditioned. -Trapeze bar for his bed requested in order to facilitate repositioning and physical activity. -Patient reports feeling very deconditioning and experiencing easy fatigability.  3-chronic pain syndrome -Continue home analgesics therapy. -Will continue to be judicious with narcotic management in the setting of worsening respiratory status.  4-stage II pressure injury -Continue frequent repositioning -Trapeze bar has been ordered in order to facilitate repositioning while in bed. -Continue preventive measures. -No signs of superimposed infection appreciated.  5-GERD/GI prophylaxis -Continue PPI -Lifestyle changes discussed with patient.  6-constipation -In the setting of narcotic usage -Continue the use of MiraLAX, Colace -Reports having further bowel movements overnight. -Continue to maintain adequate hydration. -If needed will initiate treatment with Linzess.  7-rhinitis -Continue saline spray, Flonase and Zyrtec -High flow nasal cannula supplementation probably contributing to patient's nostril clogged feelings.  Subjective:  Weak, deconditioned; no fever, no chest pain, no nausea, no vomiting.  Using 6 L high flow nasal cannula supplementation at rest.  Still short winded with activity and experiencing desaturation.  Physical Exam: Vitals:   08/19/23 1335 08/19/23 1946 08/19/23 2005 08/20/23 0400  BP:  112/64  (!) 94/56  Pulse:  82  73  Resp:  17  18  Temp:  98.4 F (36.9 C)  97.6 F (36.4 C)  TempSrc:  Oral    SpO2: 97%  98% 99%  Weight:      Height:       General exam: Alert, awake, oriented x 3; nontoxic liter high flow nasal cannula; no chest pain, no nausea, no vomiting.  Still short winded with activity and experiencing desaturation. Respiratory system: Positive rhonchi bilaterally; no using accessory muscle. Cardiovascular system: Rate controlled; no rubs,  no  gallops, no JVD. Gastrointestinal system: Abdomen is nondistended, soft and nontender. No organomegaly or masses felt. Normal bowel sounds heard. Central nervous system:No focal neurological deficits. Extremities: No cyanosis or clubbing. Skin: No petechiae.  Stage II decubitus pressure injury present at time of admission without signs of superimposed infection. Psychiatry: Judgement and insight appear normal. Mood & affect appropriate.   Latest data Reviewed: CBC: White blood cells 10.3, hemoglobin 9.5 and platelet count 98K Basic metabolic panel: Sodium 133, potassium 4.4, chloride 96, bicarb 33, BUN 39, creatinine 1.31 and GFR 54.  Family Communication: Wife at bedside.  Disposition: Status is: Inpatient Remains inpatient appropriate because: Continue treatment for pulmonary fibrosis and hypoxia.   Planned Discharge Destination: Home  Time spent: 50 minutes  Author: Vassie Loll, MD 08/20/2023 9:21 AM  For on call review [email or web address].

## 2023-08-21 DIAGNOSIS — J9621 Acute and chronic respiratory failure with hypoxia: Secondary | ICD-10-CM | POA: Diagnosis not present

## 2023-08-21 LAB — CBC
HCT: 30.2 % — ABNORMAL LOW (ref 39.0–52.0)
Hemoglobin: 9.5 g/dL — ABNORMAL LOW (ref 13.0–17.0)
MCH: 30 pg (ref 26.0–34.0)
MCHC: 31.5 g/dL (ref 30.0–36.0)
MCV: 95.3 fL (ref 80.0–100.0)
Platelets: 98 10*3/uL — ABNORMAL LOW (ref 150–400)
RBC: 3.17 MIL/uL — ABNORMAL LOW (ref 4.22–5.81)
RDW: 16 % — ABNORMAL HIGH (ref 11.5–15.5)
WBC: 8 10*3/uL (ref 4.0–10.5)
nRBC: 0 % (ref 0.0–0.2)

## 2023-08-21 LAB — BASIC METABOLIC PANEL
Anion gap: 5 (ref 5–15)
BUN: 31 mg/dL — ABNORMAL HIGH (ref 8–23)
CO2: 31 mmol/L (ref 22–32)
Calcium: 9.2 mg/dL (ref 8.9–10.3)
Chloride: 99 mmol/L (ref 98–111)
Creatinine, Ser: 1.16 mg/dL (ref 0.61–1.24)
GFR, Estimated: >60 mL/min (ref >60)
Glucose, Bld: 118 mg/dL — ABNORMAL HIGH (ref 70–99)
Potassium: 5.3 mmol/L — ABNORMAL HIGH (ref 3.5–5.1)
Sodium: 135 mmol/L (ref 135–145)

## 2023-08-21 MED ORDER — METHYLPREDNISOLONE SODIUM SUCC 125 MG IJ SOLR
80.0000 mg | INTRAMUSCULAR | Status: DC
  Administered 2023-08-22 – 2023-08-24 (×3): 80 mg via INTRAVENOUS
  Filled 2023-08-21 (×3): qty 2

## 2023-08-21 MED ORDER — BISACODYL 10 MG RE SUPP
10.0000 mg | Freq: Once | RECTAL | Status: AC
  Administered 2023-08-21: 10 mg via RECTAL
  Filled 2023-08-21: qty 1

## 2023-08-21 NOTE — Progress Notes (Signed)
PROGRESS NOTE   Travis Woodward.  YQM:[id number]    DOB: 09/16/1941    DOA: 08/13/2023  PCP: Elfredia Nevins, MD   I have briefly reviewed patients previous medical records in [facility].  Chief Complaint  Patient presents with   Shortness of Breath    Brief Narrative:  82 year old male with PMH of CKD, CAD, HLD, HTN, hospitalization 8/1 - 8/4 for acute respiratory failure with hypoxia due to COVID 19 infection and AKI, discharged on prednisone, presented again to the ED on 8/16 secondary to continued significant dyspnea, mainly exertional.  In the ED he was noted to be hypoxic in the low 80s on 4 L/min [place] oxygen.  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  Admitted for acute on chronic respiratory failure with hypoxia.   Assessment & Plan:  Principal Problem:   Acute on chronic hypoxic respiratory failure (HCC) Active Problems:   CAD S/P percutaneous coronary angioplasty   Hyperlipidemia   COVID-19   B12 deficiency   Acute on chronic respiratory failure with hypoxia Chronically on 4 L/min  oxygen On initial presentation required 10-15 L. 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 Has been on high-dose IV Solu-Medrol 80 mg twice daily since admission, will start to taper. Aggressive pulmonary toilet Ongoing dyspnea on minimal exertion but appears comfortable at rest. Overall poor prognosis, may need to consider palliative care consult on Monday to address goals of care. O2 requirement has come down to 5 L/min at rest but will have to see how he does with activity.  Will order ambulatory O2 eval for tomorrow  Hyperkalemia: Potassium 5.3 on 8/24 despite normal creatinine.  Unclear etiology. No hemolysis mention. Follow BMP in AM.  AKI Had creatinine of 1.51 on 8/19. Resolved  Normocytic anemia Stable  Thrombocytopenia Stable  Physical deconditioning: Therapies have evaluated and recommend home health PT and OT. Has a trapeze bar in bed to  facilitate repositioning and physical activity which she has been using.  Chronic pain syndrome Continue home regimen and avoid escalating opioids especially given his respiratory status  GERD: PPI  Constipation Continue bowel regimen  Adult failure to thrive Palliative care consultation  Body mass index is 20.04 kg/m.  Pressure Ulcer: Pressure Injury 08/13/23 Rectum Left Stage 2 -  Partial thickness loss of dermis presenting as a shallow open injury with a red, pink wound bed without slough. (Active)  08/13/23 2131  Location: Rectum  Location Orientation: Left  Staging: Stage 2 -  Partial thickness loss of dermis presenting as a shallow open injury with a red, pink wound bed without slough.  Wound Description (Comments):   Present on Admission: Yes  Dressing Type Foam - Lift dressing to assess site every shift 08/21/23 0838    ACP Documents: None present DVT prophylaxis: enoxaparin (LOVENOX) injection 40 mg Start: 08/13/23 2200     Code Status: Full Code:  Family Communication: None at bedside Disposition:  Status is: Inpatient Remains inpatient appropriate because: Ongoing significant hypoxia and high O2 requirement     Consultants:     Procedures:     Antimicrobials:      Subjective:  Patient states that if he is lying still in bed without much activity he feels fine but minimal activity causes dyspnea on exertion.  "It is just my lungs"  Objective:   Vitals:   08/21/23 0516 08/21/23 0838 08/21/23 1448 08/21/23 1502  BP: 104/63  (!) 104/53   Pulse: 78  86   Resp: 18  18   Temp: 98 F (36.7 C)  98.1 F (36.7 C)   TempSrc: Oral  Oral   SpO2: 97% 98% 96% 97%  Weight:      Height:        General exam: Elderly male, moderately built and nourished, very pleasant, lying comfortably propped up in bed and in extremely good spirits. Respiratory system: Clear to auscultation. Respiratory effort normal.  Few chronic appearing crackles in the  bases. Cardiovascular system: S1 & S2 heard, RRR. No JVD, murmurs, rubs, gallops or clicks. No pedal edema. Gastrointestinal system: Abdomen is nondistended, soft and nontender. No organomegaly or masses felt. Normal bowel sounds heard. Central nervous system: Alert and oriented. No focal neurological deficits. Extremities: Symmetric 5 x 5 power. Skin: No rashes, lesions or ulcers Psychiatry: Judgement and insight appear normal. Mood & affect appropriate.     Data Reviewed:   I have personally reviewed following labs and imaging studies   CBC: Recent Labs  Lab 08/16/23 0539 08/19/23 0437 08/21/23 0613  WBC 13.3* 10.3 8.0  HGB 9.5* 9.5* 9.5*  HCT 29.7* 29.6* 30.2*  MCV 93.1 95.2 95.3  PLT 114* 98* 98*    Basic Metabolic Panel: Recent Labs  Lab 08/16/23 0539 08/19/23 0437 08/21/23 0613  NA 134* 133* 135  K 5.0 4.4 5.3*  CL 94* 96* 99  CO2 32 33* 31  GLUCOSE 138* 121* 118*  BUN 41* 39* 31*  CREATININE 1.51* 1.31* 1.16  CALCIUM 9.7 9.0 9.2    Liver Function Tests: No results for input(s): "AST", "ALT", "ALKPHOS", "BILITOT", "PROT", "ALBUMIN" in the last 168 hours.  CBG: Recent Labs  Lab 08/19/23 1127  GLUCAP 137*    Microbiology Studies:  No results found for this or any previous visit (from the past 240 hour(s)).  Radiology Studies:  No results found.  Scheduled Meds:    aspirin EC  81 mg Oral Daily   cholecalciferol  5,000 Units Oral Daily   clopidogrel  75 mg Oral Daily   dextromethorphan-guaiFENesin  1 tablet Oral BID   docusate sodium  100 mg Oral BID   enoxaparin (LOVENOX) injection  40 mg Subcutaneous Q24H   fluticasone  1 spray Each Nare Daily   gabapentin  100 mg Oral TID   ipratropium-albuterol  3 mL Nebulization TID   methylPREDNISolone (SOLU-MEDROL) injection  80 mg Intravenous Q12H   pantoprazole  40 mg Oral Daily   polyethylene glycol  17 g Oral Daily   sodium chloride  2 spray Each Nare Q4H    Continuous Infusions:     LOS: 8  days     Marcellus Scott, MD,  FACP, FHM, SFHM, [facility], [facility]   [facility] Hospitalist & Physician Advisor [place]      To contact the attending provider between 7A-7P or the covering provider during after hours 7P-7A, please log into the web site [email or web address] and access using universal [place] password for that web site. If you do not have the password, please call the hospital operator.  08/21/2023, 5:14 PM

## 2023-08-22 DIAGNOSIS — J9621 Acute and chronic respiratory failure with hypoxia: Secondary | ICD-10-CM | POA: Diagnosis not present

## 2023-08-22 LAB — BASIC METABOLIC PANEL
Anion gap: 5 (ref 5–15)
BUN: 30 mg/dL — ABNORMAL HIGH (ref 8–23)
CO2: 29 mmol/L (ref 22–32)
Calcium: 8.6 mg/dL — ABNORMAL LOW (ref 8.9–10.3)
Chloride: 100 mmol/L (ref 98–111)
Creatinine, Ser: 1.15 mg/dL (ref 0.61–1.24)
GFR, Estimated: >60 mL/min (ref >60)
Glucose, Bld: 106 mg/dL — ABNORMAL HIGH (ref 70–99)
Potassium: 3.4 mmol/L — ABNORMAL LOW (ref 3.5–5.1)
Sodium: 134 mmol/L — ABNORMAL LOW (ref 135–145)

## 2023-08-22 MED ORDER — POTASSIUM CHLORIDE CRYS ER 20 MEQ PO TBCR
20.0000 meq | EXTENDED_RELEASE_TABLET | Freq: Once | ORAL | Status: AC
  Administered 2023-08-22: 20 meq via ORAL
  Filled 2023-08-22: qty 1

## 2023-08-22 MED ORDER — MILK AND MOLASSES ENEMA
1.0000 | Freq: Once | RECTAL | Status: AC
  Administered 2023-08-22: 240 mL via RECTAL

## 2023-08-22 NOTE — Progress Notes (Signed)
Milk and molasses enema given as ordered. Patient tolerated the enema. 1 large BM after enema at this time.

## 2023-08-22 NOTE — Plan of Care (Signed)
  Problem: Education: Goal: Knowledge of risk factors and measures for prevention of condition will improve Outcome: Progressing   Problem: Coping: Goal: Psychosocial and spiritual needs will be supported Outcome: Progressing   Problem: Respiratory: Goal: Will maintain a patent airway Outcome: Progressing   Problem: Education: Goal: Knowledge of General Education information will improve Description: Including pain rating scale, medication(s)/side effects and non-pharmacologic comfort measures Outcome: Progressing   Problem: Health Behavior/Discharge Planning: Goal: Ability to manage health-related needs will improve Outcome: Progressing   Problem: Clinical Measurements: Goal: Ability to maintain clinical measurements within normal limits will improve Outcome: Progressing Goal: Will remain free from infection Outcome: Progressing Goal: Diagnostic test results will improve Outcome: Progressing Goal: Cardiovascular complication will be avoided Outcome: Progressing   Problem: Nutrition: Goal: Adequate nutrition will be maintained Outcome: Progressing   Problem: Coping: Goal: Level of anxiety will decrease Outcome: Progressing   Problem: Elimination: Goal: Will not experience complications related to bowel motility Outcome: Progressing Goal: Will not experience complications related to urinary retention Outcome: Progressing   Problem: Pain Managment: Goal: General experience of comfort will improve Outcome: Progressing   Problem: Safety: Goal: Ability to remain free from injury will improve Outcome: Progressing   Problem: Skin Integrity: Goal: Risk for impaired skin integrity will decrease Outcome: Progressing

## 2023-08-22 NOTE — Progress Notes (Signed)
PROGRESS NOTE   Travis Woodward.  XBM:[id number]    DOB: Dec 22, 1941    DOA: 08/13/2023  PCP: Elfredia Nevins, MD   I have briefly reviewed patients previous medical records in [facility].  Chief Complaint  Patient presents with   Shortness of Breath    Brief Narrative:  82 year old male with PMH of CKD, CAD, HLD, HTN, hospitalization 8/1 - 8/4 for acute respiratory failure with hypoxia due to COVID 19 infection and AKI, discharged on prednisone, presented again to the ED on 8/16 secondary to continued significant dyspnea, mainly exertional.  In the ED he was noted to be hypoxic in the low 80s on 4 L/min [place] oxygen.  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  Admitted for acute on chronic respiratory failure with hypoxia.  Ongoing very significant hypoxia requiring high O2 even with minimal activity such as sitting up at side of bed.  Awaiting palliative care consultation for goals of care discussion.   Assessment & Plan:  Principal Problem:   Acute on chronic hypoxic respiratory failure (HCC) Active Problems:   CAD S/P percutaneous coronary angioplasty   Hyperlipidemia   COVID-19   B12 deficiency   Acute on chronic respiratory failure with hypoxia Chronically on 4 L/min [place] oxygen On initial presentation required 10-15 L. 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 Had been on high-dose IV Solu-Medrol 80 mg twice daily since admission, reduced to 80 mg daily and could continue rapidly tapering off.  Not sure if there is much benefit of continuing it any longer.  Will check inflammatory markers and morning labs. Aggressive pulmonary toilet Ongoing dyspnea and hypoxia on minimal exertion but appears comfortable at rest.  8/25: Just on sitting at edge of bed, oxygen saturations dropped to 85% on 6 L/min , despite titrating up to 10 L/min, oxygen saturations remained in the 83-85% until he laid down. Overall poor prognosis, palliative care consulted to address goals of  care.  Could consider pulmonology consultation tomorrow to see if they have any additional recommendations. Discussed in detail with patient and spouse at bedside and they verbalized understanding. ?  Home with hospice  Hyperkalemia: Potassium 5.3 on 8/24 despite normal creatinine.  Unclear etiology. BMP today shows potassium of 3.4.  Yesterday's hyperkalemia may have been a lab error.  Replaced with low-dose potassium and follow BMP in AM.  AKI Had creatinine of 1.51 on 8/19. Resolved  Normocytic anemia Stable  Thrombocytopenia Stable  Physical deconditioning: Therapies have evaluated and recommend home health PT and OT.  Biggest barrier currently is exertional dyspnea and hypoxia on minimal exertion. Has a trapeze bar in bed to facilitate repositioning and physical activity which she has been using.  Chronic pain syndrome Continue home regimen and avoid escalating opioids especially given his respiratory status  GERD: PPI  Constipation Continue bowel regimen.  Failed Dulcolax suppository. Milk and molasses enema ordered.  Adult failure to thrive Palliative care consultation ordered.  Body mass index is 20.04 kg/m.  Pressure Ulcer: Pressure Injury 08/13/23 Rectum Left Stage 2 -  Partial thickness loss of dermis presenting as a shallow open injury with a red, pink wound bed without slough. (Active)  08/13/23 2131  Location: Rectum  Location Orientation: Left  Staging: Stage 2 -  Partial thickness loss of dermis presenting as a shallow open injury with a red, pink wound bed without slough.  Wound Description (Comments):   Present on Admission: Yes  Dressing Type Foam - Lift dressing to assess site every shift  08/22/23 0102    ACP Documents: None present DVT prophylaxis: enoxaparin (LOVENOX) injection 40 mg Start: 08/13/23 2200     Code Status: Full Code:  Family Communication: House at bedside Disposition:  Status is: Inpatient Remains inpatient appropriate  because: Ongoing significant hypoxia and high O2 requirement     Consultants:     Procedures:     Antimicrobials:      Subjective:  No new complaints.  Ongoing hypoxia and dyspnea on minimal activity.  No BM despite Dulcolax suppository, willing to try enema.  Patient showed pictures on his smart phone where he refurbished an old car which he has been actively engaged for the last 40 years.  He says it is for his 56-year-old grandson.  He is actively into cars and used to ride a motorbike.  Objective:   Vitals:   08/22/23 0454 08/22/23 0810 08/22/23 0913 08/22/23 0917  BP: (!) 88/46 98/62  (!) 96/50  Pulse: 82 84  86  Resp: 18     Temp: 98.7 F (37.1 C)     TempSrc:      SpO2: 97% 97% 90%   Weight:      Height:        General exam: Elderly male, moderately built and nourished, very pleasant, lying comfortably propped up in bed and in extremely good spirits.  Spouse at bedside. Respiratory system: Clear to auscultation. Respiratory effort normal.  Few chronic appearing crackles in the bases.  No increased work of breathing at rest. Cardiovascular system: S1 & S2 heard, RRR. No JVD, murmurs, rubs, gallops or clicks. No pedal edema. Gastrointestinal system: Abdomen is nondistended, soft and nontender. No organomegaly or masses felt. Normal bowel sounds heard. Central nervous system: Alert and oriented. No focal neurological deficits. Extremities: Symmetric 5 x 5 power. Skin: No rashes, lesions or ulcers Psychiatry: Judgement and insight appear normal. Mood & affect pleasant, positive despite serious illness above.  States that he will take a day at a time.    Data Reviewed:   I have personally reviewed following labs and imaging studies   CBC: Recent Labs  Lab 08/16/23 0539 08/19/23 0437 08/21/23 0613  WBC 13.3* 10.3 8.0  HGB 9.5* 9.5* 9.5*  HCT 29.7* 29.6* 30.2*  MCV 93.1 95.2 95.3  PLT 114* 98* 98*    Basic Metabolic Panel: Recent Labs  Lab 08/16/23 0539  08/19/23 0437 08/21/23 0613 08/22/23 0444  NA 134* 133* 135 134*  K 5.0 4.4 5.3* 3.4*  CL 94* 96* 99 100  CO2 32 33* 31 29  GLUCOSE 138* 121* 118* 106*  BUN 41* 39* 31* 30*  CREATININE 1.51* 1.31* 1.16 1.15  CALCIUM 9.7 9.0 9.2 8.6*    Liver Function Tests: No results for input(s): "AST", "ALT", "ALKPHOS", "BILITOT", "PROT", "ALBUMIN" in the last 168 hours.  CBG: Recent Labs  Lab 08/19/23 1127  GLUCAP 137*    Microbiology Studies:  No results found for this or any previous visit (from the past 240 hour(s)).  Radiology Studies:  No results found.  Scheduled Meds:    aspirin EC  81 mg Oral Daily   cholecalciferol  5,000 Units Oral Daily   clopidogrel  75 mg Oral Daily   dextromethorphan-guaiFENesin  1 tablet Oral BID   docusate sodium  100 mg Oral BID   enoxaparin (LOVENOX) injection  40 mg Subcutaneous Q24H   fluticasone  1 spray Each Nare Daily   gabapentin  100 mg Oral TID   ipratropium-albuterol  3 mL  Nebulization TID   methylPREDNISolone (SOLU-MEDROL) injection  80 mg Intravenous Q24H   milk and molasses  1 enema Rectal Once   pantoprazole  40 mg Oral Daily   polyethylene glycol  17 g Oral Daily   sodium chloride  2 spray Each Nare Q4H    Continuous Infusions:     LOS: 9 days     Marcellus Scott, MD,  FACP, FHM, SFHM, [facility], [facility]  [facility] [place]     To contact the attending provider between 7A-7P or the covering provider during after hours 7P-7A, please log into the web site [email or web address] and access using universal [place] password for that web site. If you do not have the password, please call the hospital operator.  08/22/2023, 1:09 PM

## 2023-08-22 NOTE — Progress Notes (Signed)
Patient stated that when he moves around at home he uses 6L [place] of supplemental oxygen.   Patient oxygen sats are 94-95% at rest with 5L [place] oxygen.  Sat patient up on side of bed using 6L [place] oxygen and sats dropped to 85%.   Titrated patient oxygen up to 8L [place] sats stayed around 83-85%  Titrated patient oxygen up to 10L [place] and sats continued to stay around 83-85%.   Helped patient lay back down in bed. Oxygen sats back to 92-94% using 5L [place].   Spoke with Dr. Bennie Pierini in person. Notified Caprice Red, CRN via secure chat.

## 2023-08-22 NOTE — Progress Notes (Signed)
Patient given suppository per MD order. No results this shift.  Patient did state before suppository given that " the prep for the colonoscopy works great for me."  I stated we would try the suppository first before other methods/medications were used.  No events overnight.

## 2023-08-23 ENCOUNTER — Inpatient Hospital Stay (HOSPITAL_COMMUNITY): Payer: Medicare HMO

## 2023-08-23 ENCOUNTER — Encounter (HOSPITAL_COMMUNITY): Payer: Self-pay | Admitting: Internal Medicine

## 2023-08-23 ENCOUNTER — Telehealth: Payer: Self-pay | Admitting: Critical Care Medicine

## 2023-08-23 DIAGNOSIS — Z7189 Other specified counseling: Secondary | ICD-10-CM | POA: Diagnosis not present

## 2023-08-23 DIAGNOSIS — J8410 Pulmonary fibrosis, unspecified: Secondary | ICD-10-CM | POA: Diagnosis not present

## 2023-08-23 DIAGNOSIS — Z515 Encounter for palliative care: Secondary | ICD-10-CM

## 2023-08-23 DIAGNOSIS — U071 COVID-19: Secondary | ICD-10-CM | POA: Diagnosis not present

## 2023-08-23 DIAGNOSIS — J069 Acute upper respiratory infection, unspecified: Secondary | ICD-10-CM | POA: Diagnosis not present

## 2023-08-23 DIAGNOSIS — J9621 Acute and chronic respiratory failure with hypoxia: Secondary | ICD-10-CM | POA: Diagnosis not present

## 2023-08-23 LAB — BASIC METABOLIC PANEL
Anion gap: 6 (ref 5–15)
BUN: 27 mg/dL — ABNORMAL HIGH (ref 8–23)
CO2: 30 mmol/L (ref 22–32)
Calcium: 8.8 mg/dL — ABNORMAL LOW (ref 8.9–10.3)
Chloride: 102 mmol/L (ref 98–111)
Creatinine, Ser: 1.09 mg/dL (ref 0.61–1.24)
GFR, Estimated: >60 mL/min (ref >60)
Glucose, Bld: 95 mg/dL (ref 70–99)
Potassium: 4.9 mmol/L (ref 3.5–5.1)
Sodium: 138 mmol/L (ref 135–145)

## 2023-08-23 LAB — CBC
HCT: 30 % — ABNORMAL LOW (ref 39.0–52.0)
Hemoglobin: 9.4 g/dL — ABNORMAL LOW (ref 13.0–17.0)
MCH: 30 pg (ref 26.0–34.0)
MCHC: 31.3 g/dL (ref 30.0–36.0)
MCV: 95.8 fL (ref 80.0–100.0)
Platelets: 110 10*3/uL — ABNORMAL LOW (ref 150–400)
RBC: 3.13 MIL/uL — ABNORMAL LOW (ref 4.22–5.81)
RDW: 16.9 % — ABNORMAL HIGH (ref 11.5–15.5)
WBC: 8.4 10*3/uL (ref 4.0–10.5)
nRBC: 0 % (ref 0.0–0.2)

## 2023-08-23 LAB — C-REACTIVE PROTEIN: CRP: 0.6 mg/dL (ref <1.0)

## 2023-08-23 LAB — SEDIMENTATION RATE: Sed Rate: 7 mm/h (ref 0–16)

## 2023-08-23 LAB — LACTATE DEHYDROGENASE: LDH: 184 U/L (ref 98–192)

## 2023-08-23 LAB — FERRITIN: Ferritin: 370 ng/mL — ABNORMAL HIGH (ref 24–336)

## 2023-08-23 LAB — D-DIMER, QUANTITATIVE: D-Dimer, Quant: 7.05 ug{FEU}/mL — ABNORMAL HIGH (ref 0.00–0.50)

## 2023-08-23 MED ORDER — IPRATROPIUM-ALBUTEROL 0.5-2.5 (3) MG/3ML IN SOLN
3.0000 mL | Freq: Two times a day (BID) | RESPIRATORY_TRACT | Status: DC
  Administered 2023-08-23 – 2023-08-26 (×6): 3 mL via RESPIRATORY_TRACT
  Filled 2023-08-23 (×6): qty 3

## 2023-08-23 NOTE — Progress Notes (Signed)
PROGRESS NOTE   [name].  EXB:[id number]    DOB: 07/29/1941    DOA: 08/13/2023  PCP: Elfredia Nevins, MD   I have briefly reviewed patients previous medical records in [facility].  Chief Complaint  Patient presents with   Shortness of Breath    Brief Narrative:  82 year old male with PMH of CKD, CAD, HLD, HTN, hospitalization 8/1 - 8/4 for acute respiratory failure with hypoxia due to COVID 19 infection and AKI, discharged on prednisone, presented again to the ED on 8/16 secondary to continued significant dyspnea, mainly exertional.  In the ED he was noted to be hypoxic in the low 80s on 4 L/min [place] oxygen.  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  Admitted for acute on chronic respiratory failure with hypoxia.  Ongoing very significant hypoxia requiring high O2 even with minimal activity such as sitting up at side of bed.  Palliative care has consulted, patient wishes to be full code, full scope, focused on recovery and return home. Tele Pulmonology consulted.   Assessment & Plan:  Principal Problem:   Acute on chronic hypoxic respiratory failure (HCC) Active Problems:   CAD S/P percutaneous coronary angioplasty   Hyperlipidemia   COVID-19   B12 deficiency   Acute on chronic respiratory failure with hypoxia Chronically on 4 L/min [place] oxygen On initial presentation required 10-15 L. 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 Had been on high-dose IV Solu-Medrol 80 mg twice daily since admission, reduced to 80 mg daily and could continue rapidly tapering off.  Not sure if there is much benefit of continuing it any longer.  Unremarkable inflammatory markers and morning labs (ESR, CRP, LDH normal.  Ferritin very minimally up). Aggressive pulmonary toilet Ongoing dyspnea and hypoxia on minimal exertion but appears comfortable at rest.  8/25: Just on sitting at edge of bed, oxygen saturations dropped to 85% on 6 L/min [place], despite titrating up to 10 L/min, oxygen  saturations remained in the 83-85% until he laid down. Overall poor prognosis. Discussed in detail with patient and spouse at bedside 8/25 and they verbalized understanding. Palliative care has consulted, patient wishes to be full code, full scope, focused on recovery and return home. Tele Pulmonology consulted.?  Consideration for dose or 2 of IV Lasix although clinically does not appear volume overloaded. As per TOC, declined by LTAC  Hyperkalemia/hypokalemia: Potassium normal today.  AKI Had creatinine of 1.51 on 8/19. Resolved  Normocytic anemia Stable  Thrombocytopenia Stable  Physical deconditioning: Therapies have evaluated and recommend home health PT and OT.  Biggest barrier currently is exertional dyspnea and hypoxia on minimal exertion. Has a trapeze bar in bed to facilitate repositioning and physical activity which she has been using.  Chronic pain syndrome Continue home regimen and avoid escalating opioids especially given his respiratory status  GERD: PPI  Constipation Continue bowel regimen.  Failed Dulcolax suppository. Had a large satisfactory BM after milk and molasses enema on 8/25.  Acute urinary retention: Developed overnight.?  Related to enema Had in and out catheterization yielding 900 mL urine. Since then patient has voided some and hopefully will not have to do the catheterization again.  Adult failure to thrive Palliative care consultation input appreciated.  Body mass index is 20.04 kg/m.  Pressure Ulcer: Pressure Injury 08/13/23 Rectum Left Stage 2 -  Partial thickness loss of dermis presenting as a shallow open injury with a red, pink wound bed without slough. (Active)  08/13/23 2131  Location: Rectum  Location Orientation:  Left  Staging: Stage 2 -  Partial thickness loss of dermis presenting as a shallow open injury with a red, pink wound bed without slough.  Wound Description (Comments):   Present on Admission: Yes  Dressing Type Foam  - Lift dressing to assess site every shift 08/22/23 4098    ACP Documents: None present DVT prophylaxis: enoxaparin (LOVENOX) injection 40 mg Start: 08/13/23 2200     Code Status: Full Code:  Family Communication: None at bedside. Disposition:  Status is: Inpatient Remains inpatient appropriate because: Ongoing significant hypoxia and high O2 requirement     Consultants:     Procedures:     Antimicrobials:      Subjective:  Had BM after enema.  Reported urinary retention requiring in and out catheterization but has since voided.  States as he has done daily that apart from his "lungs" he is fine.  He repeatedly says that he feels "superduper"!  Objective:   Vitals:   08/22/23 1951 08/22/23 2106 08/23/23 0427 08/23/23 0712  BP:  (!) 107/56 103/61   Pulse:  82 77   Resp:  17 17   Temp:  98 F (36.7 C) 97.6 F (36.4 C)   TempSrc:  Oral Oral   SpO2: 100% 98% 94% 94%  Weight:      Height:        General exam: Elderly male, moderately built and nourished, very pleasant, lying comfortably propped up in bed. Respiratory system: Clear to auscultation. Respiratory effort normal.  Few chronic appearing crackles in the bases.  No increased work of breathing at rest.  Unchanged for the last several days. Cardiovascular system: S1 & S2 heard, RRR. No JVD, murmurs, rubs, gallops or clicks. No pedal edema. Gastrointestinal system: Abdomen is nondistended, soft and nontender. No organomegaly or masses felt. Normal bowel sounds heard. Central nervous system: Alert and oriented. No focal neurological deficits. Extremities: Symmetric 5 x 5 power. Skin: No rashes, lesions or ulcers Psychiatry: Judgement and insight appear normal. Mood & affect pleasant, positive despite serious illness above.  States that he will take a day at a time.    Data Reviewed:   I have personally reviewed following labs and imaging studies   CBC: Recent Labs  Lab 08/19/23 0437 08/21/23 0613  08/23/23 0508  WBC 10.3 8.0 8.4  HGB 9.5* 9.5* 9.4*  HCT 29.6* 30.2* 30.0*  MCV 95.2 95.3 95.8  PLT 98* 98* 110*    Basic Metabolic Panel: Recent Labs  Lab 08/19/23 0437 08/21/23 0613 08/22/23 0444 08/23/23 0508  NA 133* 135 134* 138  K 4.4 5.3* 3.4* 4.9  CL 96* 99 100 102  CO2 33* 31 29 30   GLUCOSE 121* 118* 106* 95  BUN 39* 31* 30* 27*  CREATININE 1.31* 1.16 1.15 1.09  CALCIUM 9.0 9.2 8.6* 8.8*    Liver Function Tests: No results for input(s): "AST", "ALT", "ALKPHOS", "BILITOT", "PROT", "ALBUMIN" in the last 168 hours.  CBG: Recent Labs  Lab 08/19/23 1127  GLUCAP 137*    Microbiology Studies:  No results found for this or any previous visit (from the past 240 hour(s)).  Radiology Studies:  No results found.  Scheduled Meds:    aspirin EC  81 mg Oral Daily   cholecalciferol  5,000 Units Oral Daily   clopidogrel  75 mg Oral Daily   dextromethorphan-guaiFENesin  1 tablet Oral BID   docusate sodium  100 mg Oral BID   enoxaparin (LOVENOX) injection  40 mg Subcutaneous Q24H  fluticasone  1 spray Each Nare Daily   gabapentin  100 mg Oral TID   ipratropium-albuterol  3 mL Nebulization BID   methylPREDNISolone (SOLU-MEDROL) injection  80 mg Intravenous Q24H   pantoprazole  40 mg Oral Daily   polyethylene glycol  17 g Oral Daily   sodium chloride  2 spray Each Nare Q4H    Continuous Infusions:     LOS: 10 days     Marcellus Scott, MD,  FACP, FHM, SFHM, [facility], [facility]  [facility] [place]     To contact the attending provider between 7A-7P or the covering provider during after hours 7P-7A, please log into the web site [email or web address] and access using universal [place] password for that web site. If you do not have the password, please call the hospital operator.  08/23/2023, 2:35 PM

## 2023-08-23 NOTE — Care Management Important Message (Signed)
Important Message  Patient Details  Name: Travis Woodward. MRN: [id number] Date of Birth: Mar 17, 1941   Medicare Important Message Given:  Yes     Travis Woodward 08/23/2023, 10:24 AM

## 2023-08-23 NOTE — Progress Notes (Signed)
Patient suddenly c/o difficulty starting stream, burning sensation, inability to urinate, only able to produce a few drops. Patient bladder scanned. 827 mL noted. MD Thomes Dinning made aware. Received order for in and out cath.

## 2023-08-23 NOTE — TOC Progression Note (Signed)
Transition of Care (TOC) - Progression Note    Patient Details  Name: Travis Woodward. MRN: [id number] Date of Birth: [birth year]  Transition of Care [facility]) CM/SW Contact  Donold Marotto A Yasmeen Manka, RN Phone Number: 08/23/2023, 11:20 AM  Clinical Narrative:    Sherron Monday with Alden Server at [facility], does not look like pt has qualifying needs that insurance will cover. TOC to follow for needs pending medical work-up.   Expected Discharge Plan: Home w Home Health Services Barriers to Discharge: Continued Medical Work up  Expected Discharge Plan and Services In-house Referral: Clinical Social Work Discharge Planning Services: CM Consult Post Acute Care Choice: Durable Medical Equipment, Home Health Living arrangements for the past 2 months: Single Family Home                 DME Arranged: [facility] D[facility] Agency: [employer] Date DME Agency Contacted: 08/19/23 Time DME Agency Contacted: [phone] Representative spoke with at DME Agency: Ian Malkin HH Arranged: RN, PT HH Agency: [facility] Date [facility] Agency Contacted: 08/19/23 Time HH Agency Contacted: [phone] Representative spoke with at [facility] Agency: Kandee Keen   Social Determinants of Health (SDOH) Interventions SDOH Screenings   Food Insecurity: No Food Insecurity (08/13/2023)  Housing: High Risk (08/13/2023)  Transportation Needs: No Transportation Needs (08/13/2023)  Utilities: Patient Declined (08/13/2023)  Tobacco Use: Medium Risk (08/23/2023)    Readmission Risk Interventions    08/01/2023   12:59 PM  Readmission Risk Prevention Plan  Post Dischage Appt Complete  Medication Screening Complete  Transportation Screening Complete
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
## 2023-08-23 NOTE — Consult Note (Signed)
Consultation Note Date: 08/23/2023   Patient Name: Travis Woodward.  DOB: [birth year]  MRN: [id number]  Age / Sex: 82 y.o., male  PCP: Elfredia Nevins, MD Referring Physician: Elease Etienne, MD  Reason for Consultation: Establishing goals of care  HPI/Patient Profile: 82 y.o. male  with past medical history of chronic respiratory failure on 4 L, CKD, CAD with stent placement in 2005, left hip arthroplasty 2020, HTN/HLD, hospitalization secondary to COVID beginning of August, enlarged prostate, back surgery x 6, cholecystectomy, admitted on 08/13/2023 with acute on chronic hypoxic respiratory failure.   Clinical Assessment and Goals of Care: I have reviewed medical records including EPIC notes, labs and imaging, received report from RN, assessed the patient.  [name] is sitting up in bed.  He greets me, making and somewhat keeping eye contact.  He is alert and oriented x 3, able to make his needs known.  There is no family at bedside at this time.  [name] lunch tray is in front of him and he is feeding himself with no issues.  We meet at the bedside to discuss diagnosis prognosis, GOC, EOL wishes, disposition and options.  I introduced Palliative Medicine as specialized medical care for people living with serious illness. It focuses on providing relief from the symptoms and stress of a serious illness. The goal is to improve quality of life for both the patient and the family.  We discussed a brief life review of the patient.  [name] tells me that he has been married to his wife, Kathie Rhodes, for 51 years.  He shares that he has a Museum/gallery conservator, Toniann Fail.  He also has a natural daughter, Keneth Etchells, who lives in [place].  He is retired Engineer, maintenance (IT).  He tells me that he and his wife are working on getting at home set up to meet his needs.  We then focused on their current illness.  We talk about his  respiratory issues.  I share that pulmonology is to see him today.  He states understanding.  He tells me that he feels that he has been relatively independent with ADLs and prefers to return home if at all possible.  PMT will continue to discuss goals of care and outcomes based upon recommendations of pulmonology.  The natural disease trajectory and expectations at EOL were discussed.  Advanced directives, concepts specific to code status, artifical feeding and hydration, and rehospitalization were considered and discussed.  We talked about the concept of "treat the treatable but allow a natural passing".  [name] states that he would want attempted resuscitation.  When asked how long he would want to be on life support he states that this would be based on what the doctor say and his family's choice.  Hospice and Palliative Care services outpatient were not discussed today due to relationship building.  Discussed the importance of continued conversation with family and the medical providers regarding overall plan of care and treatment options, ensuring decisions are within the context of the patient's values  and GOCs.  Questions and concerns were addressed.  The family was encouraged to call with questions or concerns.  PMT will continue to support holistically.  Conference with attending, bedside nursing staff, transition of care team related to patient condition, needs, goals of care, disposition.   HCPOA  NEXT OF KIN -wife of 51 years, Julious Meinecke.    SUMMARY OF RECOMMENDATIONS   At this point continue full scope/full code. Time for outcomes. Ultimate goal is to return home.   Code Status/Advance Care Planning: Full code - We talked about the concept of "treat the treatable but allow a natural passing".  [name] states that he would want attempted resuscitation.  When asked how long he would want to be on life support he states that this would be based on what the doctor say and his  family's choice.  Symptom Management:  Per hospitalist, no additional needs at this time.  Palliative Prophylaxis:  Frequent Pain Assessment  Additional Recommendations (Limitations, Scope, Preferences): Full Scope Treatment  Psycho-social/Spiritual:  Desire for further Chaplaincy support:no Additional Recommendations: Caregiving  Support/Resources  Prognosis:  Unable to determine, based on outcomes.  6 months or less would not be surprising based on chronic illness burden, end-stage lung disease requiring 4 L.  Discharge Planning: To be determined, states wants to return home      Primary Diagnoses: Present on Admission:  Acute on chronic hypoxic respiratory failure (HCC)  B12 deficiency  COVID-19  Hyperlipidemia   I have reviewed the medical record, interviewed the patient and family, and examined the patient. The following aspects are pertinent.  Past Medical History:  Diagnosis Date   Chronic kidney disease    stage 3   Colon polyps    Complication of anesthesia    difficulty urinating    Coronary artery disease    Enlarged prostate    Hyperlipidemia    Hypertension    Social History   Socioeconomic History   Marital status: Married    Spouse name: Not on file   Number of children: 2   Years of education: Not on file   Highest education level: Not on file  Occupational History   Occupation: retired  Tobacco Use   Smoking status: Former    Current packs/day: 0.00    Types: Cigarettes    Quit date: 07/16/1972    Years since quitting: 51.1   Smokeless tobacco: Never  Vaping Use   Vaping status: Never Used  Substance and Sexual Activity   Alcohol use: No   Drug use: No   Sexual activity: Never  Other Topics Concern   Not on file  Social History Narrative   Not on file   Social Determinants of Health   Financial Resource Strain: Not on file  Food Insecurity: No Food Insecurity (08/13/2023)   Hunger Vital Sign    Worried About Running Out of  Food in the Last Year: Never true    Ran Out of Food in the Last Year: Never true  Transportation Needs: No Transportation Needs (08/13/2023)   PRAPARE - Administrator, Civil Service (Medical): No    Lack of Transportation (Non-Medical): No  Physical Activity: Not on file  Stress: Not on file  Social Connections: Not on file   Family History  Problem Relation Age of Onset   Heart attack Mother    Tuberculosis Maternal Grandfather    Adrenal disorder Neg Hx    Colon cancer Neg Hx    Stomach cancer  Neg Hx    Pancreatic cancer Neg Hx    Esophageal cancer Neg Hx    Scheduled Meds:  aspirin EC  81 mg Oral Daily   cholecalciferol  5,000 Units Oral Daily   clopidogrel  75 mg Oral Daily   dextromethorphan-guaiFENesin  1 tablet Oral BID   docusate sodium  100 mg Oral BID   enoxaparin (LOVENOX) injection  40 mg Subcutaneous Q24H   fluticasone  1 spray Each Nare Daily   gabapentin  100 mg Oral TID   ipratropium-albuterol  3 mL Nebulization BID   methylPREDNISolone (SOLU-MEDROL) injection  80 mg Intravenous Q24H   pantoprazole  40 mg Oral Daily   polyethylene glycol  17 g Oral Daily   sodium chloride  2 spray Each Nare Q4H   Continuous Infusions: PRN Meds:.acetaminophen **OR** acetaminophen, albuterol, Dimethicone, hydrALAZINE, ondansetron **OR** ondansetron (ZOFRAN) IV, oxyCODONE, senna-docusate Medications Prior to Admission:  Prior to Admission medications   Medication Sig Start Date End Date Taking? Authorizing Provider  aspirin EC 81 MG tablet Take 81 mg by mouth daily.   Yes [provider]  Cholecalciferol (VITAMIN D HIGH POTENCY) 25 MCG (1000 UT) capsule Take 5,000 Units by mouth at bedtime.   Yes [provider]  clopidogrel (PLAVIX) 75 MG tablet Take 75 mg by mouth daily. 1 Tablet Daily   Yes [provider]  Cyanocobalamin (VITAMIN B-12 IJ) Inject 1 mL as directed every 30 (thirty) days.    Yes [provider]   ipratropium-albuterol (DUONEB) 0.5-2.5 (3) MG/3ML SOLN Take 3 mLs by nebulization 3 (three) times daily. 08/01/23  Yes Johnson, Clanford L, MD  oxycodone (ROXICODONE) 30 MG immediate release tablet Take 30 mg by mouth every 6 (six) hours. 07/23/23  Yes [provider]  mupirocin ointment (BACTROBAN) 2 % Apply 1 Application topically 3 (three) times daily. 08/17/23   [provider]  nitroGLYCERIN (NITROSTAT) 0.4 MG SL tablet Place 0.4 mg under the tongue every 5 (five) minutes as needed.  Patient not taking: Reported on 08/13/2023 06/23/17   [provider]   Allergies  Allergen Reactions   Crestor [Rosuvastatin]    Paroxetine Rash   Review of Systems  Unable to perform ROS: Age    Physical Exam Vitals and nursing note reviewed.  Constitutional:      General: He is not in acute distress.    Appearance: He is ill-appearing.  Cardiovascular:     Rate and Rhythm: Normal rate.  Pulmonary:     Effort: Pulmonary effort is normal. No tachypnea, accessory muscle usage or respiratory distress.  Skin:    General: Skin is warm and dry.  Neurological:     Mental Status: He is alert and oriented to person, place, and time.  Psychiatric:        Mood and Affect: Mood normal.        Behavior: Behavior normal.     Vital Signs: BP 103/61 (BP Location: Right Arm)   Pulse 77   Temp 97.6 F (36.4 C) (Oral)   Resp 17   Ht 6\' 2"  (1.88 m)   Wt 70.8 kg   SpO2 94%   BMI 20.04 kg/m  Pain Scale: 0-10   Pain Score: 0-No pain   SpO2: SpO2: 94 % O2 Device:SpO2: 94 % O2 Flow Rate: .O2 Flow Rate (L/min): 5 L/min  IO: Intake/output summary:  Intake/Output Summary (Last 24 hours) at 08/23/2023 0925 Last data filed at 08/23/2023 5621 Gross per 24 hour  Intake --  Output 1675 ml  Net -1675 ml    LBM: Last BM Date : 08/22/23 Baseline Weight: Weight: 76.2 kg Most recent weight: Weight: 70.8 kg     Palliative Assessment/Data:     Time In: 1130 Time Out: 1225 Time  Total: 55 minutes  Greater than 50%  of this time was spent counseling and coordinating care related to the above assessment and plan.  Signed by: Katheran Awe, NP   Please contact Palliative Medicine Team phone at [phone] for questions and concerns.  For individual provider: See Loretha Stapler

## 2023-08-23 NOTE — Telephone Encounter (Signed)
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  Travis Dunn, DO 08/23/23 3:28 PM [facility]  For contact information, see Amion. If no response to pager, please call PCCM consult pager. After hours, 7PM- 7AM, please call Elink.

## 2023-08-23 NOTE — Progress Notes (Signed)
In and out cath done. 900 mL noted. Rebladder scanned patient. 0 now noted.

## 2023-08-24 ENCOUNTER — Inpatient Hospital Stay (HOSPITAL_COMMUNITY): Payer: Medicare HMO

## 2023-08-24 DIAGNOSIS — J9621 Acute and chronic respiratory failure with hypoxia: Secondary | ICD-10-CM | POA: Diagnosis not present

## 2023-08-24 DIAGNOSIS — Z7189 Other specified counseling: Secondary | ICD-10-CM | POA: Diagnosis not present

## 2023-08-24 DIAGNOSIS — Z515 Encounter for palliative care: Secondary | ICD-10-CM | POA: Diagnosis not present

## 2023-08-24 LAB — BLOOD GAS, ARTERIAL
Acid-Base Excess: 5.4 mmol/L — ABNORMAL HIGH (ref 0.0–2.0)
Bicarbonate: 29.9 mmol/L — ABNORMAL HIGH (ref 20.0–28.0)
Drawn by: 27016
O2 Saturation: 97.5 %
Patient temperature: 37
pCO2 arterial: 42 mmHg (ref 32–48)
pH, Arterial: 7.46 — ABNORMAL HIGH (ref 7.35–7.45)
pO2, Arterial: 78 mmHg — ABNORMAL LOW (ref 83–108)

## 2023-08-24 MED ORDER — PREDNISONE 20 MG PO TABS
40.0000 mg | ORAL_TABLET | Freq: Every day | ORAL | Status: DC
  Administered 2023-08-25 – 2023-08-26 (×2): 40 mg via ORAL
  Filled 2023-08-24 (×2): qty 2

## 2023-08-24 MED ORDER — IOHEXOL 350 MG/ML SOLN
75.0000 mL | Freq: Once | INTRAVENOUS | Status: AC | PRN
  Administered 2023-08-24: 75 mL via INTRAVENOUS

## 2023-08-24 MED ORDER — SULFAMETHOXAZOLE-TRIMETHOPRIM 800-160 MG PO TABS
1.0000 | ORAL_TABLET | ORAL | Status: DC
  Administered 2023-08-25: 1 via ORAL
  Filled 2023-08-24: qty 1

## 2023-08-24 NOTE — TOC Progression Note (Addendum)
Transition of Care (TOC) - Progression Note    Patient Details  Name: Travis Woodward. MRN: [id number] Date of Birth: Jul 31, 1941  Transition of Care [facility]) CM/SW Contact  Roselinda Bahena A Naviyah Schaffert, RN Phone Number: 08/24/2023, 2:12 PM  Clinical Narrative:    Addendum: ABG order is in. [employer] rep to f/u w/attending MD once results are posted for ABG and O2 eval re: NIV equipment.  HH PT/RN order in and for Freestanding Trapeze bar. TOC will update [employer] of dme order.   Palliative and Pulmonary have consulted. Declined outpt palliative. Patient's goal is to return home. Has Equipment needs such as "Free standing Trapeze Bar" order to be in at discharge, has home O2 with [employer]. Was informed by [facility] pending order for NIV equipment pending ABG order. Pt previously referred to [facility] for PT/RN. Per MD, pt may go home tomorrow.   Expected Discharge Plan: Home w Home Health Services Barriers to Discharge: Continued Medical Work up  Expected Discharge Plan and Services In-house Referral: Clinical Social Work Discharge Planning Services: CM Consult Post Acute Care Choice: Durable Medical Equipment, Home Health Living arrangements for the past 2 months: Single Family Home                 DME Arranged: Trapeze DME Agency: [employer] Date DME Agency Contacted: 08/19/23 Time DME Agency Contacted: [phone] Representative spoke with at DME Agency: Ian Malkin HH Arranged: RN, PT HH Agency: [facility] Date [facility] Agency Contacted: 08/19/23 Time HH Agency Contacted: 1331 Representative spoke with at [facility] Agency: Kandee Keen   Social Determinants of Health (SDOH) Interventions SDOH Screenings   Food Insecurity: No Food Insecurity (08/13/2023)  Housing: High Risk (08/13/2023)  Transportation Needs: No Transportation Needs (08/13/2023)  Utilities: Patient Declined (08/13/2023)  Tobacco Use: Medium Risk (08/23/2023)    Readmission Risk Interventions    08/01/2023   12:59 PM  Readmission Risk Prevention Plan   Post Dischage Appt Complete  Medication Screening Complete  Transportation Screening Complete

## 2023-08-24 NOTE — Progress Notes (Signed)
Palliative: Travis Woodward is lying quietly in bed.  He appears chronically ill and somewhat frail.  He greets me, making and mostly keeping eye contact.  He is alert and oriented x 3, able to make his needs known.  Patient and family at bedside at this time.  Travis Woodward tells me that he has met with pulmonary physician.  He shares that things are different today.  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  He states that he understands he is to follow out patient with pulmonology for medication treatment which cannot be started inpatient.  He shares that his ultimate goal is to return to his own home.  He continues to share that his wife and Child psychotherapist are working to meet his equipment needs at home.  We talk about the benefits of outpatient palliative services.  At this point he states he will consider the services but does not accept.  He also remains full scope/full code.  He tells me that he will never "think negative".  Conference with attending, bedside nursing staff, transition of care team related to patient condition, needs, goals of care, disposition.  Plan: At this point continue full scope/full code.  Home with home health when able.  Follow-up with pulmonary outpatient.  Considering outpatient palliative services  50 minutes Lillia Carmel, NP Palliative medicine team Team phone [phone] Greater than 50% of this time was spent counseling and coordinating care related to the above assessment and plan.

## 2023-08-24 NOTE — Progress Notes (Signed)
Ddimer elevated 7.05 CT scan-- no significant change in fibrosis, adjusting for different Ct techniques. Distribution of abnormalities is the same-- does not appear to have progressed to new areas of lung over the past 1.5 weeks.   I reviewed my recommendations with Dr. Bennie Pierini this morning-- suspect this will not be a steroid responsive process, so I would reduce his steroids back to a home prendisone dose of 40mg  daily + Bactrim MWF until he sees Pulmonology for follow up.  Needs ambulatory desaturation screening to determine oxygen requirements at rest and with activity before discharge. Has a home concentrator from his last admission.   My understanding from the [facility] device Rep is he has hypercapnia on an ABG, but this has not been loaded into the system yet. Reasonable to consider Trilogy for him. Recommend against a vest at this time unless he is struggling with secretion management.   Steffanie Dunn, DO 08/24/23 2:39 PM [facility]  For contact information, see Amion. If no response to pager, please call PCCM consult pager. After hours, 7PM- 7AM, please call Elink.

## 2023-08-24 NOTE — Progress Notes (Signed)
PROGRESS NOTE   [name].  QIH:[id number]    DOB: [birth year]    DOA: 08/13/2023  PCP: Elfredia Nevins, MD   I have briefly reviewed patients previous medical records in [facility].  Chief Complaint  Patient presents with   Shortness of Breath    Brief Narrative:  82 year old male with PMH of CKD, CAD, HLD, HTN, hospitalization 8/1 - 8/4 for acute respiratory failure with hypoxia due to COVID 19 infection and AKI, discharged on prednisone, presented again to the ED on 8/16 secondary to continued significant dyspnea, mainly exertional.  In the ED he was noted to be hypoxic in the low 80s on 4 L/min [place] oxygen.  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  Admitted for acute on chronic respiratory failure with hypoxia.  Ongoing very significant hypoxia requiring high O2 even with minimal activity such as sitting up at side of bed.  Palliative care consulted, patient wished to be full code, full scope, focused on recovery and return home. Tele Pulmonology consulted.   Assessment & Plan:  Principal Problem:   Acute on chronic hypoxic respiratory failure (HCC) Active Problems:   CAD S/P percutaneous coronary angioplasty   Hyperlipidemia   COVID-19   B12 deficiency   Acute on chronic respiratory failure with hypoxia Chronically on 4 L/min [place] oxygen On initial presentation required 10-15 L. 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 Had been on high-dose IV Solu-Medrol 80 mg twice daily since admission, reduced to 80 mg daily and could continue rapidly tapering off.  Not sure if there is much benefit of continuing it any longer.  Unremarkable inflammatory markers and morning labs (ESR, CRP, LDH normal.  Ferritin very minimally up). Aggressive pulmonary toilet Overall poor prognosis. Discussed in detail with patient and spouse at bedside 8/25 and they verbalized understanding. Palliative care has consulted, patient wishes to be full code, full scope, focused on recovery  and return home.  Tele Pulmonology consulted.  Communicated with Dr. Chestine Spore a couple times.  Her input much appreciated.  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  She recommends continued prednisone 40 mg daily until outpatient pulmonology follow-up with pulmonology, Bactrim PJP prophylaxis Monday Wednesday Friday, outpatient pulmonology follow-up for consideration of antifibrotic agents (which cannot be started as inpatient).  D-dimer was elevated, followed up with CTA chest which was negative for PE but showed extensive predominantly dependent bibasilar fibrosis, bronchiectasis and consolidation unchanged from imaging from 8/16. Patient has appointment to see Dr. Sherene Sires, pulmonology on 09/14/2023 at 3 PM followed by Dr. Isaiah Serge or Dr. Marchelle Gearing on 10/06/2023 at 11:15 AM for post-COVID fibrosis. Currently awaiting formal home O2 evaluation.  Ongoing severe dyspnea and hypoxia with minimal exertion.  Although his O2 requirement since this morning seems to have been down to 2 L, only able to move around in bed using his trapeze bar and then to desaturates.  ABG does not qualify him for home NIPPV.  Hyperkalemia/hypokalemia: Potassium has normalized.  AKI Had creatinine of 1.51 on 8/19. Resolved  Normocytic anemia Stable  Thrombocytopenia Stable  Physical deconditioning: Therapies have evaluated and recommend home health PT and OT.  Biggest barrier currently is exertional dyspnea and hypoxia on minimal exertion. Has a trapeze bar in bed to facilitate repositioning and physical activity which she has been using.  Chronic pain syndrome Continue home regimen and avoid escalating opioids especially given his respiratory status  GERD: PPI  Constipation Continue bowel regimen.  Failed Dulcolax suppository. Had a large satisfactory BM after milk and molasses enema on 8/25.  Acute urinary  retention: Developed overnight.?  Related to enema Had in and out catheterization yielding 900 mL urine. Since then patient has appears to be voiding by himself.  Adult failure to thrive Palliative care consultation input appreciated.  Body mass index is 20.04 kg/m.  Pressure Ulcer: Pressure Injury 08/13/23 Rectum Left Stage 2 -  Partial thickness loss of dermis presenting as a shallow open injury with a red, pink wound bed without slough. (Active)  08/13/23 2131  Location: Rectum  Location Orientation: Left  Staging: Stage 2 -  Partial thickness loss of dermis presenting as a shallow open injury with a red, pink wound bed without slough.  Wound Description (Comments):   Present on Admission: Yes  Dressing Type Foam - Lift dressing to assess site every shift 08/22/23 4401    ACP Documents: None present DVT prophylaxis: enoxaparin (LOVENOX) injection 40 mg Start: 08/13/23 2200     Code Status: Full Code:  Family Communication: None at bedside. Disposition:  Ongoing dyspnea and significant hypoxia with minimal activity.  Transitioned to p.o. meds.  Pending formal home oxygen valuation and arrangement of home health DME's, DC home likely tomorrow.     Consultants:     Procedures:     Antimicrobials:      Subjective:  Had chronic left hip pain radiating to his leg, not new, was waiting for his pain meds.  No change in breathing issues.  Objective:   Vitals:   08/23/23 2129 08/24/23 0511 08/24/23 0729 08/24/23 1401  BP: (!) 99/58 (!) 108/59  (!) 103/58  Pulse: 92 83    Resp: 18 20  20   Temp: 98.7 F (37.1 C) 97.8 F (36.6 C)  98.5 F (36.9 C)  TempSrc:      SpO2: 93% 98% 97% 95%  Weight:      Height:        General exam: Elderly male, moderately built and nourished, very pleasant, lying comfortably propped up in bed. Respiratory system: Clear to auscultation. Respiratory effort normal.  Few chronic sounding basal crackles.  No increased work of breathing while  at rest. Cardiovascular system: S1 & S2 heard, RRR. No JVD, murmurs, rubs, gallops or clicks. No pedal edema. Gastrointestinal system: Abdomen is nondistended, soft and nontender. No organomegaly or masses felt. Normal bowel sounds heard. Central nervous system: Alert and oriented. No focal neurological deficits. Extremities: Symmetric 5 x 5 power. Skin: No rashes, lesions or ulcers Psychiatry: Judgement and insight appear normal. Mood & affect pleasant, positive despite serious illness above.  States that he will take a day at a time.    Data Reviewed:   I have personally reviewed following labs and imaging studies   CBC: Recent Labs  Lab 08/19/23 0437 08/21/23 0613 08/23/23 0508  WBC 10.3 8.0 8.4  HGB 9.5* 9.5* 9.4*  HCT 29.6* 30.2* 30.0*  MCV 95.2 95.3 95.8  PLT 98* 98* 110*    Basic Metabolic Panel: Recent Labs  Lab 08/19/23 0437 08/21/23 0613 08/22/23 0444 08/23/23 0508  NA 133* 135 134* 138  K 4.4 5.3* 3.4* 4.9  CL 96* 99 100 102  CO2 33* 31 29 30   GLUCOSE 121* 118* 106* 95  BUN 39* 31* 30* 27*  CREATININE 1.31* 1.16 1.15 1.09  CALCIUM 9.0 9.2 8.6* 8.8*    Liver Function Tests: No results for input(s): "AST", "ALT", "ALKPHOS", "BILITOT", "PROT", "  ALBUMIN" in the last 168 hours.  CBG: Recent Labs  Lab 08/19/23 1127  GLUCAP 137*    Microbiology Studies:  No results found for this or any previous visit (from the past 240 hour(s)).  Radiology Studies:  CT Angio Chest Pulmonary Embolism (PE) W or WO Contrast  Result Date: 08/24/2023 CLINICAL DATA:  PE suspected, shortness of breath and hypoxia, positive D-dimer EXAM: CT ANGIOGRAPHY CHEST WITH CONTRAST TECHNIQUE: Multidetector CT imaging of the chest was performed using the standard protocol during bolus administration of intravenous contrast. Multiplanar CT image reconstructions and MIPs were obtained to evaluate the vascular anatomy. RADIATION DOSE REDUCTION: This exam was performed according to the  departmental dose-optimization program which includes automated exposure control, adjustment of the mA and/or kV according to patient size and/or use of iterative reconstruction technique. CONTRAST:  75mL OMNIPAQUE IOHEXOL 350 MG/ML SOLN COMPARISON:  08/13/2023 FINDINGS: Cardiovascular: Satisfactory opacification of the pulmonary arteries to the segmental level. No evidence of pulmonary embolism. Cardiomegaly. Left and right coronary artery calcifications. No pericardial effusion. Aortic atherosclerosis. Mediastinum/Nodes: No enlarged mediastinal, hilar, or axillary lymph nodes. Thyroid gland, trachea, and esophagus demonstrate no significant findings. Lungs/Pleura: Examination is somewhat limited by breath motion artifact. Extensive, predominantly dependent bibasilar fibrosis, bronchiectasis, and consolidation (series 6, image 97). No pleural effusion or pneumothorax. Upper Abdomen: No acute abnormality. Large burden of stool in the included colon. Musculoskeletal: No chest wall abnormality. No acute osseous findings. Review of the MIP images confirms the above findings. IMPRESSION: 1. Negative examination for pulmonary embolism. 2. Extensive, predominantly dependent bibasilar fibrosis, bronchiectasis, and consolidation unchanged when compared to recent CT dated 08/13/2023 although new in comparison to prior examination dated 09/07/2019. This most likely reflects some combination of acute airspace disease and chronic, fibrotic sequelae of prior infection or aspiration. 3. Cardiomegaly and coronary artery disease. 4. Large burden of stool. Aortic Atherosclerosis (ICD10-I70.0). Electronically Signed   By: Jearld Lesch M.D.   On: 08/24/2023 13:02   DG CHEST PORT 1 VIEW  Result Date: 08/23/2023 CLINICAL DATA:  Hypoxia EXAM: PORTABLE CHEST 1 VIEW COMPARISON:  08/13/2023 FINDINGS: Cardiac shadow is enlarged but stable. Aortic calcifications are seen. Increasing bibasilar infiltrates are noted superimposed over  fibrotic changes. Old rib fractures are noted on the left. No acute bony abnormality is noted. IMPRESSION: Increasing bibasilar infiltrates. Electronically Signed   By: Alcide Clever M.D.   On: 08/23/2023 19:04    Scheduled Meds:    aspirin EC  81 mg Oral Daily   cholecalciferol  5,000 Units Oral Daily   clopidogrel  75 mg Oral Daily   dextromethorphan-guaiFENesin  1 tablet Oral BID   docusate sodium  100 mg Oral BID   enoxaparin (LOVENOX) injection  40 mg Subcutaneous Q24H   fluticasone  1 spray Each Nare Daily   gabapentin  100 mg Oral TID   ipratropium-albuterol  3 mL Nebulization BID   pantoprazole  40 mg Oral Daily   polyethylene glycol  17 g Oral Daily   [START ON 08/25/2023] predniSONE  40 mg Oral Q breakfast   sodium chloride  2 spray Each Nare Q4H   [START ON 08/25/2023] sulfamethoxazole-trimethoprim  1 tablet Oral Once per day on Monday Wednesday Friday    Continuous Infusions:     LOS: 11 days     Marcellus Scott, MD,  FACP, [facility], [facility], [facility], [facility]  [facility] [place]     To contact the attending provider between 7A-7P or the covering provider during after  hours 7P-7A, please log into the web site [email or web address] and access using universal [place] password for that web site. If you do not have the password, please call the hospital operator.  08/24/2023, 3:35 PM

## 2023-08-25 DIAGNOSIS — J9600 Acute respiratory failure, unspecified whether with hypoxia or hypercapnia: Secondary | ICD-10-CM

## 2023-08-25 DIAGNOSIS — I2510 Atherosclerotic heart disease of native coronary artery without angina pectoris: Secondary | ICD-10-CM | POA: Diagnosis not present

## 2023-08-25 DIAGNOSIS — J9621 Acute and chronic respiratory failure with hypoxia: Secondary | ICD-10-CM | POA: Diagnosis not present

## 2023-08-25 DIAGNOSIS — E538 Deficiency of other specified B group vitamins: Secondary | ICD-10-CM | POA: Diagnosis not present

## 2023-08-25 NOTE — Progress Notes (Signed)
Palliative:  Chart review completed.  Face-to-face discussion with transition of care team related to respiratory care needs.  Plan: At this point continue full scope/full code.  Goal is to return home.  Follow-up with pulmonology outpatient.    No charge Lillia Carmel, NP Pallitive Medicine Team  Team phone [phone] Greater than 50% of this time was spent counseling and coordinating care related to the above assessment and plan.

## 2023-08-25 NOTE — Plan of Care (Signed)
  Problem: Education: Goal: Knowledge of risk factors and measures for prevention of condition will improve Outcome: Progressing   Problem: Coping: Goal: Psychosocial and spiritual needs will be supported Outcome: Progressing   Problem: Respiratory: Goal: Will maintain a patent airway Outcome: Progressing   Problem: Education: Goal: Knowledge of General Education information will improve Description: Including pain rating scale, medication(s)/side effects and non-pharmacologic comfort measures Outcome: Progressing   Problem: Nutrition: Goal: Adequate nutrition will be maintained Outcome: Progressing

## 2023-08-25 NOTE — Progress Notes (Signed)
[facility]  PROGRESS NOTE  [name]. ZOX:[id number] DOB: 03-29-1941 DOA: 08/13/2023 PCP: Elfredia Nevins, MD   Brief HPI:   82 year old male with PMH of CKD, CAD, HLD, HTN, hospitalization 8/1 - 8/4 for acute respiratory failure with hypoxia due to COVID 19 infection and AKI, discharged on prednisone, presented again to the ED on 8/16 secondary to continued significant dyspnea, mainly exertional.  In the ED he was noted to be hypoxic in the low 80s on 4 L/min [place] oxygen.  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  Admitted for acute on chronic respiratory failure with hypoxia.   Ongoing very significant hypoxia requiring high O2 even with minimal activity such as sitting up at side of bed.  Palliative care consulted, patient wished to be full code, full scope, focused on recovery and return home. Tele Pulmonology consulted.    Assessment/Plan:   Acute on chronic respiratory failure with hypoxia Chronically on 4 L/min [place] oxygen On initial presentation required 10-15 L. 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 Had been on high-dose IV Solu-Medrol 80 mg twice daily since admission, reduced to 80 mg dail Unremarkable inflammatory markers and morning labs (ESR, CRP, LDH normal.  Ferritin very minimally up). Aggressive pulmonary toilet Overall poor prognosis. Discussed in detail with patient and spouse at bedside 8/25 and they verbalized understanding. Palliative care has consulted, patient wishes to be full code, full scope, focused on recovery and return home.  Tele Pulmonology consulted.  Communicated with Dr. Chestine Spore a couple times.  Her input much appreciated.  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  She recommends continued prednisone 40 mg daily until outpatient pulmonology follow-up with pulmonology, Bactrim PJP prophylaxis Monday Wednesday Friday,  outpatient pulmonology follow-up for consideration of antifibrotic agents (which cannot be started as inpatient).  D-dimer was elevated, followed up with CTA chest which was negative for PE but showed extensive predominantly dependent bibasilar fibrosis, bronchiectasis and consolidation unchanged from imaging from 8/16. Patient has appointment to see Dr. Sherene Sires, pulmonology on 09/14/2023 at 3 PM followed by Dr. Isaiah Serge or Dr. Marchelle Gearing on 10/06/2023 at 11:15 AM for post-COVID fibrosis. -Currently awaiting formal home O2 evaluation, will discharge home on oxygen.  Patient is slowly improving.  Today requiring 2 L/min of oxygen via nasal cannula.   ABG does not qualify him for home NIPPV. -Dr. Karie Fetch has discussed with patient's wife and explained to her that patient will not qualify for home NIPPV.   Hyperkalemia/hypokalemia: Potassium has normalized.   AKI Had creatinine of 1.51 on 8/19. Resolved   Normocytic anemia Stable   Thrombocytopenia Stable   Physical deconditioning: Therapies have evaluated and recommend home health PT and OT.  Biggest barrier currently is exertional dyspnea and hypoxia on minimal exertion. Has a trapeze bar in bed to facilitate repositioning and physical activity which she has been using.   Chronic pain syndrome Continue home regimen and avoid escalating opioids especially given his respiratory status   GERD: PPI   Constipation Continue bowel regimen.  Failed Dulcolax suppository. Had a large satisfactory BM after milk and molasses enema on 8/25.   Acute urinary retention: -Resolved Had in and out catheterization yielding 900 mL urine.    Adult failure to thrive Palliative care consultation input appreciated.    Medications     aspirin EC  81 mg Oral Daily   cholecalciferol  5,000 Units Oral Daily   clopidogrel  75 mg Oral Daily   dextromethorphan-guaiFENesin  1 tablet Oral BID   docusate sodium  100 mg Oral BID   enoxaparin (LOVENOX)  injection  40 mg Subcutaneous Q24H   fluticasone  1 spray Each Nare Daily   gabapentin  100 mg Oral TID   ipratropium-albuterol  3 mL Nebulization BID   pantoprazole  40 mg Oral Daily   polyethylene glycol  17 g Oral Daily   predniSONE  40 mg Oral Q breakfast   sodium chloride  2 spray Each Nare Q4H   sulfamethoxazole-trimethoprim  1 tablet Oral Once per day on Monday Wednesday Friday     Data Reviewed:   CBG:  Recent Labs  Lab 08/19/23 1127  GLUCAP 137*    SpO2: 95 % O2 Flow Rate (L/min): 2 L/min    Vitals:   08/24/23 2057 08/24/23 2121 08/25/23 0340 08/25/23 0716  BP:  105/60 108/61   Pulse:  85 74   Resp:  20 18   Temp:  98.5 F (36.9 C) 98.9 F (37.2 C)   TempSrc:  Oral Oral   SpO2: 97% 95% 95% 95%  Weight:      Height:          Data Reviewed:  Basic Metabolic Panel: Recent Labs  Lab 08/19/23 0437 08/21/23 0613 08/22/23 0444 08/23/23 0508  NA 133* 135 134* 138  K 4.4 5.3* 3.4* 4.9  CL 96* 99 100 102  CO2 33* 31 29 30   GLUCOSE 121* 118* 106* 95  BUN 39* 31* 30* 27*  CREATININE 1.31* 1.16 1.15 1.09  CALCIUM 9.0 9.2 8.6* 8.8*    CBC: Recent Labs  Lab 08/19/23 0437 08/21/23 0613 08/23/23 0508  WBC 10.3 8.0 8.4  HGB 9.5* 9.5* 9.4*  HCT 29.6* 30.2* 30.0*  MCV 95.2 95.3 95.8  PLT 98* 98* 110*    LFT No results for input(s): "AST", "ALT", "ALKPHOS", "BILITOT", "PROT", "ALBUMIN" in the last 168 hours.   Antibiotics: Anti-infectives (From admission, onward)    Start     Dose/Rate Route Frequency Ordered Stop   08/25/23 0900  sulfamethoxazole-trimethoprim (BACTRIM DS) 800-160 MG per tablet 1 tablet        1 tablet Oral Once per day on Monday Wednesday Friday 08/24/23 1451          DVT prophylaxis: Lovenox  Code Status: Full code  Family Communication: Discussed with patient's wife on phone   CONSULTS palliative care, pulmonology   Subjective   Still complains of dyspnea on exertion.   Objective    Physical  Examination:   General-appears in no acute distress Heart-S1-S2, regular, no murmur auscultated Lungs-decreased breath sounds at lung bases Abdomen-soft, nontender, no organomegaly Extremities-no edema in the lower extremities Neuro-alert, oriented x3, no focal deficit noted  Status is: Inpatient:      Pressure Injury 08/13/23 Rectum Left Stage 2 -  Partial thickness loss of dermis presenting as a shallow open injury with a red, pink wound bed without slough. (Active)  08/13/23 2131  Location: Rectum  Location Orientation: Left  Staging: Stage 2 -  Partial thickness loss of dermis presenting as a shallow open injury with a red, pink wound bed without slough.  Wound Description (Comments):   Present on Admission: Yes        Olamide Lahaie S Pama Roskos   [facility] If 7PM-7AM, please contact night-coverage at [email or web address], Office  [phone]   08/25/2023, 8:17 AM  LOS: 12 days

## 2023-08-25 NOTE — Progress Notes (Signed)
Notified by primary team that there was confusion regarding trilogy. He does not quality without hypercapnia.   ABG on 08/24/2023   7.46/42/78/30  Discussed with his wife Kathie Rhodes over the phone that he would not benefit and a trilogy will not be prescribed. He will need an oxygen desaturation screening prior to discharge to determine his ambulatory oxygen requirements compared to when he is sitting still. He should turn up his oxygen before he gets up to walk to prevent his saturations from dropping while he walks around at home.   Recommend discharge on daily prednisone and MWF Bactrim until he is able to be seen in Pulmonary clinic. I worry that higher dose steroids pose increased risk of side effects without evidence that he has benefited from them over the past few weeks. OP follow up scheduled previously.   Steffanie Dunn, DO 08/25/23 12:53 PM [facility]  For contact information, see Amion. If no response to pager, please call PCCM consult pager. After hours, 7PM- 7AM, please call Elink.

## 2023-08-25 NOTE — Progress Notes (Signed)
Physical Therapy Treatment Patient Details Name: Travis Woodward. MRN: [id number] DOB: 12-Dec-1941 Today's Date: 08/25/2023   History of Present Illness Travis Woodward  is a 82 y.o. male, with medical history significant of CKD, CAD, hyperlipidemia, hypertension, hospitalization secondary to COVID infection, treated with antibiotics, steroids, patient was discharged on prednisone.  -Patient presents to ED secondary to shortness of breath, patient reports he continued to have significant dyspnea upon discharge, mainly with activity, this has much worsened recently, as he was significantly dyspneic with minimal activity has not been doing much movement, he denies fever, chills, chest pain, he reports cough, but nonproductive, no nausea or vomiting   -In ED patient was noted to be in the lower 80s on his 4 L nasal cannula, and he is currently on 8 L nasal cannula at rest, chest x-ray with bilateral opacities, but BNP within normal limit 71, procalcitonin low at 0.24, he is with leukocytosis at 14.4, 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    PT Comments  Patient demonstrates labored movement for sitting up at bedside with [facility] raised, required frequent rest breaks due to difficulty breathing with SpO2 dropping from 81% to 76% during exertion, unable to lock knees when using RW due to BLE weakness and required Max stand pivot with knees blocked to transfer to chair.  Patient spouse present and states she still plans to take him home and have enough help.  Patient tolerated sitting up in chair after therapy.  Patient left on 3 LPM O2 with SpO2 at 85% - RN notified. Patient will benefit from continued skilled physical therapy in hospital and recommended venue below to increase strength, balance, endurance for safe ADLs and gait.      If plan is discharge home, recommend the following: A lot of help with walking and/or transfers;A little help with  bathing/dressing/bathroom;Help with stairs or ramp for entrance;Assistance with cooking/housework   Can travel by private vehicle        Equipment Recommendations  None recommended by PT    Recommendations for Other Services       Precautions / Restrictions Precautions Precautions: Fall Restrictions Weight Bearing Restrictions: No     Mobility  Bed Mobility Overal bed mobility: Needs Assistance Bed Mobility: Supine to Sit     Supine to sit: Supervision, HOB elevated     General bed mobility comments: increased time, required HOB raised    Transfers Overall transfer level: Needs assistance Equipment used: Rolling walker (2 wheels), 1 person hand held assist Transfers: Sit to/from Stand, Bed to chair/wheelchair/BSC Sit to Stand: Mod assist, Max assist Stand pivot transfers: Mod assist         General transfer comment: limited to brief standing using RW due to knees buckling, required stand pivot with left knee blocked to transfer to chair    Ambulation/Gait                   Stairs             Wheelchair Mobility     Tilt Bed    Modified Rankin (Stroke Patients Only)       Balance Overall balance assessment: Needs assistance Sitting-balance support: Feet supported, No upper extremity supported Sitting balance-Leahy Scale: Fair Sitting balance - Comments: fair/good seated at EOB   Standing balance support: During functional activity, Reliant on assistive device for balance, Bilateral upper extremity supported Standing balance-Leahy Scale: Poor Standing balance comment: using RW  Cognition Arousal: Alert Behavior During Therapy: WFL for tasks assessed/performed Overall Cognitive Status: Within Functional Limits for tasks assessed                                          Exercises General Exercises - Lower Extremity Long Arc Quad: Seated, AROM, Strengthening, Both, 10 reps Hip  Flexion/Marching: Seated, AROM, Strengthening, Both, 10 reps Toe Raises: Seated, AROM, Strengthening, Both, 10 reps Heel Raises: Seated, AROM, Strengthening, Both, 10 reps    General Comments        Pertinent Vitals/Pain Pain Assessment Pain Assessment: Faces Faces Pain Scale: Hurts a little bit Pain Location: low back, legs Pain Descriptors / Indicators: Discomfort Pain Intervention(s): Limited activity within patient's tolerance, Monitored during session, Repositioned    Home Living                          Prior Function            PT Goals (current goals can now be found in the care plan section) Acute Rehab PT Goals Patient Stated Goal: return home PT Goal Formulation: With patient/family Time For Goal Achievement: 08/30/23 Potential to Achieve Goals: Good Progress towards PT goals: Progressing toward goals    Frequency    Min 3X/week      PT Plan      Co-evaluation              AM-PAC PT "6 Clicks" Mobility   Outcome Measure  Help needed turning from your back to your side while in a flat bed without using bedrails?: None Help needed moving from lying on your back to sitting on the side of a flat bed without using bedrails?: A Little Help needed moving to and from a bed to a chair (including a wheelchair)?: A Lot Help needed standing up from a chair using your arms (e.g., wheelchair or bedside chair)?: A Lot Help needed to walk in hospital room?: A Lot Help needed climbing 3-5 steps with a railing? : Total 6 Click Score: 14    End of Session Equipment Utilized During Treatment: Oxygen Activity Tolerance: Patient tolerated treatment well;Patient limited by fatigue Patient left: in chair;with call bell/phone within reach;with family/visitor present Nurse Communication: Mobility status PT Visit Diagnosis: Unsteadiness on feet (R26.81);Other abnormalities of gait and mobility (R26.89);Muscle weakness (generalized) (M62.81)     Time:  1610-9604 PT Time Calculation (min) (ACUTE ONLY): 20 min  Charges:    $Therapeutic Exercise: 8-22 mins $Therapeutic Activity: 8-22 mins PT General Charges $$ ACUTE PT VISIT: 1 Visit                     12:32 PM, 08/25/23 Ocie Bob, MPT Physical Therapist with [facility] [phone] office [phone] mobile phone

## 2023-08-26 DIAGNOSIS — Z515 Encounter for palliative care: Secondary | ICD-10-CM | POA: Diagnosis not present

## 2023-08-26 DIAGNOSIS — U071 COVID-19: Secondary | ICD-10-CM | POA: Diagnosis not present

## 2023-08-26 DIAGNOSIS — J9621 Acute and chronic respiratory failure with hypoxia: Secondary | ICD-10-CM | POA: Diagnosis not present

## 2023-08-26 DIAGNOSIS — E782 Mixed hyperlipidemia: Secondary | ICD-10-CM | POA: Diagnosis not present

## 2023-08-26 DIAGNOSIS — I2510 Atherosclerotic heart disease of native coronary artery without angina pectoris: Secondary | ICD-10-CM | POA: Diagnosis not present

## 2023-08-26 DIAGNOSIS — Z7189 Other specified counseling: Secondary | ICD-10-CM | POA: Diagnosis not present

## 2023-08-26 MED ORDER — SULFAMETHOXAZOLE-TRIMETHOPRIM 800-160 MG PO TABS: 1.0000 | ORAL_TABLET | ORAL | 1 refills | Status: DC

## 2023-08-26 MED ORDER — POLYETHYLENE GLYCOL 3350 17 G PO PACK: 17.0000 g | PACK | Freq: Every day | ORAL | 0 refills | Status: DC

## 2023-08-26 MED ORDER — SENNOSIDES-DOCUSATE SODIUM 8.6-50 MG PO TABS: 1.0000 | ORAL_TABLET | Freq: Every evening | ORAL | 0 refills | Status: DC | PRN

## 2023-08-26 MED ORDER — DOCUSATE SODIUM 100 MG PO CAPS: 100.0000 mg | ORAL_CAPSULE | Freq: Two times a day (BID) | ORAL | 0 refills | Status: DC

## 2023-08-26 MED ORDER — PREDNISONE 20 MG PO TABS: 40.0000 mg | ORAL_TABLET | Freq: Every day | ORAL | 1 refills | Status: DC

## 2023-08-26 NOTE — Discharge Summary (Signed)
Physician Discharge Summary   Patient: Travis Woodward. MRN: [id number] DOB: 1941/08/04  Admit date:     08/13/2023  Discharge date: 08/26/23  Discharge Physician: Meredeth Ide   PCP: Elfredia Nevins, MD   Recommendations at discharge:   Follow-up pulmonology as outpatient Continue prednisone 40 mg daily Continue Bactrim Monday Wednesday Friday, 3 times a week  Discharge Diagnoses: Principal Problem:   Acute on chronic hypoxic respiratory failure (HCC) Active Problems:   CAD S/P percutaneous coronary angioplasty   Hyperlipidemia   COVID-19   B12 deficiency  Resolved Problems:   * No resolved hospital problems. *  Hospital Course:  82 year old male with PMH of CKD, CAD, HLD, HTN, hospitalization 8/1 - 8/4 for acute respiratory failure with hypoxia due to COVID 19 infection and AKI, discharged on prednisone, presented again to the ED on 8/16 secondary to continued significant dyspnea, mainly exertional.  In the ED he was noted to be hypoxic in the low 80s on 4 L/min [place] oxygen.  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  Admitted for acute on chronic respiratory failure with hypoxia.   Ongoing very significant hypoxia requiring high O2 even with minimal activity such as sitting up at side of bed.  Palliative care consulted, patient wished to be full code, full scope, focused on recovery and return home. Tele Pulmonology consulted.  Assessment and Plan:  Acute on chronic respiratory failure with hypoxia Chronically on 4 L/min [place] oxygen On initial presentation required 10-15 L. 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 Had been on high-dose IV Solu-Medrol 80 mg twice daily since admission, reduced to 80 mg dail Unremarkable inflammatory markers and morning labs (ESR, CRP, LDH normal.  Ferritin very minimally up). Aggressive pulmonary toilet Overall poor prognosis. Discussed in detail with patient and spouse at bedside 8/25 and they verbalized understanding. Palliative  care has consulted, patient wishes to be full code, full scope, focused on recovery and return home.  Tele Pulmonology consulted.  Communicated with Dr. Chestine Spore a couple times.  Her input much appreciated.  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  She recommends continued prednisone 40 mg daily until outpatient pulmonology follow-up with pulmonology, Bactrim PJP prophylaxis Monday Wednesday Friday, outpatient pulmonology follow-up for consideration of antifibrotic agents (which cannot be started as inpatient).  D-dimer was elevated, followed up with CTA chest which was negative for PE but showed extensive predominantly dependent bibasilar fibrosis, bronchiectasis and consolidation unchanged from imaging from 8/16. Patient has appointment to see Dr. Sherene Sires, pulmonology on 09/14/2023 at 3 PM followed by Dr. Isaiah Serge or Dr. Marchelle Gearing on 10/06/2023 at 11:15 AM for post-COVID fibrosis. -Currently awaiting formal home O2 evaluation, will discharge home on oxygen.  Patient is slowly improving.  Today requiring 2 L/min of oxygen via nasal cannula.   ABG does not qualify him for home NIPPV. -Dr. Karie Fetch has discussed with patient's wife and explained to her that patient will not qualify for home NIPPV.   Hyperkalemia/hypokalemia: Potassium has normalized.   AKI Had creatinine of 1.51 on 8/19. Resolved   Normocytic anemia Stable   Thrombocytopenia Stable   Physical deconditioning: Therapies have evaluated and recommend home health PT and OT.  Biggest barrier currently is exertional dyspnea and hypoxia on minimal exertion. Has a trapeze bar in bed to facilitate repositioning and physical activity which she has been using.   Chronic pain syndrome Continue home regimen and avoid escalating  opioids especially given his respiratory status   GERD: PPI   Constipation Continue bowel regimen.   Failed Dulcolax suppository. Had a large satisfactory BM after milk and molasses enema on 8/25.   Acute urinary retention: -Resolved Had in and out catheterization yielding 900 mL urine.     Adult failure to thrive Palliative care consultation input appreciated.         Consultants: Pulmonology Procedures performed:  Disposition: Home Diet recommendation:  Discharge Diet Orders (From admission, onward)     Start     Ordered   08/26/23 0000  Diet - low sodium heart healthy        08/26/23 1224           Regular diet DISCHARGE MEDICATION: Allergies as of 08/26/2023       Reactions   Crestor [rosuvastatin]    Paroxetine Rash        Medication List     TAKE these medications    aspirin EC 81 MG tablet Take 81 mg by mouth daily.   clopidogrel 75 MG tablet Commonly known as: PLAVIX Take 75 mg by mouth daily. 1 Tablet Daily   docusate sodium 100 MG capsule Commonly known as: COLACE Take 1 capsule (100 mg total) by mouth 2 (two) times daily.   ipratropium-albuterol 0.5-2.5 (3) MG/3ML Soln Commonly known as: DUONEB Take 3 mLs by nebulization 3 (three) times daily.   mupirocin ointment 2 % Commonly known as: BACTROBAN Apply 1 Application topically 3 (three) times daily.   nitroGLYCERIN 0.4 MG SL tablet Commonly known as: NITROSTAT Place 0.4 mg under the tongue every 5 (five) minutes as needed.   oxycodone 30 MG immediate release tablet Commonly known as: ROXICODONE Take 30 mg by mouth every 6 (six) hours.   polyethylene glycol 17 g packet Commonly known as: MIRALAX / GLYCOLAX Take 17 g by mouth daily. Start taking on: August 27, 2023   predniSONE 20 MG tablet Commonly known as: DELTASONE Take 2 tablets (40 mg total) by mouth daily with breakfast. Start taking on: August 27, 2023   senna-docusate 8.6-50 MG tablet Commonly known as: Senokot-S Take 1 tablet by mouth at bedtime as needed for mild constipation.   sulfamethoxazole-trimethoprim  800-160 MG tablet Commonly known as: BACTRIM DS Take 1 tablet by mouth 3 (three) times a week. Start taking on: August 27, 2023   VITAMIN B-12 IJ Inject 1 mL as directed every 30 (thirty) days.   Vitamin D High Potency 25 MCG (1000 UT) capsule Generic drug: Cholecalciferol Take 5,000 Units by mouth at bedtime.               Durable Medical Equipment  (From admission, onward)           Start     Ordered   08/24/23 1518  For home use only DME Other see comment  Once       Comments: Free Standing Trapeze Bar  Question:  Length of Need  Answer:  6 Months   08/24/23 1518   08/16/23 1323  For home use only DME high strength lightweight manual wheelchair with seat cushion  Once       Comments: Patient suffers from physical deconditioning which impairs their ability to perform daily activities like ambulation in the home.  A cane will not resolve  issue with performing activities of daily living. A wheelchair will allow patient to safely perform daily activities.Length of need 12 months . (THEN ONE OF THESE TWO:) Patient self-propels the  wheelchair while engaging in frequent activities such as toileting which cannot be performed in a standard wheelchair due to the weight of the chair. Accessories: elevating leg rests (ELRs), wheel locks, extensions and anti-tippers.   08/16/23 1324            Discharge Exam: Filed Weights   08/13/23 1613 08/13/23 2113 08/14/23 0433  Weight: 76.2 kg 69.2 kg 70.8 kg   General-appears in no acute distress Heart-S1-S2, regular, no murmur auscultated Lungs-clear to auscultation bilaterally, no wheezing or crackles auscultated Abdomen-soft, nontender, no organomegaly Extremities-no edema in the lower extremities Neuro-alert, oriented x3, no focal deficit noted  Condition at discharge: good  The results of significant diagnostics from this hospitalization (including imaging, microbiology, ancillary and laboratory) are listed below for  reference.   Imaging Studies: CT Angio Chest Pulmonary Embolism (PE) W or WO Contrast  Result Date: 08/24/2023 CLINICAL DATA:  PE suspected, shortness of breath and hypoxia, positive D-dimer EXAM: CT ANGIOGRAPHY CHEST WITH CONTRAST TECHNIQUE: Multidetector CT imaging of the chest was performed using the standard protocol during bolus administration of intravenous contrast. Multiplanar CT image reconstructions and MIPs were obtained to evaluate the vascular anatomy. RADIATION DOSE REDUCTION: This exam was performed according to the departmental dose-optimization program which includes automated exposure control, adjustment of the mA and/or kV according to patient size and/or use of iterative reconstruction technique. CONTRAST:  75mL OMNIPAQUE IOHEXOL 350 MG/ML SOLN COMPARISON:  08/13/2023 FINDINGS: Cardiovascular: Satisfactory opacification of the pulmonary arteries to the segmental level. No evidence of pulmonary embolism. Cardiomegaly. Left and right coronary artery calcifications. No pericardial effusion. Aortic atherosclerosis. Mediastinum/Nodes: No enlarged mediastinal, hilar, or axillary lymph nodes. Thyroid gland, trachea, and esophagus demonstrate no significant findings. Lungs/Pleura: Examination is somewhat limited by breath motion artifact. Extensive, predominantly dependent bibasilar fibrosis, bronchiectasis, and consolidation (series 6, image 97). No pleural effusion or pneumothorax. Upper Abdomen: No acute abnormality. Large burden of stool in the included colon. Musculoskeletal: No chest wall abnormality. No acute osseous findings. Review of the MIP images confirms the above findings. IMPRESSION: 1. Negative examination for pulmonary embolism. 2. Extensive, predominantly dependent bibasilar fibrosis, bronchiectasis, and consolidation unchanged when compared to recent CT dated 08/13/2023 although new in comparison to prior examination dated 09/07/2019. This most likely reflects some combination of  acute airspace disease and chronic, fibrotic sequelae of prior infection or aspiration. 3. Cardiomegaly and coronary artery disease. 4. Large burden of stool. Aortic Atherosclerosis (ICD10-I70.0). Electronically Signed   By: Jearld Lesch M.D.   On: 08/24/2023 13:02   DG CHEST PORT 1 VIEW  Result Date: 08/23/2023 CLINICAL DATA:  Hypoxia EXAM: PORTABLE CHEST 1 VIEW COMPARISON:  08/13/2023 FINDINGS: Cardiac shadow is enlarged but stable. Aortic calcifications are seen. Increasing bibasilar infiltrates are noted superimposed over fibrotic changes. Old rib fractures are noted on the left. No acute bony abnormality is noted. IMPRESSION: Increasing bibasilar infiltrates. Electronically Signed   By: Alcide Clever M.D.   On: 08/23/2023 19:04   CT CHEST WO CONTRAST  Result Date: 08/13/2023 CLINICAL DATA:  Worsening hypoxia, recent COVID infection. Evaluate for COVID lung versus bacterial pneumonia. EXAM: CT CHEST WITHOUT CONTRAST TECHNIQUE: Multidetector CT imaging of the chest was performed following the standard protocol without IV contrast. RADIATION DOSE REDUCTION: This exam was performed according to the departmental dose-optimization program which includes automated exposure control, adjustment of the mA and/or kV according to patient size and/or use of iterative reconstruction technique. COMPARISON:  Chest radiograph 08/13/2023; report from CT chest abdomen and pelvis 09/07/2019 (images  unavailable). FINDINGS: Cardiovascular: Coronary artery and aortic atherosclerotic calcification. Trace pericardial effusion. Mediastinum/Nodes: Trace layering mucus or secretions in the posterior trachea. Unremarkable esophagus. Subcentimeter mediastinal nodes are favored reactive. Lungs/Pleura: Extensive reticular opacities and consolidation with architectural distortion and bronchiectasis greatest in the lower lobes with a predominantly peribronchovascular pattern. Additional scattered peripheral subpleural reticular and  ground-glass opacities. No pleural effusion or pneumothorax. Pleural calcifications along the anterior right upper lobe. Upper Abdomen: Cholecystectomy.  No acute abnormality. Musculoskeletal: Demineralization. Remote left rib fractures. No acute rib fracture. Presumed chronic compression fracture of T11 though this is not seen on any prior available comparisons. Compression fracture of T12 status post vertebroplasty. IMPRESSION: 1. Extensive reticular opacities and consolidation with architectural distortion and bronchiectasis greatest in the lower lobes with a predominantly peribronchovascular pattern. 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 2. Layering secretions or mucus in the posterior trachea. 3. Presumed chronic compression fracture of T11 though technically age indeterminate as this is not seen on comparison imaging. Aortic Atherosclerosis (ICD10-I70.0). Electronically Signed   By: Minerva Fester M.D.   On: 08/13/2023 19:28   DG Chest Port 1 View  Result Date: 08/13/2023 CLINICAL DATA:  Shortness of breath. EXAM: PORTABLE CHEST 1 VIEW COMPARISON:  07/31/2023 FINDINGS: Insert upper Interstitial markings are diffusely coarsened with chronic features. Persistent basilar airspace opacity compatible with atelectasis or infiltrate. No substantial pleural effusion. No pulmonary edema. Bones are diffusely demineralized. Telemetry leads overlie the chest. IMPRESSION: Chronic interstitial coarsening with persistent bibasilar airspace disease. Electronically Signed   By: Kennith Center M.D.   On: 08/13/2023 16:35   DG CHEST PORT 1 VIEW  Result Date: 07/31/2023 CLINICAL DATA:  Acute respiratory failure with hypoxia. EXAM: PORTABLE CHEST 1 VIEW COMPARISON:  07/29/2023. FINDINGS: The heart size and mediastinal contours are within normal limits. There is atherosclerotic calcification of the aorta. Mild airspace disease is present at the lung bases bilaterally. No effusion or  pneumothorax. Cervical spinal fusion hardware is noted. No acute osseous abnormality. IMPRESSION: Increased airspace disease at the lung bases bilaterally, possible atelectasis or infiltrate. Electronically Signed   By: Thornell Sartorius M.D.   On: 07/31/2023 02:40   DG Chest Port 1 View  Result Date: 07/29/2023 CLINICAL DATA:  [id number] Fever [id number] EXAM: PORTABLE CHEST 1 VIEW COMPARISON:  CT chest 09/07/2019, chest x-ray 06/28/2019 FINDINGS: The heart and mediastinal contours are unchanged. Aortic calcification. Retrocardiac airspace opacity. Chronic coarsened interstitial markings with no overt pulmonary edema. No pleural effusion. No pneumothorax. No acute osseous abnormality. IMPRESSION: 1. Retrocardiac airspace opacity that may represent a combination of atelectasis and infection/inflammation. Followup PA and lateral chest X-ray is recommended in 3-4 weeks following therapy to ensure resolution. 2.  Aortic Atherosclerosis (ICD10-I70.0). Electronically Signed   By: Tish Frederickson M.D.   On: 07/29/2023 19:02    Microbiology: Results for orders placed or performed during the hospital encounter of 07/29/23  Resp panel by RT-PCR (RSV, Flu A&B, Covid) Anterior Nasal Swab     Status: Abnormal   Collection Time: 07/29/23  6:16 PM   Specimen: Anterior Nasal Swab  Result Value Ref Range Status   SARS Coronavirus 2 by RT PCR POSITIVE (A) NEGATIVE Final    Comment: (NOTE) SARS-CoV-2 target nucleic acids are DETECTED.  The SARS-CoV-2 RNA is generally detectable in upper respiratory specimens during the acute phase of infection. Positive results are indicative of the presence of the identified virus, but do not rule out bacterial infection or co-infection with other pathogens not  detected by the test. Clinical correlation with patient history and other diagnostic information is necessary to determine patient infection status. The expected result is Negative.  Fact Sheet for  Patients: BloggerCourse.com  Fact Sheet for Healthcare Providers: SeriousBroker.it  This test is not yet approved or cleared by the [place] FDA and  has been authorized for detection and/or diagnosis of SARS-CoV-2 by FDA under an Emergency Use Authorization (EUA).  This EUA will remain in effect (meaning this test can be used) for the duration of  the COVID-19 declaration under Section 564(b)(1) of the A ct, 21 U.S.C. section 360bbb-3(b)(1), unless the authorization is terminated or revoked sooner.     Influenza A by PCR NEGATIVE NEGATIVE Final   Influenza B by PCR NEGATIVE NEGATIVE Final    Comment: (NOTE) The Xpert Xpress SARS-CoV-2/FLU/RSV plus assay is intended as an aid in the diagnosis of influenza from Nasopharyngeal swab specimens and should not be used as a sole basis for treatment. Nasal washings and aspirates are unacceptable for Xpert Xpress SARS-CoV-2/FLU/RSV testing.  Fact Sheet for Patients: BloggerCourse.com  Fact Sheet for Healthcare Providers: SeriousBroker.it  This test is not yet approved or cleared by the [place] FDA and has been authorized for detection and/or diagnosis of SARS-CoV-2 by FDA under an Emergency Use Authorization (EUA). This EUA will remain in effect (meaning this test can be used) for the duration of the COVID-19 declaration under Section 564(b)(1) of the Act, 21 U.S.C. section 360bbb-3(b)(1), unless the authorization is terminated or revoked.     Resp Syncytial Virus by PCR NEGATIVE NEGATIVE Final    Comment: (NOTE) Fact Sheet for Patients: BloggerCourse.com  Fact Sheet for Healthcare Providers: SeriousBroker.it  This test is not yet approved or cleared by the [place] FDA and has been authorized for detection and/or diagnosis of SARS-CoV-2 by FDA under an Emergency Use  Authorization (EUA). This EUA will remain in effect (meaning this test can be used) for the duration of the COVID-19 declaration under Section 564(b)(1) of the Act, 21 U.S.C. section 360bbb-3(b)(1), unless the authorization is terminated or revoked.  Performed at [facility], [address] [place], [place] [address]   Blood Culture (routine x 2)     Status: None   Collection Time: 07/29/23  6:25 PM   Specimen: Left Antecubital; Blood  Result Value Ref Range Status   Specimen Description LEFT ANTECUBITAL  Final   Special Requests   Final    BOTTLES DRAWN AEROBIC ONLY Blood Culture results may not be optimal due to an inadequate volume of blood received in culture bottles   Culture   Final    NO GROWTH 6 DAYS Performed at [facility], [address] [place], [place] [address]    Report Status 08/04/2023 FINAL  Final  Blood Culture (routine x 2)     Status: None   Collection Time: 07/29/23  6:32 PM   Specimen: BLOOD LEFT FOREARM  Result Value Ref Range Status   Specimen Description BLOOD LEFT FOREARM  Final   Special Requests   Final    BOTTLES DRAWN AEROBIC AND ANAEROBIC Blood Culture adequate volume   Culture   Final    NO GROWTH 6 DAYS Performed at [facility], [address], [place], [place] [address]    Report Status 08/04/2023 FINAL  Final    Labs: CBC: Recent Labs  Lab 08/21/23 0613 08/23/23 0508  WBC 8.0 8.4  HGB 9.5* 9.4*  HCT 30.2* 30.0*  MCV 95.3 95.8  PLT  98* 110*   Basic Metabolic Panel: Recent Labs  Lab 08/21/23 0613 08/22/23 0444 08/23/23 0508  NA 135 134* 138  K 5.3* 3.4* 4.9  CL 99 100 102  CO2 31 29 30   GLUCOSE 118* 106* 95  BUN 31* 30* 27*  CREATININE 1.16 1.15 1.09  CALCIUM 9.2 8.6* 8.8*   Liver Function Tests: No results for input(s): "AST", "ALT", "ALKPHOS", "BILITOT", "PROT", "ALBUMIN" in the last 168 hours. CBG: No results for input(s): "GLUCAP" in the last 168 hours.  Discharge time spent: greater than 30  minutes.  Signed: Meredeth Ide, MD [facility] 08/26/2023

## 2023-08-26 NOTE — Progress Notes (Signed)
Palliative: Travis Woodward is lying quietly in bed.  He appears chronically ill.  He greets me, making and somewhat keeping eye contact.  He is alert and oriented, able to make his needs known.  There is no family at bedside at this time.    We talk about plan for discharging home today.  Travis Woodward states that he is excited and looking forward to getting home.  We talked about pulmonary appointment September 17, this will be listed on his discharge paperwork.  No questions or concerns at this time.  Face-to-face conference with transition of care team related to patient condition, needs, goals of care.  Plan: Continue full scope/full code.  Follow-up with pulmonology outpatient 9/17 for pulmonary fibrosis treatment plan.  Would benefit from outpatient palliative services but declines at this point.  35 minutes Lillia Carmel, NP Palliative medicine team Team phone [phone] Greater than 50% of this time was spent counseling and coordinating care related to the above assessment and plan.

## 2023-08-26 NOTE — Care Management Important Message (Signed)
Important Message  Patient Details  Name: Travis Woodward. MRN: [id number] Date of Birth: 30-Mar-1941   Medicare Important Message Given:  Yes     [name] 08/26/2023, 10:20 AM

## 2023-08-26 NOTE — Progress Notes (Signed)
IV's removed and discharge instructions reviewed with patient and daughter.  Follow up appts made and scripts sent to pharmacy.  Transported by WC with O2 to car.  Wife to drive home

## 2023-08-31 DIAGNOSIS — J9621 Acute and chronic respiratory failure with hypoxia: Secondary | ICD-10-CM | POA: Diagnosis not present

## 2023-08-31 DIAGNOSIS — I129 Hypertensive chronic kidney disease with stage 1 through stage 4 chronic kidney disease, or unspecified chronic kidney disease: Secondary | ICD-10-CM | POA: Diagnosis not present

## 2023-08-31 DIAGNOSIS — U099 Post covid-19 condition, unspecified: Secondary | ICD-10-CM | POA: Diagnosis not present

## 2023-08-31 DIAGNOSIS — J479 Bronchiectasis, uncomplicated: Secondary | ICD-10-CM | POA: Diagnosis not present

## 2023-08-31 DIAGNOSIS — I700 Atherosclerosis of aorta: Secondary | ICD-10-CM | POA: Diagnosis not present

## 2023-08-31 DIAGNOSIS — J8410 Pulmonary fibrosis, unspecified: Secondary | ICD-10-CM | POA: Diagnosis not present

## 2023-08-31 DIAGNOSIS — L89152 Pressure ulcer of sacral region, stage 2: Secondary | ICD-10-CM | POA: Diagnosis not present

## 2023-08-31 DIAGNOSIS — I2510 Atherosclerotic heart disease of native coronary artery without angina pectoris: Secondary | ICD-10-CM | POA: Diagnosis not present

## 2023-08-31 DIAGNOSIS — N189 Chronic kidney disease, unspecified: Secondary | ICD-10-CM | POA: Diagnosis not present

## 2023-09-03 DIAGNOSIS — M1991 Primary osteoarthritis, unspecified site: Secondary | ICD-10-CM | POA: Diagnosis not present

## 2023-09-03 DIAGNOSIS — J9601 Acute respiratory failure with hypoxia: Secondary | ICD-10-CM | POA: Diagnosis not present

## 2023-09-03 DIAGNOSIS — G894 Chronic pain syndrome: Secondary | ICD-10-CM | POA: Diagnosis not present

## 2023-09-03 DIAGNOSIS — J8417 Interstitial lung disease with progressive fibrotic phenotype in diseases classified elsewhere: Secondary | ICD-10-CM | POA: Diagnosis not present

## 2023-09-03 DIAGNOSIS — E538 Deficiency of other specified B group vitamins: Secondary | ICD-10-CM | POA: Diagnosis not present

## 2023-09-03 DIAGNOSIS — I10 Essential (primary) hypertension: Secondary | ICD-10-CM | POA: Diagnosis not present

## 2023-09-03 DIAGNOSIS — N183 Chronic kidney disease, stage 3 unspecified: Secondary | ICD-10-CM | POA: Diagnosis not present

## 2023-09-06 DIAGNOSIS — I700 Atherosclerosis of aorta: Secondary | ICD-10-CM | POA: Diagnosis not present

## 2023-09-06 DIAGNOSIS — J479 Bronchiectasis, uncomplicated: Secondary | ICD-10-CM | POA: Diagnosis not present

## 2023-09-06 DIAGNOSIS — I129 Hypertensive chronic kidney disease with stage 1 through stage 4 chronic kidney disease, or unspecified chronic kidney disease: Secondary | ICD-10-CM | POA: Diagnosis not present

## 2023-09-06 DIAGNOSIS — I2510 Atherosclerotic heart disease of native coronary artery without angina pectoris: Secondary | ICD-10-CM | POA: Diagnosis not present

## 2023-09-06 DIAGNOSIS — N189 Chronic kidney disease, unspecified: Secondary | ICD-10-CM | POA: Diagnosis not present

## 2023-09-06 DIAGNOSIS — L89152 Pressure ulcer of sacral region, stage 2: Secondary | ICD-10-CM | POA: Diagnosis not present

## 2023-09-06 DIAGNOSIS — J8410 Pulmonary fibrosis, unspecified: Secondary | ICD-10-CM | POA: Diagnosis not present

## 2023-09-06 DIAGNOSIS — J9621 Acute and chronic respiratory failure with hypoxia: Secondary | ICD-10-CM | POA: Diagnosis not present

## 2023-09-06 DIAGNOSIS — U099 Post covid-19 condition, unspecified: Secondary | ICD-10-CM | POA: Diagnosis not present

## 2023-09-07 DIAGNOSIS — J9621 Acute and chronic respiratory failure with hypoxia: Secondary | ICD-10-CM | POA: Diagnosis not present

## 2023-09-07 DIAGNOSIS — J479 Bronchiectasis, uncomplicated: Secondary | ICD-10-CM | POA: Diagnosis not present

## 2023-09-07 DIAGNOSIS — L89152 Pressure ulcer of sacral region, stage 2: Secondary | ICD-10-CM | POA: Diagnosis not present

## 2023-09-07 DIAGNOSIS — I129 Hypertensive chronic kidney disease with stage 1 through stage 4 chronic kidney disease, or unspecified chronic kidney disease: Secondary | ICD-10-CM | POA: Diagnosis not present

## 2023-09-07 DIAGNOSIS — I700 Atherosclerosis of aorta: Secondary | ICD-10-CM | POA: Diagnosis not present

## 2023-09-07 DIAGNOSIS — N189 Chronic kidney disease, unspecified: Secondary | ICD-10-CM | POA: Diagnosis not present

## 2023-09-07 DIAGNOSIS — I2510 Atherosclerotic heart disease of native coronary artery without angina pectoris: Secondary | ICD-10-CM | POA: Diagnosis not present

## 2023-09-07 DIAGNOSIS — J8410 Pulmonary fibrosis, unspecified: Secondary | ICD-10-CM | POA: Diagnosis not present

## 2023-09-07 DIAGNOSIS — U099 Post covid-19 condition, unspecified: Secondary | ICD-10-CM | POA: Diagnosis not present

## 2023-09-08 DIAGNOSIS — J8410 Pulmonary fibrosis, unspecified: Secondary | ICD-10-CM | POA: Diagnosis not present

## 2023-09-08 DIAGNOSIS — U099 Post covid-19 condition, unspecified: Secondary | ICD-10-CM | POA: Diagnosis not present

## 2023-09-08 DIAGNOSIS — I129 Hypertensive chronic kidney disease with stage 1 through stage 4 chronic kidney disease, or unspecified chronic kidney disease: Secondary | ICD-10-CM | POA: Diagnosis not present

## 2023-09-08 DIAGNOSIS — J9621 Acute and chronic respiratory failure with hypoxia: Secondary | ICD-10-CM | POA: Diagnosis not present

## 2023-09-08 DIAGNOSIS — I700 Atherosclerosis of aorta: Secondary | ICD-10-CM | POA: Diagnosis not present

## 2023-09-08 DIAGNOSIS — N189 Chronic kidney disease, unspecified: Secondary | ICD-10-CM | POA: Diagnosis not present

## 2023-09-08 DIAGNOSIS — J479 Bronchiectasis, uncomplicated: Secondary | ICD-10-CM | POA: Diagnosis not present

## 2023-09-08 DIAGNOSIS — I2510 Atherosclerotic heart disease of native coronary artery without angina pectoris: Secondary | ICD-10-CM | POA: Diagnosis not present

## 2023-09-08 DIAGNOSIS — L89152 Pressure ulcer of sacral region, stage 2: Secondary | ICD-10-CM | POA: Diagnosis not present

## 2023-09-10 DIAGNOSIS — J8410 Pulmonary fibrosis, unspecified: Secondary | ICD-10-CM | POA: Diagnosis not present

## 2023-09-10 DIAGNOSIS — L89152 Pressure ulcer of sacral region, stage 2: Secondary | ICD-10-CM | POA: Diagnosis not present

## 2023-09-10 DIAGNOSIS — I700 Atherosclerosis of aorta: Secondary | ICD-10-CM | POA: Diagnosis not present

## 2023-09-10 DIAGNOSIS — J479 Bronchiectasis, uncomplicated: Secondary | ICD-10-CM | POA: Diagnosis not present

## 2023-09-10 DIAGNOSIS — I129 Hypertensive chronic kidney disease with stage 1 through stage 4 chronic kidney disease, or unspecified chronic kidney disease: Secondary | ICD-10-CM | POA: Diagnosis not present

## 2023-09-10 DIAGNOSIS — I2510 Atherosclerotic heart disease of native coronary artery without angina pectoris: Secondary | ICD-10-CM | POA: Diagnosis not present

## 2023-09-10 DIAGNOSIS — J9621 Acute and chronic respiratory failure with hypoxia: Secondary | ICD-10-CM | POA: Diagnosis not present

## 2023-09-10 DIAGNOSIS — U099 Post covid-19 condition, unspecified: Secondary | ICD-10-CM | POA: Diagnosis not present

## 2023-09-10 DIAGNOSIS — N189 Chronic kidney disease, unspecified: Secondary | ICD-10-CM | POA: Diagnosis not present

## 2023-09-13 NOTE — Progress Notes (Deleted)
[name]., male    DOB: [birth year]    MRN: [id number]   Brief patient profile:  75  yowm  *** referred to pulmonary clinic in [place]  09/14/2023 by *** for ***     History of Present Illness  09/14/2023  Pulmonary/ 1st office eval/ Sherene Sires / [place] [facility]  No chief complaint on file.    Dyspnea:  *** Cough: *** Sleep: *** SABA use: *** 02: *** Lung cancer screen: ***   Outpatient Medications Prior to Visit  Medication Sig Dispense Refill   aspirin EC 81 MG tablet Take 81 mg by mouth daily.     Cholecalciferol (VITAMIN D HIGH POTENCY) 25 MCG (1000 UT) capsule Take 5,000 Units by mouth at bedtime.     clopidogrel (PLAVIX) 75 MG tablet Take 75 mg by mouth daily. 1 Tablet Daily     Cyanocobalamin (VITAMIN B-12 IJ) Inject 1 mL as directed every 30 (thirty) days.      docusate sodium (COLACE) 100 MG capsule Take 1 capsule (100 mg total) by mouth 2 (two) times daily. 30 capsule 0   ipratropium-albuterol (DUONEB) 0.5-2.5 (3) MG/3ML SOLN Take 3 mLs by nebulization 3 (three) times daily. 480 mL 2   mupirocin ointment (BACTROBAN) 2 % Apply 1 Application topically 3 (three) times daily.     nitroGLYCERIN (NITROSTAT) 0.4 MG SL tablet Place 0.4 mg under the tongue every 5 (five) minutes as needed.  (Patient not taking: Reported on 08/13/2023)     oxycodone (ROXICODONE) 30 MG immediate release tablet Take 30 mg by mouth every 6 (six) hours.     polyethylene glycol (MIRALAX / GLYCOLAX) 17 g packet Take 17 g by mouth daily. 14 each 0   predniSONE (DELTASONE) 20 MG tablet Take 2 tablets (40 mg total) by mouth daily with breakfast. 60 tablet 1   senna-docusate (SENOKOT-S) 8.6-50 MG tablet Take 1 tablet by mouth at bedtime as needed for mild constipation. 30 tablet 0   sulfamethoxazole-trimethoprim (BACTRIM DS) 800-160 MG tablet Take 1 tablet by mouth 3 (three) times a week. 30 tablet 1   No facility-administered medications prior to visit.    Past Medical History:  Diagnosis Date    Chronic kidney disease    stage 3   Colon polyps    Complication of anesthesia    difficulty urinating    Coronary artery disease    Enlarged prostate    Hyperlipidemia    Hypertension       Objective:     There were no vitals taken for this visit.         Assessment   No problem-specific Assessment & Plan notes found for this encounter.     Sandrea Hughs, MD 09/13/2023

## 2023-09-14 ENCOUNTER — Institutional Professional Consult (permissible substitution): Payer: Medicare HMO | Admitting: Internal Medicine

## 2023-09-14 DIAGNOSIS — J9621 Acute and chronic respiratory failure with hypoxia: Secondary | ICD-10-CM | POA: Diagnosis not present

## 2023-09-14 DIAGNOSIS — U099 Post covid-19 condition, unspecified: Secondary | ICD-10-CM | POA: Diagnosis not present

## 2023-09-14 DIAGNOSIS — L89152 Pressure ulcer of sacral region, stage 2: Secondary | ICD-10-CM | POA: Diagnosis not present

## 2023-09-14 DIAGNOSIS — J8410 Pulmonary fibrosis, unspecified: Secondary | ICD-10-CM | POA: Diagnosis not present

## 2023-09-15 DIAGNOSIS — L89152 Pressure ulcer of sacral region, stage 2: Secondary | ICD-10-CM | POA: Diagnosis not present

## 2023-09-15 DIAGNOSIS — I2510 Atherosclerotic heart disease of native coronary artery without angina pectoris: Secondary | ICD-10-CM | POA: Diagnosis not present

## 2023-09-15 DIAGNOSIS — J479 Bronchiectasis, uncomplicated: Secondary | ICD-10-CM | POA: Diagnosis not present

## 2023-09-15 DIAGNOSIS — I700 Atherosclerosis of aorta: Secondary | ICD-10-CM | POA: Diagnosis not present

## 2023-09-15 DIAGNOSIS — J8410 Pulmonary fibrosis, unspecified: Secondary | ICD-10-CM | POA: Diagnosis not present

## 2023-09-15 DIAGNOSIS — U099 Post covid-19 condition, unspecified: Secondary | ICD-10-CM | POA: Diagnosis not present

## 2023-09-15 DIAGNOSIS — I129 Hypertensive chronic kidney disease with stage 1 through stage 4 chronic kidney disease, or unspecified chronic kidney disease: Secondary | ICD-10-CM | POA: Diagnosis not present

## 2023-09-15 DIAGNOSIS — N189 Chronic kidney disease, unspecified: Secondary | ICD-10-CM | POA: Diagnosis not present

## 2023-09-15 DIAGNOSIS — J9621 Acute and chronic respiratory failure with hypoxia: Secondary | ICD-10-CM | POA: Diagnosis not present

## 2023-09-18 DIAGNOSIS — I129 Hypertensive chronic kidney disease with stage 1 through stage 4 chronic kidney disease, or unspecified chronic kidney disease: Secondary | ICD-10-CM | POA: Diagnosis not present

## 2023-09-18 DIAGNOSIS — N189 Chronic kidney disease, unspecified: Secondary | ICD-10-CM | POA: Diagnosis not present

## 2023-09-18 DIAGNOSIS — J8410 Pulmonary fibrosis, unspecified: Secondary | ICD-10-CM | POA: Diagnosis not present

## 2023-09-18 DIAGNOSIS — J479 Bronchiectasis, uncomplicated: Secondary | ICD-10-CM | POA: Diagnosis not present

## 2023-09-18 DIAGNOSIS — I700 Atherosclerosis of aorta: Secondary | ICD-10-CM | POA: Diagnosis not present

## 2023-09-18 DIAGNOSIS — J9621 Acute and chronic respiratory failure with hypoxia: Secondary | ICD-10-CM | POA: Diagnosis not present

## 2023-09-18 DIAGNOSIS — I2510 Atherosclerotic heart disease of native coronary artery without angina pectoris: Secondary | ICD-10-CM | POA: Diagnosis not present

## 2023-09-18 DIAGNOSIS — U099 Post covid-19 condition, unspecified: Secondary | ICD-10-CM | POA: Diagnosis not present

## 2023-09-18 DIAGNOSIS — L89152 Pressure ulcer of sacral region, stage 2: Secondary | ICD-10-CM | POA: Diagnosis not present

## 2023-09-21 DIAGNOSIS — J8410 Pulmonary fibrosis, unspecified: Secondary | ICD-10-CM | POA: Diagnosis not present

## 2023-09-21 DIAGNOSIS — I2510 Atherosclerotic heart disease of native coronary artery without angina pectoris: Secondary | ICD-10-CM | POA: Diagnosis not present

## 2023-09-21 DIAGNOSIS — I129 Hypertensive chronic kidney disease with stage 1 through stage 4 chronic kidney disease, or unspecified chronic kidney disease: Secondary | ICD-10-CM | POA: Diagnosis not present

## 2023-09-21 DIAGNOSIS — I700 Atherosclerosis of aorta: Secondary | ICD-10-CM | POA: Diagnosis not present

## 2023-09-21 DIAGNOSIS — J479 Bronchiectasis, uncomplicated: Secondary | ICD-10-CM | POA: Diagnosis not present

## 2023-09-21 DIAGNOSIS — N189 Chronic kidney disease, unspecified: Secondary | ICD-10-CM | POA: Diagnosis not present

## 2023-09-21 DIAGNOSIS — J9621 Acute and chronic respiratory failure with hypoxia: Secondary | ICD-10-CM | POA: Diagnosis not present

## 2023-09-21 DIAGNOSIS — U099 Post covid-19 condition, unspecified: Secondary | ICD-10-CM | POA: Diagnosis not present

## 2023-09-21 DIAGNOSIS — L89152 Pressure ulcer of sacral region, stage 2: Secondary | ICD-10-CM | POA: Diagnosis not present

## 2023-09-22 DIAGNOSIS — L89152 Pressure ulcer of sacral region, stage 2: Secondary | ICD-10-CM | POA: Diagnosis not present

## 2023-09-22 DIAGNOSIS — J479 Bronchiectasis, uncomplicated: Secondary | ICD-10-CM | POA: Diagnosis not present

## 2023-09-22 DIAGNOSIS — N189 Chronic kidney disease, unspecified: Secondary | ICD-10-CM | POA: Diagnosis not present

## 2023-09-22 DIAGNOSIS — I2510 Atherosclerotic heart disease of native coronary artery without angina pectoris: Secondary | ICD-10-CM | POA: Diagnosis not present

## 2023-09-22 DIAGNOSIS — I129 Hypertensive chronic kidney disease with stage 1 through stage 4 chronic kidney disease, or unspecified chronic kidney disease: Secondary | ICD-10-CM | POA: Diagnosis not present

## 2023-09-22 DIAGNOSIS — I700 Atherosclerosis of aorta: Secondary | ICD-10-CM | POA: Diagnosis not present

## 2023-09-22 DIAGNOSIS — U099 Post covid-19 condition, unspecified: Secondary | ICD-10-CM | POA: Diagnosis not present

## 2023-09-22 DIAGNOSIS — J9621 Acute and chronic respiratory failure with hypoxia: Secondary | ICD-10-CM | POA: Diagnosis not present

## 2023-09-22 DIAGNOSIS — J8410 Pulmonary fibrosis, unspecified: Secondary | ICD-10-CM | POA: Diagnosis not present

## 2023-09-24 DIAGNOSIS — J9621 Acute and chronic respiratory failure with hypoxia: Secondary | ICD-10-CM | POA: Diagnosis not present

## 2023-09-24 DIAGNOSIS — N189 Chronic kidney disease, unspecified: Secondary | ICD-10-CM | POA: Diagnosis not present

## 2023-09-24 DIAGNOSIS — U099 Post covid-19 condition, unspecified: Secondary | ICD-10-CM | POA: Diagnosis not present

## 2023-09-24 DIAGNOSIS — J8410 Pulmonary fibrosis, unspecified: Secondary | ICD-10-CM | POA: Diagnosis not present

## 2023-09-24 DIAGNOSIS — J479 Bronchiectasis, uncomplicated: Secondary | ICD-10-CM | POA: Diagnosis not present

## 2023-09-24 DIAGNOSIS — I129 Hypertensive chronic kidney disease with stage 1 through stage 4 chronic kidney disease, or unspecified chronic kidney disease: Secondary | ICD-10-CM | POA: Diagnosis not present

## 2023-09-24 DIAGNOSIS — I700 Atherosclerosis of aorta: Secondary | ICD-10-CM | POA: Diagnosis not present

## 2023-09-24 DIAGNOSIS — I2510 Atherosclerotic heart disease of native coronary artery without angina pectoris: Secondary | ICD-10-CM | POA: Diagnosis not present

## 2023-09-24 DIAGNOSIS — L89152 Pressure ulcer of sacral region, stage 2: Secondary | ICD-10-CM | POA: Diagnosis not present

## 2023-09-27 DIAGNOSIS — J8410 Pulmonary fibrosis, unspecified: Secondary | ICD-10-CM | POA: Diagnosis not present

## 2023-09-27 DIAGNOSIS — J9621 Acute and chronic respiratory failure with hypoxia: Secondary | ICD-10-CM | POA: Diagnosis not present

## 2023-09-27 DIAGNOSIS — I129 Hypertensive chronic kidney disease with stage 1 through stage 4 chronic kidney disease, or unspecified chronic kidney disease: Secondary | ICD-10-CM | POA: Diagnosis not present

## 2023-09-27 DIAGNOSIS — N189 Chronic kidney disease, unspecified: Secondary | ICD-10-CM | POA: Diagnosis not present

## 2023-09-27 DIAGNOSIS — I700 Atherosclerosis of aorta: Secondary | ICD-10-CM | POA: Diagnosis not present

## 2023-09-27 DIAGNOSIS — J479 Bronchiectasis, uncomplicated: Secondary | ICD-10-CM | POA: Diagnosis not present

## 2023-09-27 DIAGNOSIS — U099 Post covid-19 condition, unspecified: Secondary | ICD-10-CM | POA: Diagnosis not present

## 2023-09-27 DIAGNOSIS — I2510 Atherosclerotic heart disease of native coronary artery without angina pectoris: Secondary | ICD-10-CM | POA: Diagnosis not present

## 2023-09-27 DIAGNOSIS — L89152 Pressure ulcer of sacral region, stage 2: Secondary | ICD-10-CM | POA: Diagnosis not present

## 2023-09-29 DIAGNOSIS — J8410 Pulmonary fibrosis, unspecified: Secondary | ICD-10-CM | POA: Diagnosis not present

## 2023-09-29 DIAGNOSIS — G894 Chronic pain syndrome: Secondary | ICD-10-CM | POA: Diagnosis not present

## 2023-09-29 DIAGNOSIS — N183 Chronic kidney disease, stage 3 unspecified: Secondary | ICD-10-CM | POA: Diagnosis not present

## 2023-09-29 DIAGNOSIS — J9611 Chronic respiratory failure with hypoxia: Secondary | ICD-10-CM | POA: Diagnosis not present

## 2023-09-30 DIAGNOSIS — N189 Chronic kidney disease, unspecified: Secondary | ICD-10-CM | POA: Diagnosis not present

## 2023-09-30 DIAGNOSIS — U099 Post covid-19 condition, unspecified: Secondary | ICD-10-CM | POA: Diagnosis not present

## 2023-09-30 DIAGNOSIS — J479 Bronchiectasis, uncomplicated: Secondary | ICD-10-CM | POA: Diagnosis not present

## 2023-09-30 DIAGNOSIS — L89152 Pressure ulcer of sacral region, stage 2: Secondary | ICD-10-CM | POA: Diagnosis not present

## 2023-09-30 DIAGNOSIS — I700 Atherosclerosis of aorta: Secondary | ICD-10-CM | POA: Diagnosis not present

## 2023-09-30 DIAGNOSIS — I2510 Atherosclerotic heart disease of native coronary artery without angina pectoris: Secondary | ICD-10-CM | POA: Diagnosis not present

## 2023-09-30 DIAGNOSIS — J9621 Acute and chronic respiratory failure with hypoxia: Secondary | ICD-10-CM | POA: Diagnosis not present

## 2023-09-30 DIAGNOSIS — J8410 Pulmonary fibrosis, unspecified: Secondary | ICD-10-CM | POA: Diagnosis not present

## 2023-09-30 DIAGNOSIS — I129 Hypertensive chronic kidney disease with stage 1 through stage 4 chronic kidney disease, or unspecified chronic kidney disease: Secondary | ICD-10-CM | POA: Diagnosis not present

## 2023-10-04 DIAGNOSIS — N189 Chronic kidney disease, unspecified: Secondary | ICD-10-CM | POA: Diagnosis not present

## 2023-10-04 DIAGNOSIS — J479 Bronchiectasis, uncomplicated: Secondary | ICD-10-CM | POA: Diagnosis not present

## 2023-10-04 DIAGNOSIS — J9621 Acute and chronic respiratory failure with hypoxia: Secondary | ICD-10-CM | POA: Diagnosis not present

## 2023-10-04 DIAGNOSIS — L89152 Pressure ulcer of sacral region, stage 2: Secondary | ICD-10-CM | POA: Diagnosis not present

## 2023-10-04 DIAGNOSIS — I700 Atherosclerosis of aorta: Secondary | ICD-10-CM | POA: Diagnosis not present

## 2023-10-04 DIAGNOSIS — J8410 Pulmonary fibrosis, unspecified: Secondary | ICD-10-CM | POA: Diagnosis not present

## 2023-10-04 DIAGNOSIS — I2510 Atherosclerotic heart disease of native coronary artery without angina pectoris: Secondary | ICD-10-CM | POA: Diagnosis not present

## 2023-10-04 DIAGNOSIS — U099 Post covid-19 condition, unspecified: Secondary | ICD-10-CM | POA: Diagnosis not present

## 2023-10-04 DIAGNOSIS — I129 Hypertensive chronic kidney disease with stage 1 through stage 4 chronic kidney disease, or unspecified chronic kidney disease: Secondary | ICD-10-CM | POA: Diagnosis not present

## 2023-10-05 DIAGNOSIS — J9621 Acute and chronic respiratory failure with hypoxia: Secondary | ICD-10-CM | POA: Diagnosis not present

## 2023-10-05 DIAGNOSIS — L89152 Pressure ulcer of sacral region, stage 2: Secondary | ICD-10-CM | POA: Diagnosis not present

## 2023-10-05 DIAGNOSIS — I2510 Atherosclerotic heart disease of native coronary artery without angina pectoris: Secondary | ICD-10-CM | POA: Diagnosis not present

## 2023-10-05 DIAGNOSIS — U099 Post covid-19 condition, unspecified: Secondary | ICD-10-CM | POA: Diagnosis not present

## 2023-10-05 DIAGNOSIS — I129 Hypertensive chronic kidney disease with stage 1 through stage 4 chronic kidney disease, or unspecified chronic kidney disease: Secondary | ICD-10-CM | POA: Diagnosis not present

## 2023-10-05 DIAGNOSIS — N189 Chronic kidney disease, unspecified: Secondary | ICD-10-CM | POA: Diagnosis not present

## 2023-10-05 DIAGNOSIS — J8410 Pulmonary fibrosis, unspecified: Secondary | ICD-10-CM | POA: Diagnosis not present

## 2023-10-05 DIAGNOSIS — J479 Bronchiectasis, uncomplicated: Secondary | ICD-10-CM | POA: Diagnosis not present

## 2023-10-05 DIAGNOSIS — I700 Atherosclerosis of aorta: Secondary | ICD-10-CM | POA: Diagnosis not present

## 2023-10-06 ENCOUNTER — Ambulatory Visit (INDEPENDENT_AMBULATORY_CARE_PROVIDER_SITE_OTHER): Payer: Medicare HMO | Admitting: Pulmonary Disease

## 2023-10-06 ENCOUNTER — Encounter: Payer: Self-pay | Admitting: Pulmonary Disease

## 2023-10-06 VITALS — BP 135/69 | HR 101 | Ht 74.0 in | Wt 165.0 lb

## 2023-10-06 DIAGNOSIS — J84112 Idiopathic pulmonary fibrosis: Secondary | ICD-10-CM | POA: Diagnosis not present

## 2023-10-06 DIAGNOSIS — U099 Post covid-19 condition, unspecified: Secondary | ICD-10-CM

## 2023-10-06 MED ORDER — PREDNISONE 5 MG PO TABS: 5.0000 mg | ORAL_TABLET | Freq: Every day | ORAL | 1 refills | Status: DC
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
## 2023-10-08 LAB — ANA,IFA RA DIAG PNL W/RFLX TIT/PATN
Anti Nuclear Antibody (ANA): NEGATIVE
Cyclic Citrullin Peptide Ab: <16 U
Rheumatoid fact SerPl-aCnc: <10 [IU]/mL (ref <14)

## 2023-10-08 LAB — ANTI-DNA ANTIBODY, DOUBLE-STRANDED: ds DNA Ab: <1 [IU]/mL

## 2023-10-10 ENCOUNTER — Encounter (HOSPITAL_COMMUNITY): Payer: Self-pay

## 2023-10-10 ENCOUNTER — Emergency Department (HOSPITAL_COMMUNITY)
Admission: EM | Admit: 2023-10-10 | Discharge: 2023-10-11 | Disposition: A | Discharge status: Home / Self Care | Payer: Medicare HMO | Source: Home / Self Care | Attending: Emergency Medicine | Admitting: Emergency Medicine

## 2023-10-10 ENCOUNTER — Other Ambulatory Visit: Payer: Self-pay

## 2023-10-10 ENCOUNTER — Emergency Department (HOSPITAL_COMMUNITY): Payer: Medicare HMO

## 2023-10-10 DIAGNOSIS — I82461 Acute embolism and thrombosis of right calf muscular vein: Secondary | ICD-10-CM | POA: Diagnosis present

## 2023-10-10 DIAGNOSIS — I700 Atherosclerosis of aorta: Secondary | ICD-10-CM | POA: Diagnosis not present

## 2023-10-10 DIAGNOSIS — J9611 Chronic respiratory failure with hypoxia: Secondary | ICD-10-CM | POA: Diagnosis present

## 2023-10-10 DIAGNOSIS — R339 Retention of urine, unspecified: Secondary | ICD-10-CM | POA: Insufficient documentation

## 2023-10-10 DIAGNOSIS — Z79899 Other long term (current) drug therapy: Secondary | ICD-10-CM | POA: Insufficient documentation

## 2023-10-10 DIAGNOSIS — I82491 Acute embolism and thrombosis of other specified deep vein of right lower extremity: Secondary | ICD-10-CM | POA: Diagnosis not present

## 2023-10-10 DIAGNOSIS — N189 Chronic kidney disease, unspecified: Secondary | ICD-10-CM | POA: Insufficient documentation

## 2023-10-10 DIAGNOSIS — U099 Post covid-19 condition, unspecified: Secondary | ICD-10-CM | POA: Diagnosis present

## 2023-10-10 DIAGNOSIS — L89152 Pressure ulcer of sacral region, stage 2: Secondary | ICD-10-CM | POA: Diagnosis not present

## 2023-10-10 DIAGNOSIS — Z87891 Personal history of nicotine dependence: Secondary | ICD-10-CM | POA: Diagnosis not present

## 2023-10-10 DIAGNOSIS — Z955 Presence of coronary angioplasty implant and graft: Secondary | ICD-10-CM | POA: Diagnosis not present

## 2023-10-10 DIAGNOSIS — Z8249 Family history of ischemic heart disease and other diseases of the circulatory system: Secondary | ICD-10-CM | POA: Diagnosis not present

## 2023-10-10 DIAGNOSIS — Z7982 Long term (current) use of aspirin: Secondary | ICD-10-CM | POA: Insufficient documentation

## 2023-10-10 DIAGNOSIS — I2510 Atherosclerotic heart disease of native coronary artery without angina pectoris: Secondary | ICD-10-CM | POA: Insufficient documentation

## 2023-10-10 DIAGNOSIS — I82413 Acute embolism and thrombosis of femoral vein, bilateral: Secondary | ICD-10-CM | POA: Diagnosis present

## 2023-10-10 DIAGNOSIS — D649 Anemia, unspecified: Secondary | ICD-10-CM | POA: Diagnosis present

## 2023-10-10 DIAGNOSIS — E785 Hyperlipidemia, unspecified: Secondary | ICD-10-CM | POA: Diagnosis present

## 2023-10-10 DIAGNOSIS — R600 Localized edema: Secondary | ICD-10-CM

## 2023-10-10 DIAGNOSIS — Z9861 Coronary angioplasty status: Secondary | ICD-10-CM | POA: Diagnosis not present

## 2023-10-10 DIAGNOSIS — I82431 Acute embolism and thrombosis of right popliteal vein: Secondary | ICD-10-CM | POA: Diagnosis present

## 2023-10-10 DIAGNOSIS — Z7901 Long term (current) use of anticoagulants: Secondary | ICD-10-CM | POA: Diagnosis not present

## 2023-10-10 DIAGNOSIS — N401 Enlarged prostate with lower urinary tract symptoms: Secondary | ICD-10-CM | POA: Diagnosis present

## 2023-10-10 DIAGNOSIS — I82409 Acute embolism and thrombosis of unspecified deep veins of unspecified lower extremity: Secondary | ICD-10-CM | POA: Diagnosis present

## 2023-10-10 DIAGNOSIS — J984 Other disorders of lung: Secondary | ICD-10-CM | POA: Diagnosis not present

## 2023-10-10 DIAGNOSIS — I82412 Acute embolism and thrombosis of left femoral vein: Secondary | ICD-10-CM | POA: Diagnosis not present

## 2023-10-10 DIAGNOSIS — M7989 Other specified soft tissue disorders: Secondary | ICD-10-CM | POA: Diagnosis not present

## 2023-10-10 DIAGNOSIS — R338 Other retention of urine: Secondary | ICD-10-CM | POA: Diagnosis present

## 2023-10-10 DIAGNOSIS — D696 Thrombocytopenia, unspecified: Secondary | ICD-10-CM | POA: Diagnosis present

## 2023-10-10 DIAGNOSIS — I82403 Acute embolism and thrombosis of unspecified deep veins of lower extremity, bilateral: Secondary | ICD-10-CM | POA: Diagnosis not present

## 2023-10-10 DIAGNOSIS — Z8673 Personal history of transient ischemic attack (TIA), and cerebral infarction without residual deficits: Secondary | ICD-10-CM | POA: Diagnosis not present

## 2023-10-10 DIAGNOSIS — J479 Bronchiectasis, uncomplicated: Secondary | ICD-10-CM | POA: Diagnosis not present

## 2023-10-10 DIAGNOSIS — R9431 Abnormal electrocardiogram [ECG] [EKG]: Secondary | ICD-10-CM | POA: Diagnosis not present

## 2023-10-10 DIAGNOSIS — I82493 Acute embolism and thrombosis of other specified deep vein of lower extremity, bilateral: Secondary | ICD-10-CM | POA: Diagnosis not present

## 2023-10-10 DIAGNOSIS — I129 Hypertensive chronic kidney disease with stage 1 through stage 4 chronic kidney disease, or unspecified chronic kidney disease: Secondary | ICD-10-CM | POA: Insufficient documentation

## 2023-10-10 DIAGNOSIS — J8410 Pulmonary fibrosis, unspecified: Secondary | ICD-10-CM | POA: Diagnosis present

## 2023-10-10 DIAGNOSIS — R110 Nausea: Secondary | ICD-10-CM | POA: Diagnosis not present

## 2023-10-10 DIAGNOSIS — I82411 Acute embolism and thrombosis of right femoral vein: Secondary | ICD-10-CM | POA: Diagnosis not present

## 2023-10-10 DIAGNOSIS — Z9981 Dependence on supplemental oxygen: Secondary | ICD-10-CM | POA: Diagnosis not present

## 2023-10-10 DIAGNOSIS — I10 Essential (primary) hypertension: Secondary | ICD-10-CM | POA: Diagnosis not present

## 2023-10-10 DIAGNOSIS — J9621 Acute and chronic respiratory failure with hypoxia: Secondary | ICD-10-CM | POA: Diagnosis not present

## 2023-10-10 DIAGNOSIS — R000 Tachycardia, unspecified: Secondary | ICD-10-CM | POA: Diagnosis not present

## 2023-10-10 DIAGNOSIS — E8809 Other disorders of plasma-protein metabolism, not elsewhere classified: Secondary | ICD-10-CM | POA: Diagnosis present

## 2023-10-10 DIAGNOSIS — N1831 Chronic kidney disease, stage 3a: Secondary | ICD-10-CM | POA: Diagnosis present

## 2023-10-10 DIAGNOSIS — I82401 Acute embolism and thrombosis of unspecified deep veins of right lower extremity: Secondary | ICD-10-CM | POA: Diagnosis not present

## 2023-10-10 DIAGNOSIS — Z862 Personal history of diseases of the blood and blood-forming organs and certain disorders involving the immune mechanism: Secondary | ICD-10-CM | POA: Diagnosis not present

## 2023-10-10 DIAGNOSIS — K219 Gastro-esophageal reflux disease without esophagitis: Secondary | ICD-10-CM | POA: Diagnosis present

## 2023-10-10 DIAGNOSIS — I130 Hypertensive heart and chronic kidney disease with heart failure and stage 1 through stage 4 chronic kidney disease, or unspecified chronic kidney disease: Secondary | ICD-10-CM | POA: Diagnosis present

## 2023-10-10 DIAGNOSIS — I5032 Chronic diastolic (congestive) heart failure: Secondary | ICD-10-CM | POA: Diagnosis present

## 2023-10-10 LAB — COMPREHENSIVE METABOLIC PANEL
ALT: 21 U/L (ref 0–44)
AST: 14 U/L — ABNORMAL LOW (ref 15–41)
Albumin: 3.3 g/dL — ABNORMAL LOW (ref 3.5–5.0)
Alkaline Phosphatase: 75 U/L (ref 38–126)
Anion gap: 11 (ref 5–15)
BUN: 21 mg/dL (ref 8–23)
CO2: 26 mmol/L (ref 22–32)
Calcium: 8.7 mg/dL — ABNORMAL LOW (ref 8.9–10.3)
Chloride: 94 mmol/L — ABNORMAL LOW (ref 98–111)
Creatinine, Ser: 1.14 mg/dL (ref 0.61–1.24)
GFR, Estimated: >60 mL/min (ref >60)
Glucose, Bld: 124 mg/dL — ABNORMAL HIGH (ref 70–99)
Potassium: 4 mmol/L (ref 3.5–5.1)
Sodium: 131 mmol/L — ABNORMAL LOW (ref 135–145)
Total Bilirubin: 0.7 mg/dL (ref 0.3–1.2)
Total Protein: 5.9 g/dL — ABNORMAL LOW (ref 6.5–8.1)

## 2023-10-10 LAB — CBC WITH DIFFERENTIAL/PLATELET
Abs Immature Granulocytes: 0.15 10*3/uL — ABNORMAL HIGH (ref 0.00–0.07)
Basophils Absolute: 0.1 10*3/uL (ref 0.0–0.1)
Basophils Relative: 0 %
Eosinophils Absolute: 0.2 10*3/uL (ref 0.0–0.5)
Eosinophils Relative: 1 %
HCT: 37.4 % — ABNORMAL LOW (ref 39.0–52.0)
Hemoglobin: 11.7 g/dL — ABNORMAL LOW (ref 13.0–17.0)
Immature Granulocytes: 1 %
Lymphocytes Relative: 18 %
Lymphs Abs: 2.4 10*3/uL (ref 0.7–4.0)
MCH: 31.4 pg (ref 26.0–34.0)
MCHC: 31.3 g/dL (ref 30.0–36.0)
MCV: 100.3 fL — ABNORMAL HIGH (ref 80.0–100.0)
Monocytes Absolute: 0.9 10*3/uL (ref 0.1–1.0)
Monocytes Relative: 7 %
Neutro Abs: 9.7 10*3/uL — ABNORMAL HIGH (ref 1.7–7.7)
Neutrophils Relative %: 73 %
Platelets: 125 10*3/uL — ABNORMAL LOW (ref 150–400)
RBC: 3.73 MIL/uL — ABNORMAL LOW (ref 4.22–5.81)
RDW: 18.6 % — ABNORMAL HIGH (ref 11.5–15.5)
WBC: 13.4 10*3/uL — ABNORMAL HIGH (ref 4.0–10.5)
nRBC: 0 % (ref 0.0–0.2)

## 2023-10-10 LAB — BRAIN NATRIURETIC PEPTIDE: B Natriuretic Peptide: 82 pg/mL (ref 0.0–100.0)

## 2023-10-10 NOTE — ED Triage Notes (Signed)
Pt complains of urine retention x2 hours Stated when he went 2 hours ago he didn't feel like his bladder fully emptied  Pt wears 2LPM O2 at all time Also complains of increased B/L LE swelling

## 2023-10-11 DIAGNOSIS — J479 Bronchiectasis, uncomplicated: Secondary | ICD-10-CM | POA: Diagnosis not present

## 2023-10-11 DIAGNOSIS — L89152 Pressure ulcer of sacral region, stage 2: Secondary | ICD-10-CM | POA: Diagnosis not present

## 2023-10-11 DIAGNOSIS — J8410 Pulmonary fibrosis, unspecified: Secondary | ICD-10-CM | POA: Diagnosis not present

## 2023-10-11 DIAGNOSIS — J9621 Acute and chronic respiratory failure with hypoxia: Secondary | ICD-10-CM | POA: Diagnosis not present

## 2023-10-11 DIAGNOSIS — I2510 Atherosclerotic heart disease of native coronary artery without angina pectoris: Secondary | ICD-10-CM | POA: Diagnosis not present

## 2023-10-11 DIAGNOSIS — I700 Atherosclerosis of aorta: Secondary | ICD-10-CM | POA: Diagnosis not present

## 2023-10-11 DIAGNOSIS — I129 Hypertensive chronic kidney disease with stage 1 through stage 4 chronic kidney disease, or unspecified chronic kidney disease: Secondary | ICD-10-CM | POA: Diagnosis not present

## 2023-10-11 DIAGNOSIS — U099 Post covid-19 condition, unspecified: Secondary | ICD-10-CM | POA: Diagnosis not present

## 2023-10-11 DIAGNOSIS — N189 Chronic kidney disease, unspecified: Secondary | ICD-10-CM | POA: Diagnosis not present

## 2023-10-11 LAB — URINALYSIS, ROUTINE W REFLEX MICROSCOPIC
Bilirubin Urine: NEGATIVE
Glucose, UA: NEGATIVE mg/dL
Hgb urine dipstick: NEGATIVE
Ketones, ur: NEGATIVE mg/dL
Leukocytes,Ua: NEGATIVE
Nitrite: NEGATIVE
Protein, ur: NEGATIVE mg/dL
Specific Gravity, Urine: 1.006 (ref 1.005–1.030)
pH: 7 (ref 5.0–8.0)

## 2023-10-11 NOTE — Discharge Instructions (Signed)
Show up to the ED waiting room later this morning, after 8 AM, to undergo an ultrasound study to look for blood clots in your legs.  Keep Foley catheter in place until you see urologist.  Call the telephone number below to set up that follow-up appointment.  Return to the emergency department for any new or worsening symptoms of concern.

## 2023-10-11 NOTE — ED Notes (Signed)
Leg bag applied to the patients foley. Return demonstration for emptying performed. Reviewed D/C information with the patient, pt verbalized understanding. No additional concerns at this time.

## 2023-10-11 NOTE — ED Provider Notes (Signed)
[place] EMERGENCY DEPARTMENT AT [facility] Provider Note   CSN: [id number] Arrival date & time: 10/10/23  2157     History  Chief Complaint  Patient presents with   Urinary Retention    [name]. is a 82 y.o. male.  HPI Patient presents for difficulty urinating.  Onset was today.  Last urination was around 7 PM.  He describes this as a very small volume and feeling of not voiding his bladder at the time.  Since then, he has not been able to urinate at all.  He states that he has a history of this.  He endorses a fullness sensation to his bladder.  When he did urinate a little bit, there was some burning sensation.  Additionally, he has recently noticed increased swelling to his lower extremities.  He denies any other recent symptoms.  Medical history includes CKD, HLD, HTN, BPH, CAD.  He had recent hospitalizations for pneumonia 2 months ago.  Since that time, he has been working with physical therapy at home.  He has had increased mobility and has been able to ambulate with a walker.    Home Medications Prior to Admission medications   Medication Sig Start Date End Date Taking? Authorizing Provider  aspirin EC 81 MG tablet Take 81 mg by mouth daily.    [provider]  Cholecalciferol (VITAMIN D HIGH POTENCY) 25 MCG (1000 UT) capsule Take 5,000 Units by mouth at bedtime.    [provider]  clopidogrel (PLAVIX) 75 MG tablet Take 75 mg by mouth daily. 1 Tablet Daily    [provider]  Cyanocobalamin (VITAMIN B-12 IJ) Inject 1 mL as directed every 30 (thirty) days.     [provider]  docusate sodium (COLACE) 100 MG capsule Take 1 capsule (100 mg total) by mouth 2 (two) times daily. 08/26/23   Meredeth Ide, MD  ipratropium-albuterol (DUONEB) 0.5-2.5 (3) MG/3ML SOLN Take 3 mLs by nebulization 3 (three) times daily. 08/01/23   Johnson, Clanford L, MD  mupirocin ointment (BACTROBAN) 2 % Apply 1 Application topically 3 (three) times  daily. 08/17/23   [provider]  nitroGLYCERIN (NITROSTAT) 0.4 MG SL tablet Place 0.4 mg under the tongue every 5 (five) minutes as needed.  Patient not taking: Reported on 08/13/2023 06/23/17   [provider]  oxycodone (ROXICODONE) 30 MG immediate release tablet Take 30 mg by mouth every 6 (six) hours. 07/23/23   [provider]  polyethylene glycol (MIRALAX / GLYCOLAX) 17 g packet Take 17 g by mouth daily. 08/27/23   Meredeth Ide, MD  predniSONE (DELTASONE) 20 MG tablet Take 2 tablets (40 mg total) by mouth daily with breakfast. 08/27/23   Meredeth Ide, MD  predniSONE (DELTASONE) 5 MG tablet Take 1 tablet (5 mg total) by mouth daily with breakfast. 10/06/23   Mannam, Praveen, MD  senna-docusate (SENOKOT-S) 8.6-50 MG tablet Take 1 tablet by mouth at bedtime as needed for mild constipation. 08/26/23   Meredeth Ide, MD  sulfamethoxazole-trimethoprim (BACTRIM DS) 800-160 MG tablet Take 1 tablet by mouth 3 (three) times a week. 08/27/23   Meredeth Ide, MD      Allergies    Crestor [rosuvastatin] and Paroxetine    Review of Systems   Review of Systems  Cardiovascular:  Positive for leg swelling.  Gastrointestinal:  Positive for abdominal distention and abdominal pain.  Genitourinary:  Positive for difficulty urinating.  All other systems reviewed and are negative.  Physical Exam Updated Vital Signs BP 102/68   Pulse 92   Temp 98.2 F (36.8 C)   Resp (!) 8   Ht 6\' 2"  (1.88 m)   Wt 74.8 kg   SpO2 100%   BMI 21.18 kg/m  Physical Exam Vitals and nursing note reviewed.  Constitutional:      General: He is not in acute distress.    Appearance: Normal appearance. He is well-developed. He is not ill-appearing, toxic-appearing or diaphoretic.  HENT:     Head: Normocephalic and atraumatic.     Right Ear: External ear normal.     Left Ear: External ear normal.     Nose: Nose normal.     Mouth/Throat:     Mouth: Mucous membranes are moist.  Eyes:      Extraocular Movements: Extraocular movements intact.     Conjunctiva/sclera: Conjunctivae normal.  Cardiovascular:     Rate and Rhythm: Normal rate and regular rhythm.  Pulmonary:     Effort: Pulmonary effort is normal. No respiratory distress.  Abdominal:     General: There is distension.     Palpations: Abdomen is soft.     Tenderness: There is abdominal tenderness.  Musculoskeletal:        General: No swelling. Normal range of motion.     Cervical back: Normal range of motion and neck supple.     Right lower leg: Edema present.     Left lower leg: Edema present.  Skin:    General: Skin is warm and dry.     Coloration: Skin is not jaundiced or pale.  Neurological:     General: No focal deficit present.     Mental Status: He is alert and oriented to person, place, and time.     Sensory: No sensory deficit.     Motor: No weakness.  Psychiatric:        Mood and Affect: Mood normal.        Behavior: Behavior normal.     ED Results / Procedures / Treatments   Labs (all labs ordered are listed, but only abnormal results are displayed) Labs Reviewed  CBC WITH DIFFERENTIAL/PLATELET - Abnormal; Notable for the following components:      Result Value   WBC 13.4 (*)    RBC 3.73 (*)    Hemoglobin 11.7 (*)    HCT 37.4 (*)    MCV 100.3 (*)    RDW 18.6 (*)    Platelets 125 (*)    Neutro Abs 9.7 (*)    Abs Immature Granulocytes 0.15 (*)    All other components within normal limits  COMPREHENSIVE METABOLIC PANEL - Abnormal; Notable for the following components:   Sodium 131 (*)    Chloride 94 (*)    Glucose, Bld 124 (*)    Calcium 8.7 (*)    Total Protein 5.9 (*)    Albumin 3.3 (*)    AST 14 (*)    All other components within normal limits  URINALYSIS, ROUTINE W REFLEX MICROSCOPIC  BRAIN NATRIURETIC PEPTIDE    EKG EKG Interpretation Date/Time:  Sunday October 10 2023 22:04:37 EDT Ventricular Rate:  108 PR Interval:  124 QRS Duration:  74 QT Interval:  310 QTC  Calculation: 415 R Axis:   41  Text Interpretation: Sinus tachycardia Non-specific ST-t changes Confirmed by Cathren Laine ([id number]) on 10/10/2023 10:08:00 PM  Radiology DG Chest 2 View  Result Date: 10/10/2023 CLINICAL DATA:  Increased bilateral lower extremity swelling. EXAM: CHEST - 2  VIEW COMPARISON:  08/23/2023. FINDINGS: The heart size and mediastinal contours are stable. There is atherosclerotic calcification of the aorta. Interstitial prominence is present bilaterally and improved from the prior exam. No effusion or pneumothorax. No acute osseous abnormality. IMPRESSION: Interstitial prominence bilaterally with decreasing airspace disease at the lung bases. Electronically Signed   By: Thornell Sartorius M.D.   On: 10/10/2023 22:42    Procedures Procedures    Medications Ordered in ED Medications - No data to display  ED Course/ Medical Decision Making/ A&P                                 Medical Decision Making Amount and/or Complexity of Data Reviewed Labs: ordered. Radiology: ordered. ECG/medicine tests: ordered.   This patient presents to the ED for concern of difficulty urinating, this involves an extensive number of treatment options, and is a complaint that carries with it a high risk of complications and morbidity.  The differential diagnosis includes BPH, UTI, medication side effect, neoplasm   Co morbidities that complicate the patient evaluation  CKD, HLD, HTN, BPH, CAD   Additional history obtained:  Additional history obtained from patient's wife External records from outside source obtained and reviewed including EMR   Lab Tests:  I Ordered, and personally interpreted labs.  The pertinent results include: Anemia is improved from baseline.  A leukocytosis is present.  Kidney function and electrolytes are normal.  There is no evidence of UTI.  BNP is normal   Imaging Studies ordered:  I ordered imaging studies including chest x-ray I independently  visualized and interpreted imaging which showed no acute findings, improved airspace disease from prior studies I agree with the radiologist interpretation   Cardiac Monitoring: / EKG:  The patient was maintained on a cardiac monitor.  I personally viewed and interpreted the cardiac monitored which showed an underlying rhythm of: Sinus rhythm   Problem List / ED Course / Critical interventions / Medication management  Patient presents for difficulty urinating.  Onset was today.  Vital signs on arrival are notable for tachycardia.  Bladder scan confirms 700 cc plus of urine in his bladder.  Foley catheter was ordered.  Patient also has concerns about recent lower extremity swelling.  This is present on exam, greater on the right.  BNP was normal.  He does have some hypoalbuminemia which may be contributing.  He has had decreased mobility since his hospitalization 2 months ago.  He is at risk for VTE.  Currently, ultrasound studies are not available at this time of night.  Order was placed for outpatient DVT study.  Foley catheter was successfully placed.  Patient had resolution of his lower abdominal discomfort.  Urinalysis did not show evidence of UTI.  Patient was advised to keep Foley in until urology follow-up.  He was discharged in stable condition.   Social Determinants of Health:  Has access to outpatient care        Final Clinical Impression(s) / ED Diagnoses Final diagnoses:  Urinary retention    Rx / DC Orders ED Discharge Orders          Ordered    Lower Ext Bilat Venous US       Comments: IMPORTANT PATIENT INSTRUCTIONS:  Your ED provider has recommended an Outpatient Ultrasound.  Please call [phone] to schedule an appointment.  If your appointment is scheduled for a Saturday, Sunday or holiday, please go to the [facility] Registration Desk at least 15 minutes prior to your appointment time and tell them you are there for an ultrasound.     If your appointment is scheduled for a weekday (Monday-Friday), please go directly to the [facility] Radiology Department at least 15 minutes prior to your appointment time and tell them you are there for an ultrasound.  Please call [phone] with questions.   10/11/23 0133              Gloris Manchester, MD 10/11/23 [phone]

## 2023-10-12 NOTE — Progress Notes (Deleted)
Name: Travis Woodward. DOB: 21-Jan-1941 MRN: [id number]  History of Present Illness: Travis Woodward is a 82 y.o. male who presents today as a new patient at [facility] [place]. All available relevant medical records have been reviewed. - GU history:  1. BPH. - Previously seen by Travis Woodward at [facility]. - Per visit note on 04/29/2011: "He has been seen and evaluated in the past by Travis Woodward and apparently,  has undergone a prostate biopsy that was negative." 2. Prior episodes of postoperative urinary retention. - Improved with Flomax.   He reports chief complaint of acute urinary retention.  Recent history:  > 10/10/2023: Seen in ER for difficulty voiding, mild dysuria, and sensation of incomplete bladder emptying. - Urinalysis did not show evidence of UTI.  - Normal renal function (creatinine 1.14, GFR >60). - Bladder scan showed >700 ml of urine in his bladder. Foley catheter placed with subsequent resolution of his lower abdominal discomfort.   > No recent surgeries.  Today: He reports the catheter is draining***. He {Actions; denies-reports:120008} gross hematuria.  He {Actions; denies-reports:120008} flank pain or abdominal pain. He {Actions; denies-reports:120008} fevers, nausea, or vomiting.  He {Actions; denies-reports:120008} constipation. He *** taking laxatives, stool softeners, or fiber supplements.  ***voiding trial ***start Flomax    Fall Screening: Do you usually have a device to assist in your mobility? {yes/no:20286} ***cane / ***walker / ***wheelchair   Medications: Current Outpatient Medications  Medication Sig Dispense Refill   aspirin EC 81 MG tablet Take 81 mg by mouth daily.     Cholecalciferol (VITAMIN D HIGH POTENCY) 25 MCG (1000 UT) capsule Take 5,000 Units by mouth at bedtime.     clopidogrel (PLAVIX) 75 MG tablet Take 75 mg by mouth daily. 1 Tablet Daily     Cyanocobalamin (VITAMIN B-12 IJ) Inject 1 mL as directed every 30  (thirty) days.      docusate sodium (COLACE) 100 MG capsule Take 1 capsule (100 mg total) by mouth 2 (two) times daily. 30 capsule 0   ipratropium-albuterol (DUONEB) 0.5-2.5 (3) MG/3ML SOLN Take 3 mLs by nebulization 3 (three) times daily. 480 mL 2   mupirocin ointment (BACTROBAN) 2 % Apply 1 Application topically 3 (three) times daily.     nitroGLYCERIN (NITROSTAT) 0.4 MG SL tablet Place 0.4 mg under the tongue every 5 (five) minutes as needed.  (Patient not taking: Reported on 08/13/2023)     oxycodone (ROXICODONE) 30 MG immediate release tablet Take 30 mg by mouth every 6 (six) hours.     polyethylene glycol (MIRALAX / GLYCOLAX) 17 g packet Take 17 g by mouth daily. 14 each 0   predniSONE (DELTASONE) 20 MG tablet Take 2 tablets (40 mg total) by mouth daily with breakfast. 60 tablet 1   predniSONE (DELTASONE) 5 MG tablet Take 1 tablet (5 mg total) by mouth daily with breakfast. 30 tablet 1   senna-docusate (SENOKOT-S) 8.6-50 MG tablet Take 1 tablet by mouth at bedtime as needed for mild constipation. 30 tablet 0   sulfamethoxazole-trimethoprim (BACTRIM DS) 800-160 MG tablet Take 1 tablet by mouth 3 (three) times a week. 30 tablet 1   No current facility-administered medications for this visit.    Allergies: Allergies  Allergen Reactions   Crestor [Rosuvastatin]    Paroxetine Rash    Past Medical History:  Diagnosis Date   Chronic kidney disease    stage 3   Colon polyps    Complication of anesthesia    difficulty urinating  Coronary artery disease    Enlarged prostate    Hyperlipidemia    Hypertension    Past Surgical History:  Procedure Laterality Date   acd fusion & plating     BACK SURGERY     x 6   CARDIAC CATHETERIZATION  2005   CHOLECYSTECTOMY N/A 07/19/2017   Procedure: LAPAROSCOPIC CHOLECYSTECTOMY;  Surgeon: Franky Macho, MD;  Location: [facility];  Service: General;  Laterality: N/A;   CORONARY STENT PLACEMENT  2005   HEMORRHOID SURGERY     HIP ARTHROPLASTY Left  06/29/2019   Procedure: ARTHROPLASTY BIPOLAR HIP (HEMIARTHROPLASTY);  Surgeon: Travis Lucy, MD;  Location: [facility];  Service: Orthopedics;  Laterality: Left;   LAPAROSCOPIC APPENDECTOMY  04/17/2012   Procedure: APPENDECTOMY LAPAROSCOPIC;  Surgeon: Travis Heading, MD;  Location: [facility];  Service: General;  Laterality: N/A;   TONSILLECTOMY     Family History  Problem Relation Age of Onset   Heart attack Mother    Tuberculosis Maternal Grandfather    Adrenal disorder Neg Hx    Colon cancer Neg Hx    Stomach cancer Neg Hx    Pancreatic cancer Neg Hx    Esophageal cancer Neg Hx    Social History   Socioeconomic History   Marital status: Married    Spouse name: Not on file   Number of children: 2   Years of education: Not on file   Highest education level: Not on file  Occupational History   Occupation: retired  Tobacco Use   Smoking status: Former    Current packs/day: 0.00    Types: Cigarettes    Quit date: 07/16/1972    Years since quitting: 51.2   Smokeless tobacco: Never  Vaping Use   Vaping status: Never Used  Substance and Sexual Activity   Alcohol use: No   Drug use: No   Sexual activity: Never  Other Topics Concern   Not on file  Social History Narrative   Not on file   Social Determinants of Health   Financial Resource Strain: Not on file  Food Insecurity: No Food Insecurity (08/13/2023)   Hunger Vital Sign    Worried About Running Out of Food in the Last Year: Never true    Ran Out of Food in the Last Year: Never true  Transportation Needs: No Transportation Needs (08/13/2023)   PRAPARE - Administrator, Civil Service (Medical): No    Lack of Transportation (Non-Medical): No  Physical Activity: Not on file  Stress: Not on file  Social Connections: Not on file  Intimate Partner Violence: Not At Risk (08/13/2023)   Humiliation, Afraid, Rape, and Kick questionnaire    Fear of Current or Ex-Partner: No    Emotionally Abused: No    Physically  Abused: No    Sexually Abused: No    SUBJECTIVE  Review of Systems*** Constitutional: Patient denies any unintentional weight loss or change in strength lntegumentary: Patient denies any rashes or pruritus Eyes: Patient denies ***dry eyes ENT: Patient ***denies dry mouth Cardiovascular: Patient denies chest pain or syncope Respiratory: Patient denies shortness of breath Gastrointestinal: Patient ***denies nausea, vomiting, constipation, or diarrhea Musculoskeletal: Patient denies muscle cramps or weakness Neurologic: Patient denies convulsions or seizures Allergic/Immunologic: Patient denies recent allergic reaction(s) Hematologic/Lymphatic: Patient denies bleeding tendencies Endocrine: Patient denies heat/cold intolerance  GU: As per HPI.  OBJECTIVE There were no vitals filed for this visit. There is no height or weight on file to calculate BMI.  Physical Examination*** Constitutional: No  obvious distress; patient is non-toxic appearing  Cardiovascular: No visible lower extremity edema.  Respiratory: The patient does not have audible wheezing/stridor; respirations do not appear labored  Gastrointestinal: Abdomen non-distended Musculoskeletal: Normal ROM of UEs  Skin: No obvious rashes/open sores  Neurologic: CN 2-12 grossly intact Psychiatric: Answered questions appropriately with normal affect  Hematologic/Lymphatic/Immunologic: No obvious bruises or sites of spontaneous bleeding  ***GU: Catheter draining ***clear yellow urine.   ASSESSMENT No diagnosis found. *** We discussed pt's urinary retention and possible etiologies including temporary detrusor areflexia, neurogenic bladder, ***BPH, constipation, anticholinergic medication use. Voiding trial was offered.  *** Voiding trial was done and pt was able to successfully empty bladder of the contents instilled. Foley catheter to remain out. Discussion was had about increasing water intake for the next few day to insure  good voiding. Pt advised to return if unable to void, or go to ER if after clinic hours.  *** Voiding trial was performed and pt was unable to successfully empty bladder of the contents instilled. Will attempt voiding trial again in 2 weeks. If unable to pass voiding trial at that time, we will pursue further evaluation with urodynamic testing.  ***Foley catheter to remain in place. ***CIC teaching was performed by nursing staff and supplies were given/ordered from [employer]. Pt is aware they will need to perform CIC ***x/day. *** Advised pt to CIC as needed and pt is aware to call if needing to CIC regularly so we can order supplies for them. *** Topical lidocaine jelly ordered for use as lubricant for comfort; may also be applied to urethra prior to CIC.  Will plan for follow up in ***months or sooner if needed. Pt verbalized understanding and agreement. All questions were answered.  PLAN Advised the following: Foley catheter ***discontinued. ***No follow-ups on file.  No orders of the defined types were placed in this encounter.   It has been explained that the patient is to follow regularly with their PCP in addition to all other providers involved in their care and to follow instructions provided by these respective offices. Patient advised to contact urology clinic if any urologic-pertaining questions, concerns, new symptoms or problems arise in the interim period.  There are no Patient Instructions on file for this visit.  Electronically signed by:  Donnita Falls, MSN, FNP-C, CUNP 10/12/2023 1:08 PM

## 2023-10-13 ENCOUNTER — Encounter (HOSPITAL_COMMUNITY): Payer: Self-pay

## 2023-10-13 ENCOUNTER — Ambulatory Visit (HOSPITAL_COMMUNITY)
Admission: RE | Admit: 2023-10-13 | Discharge: 2023-10-13 | Disposition: A | Discharge status: Home / Self Care | Payer: Medicare HMO | Source: Ambulatory Visit | Attending: Emergency Medicine | Admitting: Emergency Medicine

## 2023-10-13 ENCOUNTER — Inpatient Hospital Stay (HOSPITAL_COMMUNITY)
Admission: EM | Admit: 2023-10-13 | Discharge: 2023-10-15 | DRG: 271 | Disposition: A | Discharge status: Home / Self Care | Payer: Medicare HMO | Attending: Internal Medicine | Admitting: Internal Medicine

## 2023-10-13 ENCOUNTER — Other Ambulatory Visit (HOSPITAL_COMMUNITY): Payer: Self-pay

## 2023-10-13 DIAGNOSIS — I82403 Acute embolism and thrombosis of unspecified deep veins of lower extremity, bilateral: Secondary | ICD-10-CM

## 2023-10-13 DIAGNOSIS — K219 Gastro-esophageal reflux disease without esophagitis: Secondary | ICD-10-CM | POA: Diagnosis present

## 2023-10-13 DIAGNOSIS — I82431 Acute embolism and thrombosis of right popliteal vein: Secondary | ICD-10-CM | POA: Diagnosis present

## 2023-10-13 DIAGNOSIS — Z862 Personal history of diseases of the blood and blood-forming organs and certain disorders involving the immune mechanism: Secondary | ICD-10-CM

## 2023-10-13 DIAGNOSIS — N1831 Chronic kidney disease, stage 3a: Secondary | ICD-10-CM | POA: Diagnosis present

## 2023-10-13 DIAGNOSIS — Z79891 Long term (current) use of opiate analgesic: Secondary | ICD-10-CM

## 2023-10-13 DIAGNOSIS — Z836 Family history of other diseases of the respiratory system: Secondary | ICD-10-CM

## 2023-10-13 DIAGNOSIS — E785 Hyperlipidemia, unspecified: Secondary | ICD-10-CM | POA: Diagnosis present

## 2023-10-13 DIAGNOSIS — E8809 Other disorders of plasma-protein metabolism, not elsewhere classified: Secondary | ICD-10-CM | POA: Diagnosis present

## 2023-10-13 DIAGNOSIS — N401 Enlarged prostate with lower urinary tract symptoms: Secondary | ICD-10-CM | POA: Diagnosis present

## 2023-10-13 DIAGNOSIS — Z888 Allergy status to other drugs, medicaments and biological substances status: Secondary | ICD-10-CM

## 2023-10-13 DIAGNOSIS — I82461 Acute embolism and thrombosis of right calf muscular vein: Secondary | ICD-10-CM | POA: Diagnosis present

## 2023-10-13 DIAGNOSIS — Z79899 Other long term (current) drug therapy: Secondary | ICD-10-CM

## 2023-10-13 DIAGNOSIS — I10 Essential (primary) hypertension: Secondary | ICD-10-CM | POA: Diagnosis present

## 2023-10-13 DIAGNOSIS — R600 Localized edema: Secondary | ICD-10-CM

## 2023-10-13 DIAGNOSIS — J9611 Chronic respiratory failure with hypoxia: Secondary | ICD-10-CM | POA: Diagnosis present

## 2023-10-13 DIAGNOSIS — I82409 Acute embolism and thrombosis of unspecified deep veins of unspecified lower extremity: Secondary | ICD-10-CM | POA: Diagnosis present

## 2023-10-13 DIAGNOSIS — D696 Thrombocytopenia, unspecified: Secondary | ICD-10-CM | POA: Diagnosis present

## 2023-10-13 DIAGNOSIS — Z955 Presence of coronary angioplasty implant and graft: Secondary | ICD-10-CM | POA: Diagnosis not present

## 2023-10-13 DIAGNOSIS — J8410 Pulmonary fibrosis, unspecified: Secondary | ICD-10-CM | POA: Diagnosis present

## 2023-10-13 DIAGNOSIS — R338 Other retention of urine: Secondary | ICD-10-CM | POA: Diagnosis present

## 2023-10-13 DIAGNOSIS — I82491 Acute embolism and thrombosis of other specified deep vein of right lower extremity: Secondary | ICD-10-CM | POA: Diagnosis not present

## 2023-10-13 DIAGNOSIS — Z7901 Long term (current) use of anticoagulants: Secondary | ICD-10-CM | POA: Diagnosis not present

## 2023-10-13 DIAGNOSIS — Z981 Arthrodesis status: Secondary | ICD-10-CM

## 2023-10-13 DIAGNOSIS — Z7902 Long term (current) use of antithrombotics/antiplatelets: Secondary | ICD-10-CM

## 2023-10-13 DIAGNOSIS — Z9861 Coronary angioplasty status: Secondary | ICD-10-CM | POA: Diagnosis not present

## 2023-10-13 DIAGNOSIS — R110 Nausea: Secondary | ICD-10-CM | POA: Diagnosis not present

## 2023-10-13 DIAGNOSIS — Z9981 Dependence on supplemental oxygen: Secondary | ICD-10-CM | POA: Diagnosis not present

## 2023-10-13 DIAGNOSIS — I82401 Acute embolism and thrombosis of unspecified deep veins of right lower extremity: Secondary | ICD-10-CM | POA: Diagnosis not present

## 2023-10-13 DIAGNOSIS — U099 Post covid-19 condition, unspecified: Secondary | ICD-10-CM | POA: Diagnosis present

## 2023-10-13 DIAGNOSIS — Z7982 Long term (current) use of aspirin: Secondary | ICD-10-CM

## 2023-10-13 DIAGNOSIS — D649 Anemia, unspecified: Secondary | ICD-10-CM | POA: Diagnosis present

## 2023-10-13 DIAGNOSIS — Z96642 Presence of left artificial hip joint: Secondary | ICD-10-CM | POA: Diagnosis present

## 2023-10-13 DIAGNOSIS — Z8249 Family history of ischemic heart disease and other diseases of the circulatory system: Secondary | ICD-10-CM | POA: Diagnosis not present

## 2023-10-13 DIAGNOSIS — Z8673 Personal history of transient ischemic attack (TIA), and cerebral infarction without residual deficits: Secondary | ICD-10-CM

## 2023-10-13 DIAGNOSIS — R9431 Abnormal electrocardiogram [ECG] [EKG]: Secondary | ICD-10-CM | POA: Diagnosis not present

## 2023-10-13 DIAGNOSIS — I130 Hypertensive heart and chronic kidney disease with heart failure and stage 1 through stage 4 chronic kidney disease, or unspecified chronic kidney disease: Secondary | ICD-10-CM | POA: Diagnosis present

## 2023-10-13 DIAGNOSIS — I82412 Acute embolism and thrombosis of left femoral vein: Secondary | ICD-10-CM | POA: Diagnosis not present

## 2023-10-13 DIAGNOSIS — I5032 Chronic diastolic (congestive) heart failure: Secondary | ICD-10-CM | POA: Diagnosis present

## 2023-10-13 DIAGNOSIS — I82413 Acute embolism and thrombosis of femoral vein, bilateral: Principal | ICD-10-CM | POA: Diagnosis present

## 2023-10-13 DIAGNOSIS — I2510 Atherosclerotic heart disease of native coronary artery without angina pectoris: Secondary | ICD-10-CM | POA: Diagnosis present

## 2023-10-13 DIAGNOSIS — Z87891 Personal history of nicotine dependence: Secondary | ICD-10-CM | POA: Diagnosis not present

## 2023-10-13 DIAGNOSIS — I82493 Acute embolism and thrombosis of other specified deep vein of lower extremity, bilateral: Secondary | ICD-10-CM | POA: Diagnosis not present

## 2023-10-13 LAB — CBC WITH DIFFERENTIAL/PLATELET
Abs Immature Granulocytes: 0.05 10*3/uL (ref 0.00–0.07)
Basophils Absolute: 0 10*3/uL (ref 0.0–0.1)
Basophils Relative: 0 %
Eosinophils Absolute: 0 10*3/uL (ref 0.0–0.5)
Eosinophils Relative: 0 %
HCT: 33.4 % — ABNORMAL LOW (ref 39.0–52.0)
Hemoglobin: 10.6 g/dL — ABNORMAL LOW (ref 13.0–17.0)
Immature Granulocytes: 1 %
Lymphocytes Relative: 3 %
Lymphs Abs: 0.3 10*3/uL — ABNORMAL LOW (ref 0.7–4.0)
MCH: 32 pg (ref 26.0–34.0)
MCHC: 31.7 g/dL (ref 30.0–36.0)
MCV: 100.9 fL — ABNORMAL HIGH (ref 80.0–100.0)
Monocytes Absolute: 0.2 10*3/uL (ref 0.1–1.0)
Monocytes Relative: 2 %
Neutro Abs: 9.2 10*3/uL — ABNORMAL HIGH (ref 1.7–7.7)
Neutrophils Relative %: 94 %
Platelets: 101 10*3/uL — ABNORMAL LOW (ref 150–400)
RBC: 3.31 MIL/uL — ABNORMAL LOW (ref 4.22–5.81)
RDW: 17.9 % — ABNORMAL HIGH (ref 11.5–15.5)
WBC: 9.7 10*3/uL (ref 4.0–10.5)
nRBC: 0 % (ref 0.0–0.2)

## 2023-10-13 LAB — BASIC METABOLIC PANEL
Anion gap: 7 (ref 5–15)
BUN: 18 mg/dL (ref 8–23)
CO2: 27 mmol/L (ref 22–32)
Calcium: 8.6 mg/dL — ABNORMAL LOW (ref 8.9–10.3)
Chloride: 102 mmol/L (ref 98–111)
Creatinine, Ser: 1.18 mg/dL (ref 0.61–1.24)
GFR, Estimated: >60 mL/min (ref >60)
Glucose, Bld: 131 mg/dL — ABNORMAL HIGH (ref 70–99)
Potassium: 4.1 mmol/L (ref 3.5–5.1)
Sodium: 136 mmol/L (ref 135–145)

## 2023-10-13 LAB — HEPARIN LEVEL (UNFRACTIONATED): Heparin Unfractionated: 0.16 [IU]/mL — ABNORMAL LOW (ref 0.30–0.70)

## 2023-10-13 MED ORDER — ONDANSETRON HCL 4 MG PO TABS: 4.0000 mg | ORAL_TABLET | Freq: Four times a day (QID) | ORAL | Status: DC | PRN

## 2023-10-13 MED ORDER — PANTOPRAZOLE SODIUM 40 MG PO TBEC
40.0000 mg | DELAYED_RELEASE_TABLET | Freq: Every day | ORAL | Status: DC
  Administered 2023-10-13 – 2023-10-15 (×2): 40 mg via ORAL
  Filled 2023-10-13 (×2): qty 1

## 2023-10-13 MED ORDER — SODIUM CHLORIDE 0.9% FLUSH: 3.0000 mL | Freq: Two times a day (BID) | INTRAVENOUS | Status: DC

## 2023-10-13 MED ORDER — PREDNISONE 20 MG PO TABS
30.0000 mg | ORAL_TABLET | Freq: Every day | ORAL | Status: DC
  Administered 2023-10-15: 30 mg via ORAL
  Filled 2023-10-13: qty 1

## 2023-10-13 MED ORDER — OXYCODONE HCL 5 MG PO TABS: 30.0000 mg | ORAL_TABLET | Freq: Four times a day (QID) | ORAL | Status: DC | PRN

## 2023-10-13 MED ORDER — HEPARIN (PORCINE) 25000 UT/250ML-% IV SOLN
1200.0000 [IU]/h | INTRAVENOUS | Status: AC
  Administered 2023-10-13: 1050 [IU]/h via INTRAVENOUS
  Administered 2023-10-14: 1200 [IU]/h via INTRAVENOUS
  Filled 2023-10-13 (×2): qty 250

## 2023-10-13 MED ORDER — SODIUM CHLORIDE 0.9% FLUSH: 3.0000 mL | INTRAVENOUS | Status: DC | PRN

## 2023-10-13 MED ORDER — ONDANSETRON HCL 4 MG/2ML IJ SOLN: 4.0000 mg | Freq: Four times a day (QID) | INTRAMUSCULAR | Status: DC | PRN

## 2023-10-13 MED ORDER — TRAZODONE HCL 50 MG PO TABS
50.0000 mg | ORAL_TABLET | Freq: Every evening | ORAL | Status: DC | PRN
  Administered 2023-10-13: 50 mg via ORAL
  Filled 2023-10-13: qty 1

## 2023-10-13 MED ORDER — HEPARIN BOLUS VIA INFUSION
4000.0000 [IU] | Freq: Once | INTRAVENOUS | Status: AC
  Administered 2023-10-13: 4000 [IU] via INTRAVENOUS

## 2023-10-13 MED ORDER — ACETAMINOPHEN 325 MG PO TABS: 650.0000 mg | ORAL_TABLET | Freq: Four times a day (QID) | ORAL | Status: DC | PRN

## 2023-10-13 MED ORDER — SODIUM CHLORIDE 0.9% FLUSH
10.0000 mL | Freq: Two times a day (BID) | INTRAVENOUS | Status: DC
  Administered 2023-10-15: 10 mL via INTRAVENOUS

## 2023-10-13 MED ORDER — ACETAMINOPHEN 650 MG RE SUPP: 650.0000 mg | Freq: Four times a day (QID) | RECTAL | Status: DC | PRN

## 2023-10-13 MED ORDER — SENNOSIDES-DOCUSATE SODIUM 8.6-50 MG PO TABS
2.0000 | ORAL_TABLET | Freq: Every day | ORAL | Status: DC
  Administered 2023-10-13 – 2023-10-14 (×2): 2 via ORAL
  Filled 2023-10-13 (×2): qty 2

## 2023-10-13 MED ORDER — BISACODYL 10 MG RE SUPP: 10.0000 mg | Freq: Every day | RECTAL | Status: DC | PRN

## 2023-10-13 MED ORDER — SODIUM CHLORIDE 0.9% FLUSH
3.0000 mL | Freq: Two times a day (BID) | INTRAVENOUS | Status: DC
  Administered 2023-10-15: 3 mL via INTRAVENOUS

## 2023-10-13 MED ORDER — CLOPIDOGREL BISULFATE 75 MG PO TABS
75.0000 mg | ORAL_TABLET | Freq: Every day | ORAL | Status: DC
  Administered 2023-10-15: 75 mg via ORAL
  Filled 2023-10-13: qty 1

## 2023-10-13 MED ORDER — ASPIRIN 81 MG PO TBEC
81.0000 mg | DELAYED_RELEASE_TABLET | Freq: Every day | ORAL | Status: DC
  Administered 2023-10-15: 81 mg via ORAL
  Filled 2023-10-13: qty 1

## 2023-10-13 MED ORDER — NITROGLYCERIN 0.4 MG SL SUBL: 0.4000 mg | SUBLINGUAL_TABLET | SUBLINGUAL | Status: DC | PRN

## 2023-10-13 MED ORDER — POLYETHYLENE GLYCOL 3350 17 G PO PACK
17.0000 g | PACK | Freq: Every day | ORAL | Status: DC
  Administered 2023-10-15: 17 g via ORAL
  Filled 2023-10-13: qty 1

## 2023-10-13 NOTE — ED Notes (Signed)
Foley care performed. Statlock changed. Patient had leg bag, this RN changed to standard foley drainage bag.

## 2023-10-13 NOTE — TOC Benefit Eligibility Note (Signed)
Patient Product/process development scientist completed.    The patient is insured through [employer]. Patient has Medicare and is not eligible for a copay card, but may be able to apply for patient assistance, if available.    Ran test claim for Eliquis 5 mg and the current 30 day co-pay is $45.00.   This test claim was processed through [facility]- copay amounts may vary at other pharmacies due to pharmacy/plan contracts, or as the patient moves through the different stages of their insurance plan.     Travis Woodward, CPHT Pharmacy Technician III Certified Patient Advocate [facility] Patient Advocate Team Direct Number: [phone]  Fax: [phone]

## 2023-10-13 NOTE — Progress Notes (Signed)
PHARMACY - ANTICOAGULATION CONSULT NOTE  Pharmacy Consult for heparin Indication: DVT  Allergies  Allergen Reactions   Crestor [Rosuvastatin] Other (See Comments)    unknown   Paroxetine Rash    Patient Measurements: Height: 6\' 2"  (188 cm) Weight: 74.8 kg (165 lb) IBW/kg (Calculated) : 82.2 Heparin Dosing Weight: 75kg  Vital Signs: Temp: 98.1 F (36.7 C) (10/16 1154) Temp Source: Oral (10/16 1154) BP: 133/71 (10/16 1154) Pulse Rate: 102 (10/16 1154)  Labs: Recent Labs    10/10/23 2218 10/13/23 1413  HGB 11.7* 10.6*  HCT 37.4* 33.4*  PLT 125* 101*  CREATININE 1.14 1.18    Estimated Creatinine Clearance: 51.1 mL/min (by C-G formula based on SCr of 1.18 mg/dL).   Medical History: Past Medical History:  Diagnosis Date   Chronic kidney disease    stage 3   Colon polyps    Complication of anesthesia    difficulty urinating    Coronary artery disease    Enlarged prostate    Hyperlipidemia    Hypertension    Assessment: 82 year old male admitted to [facility] with lower extremity edema. Dopplers positive for right sided DVT and thrombus of the profundofemoral vein on the left. New orders to start IV heparin.  Goal of Therapy:  Heparin level 0.3-0.7 units/ml Monitor platelets by anticoagulation protocol: Yes   Plan:  Give 4000 units bolus x 1 Start heparin infusion at 1050 units/hr Check anti-Xa level in 8 hours and daily while on heparin Continue to monitor H&H and platelets  Sheppard Coil PharmD., BCPS Clinical Pharmacist 10/13/2023 3:01 PM

## 2023-10-13 NOTE — ED Provider Notes (Signed)
[place] EMERGENCY DEPARTMENT AT [facility] Provider Note   CSN: [id number] Arrival date & time: 10/13/23  1149     History  Chief Complaint  Patient presents with   DVT    Travis Woodward. is a 82 y.o. male.  HPI     Travis Woodward. is a 82 y.o. 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  Patient was seen here on 10/10/2023 for swelling to bilateral lower extremities with right greater than left.  There was concern for DVT, unfortunately ultrasound unavailable at time of patient's ER visit.  He returns today for scheduled venous imaging and found to have extensive right-sided DVT and thrombus of the profundofemoral vein on the left.  Patient is currently on aspirin and Plavix secondary to his coronary artery disease and cardiac stent.  He denies any fever, chills, increased shortness of breath, chest pain, redness or pain to his lower extremities.  Home Medications Prior to Admission medications   Medication Sig Start Date End Date Taking? Authorizing Provider  aspirin EC 81 MG tablet Take 81 mg by mouth daily.    [provider]  Cholecalciferol (VITAMIN D HIGH POTENCY) 25 MCG (1000 UT) capsule Take 5,000 Units by mouth at bedtime.    [provider]  clopidogrel (PLAVIX) 75 MG tablet Take 75 mg by mouth daily. 1 Tablet Daily    [provider]  Cyanocobalamin (VITAMIN B-12 IJ) Inject 1 mL as directed every 30 (thirty) days.     [provider]  docusate sodium (COLACE) 100 MG capsule Take 1 capsule (100 mg total) by mouth 2 (two) times daily. 08/26/23   Meredeth Ide, MD  ipratropium-albuterol (DUONEB) 0.5-2.5 (3) MG/3ML SOLN Take 3 mLs by nebulization 3 (three) times daily. 08/01/23   Johnson, Clanford L, MD  mupirocin ointment  (BACTROBAN) 2 % Apply 1 Application topically 3 (three) times daily. 08/17/23   [provider]  nitroGLYCERIN (NITROSTAT) 0.4 MG SL tablet Place 0.4 mg under the tongue every 5 (five) minutes as needed.  Patient not taking: Reported on 08/13/2023 06/23/17   [provider]  oxycodone (ROXICODONE) 30 MG immediate release tablet Take 30 mg by mouth every 6 (six) hours. 07/23/23   [provider]  polyethylene glycol (MIRALAX / GLYCOLAX) 17 g packet Take 17 g by mouth daily. 08/27/23   Meredeth Ide, MD  predniSONE (DELTASONE) 20 MG tablet Take 2 tablets (40 mg total) by mouth daily with breakfast. 08/27/23   Meredeth Ide, MD  predniSONE (DELTASONE) 5 MG tablet Take 1 tablet (5 mg total) by mouth daily with breakfast. 10/06/23   Mannam, Praveen, MD  senna-docusate (SENOKOT-S) 8.6-50 MG tablet Take 1 tablet by mouth at bedtime as needed for mild constipation. 08/26/23   Meredeth Ide, MD  sulfamethoxazole-trimethoprim (BACTRIM DS) 800-160 MG tablet Take 1 tablet by mouth 3 (three) times a week. 08/27/23   Meredeth Ide, MD      Allergies    Crestor [rosuvastatin] and Paroxetine    Review of Systems   Review of Systems  Constitutional:  Negative for chills and fever.  Respiratory:  Negative for cough and shortness of breath.   Cardiovascular:  Negative for chest pain.  Gastrointestinal:  Negative for abdominal pain, diarrhea, nausea and vomiting.  Genitourinary:  Negative for dysuria.  Musculoskeletal:  Negative for arthralgias and back pain.       Bilateral lower leg swelling, right greater than left  Skin:  Negative for color change and rash.  Neurological:  Negative for dizziness, weakness and headaches.    Physical Exam Updated Vital Signs BP 133/71 (BP Location: Right Arm)   Pulse (!) 102   Temp 98.1 F (36.7 C) (Oral)   Resp 20   Ht 6\' 2"  (1.88 m)   Wt 74.8 kg   SpO2 93%   BMI 21.18 kg/m  Physical Exam Vitals and nursing note reviewed.  Constitutional:       General: He is not in acute distress.    Appearance: Normal appearance. He is not ill-appearing or toxic-appearing.  Cardiovascular:     Rate and Rhythm: Normal rate and regular rhythm.     Comments: Bilateral dorsalis pedis pulses and posterior tibial pulses heard with portable Doppler.  DP pulse on right slightly diminished.  Toes are warm, 2 to 3-second cap refill. Pulmonary:     Effort: Pulmonary effort is normal.  Abdominal:     Palpations: Abdomen is soft.     Tenderness: There is no abdominal tenderness.  Musculoskeletal:        General: No tenderness.     Right lower leg: Edema present.     Left lower leg: Edema present.     Comments: Pitting edema bilateral lower extremities, 2+ on the right 1+ on the left.  No calf tenderness or erythema on exam.  No excessive warmth of the extremities.  Skin:    General: Skin is warm.     Capillary Refill: Capillary refill takes less than 2 seconds.     Findings: No erythema.  Neurological:     General: No focal deficit present.     Mental Status: He is alert.     Sensory: No sensory deficit.     Motor: No weakness.     ED Results / Procedures / Treatments   Labs (all labs ordered are listed, but only abnormal results are displayed) Labs Reviewed  CBC WITH DIFFERENTIAL/PLATELET - Abnormal; Notable for the following components:      Result Value   RBC 3.31 (*)    Hemoglobin 10.6 (*)    HCT 33.4 (*)    MCV 100.9 (*)    RDW 17.9 (*)    Platelets 101 (*)    Neutro Abs 9.2 (*)    Lymphs Abs 0.3 (*)    All other components within normal limits  BASIC METABOLIC PANEL - Abnormal; Notable for the following components:   Glucose, Bld 131 (*)    Calcium 8.6 (*)    All other components within normal limits  HEPARIN LEVEL (UNFRACTIONATED)    EKG None  Radiology US Venous Img Lower Bilateral (DVT)  Result Date: 10/13/2023 CLINICAL DATA:  Bilateral lower extremity edema for 2 weeks, right-greater-than-left. Patient is on  Plavix EXAM: Bilateral LOWER EXTREMITY VENOUS DOPPLER ULTRASOUND TECHNIQUE: Gray-scale sonography with compression, as well as color and duplex ultrasound, were performed to evaluate the deep venous system(s) from the level of the common femoral vein through the popliteal and proximal calf veins. COMPARISON:  None Available. FINDINGS: VENOUS On the right there is distended appearance of the leg venous system diffusely with heterogeneous material and poor flow extending from the common femoral to the deep femoral, femoral, popliteal and calf vessels consistent with the extensive DVT. On the left there is some echogenic material with poor flow along  the deep femoral vein. The common femoral, femoral, popliteal and calf vessels show normal response to compression and flow on color and spectral Doppler. OTHER Critical Value/emergent results were called by telephone at the time of interpretation on 10/13/2023 at 8:20 am PST to a PA in the office of [facility] , who verbally acknowledged these results. Limitations: none IMPRESSION: Extensive right lower extremity DVT. Thrombus involving the left profunda femoral vein. Electronically Signed   By: Karen Kays M.D.   On: 10/13/2023 11:46    Procedures Procedures    Medications Ordered in ED Medications - No data to display  ED Course/ Medical Decision Making/ A&P                                 Medical Decision Making Patient return to have scheduled outpatient ultrasound imaging of the bilateral lower extremities earlier today.  Was found to have extensive DVT involving the right with thrombus over the left profundofemoral vein as well.  Complains of welling bilateral lower extremities.  Denies any chest pain or increased shortness of breath.  Patient is on 2 L O2 at baseline.  Discharged from the hospital in August secondary to hypoxia related to post-COVID fibrosis.  Patient currently on prednisone and takes Plavix and ASA at baseline.      Amount  and/or Complexity of Data Reviewed Labs: ordered.    Details: CBC and BMP pending Radiology: ordered.    Details: Ultrasound today shows extensive DVT involving right lower extremity, thrombus involving the left profundofemoral vein. Discussion of management or test interpretation with external provider(s): Discussed findings with vascular surgeon, Dr. Myra Gianotti who recommends pt be admitted to hospitalist service and transferred to [facility] for further evaluation by vascular surgery.    Discussed findings with [facility] hospitalist, Dr. Mariea Clonts who agrees to admit            Final Clinical Impression(s) / ED Diagnoses Final diagnoses:  Acute deep vein thrombosis (DVT) of femoral vein of both lower extremities [facility])    Rx / DC Orders ED Discharge Orders     None         Pauline Aus, PA-C 10/13/23 1601    Bethann Berkshire, MD 10/14/23 1651

## 2023-10-13 NOTE — ED Triage Notes (Signed)
Pt c/o bilateral leg swelling x1.5weeks.  Denies pain.  Pt had bilateral US and was found to have DVT on R common femoral.

## 2023-10-13 NOTE — ED Notes (Signed)
ED TO INPATIENT HANDOFF REPORT  ED Nurse Name and Phone #: Charlsie Quest, RN [phone]  S Name/Age/Gender Travis Woodward. 82 y.o. male Room/Bed: APA10/APA10  Code Status   Code Status: Full Code  Home/SNF/Other Home Patient oriented to: self, place, time, and situation Is this baseline? Yes   Triage Complete: Triage complete  Chief Complaint DVT (deep venous thrombosis) (HCC) [I82.409]  Triage Note Pt c/o bilateral leg swelling x1.5weeks.  Denies pain.  Pt had bilateral US and was found to have DVT on R common femoral.     Allergies Allergies  Allergen Reactions   Crestor [Rosuvastatin] Other (See Comments)    unknown   Paroxetine Rash    Level of Care/Admitting Diagnosis ED Disposition     ED Disposition  Admit   Condition  --   Comment  Hospital Area: [facility] [[id number]]  Level of Care: Progressive [102]  Admit to Progressive based on following criteria: CARDIOVASCULAR & THORACIC of moderate stability with acute coronary syndrome symptoms/low risk myocardial infarction/hypertensive urgency/arrhythmias/heart failure potentially compromising stability and stable post cardiovascular intervention patients.  May admit patient to [facility] or [facility] if equivalent level of care is available:: No  Covid Evaluation: Asymptomatic - no recent exposure (last 10 days) testing not required  Diagnosis: DVT (deep venous thrombosis) [facility]) [[id number]]  Admitting Physician: Marylyn Ishihara  Attending Physician: Marylyn Ishihara  Certification:: I certify this patient will need inpatient services for at least 2 midnights  Expected Medical Readiness: 10/15/2023          B Medical/Surgery History Past Medical History:  Diagnosis Date   Chronic kidney disease    stage 3   Colon polyps    Complication of anesthesia    difficulty urinating    Coronary artery disease    Enlarged prostate    Hyperlipidemia    Hypertension     Past Surgical History:  Procedure Laterality Date   acd fusion & plating     BACK SURGERY     x 6   CARDIAC CATHETERIZATION  2005   CHOLECYSTECTOMY N/A 07/19/2017   Procedure: LAPAROSCOPIC CHOLECYSTECTOMY;  Surgeon: Franky Macho, MD;  Location: [facility];  Service: General;  Laterality: N/A;   CORONARY STENT PLACEMENT  2005   HEMORRHOID SURGERY     HIP ARTHROPLASTY Left 06/29/2019   Procedure: ARTHROPLASTY BIPOLAR HIP (HEMIARTHROPLASTY);  Surgeon: Teryl Lucy, MD;  Location: [facility];  Service: Orthopedics;  Laterality: Left;   LAPAROSCOPIC APPENDECTOMY  04/17/2012   Procedure: APPENDECTOMY LAPAROSCOPIC;  Surgeon: Dalia Heading, MD;  Location: [facility];  Service: General;  Laterality: N/A;   TONSILLECTOMY       A IV Location/Drains/Wounds Patient Lines/Drains/Airways Status     Active Line/Drains/Airways     Name Placement date Placement time Site Days   Peripheral IV 10/13/23 20 G Anterior;Proximal;Right Forearm 10/13/23  1426  Forearm  less than 1   Urethral Catheter Alycia, RN Straight-tip 16 Fr. 10/11/23  0130  Straight-tip  2   Pressure Injury 08/13/23 Rectum Left Stage 2 -  Partial thickness loss of dermis presenting as a shallow open injury with a red, pink wound bed without slough. 08/13/23  2131  -- 61            Intake/Output Last 24 hours  Intake/Output Summary (Last 24 hours) at 10/13/2023 1611 Last data filed at 10/13/2023 1459 Gross per 24 hour  Intake --  Output 250 ml  Net -250 ml  Labs/Imaging Results for orders placed or performed during the hospital encounter of 10/13/23 (from the past 48 hour(s))  CBC with Differential     Status: Abnormal   Collection Time: 10/13/23  2:13 PM  Result Value Ref Range   WBC 9.7 4.0 - 10.5 K/uL   RBC 3.31 (L) 4.22 - 5.81 MIL/uL   Hemoglobin 10.6 (L) 13.0 - 17.0 g/dL   HCT 69.6 (L) 29.5 - 28.4 %   MCV 100.9 (H) 80.0 - 100.0 fL   MCH 32.0 26.0 - 34.0 pg   MCHC 31.7 30.0 - 36.0 g/dL   RDW 13.2 (H) 44.0 - 10.2 %    Platelets 101 (L) 150 - 400 K/uL    Comment: SPECIMEN CHECKED FOR CLOTS Immature Platelet Fraction may be clinically indicated, consider ordering this additional test [id number] REPEATED TO VERIFY    nRBC 0.0 0.0 - 0.2 %   Neutrophils Relative % 94 %   Neutro Abs 9.2 (H) 1.7 - 7.7 K/uL   Lymphocytes Relative 3 %   Lymphs Abs 0.3 (L) 0.7 - 4.0 K/uL   Monocytes Relative 2 %   Monocytes Absolute 0.2 0.1 - 1.0 K/uL   Eosinophils Relative 0 %   Eosinophils Absolute 0.0 0.0 - 0.5 K/uL   Basophils Relative 0 %   Basophils Absolute 0.0 0.0 - 0.1 K/uL   Immature Granulocytes 1 %   Abs Immature Granulocytes 0.05 0.00 - 0.07 K/uL    Comment: Performed at [facility], [address], [place], [place] [address]  Basic metabolic panel     Status: Abnormal   Collection Time: 10/13/23  2:13 PM  Result Value Ref Range   Sodium 136 135 - 145 mmol/L   Potassium 4.1 3.5 - 5.1 mmol/L   Chloride 102 98 - 111 mmol/L   CO2 27 22 - 32 mmol/L   Glucose, Bld 131 (H) 70 - 99 mg/dL    Comment: Glucose reference range applies only to samples taken after fasting for at least 8 hours.   BUN 18 8 - 23 mg/dL   Creatinine, Ser 2.59 0.61 - 1.24 mg/dL   Calcium 8.6 (L) 8.9 - 10.3 mg/dL   GFR, Estimated >56 >38 mL/min    Comment: (NOTE) Calculated using the CKD-EPI Creatinine Equation (2021)    Anion gap 7 5 - 15    Comment: Performed at [facility], [address], [place], [place] [address]   US Venous Img Lower Bilateral (DVT)  Result Date: 10/13/2023 CLINICAL DATA:  Bilateral lower extremity edema for 2 weeks, right-greater-than-left. Patient is on Plavix EXAM: Bilateral LOWER EXTREMITY VENOUS DOPPLER ULTRASOUND TECHNIQUE: Gray-scale sonography with compression, as well as color and duplex ultrasound, were performed to evaluate the deep venous system(s) from the level of the common femoral vein through the popliteal and proximal calf veins. COMPARISON:  None Available. FINDINGS: VENOUS On the right there is  distended appearance of the leg venous system diffusely with heterogeneous material and poor flow extending from the common femoral to the deep femoral, femoral, popliteal and calf vessels consistent with the extensive DVT. On the left there is some echogenic material with poor flow along the deep femoral vein. The common femoral, femoral, popliteal and calf vessels show normal response to compression and flow on color and spectral Doppler. OTHER Critical Value/emergent results were called by telephone at the time of interpretation on 10/13/2023 at 8:20 am PST to a PA in the office of [facility] , who verbally acknowledged these results. Limitations:  none IMPRESSION: Extensive right lower extremity DVT. Thrombus involving the left profunda femoral vein. Electronically Signed   By: Karen Kays M.D.   On: 10/13/2023 11:46    Pending Labs Unresulted Labs (From admission, onward)     Start     Ordered   10/14/23 0500  CBC  Daily,   R      10/13/23 1504   10/14/23 0500  CBC  Tomorrow morning,   R        10/13/23 1607   10/14/23 0500  Basic metabolic panel  Tomorrow morning,   R        10/13/23 1607   10/13/23 2300  Heparin level (unfractionated)  Once-Timed,   TIMED        10/13/23 1504            Vitals/Pain Today's Vitals   10/13/23 1154 10/13/23 1230 10/13/23 1554 10/13/23 1556  BP: 133/71 135/68 127/73   Pulse: (!) 102 95 90 89  Resp: 20 13  10   Temp: 98.1 F (36.7 C)     TempSrc: Oral     SpO2: 93% 98% 95% 97%  Weight:      Height:      PainSc:        Isolation Precautions No active isolations  Medications Medications  heparin ADULT infusion 100 units/mL (25000 units/253mL) (1,050 Units/hr Intravenous New Bag/Given 10/13/23 1519)  aspirin EC tablet 81 mg (has no administration in time range)  oxyCODONE (Oxy IR/ROXICODONE) immediate release tablet 30 mg (has no administration in time range)  nitroGLYCERIN (NITROSTAT) SL tablet 0.4 mg (has no administration in time range)   predniSONE (DELTASONE) tablet 30 mg (has no administration in time range)  senna-docusate (Senokot-S) tablet 2 tablet (has no administration in time range)  polyethylene glycol (MIRALAX / GLYCOLAX) packet 17 g (has no administration in time range)  clopidogrel (PLAVIX) tablet 75 mg (has no administration in time range)  pantoprazole (PROTONIX) EC tablet 40 mg (has no administration in time range)  sodium chloride flush (NS) 0.9 % injection 3 mL (has no administration in time range)  sodium chloride flush (NS) 0.9 % injection 3 mL (has no administration in time range)  sodium chloride flush (NS) 0.9 % injection 3 mL (has no administration in time range)  acetaminophen (TYLENOL) tablet 650 mg (has no administration in time range)    Or  acetaminophen (TYLENOL) suppository 650 mg (has no administration in time range)  traZODone (DESYREL) tablet 50 mg (has no administration in time range)  bisacodyl (DULCOLAX) suppository 10 mg (has no administration in time range)  ondansetron (ZOFRAN) tablet 4 mg (has no administration in time range)    Or  ondansetron (ZOFRAN) injection 4 mg (has no administration in time range)  sodium chloride flush (NS) 0.9 % injection 10 mL (has no administration in time range)  heparin bolus via infusion 4,000 Units (4,000 Units Intravenous Bolus from Bag 10/13/23 1519)    Mobility walks     Focused Assessments Cardiac Assessment Handoff:    No results found for: "CKTOTAL", "CKMB", "CKMBINDEX", "TROPONINI" Lab Results  Component Value Date   DDIMER 7.05 (H) 08/23/2023   Does the Patient currently have chest pain? No    R Recommendations: See Admitting Provider Note  Report given to: Elberta Spaniel, RN  Additional Notes: Pt. With DVT to right femoral vein, currently on heparin drip at 10.5 units/hr.

## 2023-10-13 NOTE — ED Notes (Signed)
Report given to [facility].

## 2023-10-13 NOTE — Progress Notes (Signed)
Patient arrived to the floor alert and oriented CHG bath given. Patient oriented to staff and equipment, Cardiac monitoring initiated, CCMD and Vascular MD on Call notified. We'll continue to monitor.
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
## 2023-10-14 ENCOUNTER — Other Ambulatory Visit (HOSPITAL_COMMUNITY): Payer: Medicare HMO

## 2023-10-14 ENCOUNTER — Encounter (HOSPITAL_COMMUNITY)
Admission: EM | Disposition: A | Discharge status: Home / Self Care | Payer: Self-pay | Source: Home / Self Care | Attending: Internal Medicine

## 2023-10-14 ENCOUNTER — Inpatient Hospital Stay (HOSPITAL_COMMUNITY): Payer: Medicare HMO

## 2023-10-14 DIAGNOSIS — K219 Gastro-esophageal reflux disease without esophagitis: Secondary | ICD-10-CM | POA: Diagnosis not present

## 2023-10-14 DIAGNOSIS — I82493 Acute embolism and thrombosis of other specified deep vein of lower extremity, bilateral: Secondary | ICD-10-CM

## 2023-10-14 DIAGNOSIS — I2510 Atherosclerotic heart disease of native coronary artery without angina pectoris: Secondary | ICD-10-CM | POA: Diagnosis not present

## 2023-10-14 DIAGNOSIS — R9431 Abnormal electrocardiogram [ECG] [EKG]: Secondary | ICD-10-CM

## 2023-10-14 DIAGNOSIS — I82491 Acute embolism and thrombosis of other specified deep vein of right lower extremity: Secondary | ICD-10-CM

## 2023-10-14 DIAGNOSIS — Z862 Personal history of diseases of the blood and blood-forming organs and certain disorders involving the immune mechanism: Secondary | ICD-10-CM | POA: Diagnosis not present

## 2023-10-14 DIAGNOSIS — I10 Essential (primary) hypertension: Secondary | ICD-10-CM

## 2023-10-14 HISTORY — PX: PERIPHERAL VASCULAR THROMBECTOMY: CATH118306

## 2023-10-14 HISTORY — PX: PERIPHERAL VASCULAR INTERVENTION: CATH118257

## 2023-10-14 HISTORY — PX: PERIPHERAL VASCULAR ULTRASOUND/IVUS: CATH118334

## 2023-10-14 LAB — POCT ACTIVATED CLOTTING TIME
Activated Clotting Time: 116 s
Activated Clotting Time: 183 s

## 2023-10-14 LAB — CBC
HCT: 31.7 % — ABNORMAL LOW (ref 39.0–52.0)
Hemoglobin: 10.2 g/dL — ABNORMAL LOW (ref 13.0–17.0)
MCH: 32.4 pg (ref 26.0–34.0)
MCHC: 32.2 g/dL (ref 30.0–36.0)
MCV: 100.6 fL — ABNORMAL HIGH (ref 80.0–100.0)
Platelets: 87 10*3/uL — ABNORMAL LOW (ref 150–400)
RBC: 3.15 MIL/uL — ABNORMAL LOW (ref 4.22–5.81)
RDW: 17.8 % — ABNORMAL HIGH (ref 11.5–15.5)
WBC: 8.4 10*3/uL (ref 4.0–10.5)
nRBC: 0 % (ref 0.0–0.2)

## 2023-10-14 LAB — BASIC METABOLIC PANEL
Anion gap: 8 (ref 5–15)
BUN: 14 mg/dL (ref 8–23)
CO2: 29 mmol/L (ref 22–32)
Calcium: 9.1 mg/dL (ref 8.9–10.3)
Chloride: 104 mmol/L (ref 98–111)
Creatinine, Ser: 1.15 mg/dL (ref 0.61–1.24)
GFR, Estimated: >60 mL/min (ref >60)
Glucose, Bld: 87 mg/dL (ref 70–99)
Potassium: 4.2 mmol/L (ref 3.5–5.1)
Sodium: 141 mmol/L (ref 135–145)

## 2023-10-14 LAB — POCT I-STAT, CHEM 8
BUN: 17 mg/dL (ref 8–23)
Calcium, Ion: 1.16 mmol/L (ref 1.15–1.40)
Chloride: 103 mmol/L (ref 98–111)
Creatinine, Ser: 1.1 mg/dL (ref 0.61–1.24)
Glucose, Bld: 89 mg/dL (ref 70–99)
HCT: 26 % — ABNORMAL LOW (ref 39.0–52.0)
Hemoglobin: 8.8 g/dL — ABNORMAL LOW (ref 13.0–17.0)
Potassium: 3.5 mmol/L (ref 3.5–5.1)
Sodium: 140 mmol/L (ref 135–145)
TCO2: 25 mmol/L (ref 22–32)

## 2023-10-14 LAB — ECHOCARDIOGRAM COMPLETE
Area-P 1/2: 1.87 cm2
Height: 74 in
S' Lateral: 2.2 cm
Weight: 2479.73 [oz_av]

## 2023-10-14 LAB — HEPARIN LEVEL (UNFRACTIONATED): Heparin Unfractionated: 0.39 [IU]/mL (ref 0.30–0.70)

## 2023-10-14 SURGERY — PERIPHERAL VASCULAR THROMBECTOMY
Anesthesia: LOCAL | Laterality: Right

## 2023-10-14 MED ORDER — HEPARIN SODIUM (PORCINE) 1000 UNIT/ML IJ SOLN
INTRAMUSCULAR | Status: DC | PRN
  Administered 2023-10-14: 2000 [IU] via INTRAVENOUS
  Administered 2023-10-14: 4000 [IU] via INTRAVENOUS

## 2023-10-14 MED ORDER — FENTANYL CITRATE (PF) 100 MCG/2ML IJ SOLN
INTRAMUSCULAR | Status: DC | PRN
  Administered 2023-10-14: 50 ug via INTRAVENOUS
  Administered 2023-10-14 (×3): 25 ug via INTRAVENOUS

## 2023-10-14 MED ORDER — CHLORHEXIDINE GLUCONATE CLOTH 2 % EX PADS
6.0000 | MEDICATED_PAD | Freq: Every day | CUTANEOUS | Status: DC
  Administered 2023-10-14 – 2023-10-15 (×2): 6 via TOPICAL

## 2023-10-14 MED ORDER — MIDAZOLAM HCL 2 MG/2ML IJ SOLN
INTRAMUSCULAR | Status: AC
  Filled 2023-10-14: qty 2

## 2023-10-14 MED ORDER — SODIUM CHLORIDE 0.9 % IV SOLN
INTRAVENOUS | Status: AC | PRN
  Administered 2023-10-14: 10 mL/h via INTRAVENOUS

## 2023-10-14 MED ORDER — SODIUM CHLORIDE 0.9% FLUSH
3.0000 mL | Freq: Two times a day (BID) | INTRAVENOUS | Status: DC
  Administered 2023-10-15: 3 mL via INTRAVENOUS

## 2023-10-14 MED ORDER — MIDAZOLAM HCL 2 MG/2ML IJ SOLN
INTRAMUSCULAR | Status: DC | PRN
  Administered 2023-10-14 (×2): 1 mg via INTRAVENOUS

## 2023-10-14 MED ORDER — LABETALOL HCL 5 MG/ML IV SOLN: 10.0000 mg | INTRAVENOUS | Status: DC | PRN

## 2023-10-14 MED ORDER — SODIUM CHLORIDE 0.9% FLUSH: 3.0000 mL | INTRAVENOUS | Status: DC | PRN

## 2023-10-14 MED ORDER — SODIUM CHLORIDE 0.9 % IV SOLN: 250.0000 mL | INTRAVENOUS | Status: DC | PRN

## 2023-10-14 MED ORDER — HEPARIN (PORCINE) IN NACL 1000-0.9 UT/500ML-% IV SOLN
INTRAVENOUS | Status: DC | PRN
  Administered 2023-10-14: 500 mL

## 2023-10-14 MED ORDER — IODIXANOL 320 MG/ML IV SOLN
INTRAVENOUS | Status: DC | PRN
  Administered 2023-10-14: 110 mL via INTRAVENOUS

## 2023-10-14 MED ORDER — HYDRALAZINE HCL 20 MG/ML IJ SOLN: 5.0000 mg | INTRAMUSCULAR | Status: DC | PRN

## 2023-10-14 MED ORDER — HEPARIN (PORCINE) IN NACL 2000-0.9 UNIT/L-% IV SOLN
INTRAVENOUS | Status: DC | PRN
  Administered 2023-10-14: 1000 mL

## 2023-10-14 MED ORDER — HEPARIN SODIUM (PORCINE) 1000 UNIT/ML IJ SOLN
INTRAMUSCULAR | Status: AC
  Filled 2023-10-14: qty 10

## 2023-10-14 MED ORDER — HEPARIN BOLUS VIA INFUSION
2000.0000 [IU] | Freq: Once | INTRAVENOUS | Status: AC
  Administered 2023-10-14: 2000 [IU] via INTRAVENOUS
  Filled 2023-10-14: qty 2000

## 2023-10-14 MED ORDER — SODIUM CHLORIDE 0.9 % WEIGHT BASED INFUSION: 1.0000 mL/kg/h | INTRAVENOUS | Status: AC

## 2023-10-14 MED ORDER — ACETAMINOPHEN 325 MG PO TABS: 650.0000 mg | ORAL_TABLET | ORAL | Status: DC | PRN

## 2023-10-14 MED ORDER — FENTANYL CITRATE (PF) 100 MCG/2ML IJ SOLN
INTRAMUSCULAR | Status: AC
  Filled 2023-10-14: qty 2

## 2023-10-14 MED ORDER — LIDOCAINE HCL (PF) 1 % IJ SOLN
INTRAMUSCULAR | Status: AC
  Filled 2023-10-14: qty 30

## 2023-10-14 MED ORDER — SODIUM CHLORIDE 0.9 % IV SOLN: INTRAVENOUS | Status: DC

## 2023-10-14 MED ORDER — LIDOCAINE HCL (PF) 1 % IJ SOLN
INTRAMUSCULAR | Status: DC | PRN
  Administered 2023-10-14: 15 mL
  Administered 2023-10-14: 2 mL

## 2023-10-14 SURGICAL SUPPLY — 19 items
BALLN ATLAS 14X40X75 (BALLOONS) ×2
BALLN MUSTANG 12X60X135 (BALLOONS) ×2
BALLOON ATLAS 14X40X75 (BALLOONS) IMPLANT
BALLOON MUSTANG 12X60X135 (BALLOONS) IMPLANT
CANISTER PENUMBRA ENGINE (MISCELLANEOUS) IMPLANT
CATH ANGIO 5F BER2 100CM (CATHETERS) IMPLANT
CATH LIGHTNI FLASH 16XTORQ 100 (CATHETERS) IMPLANT
CATH OMNI FLUSH 5F 65CM (CATHETERS) IMPLANT
CATH VISIONS PV .035 IVUS (CATHETERS) IMPLANT
GLIDEWIRE ADV .035X260CM (WIRE) IMPLANT
KIT ENCORE 26 ADVANTAGE (KITS) IMPLANT
KIT MICROPUNCTURE NIT STIFF (SHEATH) IMPLANT
KIT SYRINGE INJ CVI SPIKEX1 (MISCELLANEOUS) IMPLANT
SET ATX-X65L (MISCELLANEOUS) IMPLANT
SHEATH DRYSEAL FLEX 16FR 33CM (SHEATH) IMPLANT
SHEATH PINNACLE 5F 10CM (SHEATH) IMPLANT
STENT VENOUS ABRE 16X60 (Permanent Stent) IMPLANT
TRAY PV CATH (CUSTOM PROCEDURE TRAY) IMPLANT
WIRE BENTSON .035X145CM (WIRE) IMPLANT

## 2023-10-14 NOTE — Progress Notes (Signed)
PHARMACY - ANTICOAGULATION CONSULT NOTE  Pharmacy Consult for heparin Indication: DVT  Allergies  Allergen Reactions   Crestor [Rosuvastatin] Other (See Comments)    unknown   Paroxetine Rash    Patient Measurements: Height: 6\' 2"  (188 cm) Weight: 70.3 kg (154 lb 15.7 oz) IBW/kg (Calculated) : 82.2 Heparin Dosing Weight: 75kg  Vital Signs: Temp: 98.1 F (36.7 C) (10/16 2017) Temp Source: Oral (10/16 2017) BP: 114/61 (10/16 2017) Pulse Rate: 80 (10/16 2017)  Labs: Recent Labs    10/13/23 1413  HGB 10.6*  HCT 33.4*  PLT 101*  CREATININE 1.18    Estimated Creatinine Clearance: 48 mL/min (by C-G formula based on SCr of 1.18 mg/dL).   Medical History: Past Medical History:  Diagnosis Date   Chronic kidney disease    stage 3   Colon polyps    Complication of anesthesia    difficulty urinating    Coronary artery disease    Enlarged prostate    Hyperlipidemia    Hypertension    Assessment: 82 year old male admitted to [facility] with lower extremity edema. Dopplers positive for right sided DVT and thrombus of the profundofemoral vein on the left. New orders to start IV heparin.  10/17 AM update:  Heparin level 0.16--not crossing into EPIC-[facility] transfer--showing in pharmacy BI report server  Goal of Therapy:  Heparin level 0.3-0.7 units/ml Monitor platelets by anticoagulation protocol: Yes   Plan:  Heparin 2000 units re-bolus Inc heparin to 1200 units/hr  8 hour heparin level  Abran Duke, PharmD, BCPS Clinical Pharmacist Phone: [phone]
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
## 2023-10-14 NOTE — Progress Notes (Addendum)
PHARMACY - ANTICOAGULATION CONSULT NOTE  Pharmacy Consult for heparin Indication: DVT  Allergies  Allergen Reactions   Crestor [Rosuvastatin] Other (See Comments)    unknown   Paroxetine Rash    Patient Measurements: Height: 6\' 2"  (188 cm) Weight: 70.3 kg (154 lb 15.7 oz) IBW/kg (Calculated) : 82.2 Heparin Dosing Weight: 75kg  Vital Signs: Temp: 97.8 F (36.6 C) (10/17 0852) Temp Source: Oral (10/17 0852) BP: 110/56 (10/17 0852) Pulse Rate: 74 (10/17 0852)  Labs: Recent Labs    10/13/23 1413 10/13/23 2316 10/14/23 0752  HGB 10.6*  --   --   HCT 33.4*  --   --   PLT 101*  --   --   HEPARINUNFRC  --  0.16* 0.39  CREATININE 1.18  --   --     Estimated Creatinine Clearance: 48 mL/min (by C-G formula based on SCr of 1.18 mg/dL).   Medical History: Past Medical History:  Diagnosis Date   Chronic kidney disease    stage 3   Colon polyps    Complication of anesthesia    difficulty urinating    Coronary artery disease    Enlarged prostate    Hyperlipidemia    Hypertension    Assessment: 82 year old male admitted to [facility] with lower extremity edema. Dopplers positive for right sided DVT and thrombus of the profundofemoral vein on the left. New orders to start IV heparin.  Heparin level therapeutic: 0.39, on 1200 units/hr. No issues with infusion or overt s/sx of bleeding reported.   Goal of Therapy:  Heparin level 0.3-0.7 units/ml Monitor platelets by anticoagulation protocol: Yes   Plan:  Continue heparin gtt at 1200 units/hr 8 hour heparin level then daily with CBC  Ruben Im, PharmD Clinical Pharmacist 10/14/2023 8:56 AM Please check AMION for all [facility] Pharmacy numbers   ________________________________________ Addendum:  Heparin turned off midday for procedure. Vascular has given verbal ok to resume now post procedure. Will resume at this mornings previously therapeutic rate and to reduce the number of patient lab collections, will get level  tomorrow with morning labs.   Ruben Im, PharmD Clinical Pharmacist 10/14/2023 3:10 PM Please check AMION for all [facility] Pharmacy numbers

## 2023-10-14 NOTE — Progress Notes (Signed)
Patient back from procedure.

## 2023-10-14 NOTE — Consult Note (Addendum)
Hospital Consult    Reason for Consult:  acute RLE DVT Requesting Physician:  Carma Leaven MRN #:  [id number]  History of Present Illness: This is a 82 y.o. male with a history of CAD status post PCI in 2005, HTN who is status post COVID pneumonia, discharged approximately 4 weeks ago.  He is presenting for acute right leg swelling that occurred over the last week.  He is found to have acute DVT involving the right common femoral, deep femoral, femoral popliteal and tibial veins.  He has baseline shortness of breath due to his recent COVID-pneumonia and is on 2 L of oxygen at home.  He is on dual antiplatelet for his coronary stent.  Past Medical History:  Diagnosis Date   Chronic kidney disease    stage 3   Colon polyps    Complication of anesthesia    difficulty urinating    Coronary artery disease    Enlarged prostate    Hyperlipidemia    Hypertension     Past Surgical History:  Procedure Laterality Date   acd fusion & plating     BACK SURGERY     x 6   CARDIAC CATHETERIZATION  2005   CHOLECYSTECTOMY N/A 07/19/2017   Procedure: LAPAROSCOPIC CHOLECYSTECTOMY;  Surgeon: Franky Macho, MD;  Location: [facility];  Service: General;  Laterality: N/A;   CORONARY STENT PLACEMENT  2005   HEMORRHOID SURGERY     HIP ARTHROPLASTY Left 06/29/2019   Procedure: ARTHROPLASTY BIPOLAR HIP (HEMIARTHROPLASTY);  Surgeon: Teryl Lucy, MD;  Location: [facility];  Service: Orthopedics;  Laterality: Left;   LAPAROSCOPIC APPENDECTOMY  04/17/2012   Procedure: APPENDECTOMY LAPAROSCOPIC;  Surgeon: Dalia Heading, MD;  Location: [facility];  Service: General;  Laterality: N/A;   TONSILLECTOMY      Allergies  Allergen Reactions   Crestor [Rosuvastatin] Other (See Comments)    unknown   Paroxetine Rash    Prior to Admission medications   Medication Sig Start Date End Date Taking? Authorizing Provider  aspirin EC 81 MG tablet Take 81 mg by mouth daily.   Yes [provider]  Cholecalciferol  (VITAMIN D HIGH POTENCY PO) Take 5,000 Units by mouth daily.   Yes [provider]  clopidogrel (PLAVIX) 75 MG tablet Take 75 mg by mouth daily. 1 Tablet Daily   Yes [provider]  Cyanocobalamin (VITAMIN B-12 IJ) Inject 1 mL as directed every 30 (thirty) days.    Yes [provider]  docusate sodium (COLACE) 100 MG capsule Take 1 capsule (100 mg total) by mouth 2 (two) times daily. Patient taking differently: Take 100 mg by mouth at bedtime. 08/26/23  Yes Lama, Sarina Ill, MD  ipratropium-albuterol (DUONEB) 0.5-2.5 (3) MG/3ML SOLN Take 3 mLs by nebulization 3 (three) times daily. 08/01/23  Yes Johnson, Clanford L, MD  nitroGLYCERIN (NITROSTAT) 0.4 MG SL tablet Place 0.4 mg under the tongue every 5 (five) minutes as needed. 06/23/17  Yes [provider]  oxycodone (ROXICODONE) 30 MG immediate release tablet Take 30 mg by mouth every 6 (six) hours. 07/23/23  Yes [provider]  polyethylene glycol (MIRALAX / GLYCOLAX) 17 g packet Take 17 g by mouth daily. 08/27/23  Yes Meredeth Ide, MD  predniSONE (DELTASONE) 20 MG tablet Take 2 tablets (40 mg total) by mouth daily with breakfast. Patient taking differently: Take 30 mg by mouth daily with breakfast. 08/27/23  Yes Meredeth Ide, MD    Social History   Socioeconomic History   Marital  status: Married    Spouse name: Not on file   Number of children: 2   Years of education: Not on file   Highest education level: Not on file  Occupational History   Occupation: retired  Tobacco Use   Smoking status: Former    Current packs/day: 0.00    Types: Cigarettes    Quit date: 07/16/1972    Years since quitting: 51.2   Smokeless tobacco: Never  Vaping Use   Vaping status: Never Used  Substance and Sexual Activity   Alcohol use: No   Drug use: No   Sexual activity: Never  Other Topics Concern   Not on file  Social History Narrative   Not on file   Social Determinants of Health   Financial Resource  Strain: Not on file  Food Insecurity: No Food Insecurity (08/13/2023)   Hunger Vital Sign    Worried About Running Out of Food in the Last Year: Never true    Ran Out of Food in the Last Year: Never true  Transportation Needs: No Transportation Needs (08/13/2023)   PRAPARE - Administrator, Civil Service (Medical): No    Lack of Transportation (Non-Medical): No  Physical Activity: Not on file  Stress: Not on file  Social Connections: Not on file  Intimate Partner Violence: Not At Risk (08/13/2023)   Humiliation, Afraid, Rape, and Kick questionnaire    Fear of Current or Ex-Partner: No    Emotionally Abused: No    Physically Abused: No    Sexually Abused: No    Family History  Problem Relation Age of Onset   Heart attack Mother    Tuberculosis Maternal Grandfather    Adrenal disorder Neg Hx    Colon cancer Neg Hx    Stomach cancer Neg Hx    Pancreatic cancer Neg Hx    Esophageal cancer Neg Hx     ROS: Otherwise negative unless mentioned in HPI  Physical Examination  Vitals:   10/13/23 2017 10/14/23 0425  BP: 114/61 (!) 109/57  Pulse: 80 61  Resp: 11 12  Temp: 98.1 F (36.7 C) 98.1 F (36.7 C)  SpO2: 97% 99%   Body mass index is 19.9 kg/m.  General: no acute distress Cardiac: hemodynamically stable, nontachycardic Pulm: normal work of breathing GI: non-tender, no pulsatile mass  Neuro: alert, no focal deficit Extremities: Right leg with 2+ pitting edema with tenderness to palpation, no wounds   Data:   Venous duplex On the right there is distended appearance of the leg venous system diffusely with heterogeneous material and poor flow extending from the common femoral to the deep femoral, femoral, popliteal and calf vessels consistent with the extensive DVT. On the left there is some echogenic material with poor flow along the deep femoral vein. The common femoral, femoral, popliteal and calf vessels show normal response to compression and flow on  color and spectral Doppler.    ASSESSMENT/PLAN: This is a 82 y.o. male with acute right lower extremity DVT involving the proximal common femoral, profunda, femoral, popliteal and tibial veins.  Risks and benefits of percutaneous thrombectomy were reviewed with the patient and his wife, they expressed understanding and are willing to proceed. Continue heparin drip. Will proceed to Cath Lab today for percutaneous thrombectomy Diagnosis of stage 3 CKD, Cr. 1.18 with eGFR > 60 yesterday. Regardless with be judicious with contrast usage.    Daria Pastures MD MS [facility] [phone] 10/14/2023  8:50 AM

## 2023-10-14 NOTE — Op Note (Addendum)
Patient name: Travis Woodward. MRN: [id number] DOB: 09-18-1941 Sex: male  10/14/2023 Pre-operative Diagnosis: Acute proximal DVT of right lower extremity Post-operative diagnosis:  Same Surgeon:  Daria Pastures, MD Procedure Performed: 1.  Ultrasound-guided access of left common femoral vein 2.  Ultrasound-guided access of the right posterior tibial vein 3.  Right leg venogram 4.  Mechanical thrombectomy of right external iliac vein, common femoral vein, deep femoral vein, femoral vein (16 French penumbra flash catheter) 5.  Balloon angioplasty of right femoral vein (12 x 60 Mustang balloon) 6.  Balloon angioplasty of right common iliac vein (12 x 60 Mustang balloon) 7.  Stenting of right common iliac vein with 16 x 60 abre stent 8.  Postdilation balloon angioplasty of R CIV with 14 x 40 Atlas balloon 9.  Intravascular ultrasound of right common iliac vein, external iliac vein and common femoral vein 10.  134 minutes conscious sedation   Indications: [name] is a 82 year old male with a history of CAD and recent COVID-pneumonia who presented with acute right lower extremity swelling and pain that occurred over the last week.  He was found to have DVT involving the proximal right common femoral vein, deep femoral vein, femoral vein, popliteal vein and tibial veins.  He was offered right lower extremity percutaneous mechanical thrombectomy in order to improve symptoms and decrease the risk of PTS, risks and benefits were reviewed with the patient and his wife, they expressed understanding were willing to proceed.  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   Postintervention: Widely patent right deep femoral vein, femoral vein, common femoral vein, external iliac vein and common iliac vein stent with brisk flow into the  IVC.   Procedure:  The patient was identified in the holding area and taken to the cath lab.  The patient was then placed supine on the table and prepped and draped in the usual sterile fashion.  A time out was called.  The left common femoral vein was accessed under ultrasound guidance and an cavogram was performed which did not demonstrate any clot in the cava.  A glide advantage wire and Omni Flush catheter were then used to traverse the caval bifurcation the wire was passed into the deep femoral vein and the access was upsized to a 16 [place] dry seal sheath terminating in the proximal common femoral vein on the right.  Given that he had been on a heparin drip an ACT was obtained which was 160, he was given a 4000 unit heparin bolus and ACT's were planned to be obtained every hour. I then obtained ultrasound guided access of the right posterior tibial vein and a Bern catheter was placed into the popliteal vein from this access.  From the posterior tibial vein access a right lower extremity venogram was obtained which demonstrated an occlusion of the right femoral vein with collateralization via the profunda vein and acute thrombus in the the proximal profunda and common femoral vein.  From the left groin access a glide advantage wire and Bern catheter were used to traverse the femoral vein occlusion and mechanical thrombectomy using 16 French penumbra flash catheter was performed.  Multiple passes through the common femoral and femoral vein were made yielding subacute and chronic thrombus.  Angiogram then demonstrated residual chronic thrombus in the mid femoral vein.  More passes  of the penumbra catheter were made yielding subsequent subacute and chronic thrombus although this lesion remained.  Balloon angioplasty of the lesion with a 12 x 60 Mustang balloon yielded adequate results with approximate 50% residual stenosis.  The glide advantage wire and Bern catheter were then used to cannulate the deep femoral  vein and angiogram demonstrated thrombus at the proximal aspect.  Mechanical thrombectomy with the upper number catheter was performed with resolution of the thrombus.  The 16 French sheath was pulled back to the IVC and venogram from the Bern catheter and the posterior tibial access was performed that demonstrated a lesion in the right common iliac vein.  An 035 IVUS catheter was then used to further evaluate this common iliac vein lesion.  A pullback from the right common femoral vein to the cava demonstrated compression of the common iliac vein causing severe stenosis with almost complete collapse of the vein around the catheter.  The 12 mm x 60 Mustang balloon was used for balloon angioplasty in order to potentially avoid stenting.  Venogram and IVUS demonstrated complete recoil and IVUS was used to determine appropriate stent sizing.  A 16 x 60 Abre stent was chosen and deployed across the lesion.  This was then postdilated with a 14 mm Atlas balloon.  Completion angiography demonstrated wide patency with brisk flow into the IVC.  The sheath in the posterior tibial vein was pulled and manual pressure was held.  A 3-0 nylon stitch was used to pursestring the left common femoral vein access in the left common femoral sheath was pulled and additional manual pressure was also held. The patient tolerated the procedure well and was brought to recovery in stable condition.  Blood loss: 500 cc, post intervention hemoglobin 8.8 Contrast: 120   Daria Pastures MD [facility] [place] Office: [phone]

## 2023-10-14 NOTE — Progress Notes (Signed)
  Echocardiogram 2D Echocardiogram has been performed.  Travis Woodward 10/14/2023, 3:13 PM

## 2023-10-15 ENCOUNTER — Ambulatory Visit: Payer: Medicare HMO | Admitting: Urology

## 2023-10-15 DIAGNOSIS — I82493 Acute embolism and thrombosis of other specified deep vein of lower extremity, bilateral: Secondary | ICD-10-CM | POA: Diagnosis not present

## 2023-10-15 DIAGNOSIS — N401 Enlarged prostate with lower urinary tract symptoms: Secondary | ICD-10-CM

## 2023-10-15 DIAGNOSIS — D696 Thrombocytopenia, unspecified: Secondary | ICD-10-CM | POA: Diagnosis not present

## 2023-10-15 DIAGNOSIS — I2510 Atherosclerotic heart disease of native coronary artery without angina pectoris: Secondary | ICD-10-CM | POA: Diagnosis not present

## 2023-10-15 DIAGNOSIS — K219 Gastro-esophageal reflux disease without esophagitis: Secondary | ICD-10-CM | POA: Diagnosis not present

## 2023-10-15 DIAGNOSIS — Z978 Presence of other specified devices: Secondary | ICD-10-CM

## 2023-10-15 LAB — BASIC METABOLIC PANEL
Anion gap: 8 (ref 5–15)
BUN: 19 mg/dL (ref 8–23)
CO2: 26 mmol/L (ref 22–32)
Calcium: 8.4 mg/dL — ABNORMAL LOW (ref 8.9–10.3)
Chloride: 102 mmol/L (ref 98–111)
Creatinine, Ser: 1.3 mg/dL — ABNORMAL HIGH (ref 0.61–1.24)
GFR, Estimated: 55 mL/min — ABNORMAL LOW (ref >60)
Glucose, Bld: 116 mg/dL — ABNORMAL HIGH (ref 70–99)
Potassium: 3.1 mmol/L — ABNORMAL LOW (ref 3.5–5.1)
Sodium: 136 mmol/L (ref 135–145)

## 2023-10-15 LAB — CBC
HCT: 24.5 % — ABNORMAL LOW (ref 39.0–52.0)
Hemoglobin: 7.9 g/dL — ABNORMAL LOW (ref 13.0–17.0)
MCH: 32 pg (ref 26.0–34.0)
MCHC: 32.2 g/dL (ref 30.0–36.0)
MCV: 99.2 fL (ref 80.0–100.0)
Platelets: 80 10*3/uL — ABNORMAL LOW (ref 150–400)
RBC: 2.47 MIL/uL — ABNORMAL LOW (ref 4.22–5.81)
RDW: 17.5 % — ABNORMAL HIGH (ref 11.5–15.5)
WBC: 10 10*3/uL (ref 4.0–10.5)
nRBC: 0 % (ref 0.0–0.2)

## 2023-10-15 LAB — HEPARIN LEVEL (UNFRACTIONATED): Heparin Unfractionated: 0.49 [IU]/mL (ref 0.30–0.70)

## 2023-10-15 MED ORDER — APIXABAN 5 MG PO TABS: ORAL_TABLET | ORAL | 0 refills | Status: DC

## 2023-10-15 MED ORDER — CLOPIDOGREL BISULFATE 75 MG PO TABS: 75.0000 mg | ORAL_TABLET | Freq: Every day | ORAL | 0 refills | Status: DC

## 2023-10-15 MED ORDER — APIXABAN 5 MG PO TABS: 5.0000 mg | ORAL_TABLET | Freq: Two times a day (BID) | ORAL | Status: DC

## 2023-10-15 MED ORDER — APIXABAN 5 MG PO TABS
10.0000 mg | ORAL_TABLET | Freq: Two times a day (BID) | ORAL | Status: DC
  Administered 2023-10-15: 10 mg via ORAL
  Filled 2023-10-15: qty 2
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
## 2023-10-15 NOTE — Progress Notes (Signed)
PHARMACY - ANTICOAGULATION CONSULT NOTE  Pharmacy Consult for heparin Indication: DVT  Allergies  Allergen Reactions   Crestor [Rosuvastatin] Other (See Comments)    unknown   Paroxetine Rash    Patient Measurements: Height: 6\' 2"  (188 cm) Weight: 70.3 kg (154 lb 15.7 oz) IBW/kg (Calculated) : 82.2 Heparin Dosing Weight: 75kg  Vital Signs: Temp: 97.8 F (36.6 C) (10/18 0502) Temp Source: Oral (10/18 0502) BP: 97/51 (10/18 0502) Pulse Rate: 91 (10/18 0502)  Labs: Recent Labs    10/13/23 1413 10/13/23 2316 10/14/23 0752 10/14/23 1202 10/15/23 0525  HGB 10.6*  --  10.2* 8.8*  --   HCT 33.4*  --  31.7* 26.0*  --   PLT 101*  --  87*  --   --   HEPARINUNFRC  --  0.16* 0.39  --  0.49  CREATININE 1.18  --  1.15 1.10  --     Estimated Creatinine Clearance: 51.5 mL/min (by C-G formula based on SCr of 1.1 mg/dL).   Medical History: Past Medical History:  Diagnosis Date   Chronic kidney disease    stage 3   Colon polyps    Complication of anesthesia    difficulty urinating    Coronary artery disease    Enlarged prostate    Hyperlipidemia    Hypertension    Assessment: 82 year old male admitted to [facility] with lower extremity edema. Dopplers positive for right sided DVT and thrombus of the profundofemoral vein on the left. New orders to start IV heparin.  Heparin level therapeutic this am, CBC pending.  Goal of Therapy:  Heparin level 0.3-0.7 units/ml Monitor platelets by anticoagulation protocol: Yes   Plan:  Continue heparin gtt at 1200 units/hr Daily heparin level and CBC  Fredonia Highland, PharmD, BCPS, [facility] Clinical Pharmacist Please check AMION for all [facility] Pharmacy numbers 10/15/2023

## 2023-10-15 NOTE — Progress Notes (Signed)
Knee High leg measurments, at Knee 13", Calf 12.5", Ankle 9"

## 2023-10-15 NOTE — Progress Notes (Addendum)
  Progress Note    10/15/2023 7:42 AM 1 Day Post-Op  Subjective:  R leg feels better   Vitals:   10/14/23 2356 10/15/23 0502  BP: (!) 90/49 (!) 97/51  Pulse: 71 91  Resp: 16 16  Temp: 97.7 F (36.5 C) 97.8 F (36.6 C)  SpO2: 96% 95%   Physical Exam Lungs:  non labored Incisions:  L groin without bleeding or hematoma Extremities:  Edema R leg; calf soft Neurologic: A&O  CBC    Component Value Date/Time   WBC 8.4 10/14/2023 0752   RBC 3.15 (L) 10/14/2023 0752   HGB 8.8 (L) 10/14/2023 1202   HCT 26.0 (L) 10/14/2023 1202   PLT 87 (L) 10/14/2023 0752   MCV 100.6 (H) 10/14/2023 0752   MCH 32.4 10/14/2023 0752   MCHC 32.2 10/14/2023 0752   RDW 17.8 (H) 10/14/2023 0752   LYMPHSABS 0.3 (L) 10/13/2023 1413   MONOABS 0.2 10/13/2023 1413   EOSABS 0.0 10/13/2023 1413   BASOSABS 0.0 10/13/2023 1413    BMET    Component Value Date/Time   NA 140 10/14/2023 1202   K 3.5 10/14/2023 1202   CL 103 10/14/2023 1202   CO2 29 10/14/2023 0752   GLUCOSE 89 10/14/2023 1202   BUN 17 10/14/2023 1202   CREATININE 1.10 10/14/2023 1202   CALCIUM 9.1 10/14/2023 0752   GFRNONAA >60 10/14/2023 0752   GFRAA >60 06/30/2019 0423    INR    Component Value Date/Time   INR 1.13 05/03/2012 1313     Intake/Output Summary (Last 24 hours) at 10/15/2023 0742 Last data filed at 10/15/2023 0532 Gross per 24 hour  Intake 950.18 ml  Output 675 ml  Net 275.18 ml     Assessment/Plan:  82 y.o. male is s/p RLE venous thrombectomy with CIV stenting 1 Day Post-Op   R LE less edematous this morning Continue triple therapy for at least one month with aspirin, plavix, and DOAC; can probably come off plavix in 1 month Order placed to fit for compression socks L groin nylon stitch can be removed later this afternoon   Emilie Rutter, PA-C [facility] [phone] 10/15/2023 7:42 AM   I agree with the above.  I have seen and evaluated patient.  He states his right leg  feels better today.  He currently has an Ace wrap over top of it.  He is having some nausea.  Plan is to transition to triple therapy for 1 month and then discontinue his Plavix.  He will need Eliquis for total of 3 months since this was a provoked DVT from COVID.  He will have compression socks placed.  He can follow-up in 1 month  [place] Nanako Stopher

## 2023-10-15 NOTE — Discharge Instructions (Signed)
Information on my medicine - ELIQUIS (apixaban)  This medication education was reviewed with me or my healthcare representative as part of my discharge preparation.   Why was Eliquis prescribed for you? Eliquis was prescribed to treat blood clots that may have been found in the veins of your legs (deep vein thrombosis) or in your lungs (pulmonary embolism) and to reduce the risk of them occurring again.  What do You need to know about Eliquis ? The starting dose is 10 mg (two 5 mg tablets) taken TWICE daily for the FIRST SEVEN (7) DAYS, then on 10/22/23  the dose is reduced to ONE 5 mg tablet taken TWICE daily.  Eliquis may be taken with or without food.   Try to take the dose about the same time in the morning and in the evening. If you have difficulty swallowing the tablet whole please discuss with your pharmacist how to take the medication safely.  Take Eliquis exactly as prescribed and DO NOT stop taking Eliquis without talking to the doctor who prescribed the medication.  Stopping may increase your risk of developing a new blood clot.  Refill your prescription before you run out.  After discharge, you should have regular check-up appointments with your healthcare provider that is prescribing your Eliquis.    What do you do if you miss a dose? If a dose of ELIQUIS is not taken at the scheduled time, take it as soon as possible on the same day and twice-daily administration should be resumed. The dose should not be doubled to make up for a missed dose.  Important Safety Information A possible side effect of Eliquis is bleeding. You should call your healthcare provider right away if you experience any of the following: Bleeding from an injury or your nose that does not stop. Unusual colored urine (red or dark brown) or unusual colored stools (red or black). Unusual bruising for unknown reasons. A serious fall or if you hit your head (even if there is no bleeding).  Some  medicines may interact with Eliquis and might increase your risk of bleeding or clotting while on Eliquis. To help avoid this, consult your healthcare provider or pharmacist prior to using any new prescription or non-prescription medications, including herbals, vitamins, non-steroidal anti-inflammatory drugs (NSAIDs) and supplements.  This website has more information on Eliquis (apixaban): http://www.eliquis.com/eliquis/home

## 2023-10-15 NOTE — Plan of Care (Signed)
  Problem: Education: Goal: Knowledge of risk factors and measures for prevention of condition will improve Outcome: Adequate for Discharge   Problem: Coping: Goal: Psychosocial and spiritual needs will be supported Outcome: Adequate for Discharge   Problem: Respiratory: Goal: Will maintain a patent airway Outcome: Adequate for Discharge Goal: Complications related to the disease process, condition or treatment will be avoided or minimized Outcome: Adequate for Discharge   Problem: Education: Goal: Knowledge of General Education information will improve Description: Including pain rating scale, medication(s)/side effects and non-pharmacologic comfort measures Outcome: Adequate for Discharge   Problem: Clinical Measurements: Goal: Ability to maintain clinical measurements within normal limits will improve Outcome: Adequate for Discharge Goal: Will remain free from infection Outcome: Adequate for Discharge Goal: Diagnostic test results will improve Outcome: Adequate for Discharge Goal: Respiratory complications will improve Outcome: Adequate for Discharge Goal: Cardiovascular complication will be avoided Outcome: Adequate for Discharge   Problem: Activity: Goal: Risk for activity intolerance will decrease Outcome: Adequate for Discharge   Problem: Pain Managment: Goal: General experience of comfort will improve Outcome: Adequate for Discharge   Problem: Education: Goal: Understanding of post-operative needs will improve Outcome: Adequate for Discharge Goal: Individualized Educational Video(s) Outcome: Adequate for Discharge   Problem: Activity: Goal: Ability to return to baseline activity level will improve Outcome: Adequate for Discharge   Problem: Cardiovascular: Goal: Ability to achieve and maintain adequate cardiovascular perfusion will improve Outcome: Adequate for Discharge Goal: Vascular access site(s) Level 0-1 will be maintained Outcome: Adequate for  Discharge

## 2023-10-15 NOTE — Progress Notes (Signed)
Pt stable for transition home today, new Eliquis started- 30 day trail coupon provided to take to pharmacy. No further TOC needs noted.     10/15/23 1525  TOC Brief Assessment  Insurance and Status Reviewed  Patient has primary care physician Yes  Home environment has been reviewed home  Prior level of function: self, has home 02 1-2L baseline  Prior/Current Home Services No current home services  Social Determinants of Health Reivew SDOH reviewed no interventions necessary  Readmission risk has been reviewed Yes  Transition of care needs no transition of care needs at this time

## 2023-10-15 NOTE — Progress Notes (Signed)
PHARMACY - ANTICOAGULATION CONSULT NOTE  Pharmacy Consult for Heparin > Eliquis Indication: DVT  Allergies  Allergen Reactions   Crestor [Rosuvastatin] Other (See Comments)    unknown   Paroxetine Rash    Patient Measurements: Height: 6\' 2"  (188 cm) Weight: 70.3 kg (154 lb 15.7 oz) IBW/kg (Calculated) : 82.2 Heparin Dosing Weight: 75 kg  Vital Signs: Temp: 97.9 F (36.6 C) (10/18 0904) Temp Source: Oral (10/18 0904) BP: 104/45 (10/18 0904) Pulse Rate: 95 (10/18 0904)  Labs: Recent Labs    10/13/23 1413 10/13/23 2316 10/14/23 0752 10/14/23 1202 10/15/23 0525  HGB 10.6*  --  10.2* 8.8*  --   HCT 33.4*  --  31.7* 26.0*  --   PLT 101*  --  87*  --   --   HEPARINUNFRC  --  0.16* 0.39  --  0.49  CREATININE 1.18  --  1.15 1.10  --     Estimated Creatinine Clearance: 51.5 mL/min (by C-G formula based on SCr of 1.1 mg/dL).  Assessment: 82 year old male admitted to [facility] ED with lower extremity edema. Dopplers positive for right sided DVT and thrombus of the profundofemoral vein on the left. IV heparin initiated on 10/13/23.  To transition from IV heparin to Eliquis. Heparin level therapeutic (0.49) on 1200 units/hr. Today's CBC is pending, platelet count low and trended down on 10/17. No bleeding reported.  Platelet counts have ranged 80s-130s for the last several years.  Also on Aspirin 81 mg and Plavix 75 mg daily, noted plan to continue for at least 1 month, then stop Plavix.  Goal of Therapy:  Heparin level 0.3-0.7 units/ml Appropriate Eliquis regimen for indication Monitor platelets by anticoagulation protocol: Yes   Plan:  Eliquis 10 mg PO BID x 1 week, then 5 mg PO BID. Stop IV heparin when giving first dose of Eliquis. Monitor for signs/symptoms of bleeding. Follow up CBC, low platelet counts.  Dennie Fetters, RPh 10/15/2023,9:50 AM

## 2023-10-16 NOTE — Plan of Care (Signed)
[facility] Tonsillectomy/Adenoidectomy, Postoperative PEDS care plan entered in error.

## 2023-10-18 ENCOUNTER — Encounter (HOSPITAL_COMMUNITY): Payer: Self-pay | Admitting: Vascular Surgery

## 2023-10-19 DIAGNOSIS — L89152 Pressure ulcer of sacral region, stage 2: Secondary | ICD-10-CM | POA: Diagnosis not present

## 2023-10-19 DIAGNOSIS — I2510 Atherosclerotic heart disease of native coronary artery without angina pectoris: Secondary | ICD-10-CM | POA: Diagnosis not present

## 2023-10-19 DIAGNOSIS — J9621 Acute and chronic respiratory failure with hypoxia: Secondary | ICD-10-CM | POA: Diagnosis not present

## 2023-10-19 DIAGNOSIS — I700 Atherosclerosis of aorta: Secondary | ICD-10-CM | POA: Diagnosis not present

## 2023-10-19 DIAGNOSIS — J479 Bronchiectasis, uncomplicated: Secondary | ICD-10-CM | POA: Diagnosis not present

## 2023-10-19 DIAGNOSIS — U099 Post covid-19 condition, unspecified: Secondary | ICD-10-CM | POA: Diagnosis not present

## 2023-10-19 DIAGNOSIS — I129 Hypertensive chronic kidney disease with stage 1 through stage 4 chronic kidney disease, or unspecified chronic kidney disease: Secondary | ICD-10-CM | POA: Diagnosis not present

## 2023-10-19 DIAGNOSIS — J8410 Pulmonary fibrosis, unspecified: Secondary | ICD-10-CM | POA: Diagnosis not present

## 2023-10-19 DIAGNOSIS — N189 Chronic kidney disease, unspecified: Secondary | ICD-10-CM | POA: Diagnosis not present

## 2023-10-19 NOTE — Progress Notes (Signed)
Name: Travis Woodward. DOB: 08-24-1941 MRN: [id number]  History of Present Illness: Travis Woodward is a 82 y.o. male who presents today as a new patient at [facility]. All available relevant medical records have been reviewed. He is accompanied by his wife Kathie Rhodes. - GU history:  1. BPH. - Previously seen by Dr. Vernie Ammons at [facility]. - Per visit note on 04/29/2011: "He has been seen and evaluated in the past by Dr. Wanda Plump and apparently,  has undergone a prostate biopsy that was negative." 2. Prior episodes of postoperative urinary retention.  He reports chief complaint of urinary retention.  Recent history:  > 10/10/2023:  - Seen in ER for difficulty voiding, mild dysuria, and sensation of incomplete bladder emptying.  - Urinalysis did not show evidence of UTI.  - Normal renal function (creatinine 1.14, GFR >60). - Bladder scan showed >700 ml of urine in his bladder. Foley catheter placed with subsequent resolution of his lower abdominal discomfort.   > 10/13/2023:  - Admitted for extensive bilateral lower extremity DVT.  - Underwent thrombectomy balloon angioplasty and stenting of multiple vessels on 10/14/2023.  - Discharged on Eliquis, Aspirin, and Plavix. - 10/15/2023: GFR 55; creatinine 1.30.  Today: He reports the catheter came out at home about 2 weeks ago; states it seems that the balloon was never fully inflated.   He saw his PCP (Dr. Sherwood Gambler) on Friday 10/22/2023 and was diagnosed with a UTI. He received an antibiotic shot in the office there and today received a prescription for oral antibiotics, which he has not yet started.   He reports weak urinary stream, dysuria, increased urinary frequency, nocturia 5+ times per night. Denies gross hematuria, urgency, urge incontinence, hesitancy, straining to void, or sensations of incomplete emptying. He denies flank pain or abdominal pain. He denies fevers, nausea, or vomiting.   Fall Screening: Do you  usually have a device to assist in your mobility? Yes - wheelchair  Medications: Current Outpatient Medications  Medication Sig Dispense Refill   tamsulosin (FLOMAX) 0.4 MG CAPS capsule Take 1 capsule (0.4 mg total) by mouth daily. 30 capsule 11   apixaban (ELIQUIS) 5 MG TABS tablet Take 2 tablets (10 mg total) by mouth 2 (two) times daily for 7 days, THEN 1 tablet (5 mg total) 2 (two) times daily for 21 days. 70 tablet 0   aspirin EC 81 MG tablet Take 81 mg by mouth daily.     Cholecalciferol (VITAMIN D HIGH POTENCY PO) Take 5,000 Units by mouth daily.     clopidogrel (PLAVIX) 75 MG tablet Take 1 tablet (75 mg total) by mouth daily. Stop this medication 11/11/23 unless otherwise specified by vascular surgery 28 tablet 0   Cyanocobalamin (VITAMIN B-12 IJ) Inject 1 mL as directed every 30 (thirty) days.      docusate sodium (COLACE) 100 MG capsule Take 1 capsule (100 mg total) by mouth 2 (two) times daily. (Patient taking differently: Take 100 mg by mouth at bedtime.) 30 capsule 0   ipratropium-albuterol (DUONEB) 0.5-2.5 (3) MG/3ML SOLN Take 3 mLs by nebulization 3 (three) times daily. 480 mL 2   nitroGLYCERIN (NITROSTAT) 0.4 MG SL tablet Place 0.4 mg under the tongue every 5 (five) minutes as needed.     oxycodone (ROXICODONE) 30 MG immediate release tablet Take 30 mg by mouth every 6 (six) hours.     polyethylene glycol (MIRALAX / GLYCOLAX) 17 g packet Take 17 g by mouth daily. 14 each 0  predniSONE (DELTASONE) 20 MG tablet Take 2 tablets (40 mg total) by mouth daily with breakfast. (Patient taking differently: Take 30 mg by mouth daily with breakfast.) 60 tablet 1   No current facility-administered medications for this visit.    Allergies: Allergies  Allergen Reactions   Crestor [Rosuvastatin] Other (See Comments)    unknown   Paroxetine Rash    Past Medical History:  Diagnosis Date   Chronic kidney disease    stage 3   Colon polyps    Complication of anesthesia    difficulty  urinating    Coronary artery disease    Enlarged prostate    Hyperlipidemia    Hypertension    Past Surgical History:  Procedure Laterality Date   acd fusion & plating     BACK SURGERY     x 6   CARDIAC CATHETERIZATION  2005   CHOLECYSTECTOMY N/A 07/19/2017   Procedure: LAPAROSCOPIC CHOLECYSTECTOMY;  Surgeon: Franky Macho, MD;  Location: [facility];  Service: General;  Laterality: N/A;   CORONARY STENT PLACEMENT  2005   HEMORRHOID SURGERY     HIP ARTHROPLASTY Left 06/29/2019   Procedure: ARTHROPLASTY BIPOLAR HIP (HEMIARTHROPLASTY);  Surgeon: Teryl Lucy, MD;  Location: [facility];  Service: Orthopedics;  Laterality: Left;   LAPAROSCOPIC APPENDECTOMY  04/17/2012   Procedure: APPENDECTOMY LAPAROSCOPIC;  Surgeon: Dalia Heading, MD;  Location: [facility];  Service: General;  Laterality: N/A;   PERIPHERAL VASCULAR INTERVENTION  10/14/2023   Procedure: PERIPHERAL VASCULAR INTERVENTION;  Surgeon: Daria Pastures, MD;  Location: [facility] INVASIVE CV LAB;  Service: Cardiovascular;;   PERIPHERAL VASCULAR THROMBECTOMY Right 10/14/2023   Procedure: PERIPHERAL VASCULAR THROMBECTOMY;  Surgeon: Daria Pastures, MD;  Location: [facility] INVASIVE CV LAB;  Service: Cardiovascular;  Laterality: Right;   PERIPHERAL VASCULAR ULTRASOUND/IVUS Right 10/14/2023   Procedure: Peripheral Vascular Ultrasound/IVUS;  Surgeon: Daria Pastures, MD;  Location: [facility] INVASIVE CV LAB;  Service: Cardiovascular;  Laterality: Right;   TONSILLECTOMY     Family History  Problem Relation Age of Onset   Heart attack Mother    Tuberculosis Maternal Grandfather    Adrenal disorder Neg Hx    Colon cancer Neg Hx    Stomach cancer Neg Hx    Pancreatic cancer Neg Hx    Esophageal cancer Neg Hx    Social History   Socioeconomic History   Marital status: Married    Spouse name: Not on file   Number of children: 2   Years of education: Not on file   Highest education level: Not on file  Occupational History   Occupation: retired  Tobacco Use    Smoking status: Former    Current packs/day: 0.00    Types: Cigarettes    Quit date: 07/16/1972    Years since quitting: 51.3   Smokeless tobacco: Never  Vaping Use   Vaping status: Never Used  Substance and Sexual Activity   Alcohol use: No   Drug use: No   Sexual activity: Never  Other Topics Concern   Not on file  Social History Narrative   Not on file   Social Determinants of Health   Financial Resource Strain: Not on file  Food Insecurity: No Food Insecurity (08/13/2023)   Hunger Vital Sign    Worried About Running Out of Food in the Last Year: Never true    Ran Out of Food in the Last Year: Never true  Transportation Needs: No Transportation Needs (08/13/2023)   PRAPARE - Transportation    Lack  of Transportation (Medical): No    Lack of Transportation (Non-Medical): No  Physical Activity: Not on file  Stress: Not on file  Social Connections: Not on file  Intimate Partner Violence: Not At Risk (08/13/2023)   Humiliation, Afraid, Rape, and Kick questionnaire    Fear of Current or Ex-Partner: No    Emotionally Abused: No    Physically Abused: No    Sexually Abused: No    SUBJECTIVE  Review of Systems Constitutional: Patient denies any unintentional weight loss or change in strength lntegumentary: Patient denies any rashes or pruritus Cardiovascular: Patient denies chest pain or syncope Respiratory: Patient denies shortness of breath Gastrointestinal: Patient denies nausea, vomiting, constipation, or diarrhea Musculoskeletal: Patient denies muscle cramps or weakness Neurologic: Patient denies convulsions or seizures Allergic/Immunologic: Patient denies recent allergic reaction(s) Hematologic/Lymphatic: Patient denies bleeding tendencies Endocrine: Patient denies heat/cold intolerance  GU: As per HPI.  OBJECTIVE Vitals:   10/25/23 1106  BP: 108/69  Pulse: 72  Temp: 98.1 F (36.7 C)   There is no height or weight on file to calculate BMI.  Physical  Examination Constitutional: No obvious distress; patient is non-toxic appearing  Cardiovascular: No visible lower extremity edema.  Respiratory: The patient does not have audible wheezing/stridor; respirations do not appear labored  Gastrointestinal: Abdomen non-distended Musculoskeletal: Normal ROM of UEs  Skin: No obvious rashes/open sores  Neurologic: CN 2-12 grossly intact Psychiatric: Answered questions appropriately with normal affect  Hematologic/Lymphatic/Immunologic: No obvious bruises or sites of spontaneous bleeding  UA: few bacteria PVR: 113 ml (before voiding)  ASSESSMENT Urinary retention - Plan: BLADDER SCAN AMB NON-IMAGING, tamsulosin (FLOMAX) 0.4 MG CAPS capsule  Foley catheter in place  Benign prostatic hyperplasia with incomplete bladder emptying - Plan: tamsulosin (FLOMAX) 0.4 MG CAPS capsule  UA is not highly concerning for acute UTI, however based on symptoms will send for urine culture. Will request recent records from PCP (Dr. Sherwood Gambler) pertaining to suspected UTI and antibiotic prescribed for that.   We discussed pt's history of urinary retention and possible etiologies including temporary detrusor areflexia postoperatively, neurogenic bladder, BPH, constipation, anticholinergic medication use. Advised patient to start Flomax 0.4 mg daily for BPH / BOO. Pt requested to be given a few catheters for clean intermittent catheterization (CIC) PRN, which were provided. We discussed option for cystoscopy at follow up visit for further evaluation / possible surgical consultation re: bladder outlet procedure such as TURP, Urolift, etc. Pt verbalized understanding and agreement to do that. All questions were answered.   PLAN Advised the following: Urine culture. Requesting recent records from PCP (Dr. Sherwood Gambler) pertaining to UTI. OK to take antibiotic as prescribed by PCP (Dr. Sherwood Gambler). Start Flomax 0.4 mg daily. Self-cath PRN. Return for 1st available cystoscopy with any  urology MD.  Orders Placed This Encounter  Procedures   BLADDER SCAN AMB NON-IMAGING    It has been explained that the patient is to follow regularly with their PCP in addition to all other providers involved in their care and to follow instructions provided by these respective offices. Patient advised to contact urology clinic if any urologic-pertaining questions, concerns, new symptoms or problems arise in the interim period.  There are no Patient Instructions on file for this visit.  Electronically signed by:  Donnita Falls, MSN, FNP-C, CUNP 10/25/2023 1:12 PM

## 2023-10-20 DIAGNOSIS — I129 Hypertensive chronic kidney disease with stage 1 through stage 4 chronic kidney disease, or unspecified chronic kidney disease: Secondary | ICD-10-CM | POA: Diagnosis not present

## 2023-10-20 DIAGNOSIS — I2510 Atherosclerotic heart disease of native coronary artery without angina pectoris: Secondary | ICD-10-CM | POA: Diagnosis not present

## 2023-10-20 DIAGNOSIS — L89152 Pressure ulcer of sacral region, stage 2: Secondary | ICD-10-CM | POA: Diagnosis not present

## 2023-10-20 DIAGNOSIS — N189 Chronic kidney disease, unspecified: Secondary | ICD-10-CM | POA: Diagnosis not present

## 2023-10-20 DIAGNOSIS — J8410 Pulmonary fibrosis, unspecified: Secondary | ICD-10-CM | POA: Diagnosis not present

## 2023-10-20 DIAGNOSIS — J9621 Acute and chronic respiratory failure with hypoxia: Secondary | ICD-10-CM | POA: Diagnosis not present

## 2023-10-20 DIAGNOSIS — J479 Bronchiectasis, uncomplicated: Secondary | ICD-10-CM | POA: Diagnosis not present

## 2023-10-20 DIAGNOSIS — I700 Atherosclerosis of aorta: Secondary | ICD-10-CM | POA: Diagnosis not present

## 2023-10-20 DIAGNOSIS — U099 Post covid-19 condition, unspecified: Secondary | ICD-10-CM | POA: Diagnosis not present

## 2023-10-22 DIAGNOSIS — G894 Chronic pain syndrome: Secondary | ICD-10-CM | POA: Diagnosis not present

## 2023-10-22 DIAGNOSIS — I82401 Acute embolism and thrombosis of unspecified deep veins of right lower extremity: Secondary | ICD-10-CM | POA: Diagnosis not present

## 2023-10-22 DIAGNOSIS — M1991 Primary osteoarthritis, unspecified site: Secondary | ICD-10-CM | POA: Diagnosis not present

## 2023-10-22 DIAGNOSIS — J8417 Interstitial lung disease with progressive fibrotic phenotype in diseases classified elsewhere: Secondary | ICD-10-CM | POA: Diagnosis not present

## 2023-10-22 DIAGNOSIS — N390 Urinary tract infection, site not specified: Secondary | ICD-10-CM | POA: Diagnosis not present

## 2023-10-22 DIAGNOSIS — J9611 Chronic respiratory failure with hypoxia: Secondary | ICD-10-CM | POA: Diagnosis not present

## 2023-10-22 DIAGNOSIS — E538 Deficiency of other specified B group vitamins: Secondary | ICD-10-CM | POA: Diagnosis not present

## 2023-10-22 DIAGNOSIS — J9621 Acute and chronic respiratory failure with hypoxia: Secondary | ICD-10-CM | POA: Diagnosis not present

## 2023-10-22 DIAGNOSIS — I10 Essential (primary) hypertension: Secondary | ICD-10-CM | POA: Diagnosis not present

## 2023-10-25 ENCOUNTER — Ambulatory Visit (INDEPENDENT_AMBULATORY_CARE_PROVIDER_SITE_OTHER): Payer: Medicare HMO | Admitting: Urology

## 2023-10-25 ENCOUNTER — Encounter: Payer: Self-pay | Admitting: Urology

## 2023-10-25 VITALS — BP 108/69 | HR 72 | Temp 98.1°F

## 2023-10-25 DIAGNOSIS — N401 Enlarged prostate with lower urinary tract symptoms: Secondary | ICD-10-CM

## 2023-10-25 DIAGNOSIS — Z978 Presence of other specified devices: Secondary | ICD-10-CM

## 2023-10-25 DIAGNOSIS — R82998 Other abnormal findings in urine: Secondary | ICD-10-CM | POA: Diagnosis not present

## 2023-10-25 DIAGNOSIS — R339 Retention of urine, unspecified: Secondary | ICD-10-CM

## 2023-10-25 LAB — URINALYSIS, ROUTINE W REFLEX MICROSCOPIC
Bilirubin, UA: NEGATIVE
Glucose, UA: NEGATIVE
Ketones, UA: NEGATIVE
Nitrite, UA: NEGATIVE
Protein,UA: NEGATIVE
RBC, UA: NEGATIVE
Specific Gravity, UA: 1.02 (ref 1.005–1.030)
Urobilinogen, Ur: 0.2 mg/dL (ref 0.2–1.0)
pH, UA: 6 (ref 5.0–7.5)

## 2023-10-25 LAB — MICROSCOPIC EXAMINATION

## 2023-10-25 LAB — BLADDER SCAN AMB NON-IMAGING: Scan Result: 113

## 2023-10-25 MED ORDER — TAMSULOSIN HCL 0.4 MG PO CAPS: 0.4000 mg | ORAL_CAPSULE | Freq: Every day | ORAL | 11 refills | Status: DC

## 2023-10-25 NOTE — Addendum Note (Signed)
Addended by: Gustavus Messing on: 10/25/2023 02:07 PM   Modules accepted: Orders

## 2023-10-25 NOTE — Progress Notes (Signed)
post void residual=113

## 2023-10-26 DIAGNOSIS — U099 Post covid-19 condition, unspecified: Secondary | ICD-10-CM | POA: Diagnosis not present

## 2023-10-26 DIAGNOSIS — J9621 Acute and chronic respiratory failure with hypoxia: Secondary | ICD-10-CM | POA: Diagnosis not present

## 2023-10-26 DIAGNOSIS — J479 Bronchiectasis, uncomplicated: Secondary | ICD-10-CM | POA: Diagnosis not present

## 2023-10-26 DIAGNOSIS — L89152 Pressure ulcer of sacral region, stage 2: Secondary | ICD-10-CM | POA: Diagnosis not present

## 2023-10-26 DIAGNOSIS — I700 Atherosclerosis of aorta: Secondary | ICD-10-CM | POA: Diagnosis not present

## 2023-10-26 DIAGNOSIS — I2510 Atherosclerotic heart disease of native coronary artery without angina pectoris: Secondary | ICD-10-CM | POA: Diagnosis not present

## 2023-10-26 DIAGNOSIS — N189 Chronic kidney disease, unspecified: Secondary | ICD-10-CM | POA: Diagnosis not present

## 2023-10-26 DIAGNOSIS — I129 Hypertensive chronic kidney disease with stage 1 through stage 4 chronic kidney disease, or unspecified chronic kidney disease: Secondary | ICD-10-CM | POA: Diagnosis not present

## 2023-10-26 DIAGNOSIS — J8410 Pulmonary fibrosis, unspecified: Secondary | ICD-10-CM | POA: Diagnosis not present

## 2023-10-27 ENCOUNTER — Telehealth: Payer: Self-pay

## 2023-10-27 DIAGNOSIS — U099 Post covid-19 condition, unspecified: Secondary | ICD-10-CM | POA: Diagnosis not present

## 2023-10-27 DIAGNOSIS — J479 Bronchiectasis, uncomplicated: Secondary | ICD-10-CM | POA: Diagnosis not present

## 2023-10-27 DIAGNOSIS — I700 Atherosclerosis of aorta: Secondary | ICD-10-CM | POA: Diagnosis not present

## 2023-10-27 DIAGNOSIS — I129 Hypertensive chronic kidney disease with stage 1 through stage 4 chronic kidney disease, or unspecified chronic kidney disease: Secondary | ICD-10-CM | POA: Diagnosis not present

## 2023-10-27 DIAGNOSIS — N189 Chronic kidney disease, unspecified: Secondary | ICD-10-CM | POA: Diagnosis not present

## 2023-10-27 DIAGNOSIS — L89152 Pressure ulcer of sacral region, stage 2: Secondary | ICD-10-CM | POA: Diagnosis not present

## 2023-10-27 DIAGNOSIS — J9621 Acute and chronic respiratory failure with hypoxia: Secondary | ICD-10-CM | POA: Diagnosis not present

## 2023-10-27 DIAGNOSIS — I2510 Atherosclerotic heart disease of native coronary artery without angina pectoris: Secondary | ICD-10-CM | POA: Diagnosis not present

## 2023-10-27 DIAGNOSIS — J8410 Pulmonary fibrosis, unspecified: Secondary | ICD-10-CM | POA: Diagnosis not present

## 2023-10-27 LAB — URINE CULTURE: Organism ID, Bacteria: NO GROWTH

## 2023-10-27 NOTE — Telephone Encounter (Signed)
-----   Message from Donnita Falls sent at 10/27/2023  8:42 AM EDT ----- Please notify patient: Negative urine culture, no antibiotic needed at this time. Thanks.

## 2023-10-27 NOTE — Telephone Encounter (Signed)
Unable to reach patient by phone, wife is aware of NP's response to urine culture and will notify patient.

## 2023-10-29 DIAGNOSIS — J8410 Pulmonary fibrosis, unspecified: Secondary | ICD-10-CM | POA: Diagnosis not present

## 2023-10-29 DIAGNOSIS — I2510 Atherosclerotic heart disease of native coronary artery without angina pectoris: Secondary | ICD-10-CM | POA: Diagnosis not present

## 2023-10-29 DIAGNOSIS — I700 Atherosclerosis of aorta: Secondary | ICD-10-CM | POA: Diagnosis not present

## 2023-10-29 DIAGNOSIS — N189 Chronic kidney disease, unspecified: Secondary | ICD-10-CM | POA: Diagnosis not present

## 2023-10-29 DIAGNOSIS — J479 Bronchiectasis, uncomplicated: Secondary | ICD-10-CM | POA: Diagnosis not present

## 2023-10-29 DIAGNOSIS — L89152 Pressure ulcer of sacral region, stage 2: Secondary | ICD-10-CM | POA: Diagnosis not present

## 2023-10-29 DIAGNOSIS — I129 Hypertensive chronic kidney disease with stage 1 through stage 4 chronic kidney disease, or unspecified chronic kidney disease: Secondary | ICD-10-CM | POA: Diagnosis not present

## 2023-10-29 DIAGNOSIS — U099 Post covid-19 condition, unspecified: Secondary | ICD-10-CM | POA: Diagnosis not present

## 2023-10-29 DIAGNOSIS — J9621 Acute and chronic respiratory failure with hypoxia: Secondary | ICD-10-CM | POA: Diagnosis not present

## 2023-11-01 DIAGNOSIS — I5030 Unspecified diastolic (congestive) heart failure: Secondary | ICD-10-CM | POA: Diagnosis not present

## 2023-11-01 DIAGNOSIS — I130 Hypertensive heart and chronic kidney disease with heart failure and stage 1 through stage 4 chronic kidney disease, or unspecified chronic kidney disease: Secondary | ICD-10-CM | POA: Diagnosis not present

## 2023-11-01 DIAGNOSIS — J8410 Pulmonary fibrosis, unspecified: Secondary | ICD-10-CM | POA: Diagnosis not present

## 2023-11-01 DIAGNOSIS — Z48812 Encounter for surgical aftercare following surgery on the circulatory system: Secondary | ICD-10-CM | POA: Diagnosis not present

## 2023-11-01 DIAGNOSIS — J9611 Chronic respiratory failure with hypoxia: Secondary | ICD-10-CM | POA: Diagnosis not present

## 2023-11-01 DIAGNOSIS — I82412 Acute embolism and thrombosis of left femoral vein: Secondary | ICD-10-CM | POA: Diagnosis not present

## 2023-11-01 DIAGNOSIS — L89152 Pressure ulcer of sacral region, stage 2: Secondary | ICD-10-CM | POA: Diagnosis not present

## 2023-11-01 DIAGNOSIS — U099 Post covid-19 condition, unspecified: Secondary | ICD-10-CM | POA: Diagnosis not present

## 2023-11-01 DIAGNOSIS — I82401 Acute embolism and thrombosis of unspecified deep veins of right lower extremity: Secondary | ICD-10-CM | POA: Diagnosis not present

## 2023-11-02 DIAGNOSIS — U099 Post covid-19 condition, unspecified: Secondary | ICD-10-CM | POA: Diagnosis not present

## 2023-11-02 DIAGNOSIS — I130 Hypertensive heart and chronic kidney disease with heart failure and stage 1 through stage 4 chronic kidney disease, or unspecified chronic kidney disease: Secondary | ICD-10-CM | POA: Diagnosis not present

## 2023-11-02 DIAGNOSIS — I82412 Acute embolism and thrombosis of left femoral vein: Secondary | ICD-10-CM | POA: Diagnosis not present

## 2023-11-02 DIAGNOSIS — L89152 Pressure ulcer of sacral region, stage 2: Secondary | ICD-10-CM | POA: Diagnosis not present

## 2023-11-02 DIAGNOSIS — J8410 Pulmonary fibrosis, unspecified: Secondary | ICD-10-CM | POA: Diagnosis not present

## 2023-11-02 DIAGNOSIS — Z48812 Encounter for surgical aftercare following surgery on the circulatory system: Secondary | ICD-10-CM | POA: Diagnosis not present

## 2023-11-02 DIAGNOSIS — I82401 Acute embolism and thrombosis of unspecified deep veins of right lower extremity: Secondary | ICD-10-CM | POA: Diagnosis not present

## 2023-11-02 DIAGNOSIS — I5030 Unspecified diastolic (congestive) heart failure: Secondary | ICD-10-CM | POA: Diagnosis not present

## 2023-11-02 DIAGNOSIS — J9611 Chronic respiratory failure with hypoxia: Secondary | ICD-10-CM | POA: Diagnosis not present

## 2023-11-04 DIAGNOSIS — I130 Hypertensive heart and chronic kidney disease with heart failure and stage 1 through stage 4 chronic kidney disease, or unspecified chronic kidney disease: Secondary | ICD-10-CM | POA: Diagnosis not present

## 2023-11-04 DIAGNOSIS — U099 Post covid-19 condition, unspecified: Secondary | ICD-10-CM | POA: Diagnosis not present

## 2023-11-04 DIAGNOSIS — I82412 Acute embolism and thrombosis of left femoral vein: Secondary | ICD-10-CM | POA: Diagnosis not present

## 2023-11-04 DIAGNOSIS — J9611 Chronic respiratory failure with hypoxia: Secondary | ICD-10-CM | POA: Diagnosis not present

## 2023-11-04 DIAGNOSIS — L89152 Pressure ulcer of sacral region, stage 2: Secondary | ICD-10-CM | POA: Diagnosis not present

## 2023-11-04 DIAGNOSIS — I82401 Acute embolism and thrombosis of unspecified deep veins of right lower extremity: Secondary | ICD-10-CM | POA: Diagnosis not present

## 2023-11-04 DIAGNOSIS — J8410 Pulmonary fibrosis, unspecified: Secondary | ICD-10-CM | POA: Diagnosis not present

## 2023-11-04 DIAGNOSIS — Z48812 Encounter for surgical aftercare following surgery on the circulatory system: Secondary | ICD-10-CM | POA: Diagnosis not present

## 2023-11-04 DIAGNOSIS — I5030 Unspecified diastolic (congestive) heart failure: Secondary | ICD-10-CM | POA: Diagnosis not present

## 2023-11-08 DIAGNOSIS — J9611 Chronic respiratory failure with hypoxia: Secondary | ICD-10-CM | POA: Diagnosis not present

## 2023-11-08 DIAGNOSIS — L89152 Pressure ulcer of sacral region, stage 2: Secondary | ICD-10-CM | POA: Diagnosis not present

## 2023-11-08 DIAGNOSIS — I82401 Acute embolism and thrombosis of unspecified deep veins of right lower extremity: Secondary | ICD-10-CM | POA: Diagnosis not present

## 2023-11-08 DIAGNOSIS — I5030 Unspecified diastolic (congestive) heart failure: Secondary | ICD-10-CM | POA: Diagnosis not present

## 2023-11-08 DIAGNOSIS — U099 Post covid-19 condition, unspecified: Secondary | ICD-10-CM | POA: Diagnosis not present

## 2023-11-08 DIAGNOSIS — Z48812 Encounter for surgical aftercare following surgery on the circulatory system: Secondary | ICD-10-CM | POA: Diagnosis not present

## 2023-11-08 DIAGNOSIS — I82412 Acute embolism and thrombosis of left femoral vein: Secondary | ICD-10-CM | POA: Diagnosis not present

## 2023-11-08 DIAGNOSIS — J8410 Pulmonary fibrosis, unspecified: Secondary | ICD-10-CM | POA: Diagnosis not present

## 2023-11-08 DIAGNOSIS — I130 Hypertensive heart and chronic kidney disease with heart failure and stage 1 through stage 4 chronic kidney disease, or unspecified chronic kidney disease: Secondary | ICD-10-CM | POA: Diagnosis not present

## 2023-11-09 DIAGNOSIS — J8410 Pulmonary fibrosis, unspecified: Secondary | ICD-10-CM | POA: Diagnosis not present

## 2023-11-09 DIAGNOSIS — L89152 Pressure ulcer of sacral region, stage 2: Secondary | ICD-10-CM | POA: Diagnosis not present

## 2023-11-09 DIAGNOSIS — U099 Post covid-19 condition, unspecified: Secondary | ICD-10-CM | POA: Diagnosis not present

## 2023-11-09 DIAGNOSIS — I130 Hypertensive heart and chronic kidney disease with heart failure and stage 1 through stage 4 chronic kidney disease, or unspecified chronic kidney disease: Secondary | ICD-10-CM | POA: Diagnosis not present

## 2023-11-09 DIAGNOSIS — I82412 Acute embolism and thrombosis of left femoral vein: Secondary | ICD-10-CM | POA: Diagnosis not present

## 2023-11-09 DIAGNOSIS — I5030 Unspecified diastolic (congestive) heart failure: Secondary | ICD-10-CM | POA: Diagnosis not present

## 2023-11-09 DIAGNOSIS — J9611 Chronic respiratory failure with hypoxia: Secondary | ICD-10-CM | POA: Diagnosis not present

## 2023-11-09 DIAGNOSIS — Z48812 Encounter for surgical aftercare following surgery on the circulatory system: Secondary | ICD-10-CM | POA: Diagnosis not present

## 2023-11-09 DIAGNOSIS — I82401 Acute embolism and thrombosis of unspecified deep veins of right lower extremity: Secondary | ICD-10-CM | POA: Diagnosis not present

## 2023-11-15 ENCOUNTER — Telehealth: Payer: Self-pay

## 2023-11-15 DIAGNOSIS — I5030 Unspecified diastolic (congestive) heart failure: Secondary | ICD-10-CM | POA: Diagnosis not present

## 2023-11-15 DIAGNOSIS — U099 Post covid-19 condition, unspecified: Secondary | ICD-10-CM | POA: Diagnosis not present

## 2023-11-15 DIAGNOSIS — I82401 Acute embolism and thrombosis of unspecified deep veins of right lower extremity: Secondary | ICD-10-CM | POA: Diagnosis not present

## 2023-11-15 DIAGNOSIS — J8410 Pulmonary fibrosis, unspecified: Secondary | ICD-10-CM | POA: Diagnosis not present

## 2023-11-15 DIAGNOSIS — Z48812 Encounter for surgical aftercare following surgery on the circulatory system: Secondary | ICD-10-CM | POA: Diagnosis not present

## 2023-11-15 DIAGNOSIS — J9611 Chronic respiratory failure with hypoxia: Secondary | ICD-10-CM | POA: Diagnosis not present

## 2023-11-15 DIAGNOSIS — I130 Hypertensive heart and chronic kidney disease with heart failure and stage 1 through stage 4 chronic kidney disease, or unspecified chronic kidney disease: Secondary | ICD-10-CM | POA: Diagnosis not present

## 2023-11-15 DIAGNOSIS — I82412 Acute embolism and thrombosis of left femoral vein: Secondary | ICD-10-CM | POA: Diagnosis not present

## 2023-11-15 DIAGNOSIS — L89152 Pressure ulcer of sacral region, stage 2: Secondary | ICD-10-CM | POA: Diagnosis not present

## 2023-11-15 NOTE — Telephone Encounter (Signed)
Pt's HH nurse Sarah, with [facility] called to clarify anticoagulants. All questions answered.

## 2023-11-17 DIAGNOSIS — J9611 Chronic respiratory failure with hypoxia: Secondary | ICD-10-CM | POA: Diagnosis not present

## 2023-11-17 DIAGNOSIS — G894 Chronic pain syndrome: Secondary | ICD-10-CM | POA: Diagnosis not present

## 2023-11-17 DIAGNOSIS — Z48812 Encounter for surgical aftercare following surgery on the circulatory system: Secondary | ICD-10-CM | POA: Diagnosis not present

## 2023-11-17 DIAGNOSIS — I5030 Unspecified diastolic (congestive) heart failure: Secondary | ICD-10-CM | POA: Diagnosis not present

## 2023-11-17 DIAGNOSIS — I82412 Acute embolism and thrombosis of left femoral vein: Secondary | ICD-10-CM | POA: Diagnosis not present

## 2023-11-17 DIAGNOSIS — L89152 Pressure ulcer of sacral region, stage 2: Secondary | ICD-10-CM | POA: Diagnosis not present

## 2023-11-17 DIAGNOSIS — U099 Post covid-19 condition, unspecified: Secondary | ICD-10-CM | POA: Diagnosis not present

## 2023-11-17 DIAGNOSIS — I82401 Acute embolism and thrombosis of unspecified deep veins of right lower extremity: Secondary | ICD-10-CM | POA: Diagnosis not present

## 2023-11-17 DIAGNOSIS — J8410 Pulmonary fibrosis, unspecified: Secondary | ICD-10-CM | POA: Diagnosis not present

## 2023-11-17 DIAGNOSIS — I130 Hypertensive heart and chronic kidney disease with heart failure and stage 1 through stage 4 chronic kidney disease, or unspecified chronic kidney disease: Secondary | ICD-10-CM | POA: Diagnosis not present

## 2023-11-19 ENCOUNTER — Other Ambulatory Visit: Payer: Self-pay

## 2023-11-19 DIAGNOSIS — I82493 Acute embolism and thrombosis of other specified deep vein of lower extremity, bilateral: Secondary | ICD-10-CM

## 2023-11-22 DIAGNOSIS — J8410 Pulmonary fibrosis, unspecified: Secondary | ICD-10-CM | POA: Diagnosis not present

## 2023-11-22 DIAGNOSIS — Z48812 Encounter for surgical aftercare following surgery on the circulatory system: Secondary | ICD-10-CM | POA: Diagnosis not present

## 2023-11-22 DIAGNOSIS — I130 Hypertensive heart and chronic kidney disease with heart failure and stage 1 through stage 4 chronic kidney disease, or unspecified chronic kidney disease: Secondary | ICD-10-CM | POA: Diagnosis not present

## 2023-11-22 DIAGNOSIS — I5030 Unspecified diastolic (congestive) heart failure: Secondary | ICD-10-CM | POA: Diagnosis not present

## 2023-11-22 DIAGNOSIS — I82412 Acute embolism and thrombosis of left femoral vein: Secondary | ICD-10-CM | POA: Diagnosis not present

## 2023-11-22 DIAGNOSIS — I82401 Acute embolism and thrombosis of unspecified deep veins of right lower extremity: Secondary | ICD-10-CM | POA: Diagnosis not present

## 2023-11-22 DIAGNOSIS — L89152 Pressure ulcer of sacral region, stage 2: Secondary | ICD-10-CM | POA: Diagnosis not present

## 2023-11-22 DIAGNOSIS — U099 Post covid-19 condition, unspecified: Secondary | ICD-10-CM | POA: Diagnosis not present

## 2023-11-22 DIAGNOSIS — J9611 Chronic respiratory failure with hypoxia: Secondary | ICD-10-CM | POA: Diagnosis not present

## 2023-11-23 DIAGNOSIS — I82412 Acute embolism and thrombosis of left femoral vein: Secondary | ICD-10-CM | POA: Diagnosis not present

## 2023-11-23 DIAGNOSIS — Z48812 Encounter for surgical aftercare following surgery on the circulatory system: Secondary | ICD-10-CM | POA: Diagnosis not present

## 2023-11-23 DIAGNOSIS — L89152 Pressure ulcer of sacral region, stage 2: Secondary | ICD-10-CM | POA: Diagnosis not present

## 2023-11-23 DIAGNOSIS — U099 Post covid-19 condition, unspecified: Secondary | ICD-10-CM | POA: Diagnosis not present

## 2023-11-23 DIAGNOSIS — J8410 Pulmonary fibrosis, unspecified: Secondary | ICD-10-CM | POA: Diagnosis not present

## 2023-11-23 DIAGNOSIS — I82401 Acute embolism and thrombosis of unspecified deep veins of right lower extremity: Secondary | ICD-10-CM | POA: Diagnosis not present

## 2023-11-23 DIAGNOSIS — J9611 Chronic respiratory failure with hypoxia: Secondary | ICD-10-CM | POA: Diagnosis not present

## 2023-11-23 DIAGNOSIS — I5030 Unspecified diastolic (congestive) heart failure: Secondary | ICD-10-CM | POA: Diagnosis not present

## 2023-11-23 DIAGNOSIS — I130 Hypertensive heart and chronic kidney disease with heart failure and stage 1 through stage 4 chronic kidney disease, or unspecified chronic kidney disease: Secondary | ICD-10-CM | POA: Diagnosis not present

## 2023-11-29 ENCOUNTER — Ambulatory Visit: Payer: Medicare HMO | Admitting: Urology

## 2023-11-29 VITALS — BP 131/69 | HR 106

## 2023-11-29 DIAGNOSIS — R3912 Poor urinary stream: Secondary | ICD-10-CM

## 2023-11-29 DIAGNOSIS — N401 Enlarged prostate with lower urinary tract symptoms: Secondary | ICD-10-CM | POA: Diagnosis not present

## 2023-11-29 DIAGNOSIS — N138 Other obstructive and reflux uropathy: Secondary | ICD-10-CM

## 2023-11-29 DIAGNOSIS — R339 Retention of urine, unspecified: Secondary | ICD-10-CM | POA: Diagnosis not present

## 2023-11-29 LAB — URINALYSIS, ROUTINE W REFLEX MICROSCOPIC
Bilirubin, UA: NEGATIVE
Glucose, UA: NEGATIVE
Ketones, UA: NEGATIVE
Nitrite, UA: POSITIVE — AB
Protein,UA: NEGATIVE
RBC, UA: NEGATIVE
Specific Gravity, UA: 1.025 (ref 1.005–1.030)
Urobilinogen, Ur: 1 mg/dL (ref 0.2–1.0)
pH, UA: 6.5 (ref 5.0–7.5)

## 2023-11-29 LAB — MICROSCOPIC EXAMINATION: WBC, UA: >30 /[HPF] — AB (ref 0–5)

## 2023-11-29 MED ORDER — FINASTERIDE 5 MG PO TABS: 5.0000 mg | ORAL_TABLET | Freq: Every day | ORAL | 3 refills | Status: DC

## 2023-11-29 NOTE — Progress Notes (Unsigned)
11/29/2023 1:44 PM   Travis Housekeeper Jr. November 22, 1941 [id number]  Referring provider: Elfredia Nevins, MD [address] [place],  [place] [address]  No chief complaint on file.   HPI:  F/u -   1) BPH -patient had a 120 g prostate on CT in 2019.  He has not been on therapy.  He has a history of postop urinary retention.  He went into urinary retention again in October 2024.  He started tamsulosin.  He passed a voiding trial.  Today, seen for the above.  He is voiding without the catheter. Doing well on tamsulosin.   The plan was to do cystoscopy today to consider other therapies even surgical, but patient was recently diagnosed with extensive DVT and is on Eliquis, aspirin and Plavix.   PMH: Past Medical History:  Diagnosis Date   Chronic kidney disease    stage 3   Colon polyps    Complication of anesthesia    difficulty urinating    Coronary artery disease    Enlarged prostate    Hyperlipidemia    Hypertension     Surgical History: Past Surgical History:  Procedure Laterality Date   acd fusion & plating     BACK SURGERY     x 6   CARDIAC CATHETERIZATION  2005   CHOLECYSTECTOMY N/A 07/19/2017   Procedure: LAPAROSCOPIC CHOLECYSTECTOMY;  Surgeon: Franky Macho, MD;  Location: [facility];  Service: General;  Laterality: N/A;   CORONARY STENT PLACEMENT  2005   HEMORRHOID SURGERY     HIP ARTHROPLASTY Left 06/29/2019   Procedure: ARTHROPLASTY BIPOLAR HIP (HEMIARTHROPLASTY);  Surgeon: Teryl Lucy, MD;  Location: [facility];  Service: Orthopedics;  Laterality: Left;   LAPAROSCOPIC APPENDECTOMY  04/17/2012   Procedure: APPENDECTOMY LAPAROSCOPIC;  Surgeon: Dalia Heading, MD;  Location: [facility];  Service: General;  Laterality: N/A;   PERIPHERAL VASCULAR INTERVENTION  10/14/2023   Procedure: PERIPHERAL VASCULAR INTERVENTION;  Surgeon: Daria Pastures, MD;  Location[facility] INVASIVE CV LAB;  Service: Cardiovascular;;   PERIPHERAL VASCULAR THROMBECTOMY Right 10/14/2023   Procedure:  PERIPHERAL VASCULAR THROMBECTOMY;  Surgeon: Daria Pastures, MD;  Location: [facility] INVASIVE CV LAB;  Service: Cardiovascular;  Laterality: Right;   PERIPHERAL VASCULAR ULTRASOUND/IVUS Right 10/14/2023   Procedure: Peripheral Vascular Ultrasound/IVUS;  Surgeon: Daria Pastures, MD;  Location: [facility] INVASIVE CV LAB;  Service: Cardiovascular;  Laterality: Right;   TONSILLECTOMY      Home Medications:  Allergies as of 11/29/2023       Reactions   Crestor [rosuvastatin] Other (See Comments)   unknown   Paroxetine Rash        Medication List        Accurate as of November 29, 2023  1:44 PM. If you have any questions, ask your nurse or doctor.          apixaban 5 MG Tabs tablet Commonly known as: ELIQUIS Take 2 tablets (10 mg total) by mouth 2 (two) times daily for 7 days, THEN 1 tablet (5 mg total) 2 (two) times daily for 21 days. Start taking on: October 15, 2023   aspirin EC 81 MG tablet Take 81 mg by mouth daily.   clopidogrel 75 MG tablet Commonly known as: PLAVIX Take 1 tablet (75 mg total) by mouth daily. Stop this medication 11/11/23 unless otherwise specified by vascular surgery   docusate sodium 100 MG capsule Commonly known as: COLACE Take 1 capsule (100 mg total) by mouth 2 (two) times daily. What changed: when to  take this   ipratropium-albuterol 0.5-2.5 (3) MG/3ML Soln Commonly known as: DUONEB Take 3 mLs by nebulization 3 (three) times daily.   nitroGLYCERIN 0.4 MG SL tablet Commonly known as: NITROSTAT Place 0.4 mg under the tongue every 5 (five) minutes as needed.   oxycodone 30 MG immediate release tablet Commonly known as: ROXICODONE Take 30 mg by mouth every 6 (six) hours.   polyethylene glycol 17 g packet Commonly known as: MIRALAX / GLYCOLAX Take 17 g by mouth daily.   predniSONE 20 MG tablet Commonly known as: DELTASONE Take 2 tablets (40 mg total) by mouth daily with breakfast. What changed: how much to take   tamsulosin 0.4 MG Caps  capsule Commonly known as: FLOMAX Take 1 capsule (0.4 mg total) by mouth daily.   VITAMIN B-12 IJ Inject 1 mL as directed every 30 (thirty) days.   VITAMIN D HIGH POTENCY PO Take 5,000 Units by mouth daily.        Allergies:  Allergies  Allergen Reactions   Crestor [Rosuvastatin] Other (See Comments)    unknown   Paroxetine Rash    Family History: Family History  Problem Relation Age of Onset   Heart attack Mother    Tuberculosis Maternal Grandfather    Adrenal disorder Neg Hx    Colon cancer Neg Hx    Stomach cancer Neg Hx    Pancreatic cancer Neg Hx    Esophageal cancer Neg Hx     Social History:  reports that he quit smoking about 51 years ago. His smoking use included cigarettes. He has never used smokeless tobacco. He reports that he does not drink alcohol and does not use drugs.   Physical Exam: There were no vitals taken for this visit.  Constitutional:  Alert and oriented, No acute distress. HEENT: [place] AT, moist mucus membranes.  Trachea midline, no masses. Cardiovascular: No clubbing, cyanosis, or edema. Respiratory: Normal respiratory effort, no increased work of breathing. GI: Abdomen is soft, nontender, nondistended, no abdominal masses GU: No CVA tenderness Skin: No rashes, bruises or suspicious lesions. Neurologic: Grossly intact, no focal deficits, moving all 4 extremities. Psychiatric: Normal mood and affect.  Laboratory Data: Lab Results  Component Value Date   WBC 10.0 10/15/2023   HGB 7.9 (L) 10/15/2023   HCT 24.5 (L) 10/15/2023   MCV 99.2 10/15/2023   PLT 80 (L) 10/15/2023    Lab Results  Component Value Date   CREATININE 1.30 (H) 10/15/2023    No results found for: "PSA"  No results found for: "TESTOSTERONE"  No results found for: "HGBA1C"  Urinalysis    Component Value Date/Time   COLORURINE YELLOW 10/11/2023 0140   APPEARANCEUR Clear 10/25/2023 1409   LABSPEC 1.006 10/11/2023 0140   PHURINE 7.0 10/11/2023 0140    GLUCOSEU Negative 10/25/2023 1409   HGBUR NEGATIVE 10/11/2023 0140   BILIRUBINUR Negative 10/25/2023 1409   KETONESUR NEGATIVE 10/11/2023 0140   PROTEINUR Negative 10/25/2023 1409   PROTEINUR NEGATIVE 10/11/2023 0140   UROBILINOGEN 0.2 05/03/2012 1559   NITRITE Negative 10/25/2023 1409   NITRITE NEGATIVE 10/11/2023 0140   LEUKOCYTESUR Trace (A) 10/25/2023 1409   LEUKOCYTESUR NEGATIVE 10/11/2023 0140    Lab Results  Component Value Date   LABMICR See below: 10/25/2023   WBCUA 0-5 10/25/2023   LABEPIT 0-10 10/25/2023   MUCUS Present (A) 10/25/2023   BACTERIA Few (A) 10/25/2023    Pertinent Imaging: CT-2019 images reviewed  No results found for this or any previous visit.  Results for orders  placed during the hospital encounter of 10/13/23  US Venous Img Lower Bilateral (DVT)  Narrative CLINICAL DATA:  Bilateral lower extremity edema for 2 weeks, right-greater-than-left. Patient is on Plavix  EXAM: Bilateral LOWER EXTREMITY VENOUS DOPPLER ULTRASOUND  TECHNIQUE: Gray-scale sonography with compression, as well as color and duplex ultrasound, were performed to evaluate the deep venous system(s) from the level of the common femoral vein through the popliteal and proximal calf veins.  COMPARISON:  None Available.  FINDINGS: VENOUS  On the right there is distended appearance of the leg venous system diffusely with heterogeneous material and poor flow extending from the common femoral to the deep femoral, femoral, popliteal and calf vessels consistent with the extensive DVT. On the left there is some echogenic material with poor flow along the deep femoral vein. The common femoral, femoral, popliteal and calf vessels show normal response to compression and flow on color and spectral Doppler.  OTHER  Critical Value/emergent results were called by telephone at the time of interpretation on 10/13/2023 at 8:20 am PST to a PA in the office of [facility] , who verbally  acknowledged these results.  Limitations: none  IMPRESSION: Extensive right lower extremity DVT. Thrombus involving the left profunda femoral vein.   Electronically Signed By: Karen Kays M.D. On: 10/13/2023 11:46  No results found for this or any previous visit.  No results found for this or any previous visit.  Results for orders placed during the hospital encounter of 05/18/17  US Renal  Narrative CLINICAL DATA:  82 year old male with chronic kidney disease stage 3. Initial encounter.  EXAM: RENAL / URINARY TRACT ULTRASOUND COMPLETE  COMPARISON:  07/25/2015 CT.  FINDINGS: Right Kidney:  Length: 10.2 cm. Mild renal parenchymal thinning and minimal increased echogenicity. No mass or hydronephrosis.  Left Kidney:  Length: 10.2 cm. Mild renal parenchymal thinning minimal increased echogenicity. No mass or hydronephrosis.  Bladder:  Appears normal for degree of bladder distention.  IMPRESSION: Mild renal parenchymal thinning and minimal increased echogenicity bilaterally without evidence of hydronephrosis or renal mass.   Electronically Signed By: Lacy Duverney M.D. On: 05/18/2017 13:51  No valid procedures specified. No results found for this or any previous visit.  No results found for this or any previous visit.   Assessment & Plan:    BPH, weak stream - pt on tamsulosin. We also discussed the nature r/b/a to 5ari.  We discussed 5 ARI has been shown to decrease the risk of urinary retention/foey catheter placement, gross hematuria and prostate surgery.  I recommended he start.  Also discussed FDA warnings among other risk with 5 ARI.  He will start.  A good surgical candidate given his extensive DVT risk and OAC use.  Consider PAE.  No follow-ups on file.  Jerilee Field, MD  [facility]  [address] [place], [place] [address] [phone]

## 2023-11-30 DIAGNOSIS — I130 Hypertensive heart and chronic kidney disease with heart failure and stage 1 through stage 4 chronic kidney disease, or unspecified chronic kidney disease: Secondary | ICD-10-CM | POA: Diagnosis not present

## 2023-11-30 DIAGNOSIS — I5030 Unspecified diastolic (congestive) heart failure: Secondary | ICD-10-CM | POA: Diagnosis not present

## 2023-11-30 DIAGNOSIS — L89152 Pressure ulcer of sacral region, stage 2: Secondary | ICD-10-CM | POA: Diagnosis not present

## 2023-11-30 DIAGNOSIS — I82401 Acute embolism and thrombosis of unspecified deep veins of right lower extremity: Secondary | ICD-10-CM | POA: Diagnosis not present

## 2023-11-30 DIAGNOSIS — Z48812 Encounter for surgical aftercare following surgery on the circulatory system: Secondary | ICD-10-CM | POA: Diagnosis not present

## 2023-11-30 DIAGNOSIS — J9611 Chronic respiratory failure with hypoxia: Secondary | ICD-10-CM | POA: Diagnosis not present

## 2023-11-30 DIAGNOSIS — I82412 Acute embolism and thrombosis of left femoral vein: Secondary | ICD-10-CM | POA: Diagnosis not present

## 2023-11-30 DIAGNOSIS — U099 Post covid-19 condition, unspecified: Secondary | ICD-10-CM | POA: Diagnosis not present

## 2023-11-30 DIAGNOSIS — J8410 Pulmonary fibrosis, unspecified: Secondary | ICD-10-CM | POA: Diagnosis not present

## 2023-12-01 NOTE — Progress Notes (Signed)
Patient ID: Travis Riding., male   DOB: 1941/09/01, 82 y.o.   MRN: [id number]  Reason for Consult: Routine Post Op   Referred by Elfredia Nevins, MD  Subjective:     HPI  Travis Toranzo. is a 82 y.o. male who presented in October with acute DVT of the right lower extremity.  He underwent mechanical thrombectomy of the right external iliac, common femoral, deep femoral and femoral vein with a 16 French penumbra as well as stenting of the right common iliac vein with a 16 x 40 Abre stent. Today he reports his right leg feels better, still has some mild to moderate swelling during the day but completely resolves with elevation.  He is compliant with compression stockings. He has stopped Plavix about 2 weeks ago but continues to be on aspirin and Eliquis.  Past Medical History:  Diagnosis Date   Chronic kidney disease    stage 3   Colon polyps    Complication of anesthesia    difficulty urinating    Coronary artery disease    Enlarged prostate    Hyperlipidemia    Hypertension    Family History  Problem Relation Age of Onset   Heart attack Mother    Tuberculosis Maternal Grandfather    Adrenal disorder Neg Hx    Colon cancer Neg Hx    Stomach cancer Neg Hx    Pancreatic cancer Neg Hx    Esophageal cancer Neg Hx    Past Surgical History:  Procedure Laterality Date   acd fusion & plating     BACK SURGERY     x 6   CARDIAC CATHETERIZATION  2005   CHOLECYSTECTOMY N/A 07/19/2017   Procedure: LAPAROSCOPIC CHOLECYSTECTOMY;  Surgeon: Franky Macho, MD;  Location: [facility];  Service: General;  Laterality: N/A;   CORONARY STENT PLACEMENT  2005   HEMORRHOID SURGERY     HIP ARTHROPLASTY Left 06/29/2019   Procedure: ARTHROPLASTY BIPOLAR HIP (HEMIARTHROPLASTY);  Surgeon: Teryl Lucy, MD;  Location: [facility];  Service: Orthopedics;  Laterality: Left;   LAPAROSCOPIC APPENDECTOMY  04/17/2012   Procedure: APPENDECTOMY LAPAROSCOPIC;  Surgeon: Dalia Heading, MD;  Location: [facility];   Service: General;  Laterality: N/A;   PERIPHERAL VASCULAR INTERVENTION  10/14/2023   Procedure: PERIPHERAL VASCULAR INTERVENTION;  Surgeon: Daria Pastures, MD;  Location: [facility] INVASIVE CV LAB;  Service: Cardiovascular;;   PERIPHERAL VASCULAR THROMBECTOMY Right 10/14/2023   Procedure: PERIPHERAL VASCULAR THROMBECTOMY;  Surgeon: Daria Pastures, MD;  Location: [facility] INVASIVE CV LAB;  Service: Cardiovascular;  Laterality: Right;   PERIPHERAL VASCULAR ULTRASOUND/IVUS Right 10/14/2023   Procedure: Peripheral Vascular Ultrasound/IVUS;  Surgeon: Daria Pastures, MD;  Location: [facility] INVASIVE CV LAB;  Service: Cardiovascular;  Laterality: Right;   TONSILLECTOMY      Short Social History:  Social History   Tobacco Use   Smoking status: Former    Current packs/day: 0.00    Types: Cigarettes    Quit date: 07/16/1972    Years since quitting: 51.4   Smokeless tobacco: Never  Substance Use Topics   Alcohol use: No    Allergies  Allergen Reactions   Crestor [Rosuvastatin] Other (See Comments)    unknown   Paroxetine Rash    Current Outpatient Medications  Medication Sig Dispense Refill   aspirin EC 81 MG tablet Take 81 mg by mouth daily.     Cholecalciferol (VITAMIN D HIGH POTENCY PO) Take 5,000 Units by mouth daily.     clopidogrel (  PLAVIX) 75 MG tablet Take 1 tablet (75 mg total) by mouth daily. Stop this medication 11/11/23 unless otherwise specified by vascular surgery 28 tablet 0   Cyanocobalamin (VITAMIN B-12 IJ) Inject 1 mL as directed every 30 (thirty) days.      docusate sodium (COLACE) 100 MG capsule Take 1 capsule (100 mg total) by mouth 2 (two) times daily. (Patient taking differently: Take 100 mg by mouth at bedtime.) 30 capsule 0   finasteride (PROSCAR) 5 MG tablet Take 1 tablet (5 mg total) by mouth daily. 90 tablet 3   ipratropium-albuterol (DUONEB) 0.5-2.5 (3) MG/3ML SOLN Take 3 mLs by nebulization 3 (three) times daily. 480 mL 2   nitroGLYCERIN (NITROSTAT) 0.4 MG SL tablet Place  0.4 mg under the tongue every 5 (five) minutes as needed.     oxycodone (ROXICODONE) 30 MG immediate release tablet Take 30 mg by mouth every 6 (six) hours.     polyethylene glycol (MIRALAX / GLYCOLAX) 17 g packet Take 17 g by mouth daily. 14 each 0   predniSONE (DELTASONE) 20 MG tablet Take 2 tablets (40 mg total) by mouth daily with breakfast. (Patient taking differently: Take 30 mg by mouth daily with breakfast.) 60 tablet 1   tamsulosin (FLOMAX) 0.4 MG CAPS capsule Take 1 capsule (0.4 mg total) by mouth daily. 30 capsule 11   apixaban (ELIQUIS) 5 MG TABS tablet Take 2 tablets (10 mg total) by mouth 2 (two) times daily for 7 days, THEN 1 tablet (5 mg total) 2 (two) times daily for 21 days. 70 tablet 0   No current facility-administered medications for this visit.    REVIEW OF SYSTEMS  Negative other than noted in HPI     Objective:  Objective   Vitals:   12/03/23 1001  BP: 110/65  Pulse: 90  Resp: 20  Temp: 97.9 F (36.6 C)  SpO2: 95%  Weight: 154 lb (69.9 kg)  Height: 6\' 2"  (1.88 m)   Body mass index is 19.77 kg/m.  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 is a 82 y.o. male who presented with acute right iliofemoral DVT and on October and underwent penumbra thrombectomy and right common iliac vein stenting.  Right common iliac vein stent is widely patent He is doing well and is wearing compression stockings.  Has some swelling towards the end of the day but resolves with elevation at night.  Continue aspirin and Eliquis Follow-up as needed     Daria Pastures MD [facility]

## 2023-12-03 ENCOUNTER — Ambulatory Visit (HOSPITAL_COMMUNITY)
Admission: RE | Admit: 2023-12-03 | Discharge: 2023-12-03 | Disposition: A | Discharge status: Home / Self Care | Payer: Medicare HMO | Source: Ambulatory Visit | Attending: Vascular Surgery | Admitting: Vascular Surgery

## 2023-12-03 ENCOUNTER — Ambulatory Visit (INDEPENDENT_AMBULATORY_CARE_PROVIDER_SITE_OTHER)
Admit: 2023-12-03 | Discharge: 2023-12-03 | Disposition: A | Discharge status: Home / Self Care | Payer: Medicare HMO | Attending: Vascular Surgery

## 2023-12-03 ENCOUNTER — Encounter: Payer: Self-pay | Admitting: Vascular Surgery

## 2023-12-03 ENCOUNTER — Encounter (HOSPITAL_COMMUNITY): Payer: Medicare HMO

## 2023-12-03 ENCOUNTER — Ambulatory Visit (INDEPENDENT_AMBULATORY_CARE_PROVIDER_SITE_OTHER): Payer: Medicare HMO | Admitting: Vascular Surgery

## 2023-12-03 VITALS — BP 110/65 | HR 90 | Temp 97.9°F | Resp 20 | Ht 74.0 in | Wt 154.0 lb

## 2023-12-03 DIAGNOSIS — I82493 Acute embolism and thrombosis of other specified deep vein of lower extremity, bilateral: Secondary | ICD-10-CM | POA: Insufficient documentation

## 2023-12-03 DIAGNOSIS — I82421 Acute embolism and thrombosis of right iliac vein: Secondary | ICD-10-CM | POA: Diagnosis not present

## 2023-12-06 DIAGNOSIS — Z48812 Encounter for surgical aftercare following surgery on the circulatory system: Secondary | ICD-10-CM | POA: Diagnosis not present

## 2023-12-06 DIAGNOSIS — I82412 Acute embolism and thrombosis of left femoral vein: Secondary | ICD-10-CM | POA: Diagnosis not present

## 2023-12-06 DIAGNOSIS — L89152 Pressure ulcer of sacral region, stage 2: Secondary | ICD-10-CM | POA: Diagnosis not present

## 2023-12-06 DIAGNOSIS — U099 Post covid-19 condition, unspecified: Secondary | ICD-10-CM | POA: Diagnosis not present

## 2023-12-06 DIAGNOSIS — J8410 Pulmonary fibrosis, unspecified: Secondary | ICD-10-CM | POA: Diagnosis not present

## 2023-12-06 DIAGNOSIS — J9611 Chronic respiratory failure with hypoxia: Secondary | ICD-10-CM | POA: Diagnosis not present

## 2023-12-06 DIAGNOSIS — I130 Hypertensive heart and chronic kidney disease with heart failure and stage 1 through stage 4 chronic kidney disease, or unspecified chronic kidney disease: Secondary | ICD-10-CM | POA: Diagnosis not present

## 2023-12-06 DIAGNOSIS — I5030 Unspecified diastolic (congestive) heart failure: Secondary | ICD-10-CM | POA: Diagnosis not present

## 2023-12-06 DIAGNOSIS — I82401 Acute embolism and thrombosis of unspecified deep veins of right lower extremity: Secondary | ICD-10-CM | POA: Diagnosis not present

## 2024-01-05 DIAGNOSIS — H5213 Myopia, bilateral: Secondary | ICD-10-CM | POA: Diagnosis not present

## 2024-01-14 DIAGNOSIS — I10 Essential (primary) hypertension: Secondary | ICD-10-CM | POA: Diagnosis not present

## 2024-01-14 DIAGNOSIS — J9611 Chronic respiratory failure with hypoxia: Secondary | ICD-10-CM | POA: Diagnosis not present

## 2024-01-14 DIAGNOSIS — E538 Deficiency of other specified B group vitamins: Secondary | ICD-10-CM | POA: Diagnosis not present

## 2024-01-14 DIAGNOSIS — Z0001 Encounter for general adult medical examination with abnormal findings: Secondary | ICD-10-CM | POA: Diagnosis not present

## 2024-01-14 DIAGNOSIS — G894 Chronic pain syndrome: Secondary | ICD-10-CM | POA: Diagnosis not present

## 2024-01-14 DIAGNOSIS — N183 Chronic kidney disease, stage 3 unspecified: Secondary | ICD-10-CM | POA: Diagnosis not present

## 2024-01-14 DIAGNOSIS — M1991 Primary osteoarthritis, unspecified site: Secondary | ICD-10-CM | POA: Diagnosis not present

## 2024-01-14 DIAGNOSIS — Z6822 Body mass index (BMI) 22.0-22.9, adult: Secondary | ICD-10-CM | POA: Diagnosis not present

## 2024-01-14 DIAGNOSIS — J8417 Interstitial lung disease with progressive fibrotic phenotype in diseases classified elsewhere: Secondary | ICD-10-CM | POA: Diagnosis not present

## 2024-01-14 DIAGNOSIS — I82401 Acute embolism and thrombosis of unspecified deep veins of right lower extremity: Secondary | ICD-10-CM | POA: Diagnosis not present

## 2024-01-14 DIAGNOSIS — Z1331 Encounter for screening for depression: Secondary | ICD-10-CM | POA: Diagnosis not present

## 2024-02-08 DIAGNOSIS — G894 Chronic pain syndrome: Secondary | ICD-10-CM | POA: Diagnosis not present

## 2024-02-08 DIAGNOSIS — J9611 Chronic respiratory failure with hypoxia: Secondary | ICD-10-CM | POA: Diagnosis not present

## 2024-02-08 DIAGNOSIS — M1991 Primary osteoarthritis, unspecified site: Secondary | ICD-10-CM | POA: Diagnosis not present

## 2024-02-08 DIAGNOSIS — N183 Chronic kidney disease, stage 3 unspecified: Secondary | ICD-10-CM | POA: Diagnosis not present

## 2024-03-28 DIAGNOSIS — E538 Deficiency of other specified B group vitamins: Secondary | ICD-10-CM | POA: Diagnosis not present

## 2024-04-11 ENCOUNTER — Other Ambulatory Visit

## 2024-05-24 DIAGNOSIS — N183 Chronic kidney disease, stage 3 unspecified: Secondary | ICD-10-CM | POA: Diagnosis not present

## 2024-05-24 DIAGNOSIS — J8410 Pulmonary fibrosis, unspecified: Secondary | ICD-10-CM | POA: Diagnosis not present

## 2024-05-24 DIAGNOSIS — G894 Chronic pain syndrome: Secondary | ICD-10-CM | POA: Diagnosis not present

## 2024-05-24 DIAGNOSIS — M1991 Primary osteoarthritis, unspecified site: Secondary | ICD-10-CM | POA: Diagnosis not present

## 2024-05-29 ENCOUNTER — Ambulatory Visit: Payer: Medicare HMO | Admitting: Urology

## 2024-06-06 DIAGNOSIS — G894 Chronic pain syndrome: Secondary | ICD-10-CM | POA: Diagnosis not present

## 2024-06-06 DIAGNOSIS — M1991 Primary osteoarthritis, unspecified site: Secondary | ICD-10-CM | POA: Diagnosis not present

## 2024-06-06 DIAGNOSIS — I10 Essential (primary) hypertension: Secondary | ICD-10-CM | POA: Diagnosis not present

## 2024-06-06 DIAGNOSIS — J8410 Pulmonary fibrosis, unspecified: Secondary | ICD-10-CM | POA: Diagnosis not present

## 2024-06-29 DIAGNOSIS — I10 Essential (primary) hypertension: Secondary | ICD-10-CM | POA: Diagnosis not present

## 2024-06-29 DIAGNOSIS — J8410 Pulmonary fibrosis, unspecified: Secondary | ICD-10-CM | POA: Diagnosis not present

## 2024-06-29 DIAGNOSIS — G894 Chronic pain syndrome: Secondary | ICD-10-CM | POA: Diagnosis not present

## 2024-06-29 DIAGNOSIS — D518 Other vitamin B12 deficiency anemias: Secondary | ICD-10-CM | POA: Diagnosis not present

## 2024-06-29 DIAGNOSIS — Z6821 Body mass index (BMI) 21.0-21.9, adult: Secondary | ICD-10-CM | POA: Diagnosis not present

## 2024-06-29 DIAGNOSIS — J9611 Chronic respiratory failure with hypoxia: Secondary | ICD-10-CM | POA: Diagnosis not present

## 2024-06-29 DIAGNOSIS — N183 Chronic kidney disease, stage 3 unspecified: Secondary | ICD-10-CM | POA: Diagnosis not present

## 2024-06-29 DIAGNOSIS — J8417 Interstitial lung disease with progressive fibrotic phenotype in diseases classified elsewhere: Secondary | ICD-10-CM | POA: Diagnosis not present

## 2024-06-29 DIAGNOSIS — M1991 Primary osteoarthritis, unspecified site: Secondary | ICD-10-CM | POA: Diagnosis not present

## 2024-07-26 DIAGNOSIS — Z6820 Body mass index (BMI) 20.0-20.9, adult: Secondary | ICD-10-CM | POA: Diagnosis not present

## 2024-07-26 DIAGNOSIS — M5432 Sciatica, left side: Secondary | ICD-10-CM | POA: Diagnosis not present

## 2024-07-26 DIAGNOSIS — I2510 Atherosclerotic heart disease of native coronary artery without angina pectoris: Secondary | ICD-10-CM | POA: Diagnosis not present

## 2024-07-26 DIAGNOSIS — I82409 Acute embolism and thrombosis of unspecified deep veins of unspecified lower extremity: Secondary | ICD-10-CM | POA: Diagnosis not present

## 2024-08-23 DIAGNOSIS — M1991 Primary osteoarthritis, unspecified site: Secondary | ICD-10-CM | POA: Diagnosis not present

## 2024-08-23 DIAGNOSIS — Z6822 Body mass index (BMI) 22.0-22.9, adult: Secondary | ICD-10-CM | POA: Diagnosis not present

## 2024-08-23 DIAGNOSIS — N183 Chronic kidney disease, stage 3 unspecified: Secondary | ICD-10-CM | POA: Diagnosis not present

## 2024-08-23 DIAGNOSIS — G894 Chronic pain syndrome: Secondary | ICD-10-CM | POA: Diagnosis not present

## 2024-08-23 DIAGNOSIS — I10 Essential (primary) hypertension: Secondary | ICD-10-CM | POA: Diagnosis not present

## 2024-08-23 DIAGNOSIS — J8410 Pulmonary fibrosis, unspecified: Secondary | ICD-10-CM | POA: Diagnosis not present

## 2024-08-23 DIAGNOSIS — E538 Deficiency of other specified B group vitamins: Secondary | ICD-10-CM | POA: Diagnosis not present

## 2024-09-26 DIAGNOSIS — I82409 Acute embolism and thrombosis of unspecified deep veins of unspecified lower extremity: Secondary | ICD-10-CM | POA: Diagnosis not present

## 2024-09-26 DIAGNOSIS — M5432 Sciatica, left side: Secondary | ICD-10-CM | POA: Diagnosis not present

## 2024-09-26 DIAGNOSIS — I2510 Atherosclerotic heart disease of native coronary artery without angina pectoris: Secondary | ICD-10-CM | POA: Diagnosis not present

## 2024-09-26 DIAGNOSIS — Z6821 Body mass index (BMI) 21.0-21.9, adult: Secondary | ICD-10-CM | POA: Diagnosis not present

## 2024-10-23 ENCOUNTER — Other Ambulatory Visit: Payer: Self-pay

## 2024-10-23 DIAGNOSIS — R339 Retention of urine, unspecified: Secondary | ICD-10-CM

## 2024-10-23 DIAGNOSIS — N401 Enlarged prostate with lower urinary tract symptoms: Secondary | ICD-10-CM

## 2024-10-24 ENCOUNTER — Encounter (HOSPITAL_COMMUNITY): Payer: Self-pay

## 2024-10-24 ENCOUNTER — Emergency Department (HOSPITAL_COMMUNITY)
Admission: EM | Admit: 2024-10-24 | Discharge: 2024-10-24 | Disposition: A | Discharge status: Home / Self Care | Attending: Emergency Medicine | Admitting: Emergency Medicine

## 2024-10-24 ENCOUNTER — Other Ambulatory Visit: Payer: Self-pay

## 2024-10-24 DIAGNOSIS — R319 Hematuria, unspecified: Secondary | ICD-10-CM | POA: Diagnosis present

## 2024-10-24 DIAGNOSIS — R339 Retention of urine, unspecified: Secondary | ICD-10-CM | POA: Insufficient documentation

## 2024-10-24 DIAGNOSIS — Z7901 Long term (current) use of anticoagulants: Secondary | ICD-10-CM | POA: Diagnosis not present

## 2024-10-24 DIAGNOSIS — Z7902 Long term (current) use of antithrombotics/antiplatelets: Secondary | ICD-10-CM | POA: Diagnosis not present

## 2024-10-24 DIAGNOSIS — Z7982 Long term (current) use of aspirin: Secondary | ICD-10-CM | POA: Insufficient documentation

## 2024-10-24 LAB — URINALYSIS, ROUTINE W REFLEX MICROSCOPIC
Bilirubin Urine: NEGATIVE
Glucose, UA: NEGATIVE mg/dL
Ketones, ur: NEGATIVE mg/dL
Leukocytes,Ua: NEGATIVE
Nitrite: NEGATIVE
Protein, ur: 100 mg/dL — AB
RBC / HPF: >50 RBC/hpf (ref 0–5)
Specific Gravity, Urine: 1.008 (ref 1.005–1.030)
pH: 6 (ref 5.0–8.0)

## 2024-10-24 MED ORDER — TAMSULOSIN HCL 0.4 MG PO CAPS: 0.4000 mg | ORAL_CAPSULE | Freq: Every day | ORAL | 0 refills | Status: DC

## 2024-10-24 MED ORDER — SODIUM CHLORIDE 0.9 % IR SOLN: 3000.0000 mL | Status: DC

## 2024-10-24 NOTE — ED Notes (Signed)
 ED Provider at bedside.

## 2024-10-24 NOTE — ED Notes (Signed)
 Leg bag applied.  Pt and spouse shown how to open and close to empty, they successfully demonstrated this to me

## 2024-10-24 NOTE — ED Notes (Signed)
 Irrigated Foley with 1L Sterile Water . EDP Notified. Urine drainage has cleared.

## 2024-10-24 NOTE — Discharge Instructions (Signed)
 Please follow-up closely with your urologist on an outpatient basis.  Return to emergency department immediately for any new or worsening symptoms.

## 2024-10-24 NOTE — ED Provider Notes (Signed)
 [place] EMERGENCY DEPARTMENT AT [facility] Provider Note   CSN: [id number] Arrival date & time: 10/24/24  1612     Patient presents with: Hematuria   Travis Woodward. is a 83 y.o. male.   Patient is an 83 year old male who is currently on Eliquis  who presents to the emergency department with a chief complaint of urinary retention.  Patient notes that he has been able to urinate since yesterday.  He did attempt to straight cath himself at home but notes that he got return of blood.  He notes that he is having suprapubic abdominal discomfort at this time.  He notes that he did not take his Eliquis  today.  He denies any associated fever, chills, flank pain.  He has had no other falls or associated blunt abdominal or pelvic trauma.   Hematuria       Prior to Admission medications   Medication Sig Start Date End Date Taking? Authorizing Provider  apixaban  (ELIQUIS ) 5 MG TABS tablet Take 2 tablets (10 mg total) by mouth 2 (two) times daily for 7 days, THEN 1 tablet (5 mg total) 2 (two) times daily for 21 days. 10/15/23 11/12/23  Lue Elsie BROCKS, MD  aspirin  EC 81 MG tablet Take 81 mg by mouth daily.    [provider]  Cholecalciferol  (VITAMIN D  HIGH POTENCY PO) Take 5,000 Units by mouth daily.    [provider]  clopidogrel  (PLAVIX ) 75 MG tablet Take 1 tablet (75 mg total) by mouth daily. Stop this medication 11/11/23 unless otherwise specified by vascular surgery 10/15/23   Lue Elsie BROCKS, MD  Cyanocobalamin (VITAMIN B-12 IJ) Inject 1 mL as directed every 30 (thirty) days.     [provider]  docusate sodium  (COLACE) 100 MG capsule Take 1 capsule (100 mg total) by mouth 2 (two) times daily. Patient taking differently: Take 100 mg by mouth at bedtime. 08/26/23   Drusilla Sabas RAMAN, MD  finasteride  (PROSCAR ) 5 MG tablet Take 1 tablet (5 mg total) by mouth daily. 11/29/23   Nieves Cough, MD  ipratropium-albuterol  (DUONEB) 0.5-2.5 (3)  MG/3ML SOLN Take 3 mLs by nebulization 3 (three) times daily. 08/01/23   Johnson, Clanford L, MD  nitroGLYCERIN  (NITROSTAT ) 0.4 MG SL tablet Place 0.4 mg under the tongue every 5 (five) minutes as needed. 06/23/17   [provider]  oxycodone  (ROXICODONE ) 30 MG immediate release tablet Take 30 mg by mouth every 6 (six) hours. 07/23/23   [provider]  polyethylene glycol (MIRALAX  / GLYCOLAX ) 17 g packet Take 17 g by mouth daily. 08/27/23   Drusilla Sabas RAMAN, MD  predniSONE  (DELTASONE ) 20 MG tablet Take 2 tablets (40 mg total) by mouth daily with breakfast. Patient taking differently: Take 30 mg by mouth daily with breakfast. 08/27/23   Drusilla Sabas RAMAN, MD  tamsulosin  (FLOMAX ) 0.4 MG CAPS capsule Take 1 capsule (0.4 mg total) by mouth daily. 10/25/23   Gerldine Lauraine BROCKS, FNP    Allergies: Crestor [rosuvastatin] and Paroxetine    Review of Systems  Genitourinary:  Positive for hematuria.  All other systems reviewed and are negative.   Updated Vital Signs BP (!) 141/70 (BP Location: Right Arm)   Pulse 85   Temp 98.1 F (36.7 C) (Oral)   Resp 18   Ht 6' 2 (1.88 m)   Wt 69.9 kg   SpO2 99%   BMI 19.79 kg/m   Physical Exam Vitals and nursing note reviewed.  Constitutional:  General: He is not in acute distress.    Appearance: Normal appearance. He is not ill-appearing.  HENT:     Head: Normocephalic and atraumatic.     Nose: Nose normal.     Mouth/Throat:     Mouth: Mucous membranes are moist.  Eyes:     Extraocular Movements: Extraocular movements intact.     Conjunctiva/sclera: Conjunctivae normal.     Pupils: Pupils are equal, round, and reactive to light.  Cardiovascular:     Rate and Rhythm: Normal rate and regular rhythm.     Pulses: Normal pulses.     Heart sounds: Normal heart sounds. No murmur heard.    No gallop.  Pulmonary:     Effort: Pulmonary effort is normal. No respiratory distress.     Breath sounds: Normal breath sounds. No stridor. No wheezing,  rhonchi or rales.  Abdominal:     General: Abdomen is flat. Bowel sounds are normal. There is no distension.     Palpations: Abdomen is soft.     Tenderness: There is no abdominal tenderness. There is no guarding.  Musculoskeletal:        General: Normal range of motion.     Cervical back: Normal range of motion and neck supple. No rigidity or tenderness.  Skin:    General: Skin is warm and dry.  Neurological:     General: No focal deficit present.     Mental Status: He is alert and oriented to person, place, and time. Mental status is at baseline.  Psychiatric:        Mood and Affect: Mood normal.        Behavior: Behavior normal.        Thought Content: Thought content normal.        Judgment: Judgment normal.     (all labs ordered are listed, but only abnormal results are displayed) Labs Reviewed  URINALYSIS, ROUTINE W REFLEX MICROSCOPIC    EKG: None  Radiology: No results found.   Procedures   Medications Ordered in the ED  sodium chloride  irrigation 0.9 % 3,000 mL (has no administration in time range)                                    Medical Decision Making Patient is doing very well at this time and is stable for discharge home.  Urine has cleared with irrigation of the bladder in the emergency department and do not suspect that he needs continuous bladder irrigation.  Patient has no indication of urinary tract infection at this point with urinalysis but will still send urine for culture.  Vital signs are otherwise stable with no indication for sepsis.  Pain has completely resolved with Foley catheter placement.  Do not suspect any further workup is warranted on emergent basis.  Will refill his Flomax  for him.  Foley catheter will remain in place and he will follow-up closely with his urologist.  Strict turn precautions were discussed for any new or worsening symptoms.  Patient voiced understanding and had no additional questions.  Amount and/or Complexity of Data  Reviewed Labs: ordered.  Risk Prescription drug management.        Final diagnoses:  None    ED Discharge Orders     None          Daralene Lonni JONETTA DEVONNA 10/24/24 1803    Towana Ozell BROCKS, MD 10/25/24 [phone]

## 2024-10-24 NOTE — ED Triage Notes (Signed)
 Pt arrived via POV c/o urinary retention and believes his prostate swells up after he eats certain foods. Pt recently spoke with his urologist and was given straight catheters to empty his bladder at home, but now reports hematuria.

## 2024-10-25 LAB — URINE CULTURE: Culture: NO GROWTH

## 2024-10-28 ENCOUNTER — Emergency Department (HOSPITAL_COMMUNITY)

## 2024-10-28 ENCOUNTER — Inpatient Hospital Stay (HOSPITAL_COMMUNITY)
Admission: EM | Admit: 2024-10-28 | Discharge: 2024-10-31 | DRG: 663 | Disposition: A | Discharge status: Home / Self Care | Attending: Internal Medicine | Admitting: Internal Medicine

## 2024-10-28 ENCOUNTER — Other Ambulatory Visit: Payer: Self-pay

## 2024-10-28 DIAGNOSIS — R310 Gross hematuria: Secondary | ICD-10-CM | POA: Diagnosis not present

## 2024-10-28 DIAGNOSIS — I82593 Chronic embolism and thrombosis of other specified deep vein of lower extremity, bilateral: Secondary | ICD-10-CM | POA: Diagnosis not present

## 2024-10-28 DIAGNOSIS — M545 Low back pain, unspecified: Secondary | ICD-10-CM | POA: Diagnosis present

## 2024-10-28 DIAGNOSIS — R58 Hemorrhage, not elsewhere classified: Secondary | ICD-10-CM | POA: Diagnosis not present

## 2024-10-28 DIAGNOSIS — D696 Thrombocytopenia, unspecified: Secondary | ICD-10-CM | POA: Diagnosis present

## 2024-10-28 DIAGNOSIS — Z87891 Personal history of nicotine dependence: Secondary | ICD-10-CM | POA: Diagnosis not present

## 2024-10-28 DIAGNOSIS — Z86718 Personal history of other venous thrombosis and embolism: Secondary | ICD-10-CM | POA: Diagnosis not present

## 2024-10-28 DIAGNOSIS — R338 Other retention of urine: Secondary | ICD-10-CM | POA: Diagnosis present

## 2024-10-28 DIAGNOSIS — Z8249 Family history of ischemic heart disease and other diseases of the circulatory system: Secondary | ICD-10-CM

## 2024-10-28 DIAGNOSIS — N1831 Chronic kidney disease, stage 3a: Secondary | ICD-10-CM | POA: Diagnosis present

## 2024-10-28 DIAGNOSIS — Z7982 Long term (current) use of aspirin: Secondary | ICD-10-CM | POA: Diagnosis not present

## 2024-10-28 DIAGNOSIS — R319 Hematuria, unspecified: Secondary | ICD-10-CM | POA: Diagnosis present

## 2024-10-28 DIAGNOSIS — Z888 Allergy status to other drugs, medicaments and biological substances status: Secondary | ICD-10-CM | POA: Diagnosis not present

## 2024-10-28 DIAGNOSIS — D62 Acute posthemorrhagic anemia: Secondary | ICD-10-CM | POA: Diagnosis present

## 2024-10-28 DIAGNOSIS — N401 Enlarged prostate with lower urinary tract symptoms: Secondary | ICD-10-CM | POA: Diagnosis present

## 2024-10-28 DIAGNOSIS — Z23 Encounter for immunization: Secondary | ICD-10-CM | POA: Diagnosis present

## 2024-10-28 DIAGNOSIS — I10 Essential (primary) hypertension: Secondary | ICD-10-CM | POA: Diagnosis not present

## 2024-10-28 DIAGNOSIS — Z96642 Presence of left artificial hip joint: Secondary | ICD-10-CM | POA: Diagnosis present

## 2024-10-28 DIAGNOSIS — Z79899 Other long term (current) drug therapy: Secondary | ICD-10-CM

## 2024-10-28 DIAGNOSIS — I2510 Atherosclerotic heart disease of native coronary artery without angina pectoris: Secondary | ICD-10-CM | POA: Diagnosis present

## 2024-10-28 DIAGNOSIS — G8929 Other chronic pain: Secondary | ICD-10-CM | POA: Diagnosis present

## 2024-10-28 DIAGNOSIS — I82493 Acute embolism and thrombosis of other specified deep vein of lower extremity, bilateral: Secondary | ICD-10-CM | POA: Diagnosis not present

## 2024-10-28 DIAGNOSIS — Z7401 Bed confinement status: Secondary | ICD-10-CM | POA: Diagnosis not present

## 2024-10-28 DIAGNOSIS — I959 Hypotension, unspecified: Secondary | ICD-10-CM | POA: Diagnosis not present

## 2024-10-28 DIAGNOSIS — I82409 Acute embolism and thrombosis of unspecified deep veins of unspecified lower extremity: Secondary | ICD-10-CM | POA: Diagnosis present

## 2024-10-28 DIAGNOSIS — Z7902 Long term (current) use of antithrombotics/antiplatelets: Secondary | ICD-10-CM

## 2024-10-28 DIAGNOSIS — I129 Hypertensive chronic kidney disease with stage 1 through stage 4 chronic kidney disease, or unspecified chronic kidney disease: Secondary | ICD-10-CM | POA: Diagnosis present

## 2024-10-28 DIAGNOSIS — E785 Hyperlipidemia, unspecified: Secondary | ICD-10-CM | POA: Diagnosis present

## 2024-10-28 DIAGNOSIS — Z7901 Long term (current) use of anticoagulants: Secondary | ICD-10-CM

## 2024-10-28 DIAGNOSIS — N3289 Other specified disorders of bladder: Secondary | ICD-10-CM | POA: Diagnosis not present

## 2024-10-28 DIAGNOSIS — K219 Gastro-esophageal reflux disease without esophagitis: Secondary | ICD-10-CM | POA: Diagnosis present

## 2024-10-28 DIAGNOSIS — Z955 Presence of coronary angioplasty implant and graft: Secondary | ICD-10-CM

## 2024-10-28 DIAGNOSIS — R531 Weakness: Secondary | ICD-10-CM | POA: Diagnosis not present

## 2024-10-28 LAB — CBC WITH DIFFERENTIAL/PLATELET
Abs Immature Granulocytes: 0.01 K/uL (ref 0.00–0.07)
Basophils Absolute: 0.1 K/uL (ref 0.0–0.1)
Basophils Relative: 2 %
Eosinophils Absolute: 0.1 K/uL (ref 0.0–0.5)
Eosinophils Relative: 2 %
HCT: 33.7 % — ABNORMAL LOW (ref 39.0–52.0)
Hemoglobin: 10.9 g/dL — ABNORMAL LOW (ref 13.0–17.0)
Immature Granulocytes: 0 %
Lymphocytes Relative: 14 %
Lymphs Abs: 0.9 K/uL (ref 0.7–4.0)
MCH: 30.4 pg (ref 26.0–34.0)
MCHC: 32.3 g/dL (ref 30.0–36.0)
MCV: 93.9 fL (ref 80.0–100.0)
Monocytes Absolute: 0.6 K/uL (ref 0.1–1.0)
Monocytes Relative: 9 %
Neutro Abs: 4.7 K/uL (ref 1.7–7.7)
Neutrophils Relative %: 73 %
Platelets: 105 K/uL — ABNORMAL LOW (ref 150–400)
RBC: 3.59 MIL/uL — ABNORMAL LOW (ref 4.22–5.81)
RDW: 15.8 % — ABNORMAL HIGH (ref 11.5–15.5)
WBC: 6.4 K/uL (ref 4.0–10.5)
nRBC: 0 % (ref 0.0–0.2)

## 2024-10-28 LAB — CBC
HCT: 33.8 % — ABNORMAL LOW (ref 39.0–52.0)
Hemoglobin: 10.6 g/dL — ABNORMAL LOW (ref 13.0–17.0)
MCH: 29.4 pg (ref 26.0–34.0)
MCHC: 31.4 g/dL (ref 30.0–36.0)
MCV: 93.9 fL (ref 80.0–100.0)
Platelets: 119 K/uL — ABNORMAL LOW (ref 150–400)
RBC: 3.6 MIL/uL — ABNORMAL LOW (ref 4.22–5.81)
RDW: 15.5 % (ref 11.5–15.5)
WBC: 8.5 K/uL (ref 4.0–10.5)
nRBC: 0 % (ref 0.0–0.2)

## 2024-10-28 LAB — PROTIME-INR
INR: 1.2 (ref 0.8–1.2)
Prothrombin Time: 15.6 s — ABNORMAL HIGH (ref 11.4–15.2)

## 2024-10-28 LAB — URINALYSIS, ROUTINE W REFLEX MICROSCOPIC
Bacteria, UA: NONE SEEN
Bilirubin Urine: NEGATIVE
Glucose, UA: NEGATIVE mg/dL
Ketones, ur: NEGATIVE mg/dL
Leukocytes,Ua: NEGATIVE
Nitrite: NEGATIVE
Protein, ur: 30 mg/dL — AB
RBC / HPF: >50 RBC/hpf (ref 0–5)
Specific Gravity, Urine: 1.004 — ABNORMAL LOW (ref 1.005–1.030)
WBC, UA: >50 WBC/hpf (ref 0–5)
pH: 6 (ref 5.0–8.0)

## 2024-10-28 LAB — COMPREHENSIVE METABOLIC PANEL WITH GFR
ALT: 8 U/L (ref 0–44)
AST: 12 U/L — ABNORMAL LOW (ref 15–41)
Albumin: 4 g/dL (ref 3.5–5.0)
Alkaline Phosphatase: 107 U/L (ref 38–126)
Anion gap: 10 (ref 5–15)
BUN: 21 mg/dL (ref 8–23)
CO2: 26 mmol/L (ref 22–32)
Calcium: 9.2 mg/dL (ref 8.9–10.3)
Chloride: 102 mmol/L (ref 98–111)
Creatinine, Ser: 1.28 mg/dL — ABNORMAL HIGH (ref 0.61–1.24)
GFR, Estimated: 56 mL/min — ABNORMAL LOW (ref >60)
Glucose, Bld: 109 mg/dL — ABNORMAL HIGH (ref 70–99)
Potassium: 4.2 mmol/L (ref 3.5–5.1)
Sodium: 138 mmol/L (ref 135–145)
Total Bilirubin: 0.6 mg/dL (ref 0.0–1.2)
Total Protein: 6.4 g/dL — ABNORMAL LOW (ref 6.5–8.1)

## 2024-10-28 LAB — TYPE AND SCREEN
ABO/RH(D): O POS
Antibody Screen: NEGATIVE

## 2024-10-28 MED ORDER — INFLUENZA VAC SPLIT HIGH-DOSE 0.5 ML IM SUSY
0.5000 mL | PREFILLED_SYRINGE | INTRAMUSCULAR | Status: AC
  Administered 2024-10-30: 0.5 mL via INTRAMUSCULAR
  Filled 2024-10-28: qty 0.5

## 2024-10-28 MED ORDER — DOCUSATE SODIUM 100 MG PO CAPS
100.0000 mg | ORAL_CAPSULE | Freq: Every day | ORAL | Status: DC
  Administered 2024-10-28 – 2024-10-30 (×3): 100 mg via ORAL
  Filled 2024-10-28 (×3): qty 1

## 2024-10-28 MED ORDER — POLYETHYLENE GLYCOL 3350 17 G PO PACK
17.0000 g | PACK | Freq: Every day | ORAL | Status: DC
  Administered 2024-10-28 – 2024-10-30 (×3): 17 g via ORAL
  Filled 2024-10-28 (×3): qty 1

## 2024-10-28 MED ORDER — ONDANSETRON HCL 4 MG/2ML IJ SOLN: 4.0000 mg | Freq: Four times a day (QID) | INTRAMUSCULAR | Status: DC | PRN

## 2024-10-28 MED ORDER — SODIUM CHLORIDE 0.9 % IR SOLN
3000.0000 mL | Status: DC
  Administered 2024-10-28 – 2024-10-29 (×20): 3000 mL

## 2024-10-28 MED ORDER — TAMSULOSIN HCL 0.4 MG PO CAPS
0.4000 mg | ORAL_CAPSULE | Freq: Every day | ORAL | Status: DC
  Administered 2024-10-29 – 2024-10-31 (×3): 0.4 mg via ORAL
  Filled 2024-10-28 (×3): qty 1

## 2024-10-28 MED ORDER — ONDANSETRON HCL 4 MG PO TABS: 4.0000 mg | ORAL_TABLET | Freq: Four times a day (QID) | ORAL | Status: DC | PRN

## 2024-10-28 NOTE — H&P (Signed)
 History and Physical    Patient: Travis Woodward. FMW:[id number] DOB: Aug 03, 1941 DOA: 10/28/2024 DOS: the patient was seen and examined on 10/28/2024 PCP: Orpha Yancey LABOR, MD  Patient coming from: Home  Chief Complaint:  Chief Complaint  Patient presents with   Hematuria   HPI: Travis Woodward. is a 83 y.o. male with medical history significant of HTN, stage 3 CKD, BPH, who presents with hematuria.  He presented in the ER on 10/28 with difficulty urinating.  He attempted to straight cath, but just got frank blood.  He was having a little bit of suprapubic abdominal discomfort but no pain.  His bladder was irrigated and it appeared that his hematuria had stopped.  He was discharged to home with Foley catheter in place to follow-up with urology.  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  He woke up this morning with frank blood in his urine and return to the hospital for evaluation.  Here, irrigation was attempted, but the hematuria did not stop.  Urology was consulted who recommended that the patient be admitted down to [facility] long and that they would evaluate him. No fevers, chills, nausea, vomiting, chest pain, dizziness, lightheadedness.  Review of Systems: As mentioned in the history of present illness. All other systems reviewed and are negative. Past Medical History:  Diagnosis Date   Chronic kidney disease    stage 3   Colon polyps    Complication of anesthesia    difficulty urinating    Coronary artery disease    Enlarged prostate    Hyperlipidemia    Hypertension    Past Surgical History:  Procedure Laterality Date   acd fusion & plating     BACK SURGERY     x 6   CARDIAC CATHETERIZATION  2005   CHOLECYSTECTOMY N/A 07/19/2017   Procedure: LAPAROSCOPIC CHOLECYSTECTOMY;  Surgeon: Mavis Anes, MD;  Location: [facility];  Service: General;  Laterality: N/A;   CORONARY STENT PLACEMENT  2005   HEMORRHOID SURGERY     HIP ARTHROPLASTY Left  06/29/2019   Procedure: ARTHROPLASTY BIPOLAR HIP (HEMIARTHROPLASTY);  Surgeon: Josefina Chew, MD;  Location: [facility];  Service: Orthopedics;  Laterality: Left;   LAPAROSCOPIC APPENDECTOMY  04/17/2012   Procedure: APPENDECTOMY LAPAROSCOPIC;  Surgeon: Anes LABOR Mavis, MD;  Location: [facility];  Service: General;  Laterality: N/A;   PERIPHERAL VASCULAR INTERVENTION  10/14/2023   Procedure: PERIPHERAL VASCULAR INTERVENTION;  Surgeon: Pearline Norman RAMAN, MD;  Location: [facility] INVASIVE CV LAB;  Service: Cardiovascular;;   PERIPHERAL VASCULAR THROMBECTOMY Right 10/14/2023   Procedure: PERIPHERAL VASCULAR THROMBECTOMY;  Surgeon: Pearline Norman RAMAN, MD;  Location: [facility] INVASIVE CV LAB;  Service: Cardiovascular;  Laterality: Right;   PERIPHERAL VASCULAR ULTRASOUND/IVUS Right 10/14/2023   Procedure: Peripheral Vascular Ultrasound/IVUS;  Surgeon: Pearline Norman RAMAN, MD;  Location: [facility] INVASIVE CV LAB;  Service: Cardiovascular;  Laterality: Right;   TONSILLECTOMY     Social History:  reports that he quit smoking about 52 years ago. His smoking use included cigarettes. He has been exposed to tobacco smoke. He has never used smokeless tobacco. He reports that he does not drink alcohol and does not use drugs.  Allergies  Allergen Reactions   Crestor [Rosuvastatin] Other (See Comments)    Unknown    Paroxetine Rash    Family History  Problem Relation Age of Onset   Heart attack Mother    Tuberculosis Maternal Grandfather    Adrenal disorder  Neg Hx    Colon cancer Neg Hx    Stomach cancer Neg Hx    Pancreatic cancer Neg Hx    Esophageal cancer Neg Hx     Prior to Admission medications   Medication Sig Start Date End Date Taking? Authorizing Provider  apixaban  (ELIQUIS ) 5 MG TABS tablet Take 2 tablets (10 mg total) by mouth 2 (two) times daily for 7 days, THEN 1 tablet (5 mg total) 2 (two) times daily for 21 days. 10/15/23 10/28/24 Yes Lue Elsie BROCKS, MD  aspirin  EC 81 MG tablet Take 81 mg by mouth daily.   Yes  [provider]  Cholecalciferol  (VITAMIN D  HIGH POTENCY PO) Take 5,000 Units by mouth daily.   Yes [provider]  clopidogrel  (PLAVIX ) 75 MG tablet Take 1 tablet (75 mg total) by mouth daily. Stop this medication 11/11/23 unless otherwise specified by vascular surgery 10/15/23  Yes Lue Elsie BROCKS, MD  Cyanocobalamin (VITAMIN B-12 IJ) Inject 1 mL as directed every 30 (thirty) days.    Yes [provider]  docusate sodium  (COLACE) 100 MG capsule Take 1 capsule (100 mg total) by mouth 2 (two) times daily. Patient taking differently: Take 100 mg by mouth at bedtime. 08/26/23  Yes Drusilla Sabas RAMAN, MD  nitroGLYCERIN  (NITROSTAT ) 0.4 MG SL tablet Place 0.4 mg under the tongue every 5 (five) minutes as needed for chest pain. 06/23/17  Yes [provider]  oxyCODONE  (ROXICODONE ) 15 MG immediate release tablet Take 15 mg by mouth every 8 (eight) hours as needed for pain. 10/03/24  Yes [provider]  polyethylene glycol (MIRALAX  / GLYCOLAX ) 17 g packet Take 17 g by mouth daily. Patient taking differently: Take 17 g by mouth at bedtime. 08/27/23  Yes Drusilla Sabas RAMAN, MD  tamsulosin  (FLOMAX ) 0.4 MG CAPS capsule Take 1 capsule (0.4 mg total) by mouth daily. 10/24/24  Yes Daralene Lonni BIRCH, PA-C    Physical Exam: Vitals:   10/28/24 1330 10/28/24 1400 10/28/24 1415 10/28/24 1653  BP: (!) 146/71 (!) 124/58 110/65   Pulse: 94 77 84   Resp:      Temp:    97.9 F (36.6 C)  TempSrc:    Oral  SpO2: 98% 92% 95%   Weight:      Height:       General: Elderly male. Awake and alert and oriented x3. No acute cardiopulmonary distress.  HEENT: Normocephalic atraumatic.  Right and left ears normal in appearance.  Pupils equal, round, reactive to light. Extraocular muscles are intact. Sclerae anicteric and noninjected.  Moist mucosal membranes. No mucosal lesions.  Neck: Neck supple without lymphadenopathy. No carotid bruits. No masses palpated.  Cardiovascular:  Regular rate with normal S1-S2 sounds. No murmurs, rubs, gallops auscultated. No JVD.  Respiratory: Good respiratory effort with no wheezes, rales, rhonchi. Lungs clear to auscultation bilaterally.  No accessory muscle use. Abdomen: Soft, nontender, nondistended. Active bowel sounds. No masses or hepatosplenomegaly  Skin: No rashes, lesions, or ulcerations.  Dry, warm to touch. 2+ dorsalis pedis and radial pulses. Musculoskeletal: No calf or leg pain. All major joints not erythematous nontender.  No upper or lower joint deformation.  Good ROM.  No contractures  Psychiatric: Intact judgment and insight. Pleasant and cooperative. Neurologic: No focal neurological deficits. Strength is 5/5 and symmetric in upper and lower extremities.  Cranial nerves II through XII are grossly intact.  Data Reviewed: Labs and imaging reviewed by me  Assessment and Plan: No notes have been filed under  this hospital service. Service: Hospitalist  Principal Problem:   Hematuria Active Problems:   Extensive Bil DVT (deep venous thrombosis)   GERD   LOW BACK PAIN   Hypertension  Hematuria Admit to [facility] NPO after midnight CBC later tonight and in AM Type and Screen at [facility] hospital Urology to see History of DVT on anticoagulation Hold eliquis  Chronic pain Continue chronic pain meds HTN GERD   Advance Care Planning:   Code Status: Full Code   Consults: urology  Family Communication: sister present  Severity of Illness: The appropriate patient status for this patient is INPATIENT. Inpatient status is judged to be reasonable and necessary in order to provide the required intensity of service to ensure the patient's safety. The patient's presenting symptoms, physical exam findings, and initial radiographic and laboratory data in the context of their chronic comorbidities is felt to place them at high risk for further clinical deterioration. Furthermore, it is not anticipated that the patient will be  medically stable for discharge from the hospital within 2 midnights of admission.   * I certify that at the point of admission it is my clinical judgment that the patient will require inpatient hospital care spanning beyond 2 midnights from the point of admission due to high intensity of service, high risk for further deterioration and high frequency of surveillance required.*  Author: Mynor Witkop J Linwood Gullikson, DO 10/28/2024 6:27 PM  For on call review [email or web address].

## 2024-10-28 NOTE — ED Provider Notes (Signed)
 [place] EMERGENCY DEPARTMENT AT [facility] Provider Note   CSN: [id number] Arrival date & time: 10/28/24  1217     Patient presents with: Hematuria   Travis Woodward. is a 83 y.o. male.   Patient is an 83 year old male who presents to the emergency department with a chief complaint of hematuria.  He is on Eliquis  and has been compliant with this.  Patient was seen in the emergency department a few days ago during which time he was having urinary retention and catheter was placed.  He notes that since early this morning he has been having copious blood in his urine and did have some suprapubic abdominal discomfort.  He denies any associated fever or chills.  There is been no associated dizziness or lightheadedness.   Hematuria       Prior to Admission medications   Medication Sig Start Date End Date Taking? Authorizing Provider  apixaban  (ELIQUIS ) 5 MG TABS tablet Take 2 tablets (10 mg total) by mouth 2 (two) times daily for 7 days, THEN 1 tablet (5 mg total) 2 (two) times daily for 21 days. 10/15/23 11/12/23  Lue Elsie BROCKS, MD  aspirin  EC 81 MG tablet Take 81 mg by mouth daily.    [provider]  Cholecalciferol  (VITAMIN D  HIGH POTENCY PO) Take 5,000 Units by mouth daily.    [provider]  clopidogrel  (PLAVIX ) 75 MG tablet Take 1 tablet (75 mg total) by mouth daily. Stop this medication 11/11/23 unless otherwise specified by vascular surgery 10/15/23   Lue Elsie BROCKS, MD  Cyanocobalamin (VITAMIN B-12 IJ) Inject 1 mL as directed every 30 (thirty) days.     [provider]  docusate sodium  (COLACE) 100 MG capsule Take 1 capsule (100 mg total) by mouth 2 (two) times daily. Patient taking differently: Take 100 mg by mouth at bedtime. 08/26/23   Drusilla Sabas RAMAN, MD  finasteride  (PROSCAR ) 5 MG tablet Take 1 tablet (5 mg total) by mouth daily. 11/29/23   Nieves Cough, MD  ipratropium-albuterol  (DUONEB) 0.5-2.5 (3) MG/3ML SOLN Take 3  mLs by nebulization 3 (three) times daily. 08/01/23   Johnson, Clanford L, MD  nitroGLYCERIN  (NITROSTAT ) 0.4 MG SL tablet Place 0.4 mg under the tongue every 5 (five) minutes as needed. 06/23/17   [provider]  oxycodone  (ROXICODONE ) 30 MG immediate release tablet Take 30 mg by mouth every 6 (six) hours. 07/23/23   [provider]  polyethylene glycol (MIRALAX  / GLYCOLAX ) 17 g packet Take 17 g by mouth daily. 08/27/23   Drusilla Sabas RAMAN, MD  predniSONE  (DELTASONE ) 20 MG tablet Take 2 tablets (40 mg total) by mouth daily with breakfast. Patient taking differently: Take 30 mg by mouth daily with breakfast. 08/27/23   Drusilla Sabas RAMAN, MD  tamsulosin  (FLOMAX ) 0.4 MG CAPS capsule Take 1 capsule (0.4 mg total) by mouth daily. 10/24/24   Daralene Lonni BIRCH, PA-C    Allergies: Crestor [rosuvastatin] and Paroxetine    Review of Systems  Genitourinary:  Positive for hematuria.  All other systems reviewed and are negative.   Updated Vital Signs BP 135/67   Pulse 85   Temp 98.2 F (36.8 C) (Oral)   Resp 17   Ht 6' 2 (1.88 m)   Wt 69.9 kg   SpO2 95%   BMI 19.79 kg/m   Physical Exam Vitals and nursing note reviewed.  Constitutional:      General: He is not in acute distress.    Appearance: Normal  appearance. He is not ill-appearing.  HENT:     Head: Normocephalic and atraumatic.     Nose: Nose normal.     Mouth/Throat:     Mouth: Mucous membranes are moist.  Eyes:     Extraocular Movements: Extraocular movements intact.     Conjunctiva/sclera: Conjunctivae normal.     Pupils: Pupils are equal, round, and reactive to light.  Cardiovascular:     Rate and Rhythm: Normal rate and regular rhythm.     Pulses: Normal pulses.     Heart sounds: Normal heart sounds. No murmur heard.    No gallop.  Pulmonary:     Effort: Pulmonary effort is normal. No respiratory distress.     Breath sounds: Normal breath sounds. No stridor. No wheezing, rhonchi or rales.  Abdominal:      General: Abdomen is flat. Bowel sounds are normal. There is no distension.     Palpations: Abdomen is soft.     Tenderness: There is no abdominal tenderness. There is no guarding.  Genitourinary:    Comments: Blood at the urethral meatus, Foley catheter in place, Foley catheter bag with hematuria Musculoskeletal:        General: Normal range of motion.     Cervical back: Normal range of motion and neck supple.  Skin:    General: Skin is warm and dry.     Findings: No bruising or rash.  Neurological:     General: No focal deficit present.     Mental Status: He is alert and oriented to person, place, and time. Mental status is at baseline.  Psychiatric:        Mood and Affect: Mood normal.        Behavior: Behavior normal.        Thought Content: Thought content normal.        Judgment: Judgment normal.     (all labs ordered are listed, but only abnormal results are displayed) Labs Reviewed  COMPREHENSIVE METABOLIC PANEL WITH GFR  CBC WITH DIFFERENTIAL/PLATELET  PROTIME-INR  URINALYSIS, ROUTINE W REFLEX MICROSCOPIC    EKG: None  Radiology: No results found.   Procedures   Medications Ordered in the ED  sodium chloride  irrigation 0.9 % 3,000 mL (has no administration in time range)                                    Medical Decision Making Patient is doing well at this time and does remain stable.  Discussed patient case with Dr. Georganne with urology given the patient's ongoing hematuria despite bladder irrigation.  He did recommend admission to the hospital service and transfer to [name] for urology consult.  Patient is on Eliquis  and has been compliant with this.  He does not have any worsening anemia at this time.  He has no hypotension or tachycardia.  Urinalysis demonstrates no indication for urinary tract infection.  Have discussed patient case with Dr. Barbra with the hospitalist service who has excepted for admission.  Amount and/or Complexity of Data  Reviewed Labs: ordered. Radiology: ordered.  Risk Prescription drug management. Decision regarding hospitalization.        Final diagnoses:  None    ED Discharge Orders     None          Daralene Lonni JONETTA DEVONNA 10/28/24 1839    Charlyn Sora, MD 10/29/24 [phone]

## 2024-10-28 NOTE — ED Triage Notes (Signed)
 Pt arrived [facility] for c/o blood in urine and around site. Pt takes eliquis  bid and last dose was l;ast night. Pt stated bleeding started yesterday evening.

## 2024-10-29 ENCOUNTER — Inpatient Hospital Stay (HOSPITAL_COMMUNITY): Admitting: Anesthesiology

## 2024-10-29 ENCOUNTER — Encounter (HOSPITAL_COMMUNITY)
Admission: EM | Disposition: A | Discharge status: Home / Self Care | Payer: Self-pay | Source: Home / Self Care | Attending: Internal Medicine

## 2024-10-29 ENCOUNTER — Encounter (HOSPITAL_COMMUNITY): Payer: Self-pay | Admitting: Family Medicine

## 2024-10-29 DIAGNOSIS — Z87891 Personal history of nicotine dependence: Secondary | ICD-10-CM

## 2024-10-29 DIAGNOSIS — R310 Gross hematuria: Secondary | ICD-10-CM | POA: Diagnosis not present

## 2024-10-29 DIAGNOSIS — K219 Gastro-esophageal reflux disease without esophagitis: Secondary | ICD-10-CM

## 2024-10-29 DIAGNOSIS — N3289 Other specified disorders of bladder: Secondary | ICD-10-CM | POA: Diagnosis not present

## 2024-10-29 DIAGNOSIS — I10 Essential (primary) hypertension: Secondary | ICD-10-CM | POA: Diagnosis not present

## 2024-10-29 DIAGNOSIS — I2510 Atherosclerotic heart disease of native coronary artery without angina pectoris: Secondary | ICD-10-CM | POA: Diagnosis not present

## 2024-10-29 DIAGNOSIS — I82493 Acute embolism and thrombosis of other specified deep vein of lower extremity, bilateral: Secondary | ICD-10-CM

## 2024-10-29 HISTORY — PX: CYSTOSCOPY WITH FULGERATION: SHX6638

## 2024-10-29 LAB — CBC
HCT: 32 % — ABNORMAL LOW (ref 39.0–52.0)
Hemoglobin: 10.1 g/dL — ABNORMAL LOW (ref 13.0–17.0)
MCH: 29.8 pg (ref 26.0–34.0)
MCHC: 31.6 g/dL (ref 30.0–36.0)
MCV: 94.4 fL (ref 80.0–100.0)
Platelets: 111 K/uL — ABNORMAL LOW (ref 150–400)
RBC: 3.39 MIL/uL — ABNORMAL LOW (ref 4.22–5.81)
RDW: 15.8 % — ABNORMAL HIGH (ref 11.5–15.5)
WBC: 8.1 K/uL (ref 4.0–10.5)
nRBC: 0 % (ref 0.0–0.2)

## 2024-10-29 LAB — BASIC METABOLIC PANEL WITH GFR
Anion gap: 8 (ref 5–15)
BUN: 22 mg/dL (ref 8–23)
CO2: 28 mmol/L (ref 22–32)
Calcium: 9.4 mg/dL (ref 8.9–10.3)
Chloride: 102 mmol/L (ref 98–111)
Creatinine, Ser: 1.31 mg/dL — ABNORMAL HIGH (ref 0.61–1.24)
GFR, Estimated: 54 mL/min — ABNORMAL LOW (ref >60)
Glucose, Bld: 96 mg/dL (ref 70–99)
Potassium: 4.4 mmol/L (ref 3.5–5.1)
Sodium: 137 mmol/L (ref 135–145)

## 2024-10-29 LAB — SURGICAL PCR SCREEN
MRSA, PCR: NEGATIVE
Staphylococcus aureus: NEGATIVE

## 2024-10-29 LAB — APTT: aPTT: 33 s (ref 24–36)

## 2024-10-29 LAB — PROTIME-INR
INR: 1.2 (ref 0.8–1.2)
Prothrombin Time: 15.5 s — ABNORMAL HIGH (ref 11.4–15.2)

## 2024-10-29 SURGERY — CYSTOSCOPY, WITH BLADDER FULGURATION
Anesthesia: General | Site: Penis

## 2024-10-29 MED ORDER — SUGAMMADEX SODIUM 200 MG/2ML IV SOLN
INTRAVENOUS | Status: AC
  Filled 2024-10-29: qty 2

## 2024-10-29 MED ORDER — DEXAMETHASONE SOD PHOSPHATE PF 10 MG/ML IJ SOLN
INTRAMUSCULAR | Status: DC | PRN
  Administered 2024-10-29: 4 mg via INTRAVENOUS

## 2024-10-29 MED ORDER — PHENYLEPHRINE 80 MCG/ML (10ML) SYRINGE FOR IV PUSH (FOR BLOOD PRESSURE SUPPORT)
PREFILLED_SYRINGE | INTRAVENOUS | Status: AC
  Filled 2024-10-29: qty 10

## 2024-10-29 MED ORDER — STERILE WATER FOR IRRIGATION IR SOLN
Status: DC | PRN
  Administered 2024-10-29 (×4): 3000 mL

## 2024-10-29 MED ORDER — OXYCODONE HCL 5 MG PO TABS: 5.0000 mg | ORAL_TABLET | Freq: Once | ORAL | Status: DC | PRN

## 2024-10-29 MED ORDER — CEFAZOLIN SODIUM-DEXTROSE 2-3 GM-%(50ML) IV SOLR
INTRAVENOUS | Status: DC | PRN
  Administered 2024-10-29: 2 g via INTRAVENOUS

## 2024-10-29 MED ORDER — OXYCODONE HCL 5 MG/5ML PO SOLN: 5.0000 mg | Freq: Once | ORAL | Status: DC | PRN

## 2024-10-29 MED ORDER — OXYCODONE HCL 5 MG PO TABS
15.0000 mg | ORAL_TABLET | Freq: Three times a day (TID) | ORAL | Status: DC | PRN
  Administered 2024-10-29 – 2024-10-31 (×6): 15 mg via ORAL
  Filled 2024-10-29 (×6): qty 3

## 2024-10-29 MED ORDER — ONDANSETRON HCL 4 MG/2ML IJ SOLN
INTRAMUSCULAR | Status: DC | PRN
  Administered 2024-10-29: 4 mg via INTRAVENOUS

## 2024-10-29 MED ORDER — ONDANSETRON HCL 4 MG/2ML IJ SOLN
INTRAMUSCULAR | Status: AC
  Filled 2024-10-29: qty 2

## 2024-10-29 MED ORDER — PROPOFOL 10 MG/ML IV BOLUS
INTRAVENOUS | Status: DC | PRN
  Administered 2024-10-29: 70 mg via INTRAVENOUS

## 2024-10-29 MED ORDER — LACTATED RINGERS IV SOLN: INTRAVENOUS | Status: DC | PRN

## 2024-10-29 MED ORDER — PHENYLEPHRINE HCL-NACL 20-0.9 MG/250ML-% IV SOLN
INTRAVENOUS | Status: DC | PRN
  Administered 2024-10-29 (×4): 80 ug via INTRAVENOUS

## 2024-10-29 MED ORDER — AMISULPRIDE (ANTIEMETIC) 5 MG/2ML IV SOLN: 10.0000 mg | Freq: Once | INTRAVENOUS | Status: DC | PRN

## 2024-10-29 MED ORDER — SUCCINYLCHOLINE CHLORIDE 200 MG/10ML IV SOSY
PREFILLED_SYRINGE | INTRAVENOUS | Status: DC | PRN
  Administered 2024-10-29: 120 mg via INTRAVENOUS

## 2024-10-29 MED ORDER — FENTANYL CITRATE (PF) 100 MCG/2ML IJ SOLN
INTRAMUSCULAR | Status: DC | PRN
  Administered 2024-10-29 (×4): 25 ug via INTRAVENOUS

## 2024-10-29 MED ORDER — LIDOCAINE HCL (CARDIAC) PF 100 MG/5ML IV SOSY
PREFILLED_SYRINGE | INTRAVENOUS | Status: DC | PRN
  Administered 2024-10-29: 50 mg via INTRATRACHEAL

## 2024-10-29 MED ORDER — LIDOCAINE HCL (PF) 2 % IJ SOLN
INTRAMUSCULAR | Status: AC
  Filled 2024-10-29: qty 5

## 2024-10-29 MED ORDER — ROCURONIUM BROMIDE 10 MG/ML (PF) SYRINGE
PREFILLED_SYRINGE | INTRAVENOUS | Status: AC
  Filled 2024-10-29: qty 10

## 2024-10-29 MED ORDER — ROCURONIUM BROMIDE 10 MG/ML (PF) SYRINGE
PREFILLED_SYRINGE | INTRAVENOUS | Status: DC | PRN
  Administered 2024-10-29: 10 mg via INTRAVENOUS
  Administered 2024-10-29: 30 mg via INTRAVENOUS

## 2024-10-29 MED ORDER — FINASTERIDE 5 MG PO TABS
5.0000 mg | ORAL_TABLET | Freq: Every day | ORAL | Status: DC
  Administered 2024-10-29 – 2024-10-31 (×3): 5 mg via ORAL
  Filled 2024-10-29 (×3): qty 1

## 2024-10-29 MED ORDER — SUGAMMADEX SODIUM 200 MG/2ML IV SOLN
INTRAVENOUS | Status: DC | PRN
  Administered 2024-10-29: 200 mg via INTRAVENOUS

## 2024-10-29 MED ORDER — SUCCINYLCHOLINE CHLORIDE 200 MG/10ML IV SOSY
PREFILLED_SYRINGE | INTRAVENOUS | Status: AC
Start: 2024-10-29 — End: 2024-10-29
  Filled 2024-10-29: qty 10

## 2024-10-29 MED ORDER — PROPOFOL 10 MG/ML IV BOLUS
INTRAVENOUS | Status: AC
  Filled 2024-10-29: qty 20

## 2024-10-29 MED ORDER — CEFAZOLIN SODIUM-DEXTROSE 2-4 GM/100ML-% IV SOLN
INTRAVENOUS | Status: AC
  Filled 2024-10-29: qty 100

## 2024-10-29 MED ORDER — FENTANYL CITRATE (PF) 50 MCG/ML IJ SOSY: 25.0000 ug | PREFILLED_SYRINGE | INTRAMUSCULAR | Status: DC | PRN

## 2024-10-29 MED ORDER — FENTANYL CITRATE (PF) 50 MCG/ML IJ SOSY
12.5000 ug | PREFILLED_SYRINGE | INTRAMUSCULAR | Status: DC | PRN
Start: 2024-10-29 — End: 2024-10-31

## 2024-10-29 MED ORDER — FENTANYL CITRATE (PF) 100 MCG/2ML IJ SOLN
INTRAMUSCULAR | Status: AC
  Filled 2024-10-29: qty 2

## 2024-10-29 SURGICAL SUPPLY — 8 items
BAG DRAIN URO-CYSTO SKYTR STRL (DRAIN) IMPLANT
CATH FOLEY 3WAY 30CC 22FR (CATHETERS) IMPLANT
GLOVE BIO SURGEON STRL SZ7 (GLOVE) IMPLANT
GOWN STRL REUS W/TWL XL LVL3 (GOWN DISPOSABLE) IMPLANT
LOOP CUT BIPOLAR 24F LRG (ELECTROSURGICAL) IMPLANT
MANIFOLD NEPTUNE II (INSTRUMENTS) IMPLANT
PACK CYSTO (CUSTOM PROCEDURE TRAY) IMPLANT
WATER STERILE IRR 500ML POUR (IV SOLUTION) IMPLANT

## 2024-10-29 NOTE — Consult Note (Signed)
 Urology Consult   I have been asked to see the patient by Dr. Lazarus, for evaluation and management of hematuria with clots.  HPI:  Travis Woodward. is a 83 y.o. year old  Initial ED visit 10/28 - with gross hematuria, patient attempted to straight cath himself, frank blood  - Foley placed, discharged home 2nd ED visit 11/1 - continued GH, swapped to a 3-way catheter and started CBI   - transferred to [facility]   - Unfortunately, Urology not aware until 11/2 pm    - CT Stone (11/1) -extensive intravesical clot, indwelling Foley in place, no hydro  Patient doing okay on my exam Says he has a remote history of very light GH, never like this Recent GH began acutely after catheterization during a hospitalization for a mechanical fall in mid OCt  He is on Eliquis  + ASA + Plavix  for DVT history   - stopped taking all 3 medications 10/31 pm  Previously saw Urology, Dr. Nieves in Dec 2024   - hx of 120g BPH   - hx of prior AUR, post op retention     PMH: Past Medical History:  Diagnosis Date   Chronic kidney disease    stage 3   Colon polyps    Complication of anesthesia    difficulty urinating    Coronary artery disease    Enlarged prostate    Hyperlipidemia    Hypertension     Surgical History: Past Surgical History:  Procedure Laterality Date   acd fusion & plating     BACK SURGERY     x 6   CARDIAC CATHETERIZATION  2005   CHOLECYSTECTOMY N/A 07/19/2017   Procedure: LAPAROSCOPIC CHOLECYSTECTOMY;  Surgeon: Mavis Anes, MD;  Location: [facility];  Service: General;  Laterality: N/A;   CORONARY STENT PLACEMENT  2005   HEMORRHOID SURGERY     HIP ARTHROPLASTY Left 06/29/2019   Procedure: ARTHROPLASTY BIPOLAR HIP (HEMIARTHROPLASTY);  Surgeon: Josefina Chew, MD;  Location: [facility];  Service: Orthopedics;  Laterality: Left;   LAPAROSCOPIC APPENDECTOMY  04/17/2012   Procedure: APPENDECTOMY LAPAROSCOPIC;  Surgeon: Anes DELENA Mavis, MD;  Location: [facility];  Service: General;   Laterality: N/A;   PERIPHERAL VASCULAR INTERVENTION  10/14/2023   Procedure: PERIPHERAL VASCULAR INTERVENTION;  Surgeon: Pearline Norman RAMAN, MD;  Location: [facility] INVASIVE CV LAB;  Service: Cardiovascular;;   PERIPHERAL VASCULAR THROMBECTOMY Right 10/14/2023   Procedure: PERIPHERAL VASCULAR THROMBECTOMY;  Surgeon: Pearline Norman RAMAN, MD;  Location: [facility] INVASIVE CV LAB;  Service: Cardiovascular;  Laterality: Right;   PERIPHERAL VASCULAR ULTRASOUND/IVUS Right 10/14/2023   Procedure: Peripheral Vascular Ultrasound/IVUS;  Surgeon: Pearline Norman RAMAN, MD;  Location: [facility] INVASIVE CV LAB;  Service: Cardiovascular;  Laterality: Right;   TONSILLECTOMY      Allergies:  Allergies  Allergen Reactions   Crestor [Rosuvastatin] Other (See Comments)    Unknown    Paroxetine Rash    Family History: Family History  Problem Relation Age of Onset   Heart attack Mother    Tuberculosis Maternal Grandfather    Adrenal disorder Neg Hx    Colon cancer Neg Hx    Stomach cancer Neg Hx    Pancreatic cancer Neg Hx    Esophageal cancer Neg Hx     Social History:  reports that he quit smoking about 52 years ago. His smoking use included cigarettes. He has been exposed to tobacco smoke. He has never used smokeless tobacco. He reports that he does not drink alcohol  and does not use drugs.  ROS: Negative aside from those stated in the HPI.  Physical Exam: BP 117/64 (BP Location: Left Arm)   Pulse 75   Temp 98.2 F (36.8 C) (Oral)   Resp 20   Ht 6' 2 (1.88 m)   Wt 69.9 kg   SpO2 99%   BMI 19.79 kg/m    Constitutional:  Alert and oriented, No acute distress. Cardiovascular: No clubbing, cyanosis, or edema. Respiratory: Normal respiratory effort, no increased work of breathing. GI: Abdomen is soft, nontender, nondistended, no abdominal masses GU: 22F 3-way catheter with clot along entire tubing, light pink efflux around clot  Lymph: No cervical or inguinal lymphadenopathy. Skin: No rashes, bruises or suspicious  lesions. Neurologic: Grossly intact, no focal deficits, moving all 4 extremities. Psychiatric: Normal mood and affect.   Laboratory Data:  Latest Reference Range & Units 10/29/24 05:13  Creatinine 0.61 - 1.24 mg/dL 8.68 (H)  (H): Data is abnormally high   Latest Reference Range & Units 10/29/24 05:13  Hemoglobin 13.0 - 17.0 g/dL 89.8 (L)  (L): Data is abnormally low  UA nitrite/LE negative, >50 RBC/WBC  Pertinent Imaging: I have personally reviewed the CT Stone (10/28/24) - extensive intravesical clot with indwelling foley in place, no hydronephrosis.      Assessment & Plan:   Recurrent GH with clot obstruction   - significant intravesical clot (via CT)    - current 22F 3-way catheter with weak drainage   - 120g BPH On Eliquis  + ASA + Plavix  for DVT hx (stopped 10/31 pm)  Likely bleeding from BPH source (very large gland) in the setting of DAPT + anticoagulation. Possible traumatic catheterization from prior hospitalization. He currently only has an 22F 3-way in, and I suspect his light pink efflux is only swirling around large congealed clot. I think the likelihood of significant clot evacuation via bedside upsizing and hand-irrigation would be low. As such, I described a cystoscopy, clot evacuation, fulguration procedure under anesthesia. I think the benefits of the procedure outweigh the risks of exacerbated bleeding while on anticoagulation- Eliquis  is approaching subtherapeutic at this point. Patient understood the rationale and agreed to proceed. NPO since 2pm today.   - plan to post to OR tonight: cystoscopy, clot evacuation, fulguration of bleeding vessels, possible transurethral resection of lesion or clot  Penne JONELLE Skye, MD  [facility] [address], [address] [place], [place] [address] [phone]

## 2024-10-29 NOTE — Hospital Course (Signed)
 Travis Woodward. is a 83 y.o. male with medical history significant of HTN, stage 3 CKD, BPH, who presents with hematuria.  He presented in the ER on 10/28 with difficulty urinating.  He attempted to straight cath, but just got frank blood.  He was having a little bit of suprapubic abdominal discomfort but no pain.  His bladder was irrigated and it appeared that his hematuria had stopped.  He was discharged to home with Foley catheter in place to follow-up with urology.  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  He woke up this morning with frank blood in his urine and return to the hospital for evaluation.  Here, irrigation was attempted, but the hematuria did not stop.  Urology was consulted who recommended that the patient be admitted down to [facility] long and that they would evaluate him.    **Interim History  Continues to have gross hematuria and his anticoagulation continues to be held.  Has a CBI and getting Foley catheter irrigation.  Urology consulted.  Assessment and Plan:  Gross Hematuria / BPH: Admitted to [facility] and has 3 way Catheter and undergoing CBI. Holding Anticoagulation. CBC trend as below. C/w Tamsulosin  0.4 mg po Daily. Urology consulted and appreciate further evaluation and recc's; Type and Screen and Transfuse for Hgb <7  History of DVT on anticoagulation: Hold Anticoagulation with Apixaban ; Last dose was AM of 11/1; Also holding ASA 81 mg po Daily  Chronic pain: Continue chronic pain meds w/ Oxycodone  15 mg po q8hprn and C/w Fentanyl  12.5-25 mcg IV q2hprn Severe Pain  Essential HTN: Not on any antihypertensives. CTM BP per Protocol. Last BP reading is 117/64  ABLA in the setting of Hematuria superimposed on Normocytic Anemia: Hgb/Hct Trend: Recent Labs  Lab 10/28/24 1533 10/28/24 2119 10/29/24 0513  HGB 10.9* 10.6* 10.1*  HCT 33.7* 33.8* 32.0*  MCV 93.9 93.9 94.4  -Check Anemia Panel in the AM. CTM for S/Sx of Bleeding; Having gross  hematuria and bloody Urine currently. Repeat CBC in the the AM  CKD Stage 3a: BUN/Cr Trend: Recent Labs  Lab 10/28/24 1533 10/29/24 0513  BUN 21 22  CREATININE 1.28* 1.31*  -Avoid Nephrotoxic Medications, Contrast Dyes, Hypotension and Dehydration to Ensure Adequate Renal Perfusion and will need to Renally Adjust Meds. CTM and Trend Renal Function carefully and repeat CMP in the AM   Thrombocytopenia: Plt Count Trend went from 105 -> 119 -> 111. Having Gross Hematuria as above. CTM and trend and repeat CBC in the AM  GERD/GI Prophylaxis: Does not appear to be on on any PPI or H2 Blocker but if necessary will place him on one
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
## 2024-10-29 NOTE — Anesthesia Procedure Notes (Signed)
 Procedure Name: Intubation Date/Time: 10/29/2024 10:02 PM  Performed by: Pheobe Adine CROME, CRNAPre-anesthesia Checklist: Emergency Drugs available, Patient identified, Suction available, Patient being monitored and Timeout performed Patient Re-evaluated:Patient Re-evaluated prior to induction Oxygen Delivery Method: Circle system utilized Preoxygenation: Pre-oxygenation with 100% oxygen Induction Type: IV induction Laryngoscope Size: 3 and Miller Grade View: Grade I Tube type: Oral Number of attempts: 1 Airway Equipment and Method: Stylet Placement Confirmation: ETT inserted through vocal cords under direct vision, positive ETCO2, CO2 detector and breath sounds checked- equal and bilateral Secured at: 23 cm Tube secured with: Tape Dental Injury: Teeth and Oropharynx as per pre-operative assessment

## 2024-10-29 NOTE — Transfer of Care (Signed)
 Immediate Anesthesia Transfer of Care Note  Patient: Travis Woodward.  Procedure(s) Performed: CYSTOSCOPY, WITH BLADDER FULGURATION  Patient Location: PACU  Anesthesia Type:General  Level of Consciousness: awake, alert , oriented, and patient cooperative  Airway & Oxygen Therapy: Patient Spontanous Breathing and Patient connected to face mask oxygen  Post-op Assessment: Report given to RN and Post -op Vital signs reviewed and stable  Post vital signs: Reviewed and stable  Last Vitals:  Vitals Value Taken Time  BP 142/73 10/29/24 22:55  Temp 98.6   Pulse 78 10/29/24 22:57  Resp 14 10/29/24 22:57  SpO2 100 % 10/29/24 22:57  Vitals shown include unfiled device data.  Last Pain:  Vitals:   10/29/24 2150  TempSrc:   PainSc: 0-No pain         Complications: No notable events documented.

## 2024-10-29 NOTE — Op Note (Addendum)
 Date of procedure: 10/29/24  Preoperative diagnosis:  Gross hematuria with clot obstruction   Postoperative diagnosis:  Gross hematuria with clot obstruction    Procedure: Cystoscopy with clot evacuation and fulguration of bleeding vessels Urethral dilation, male sounds  Surgeon: Penne Skye, MD  Anesthesia: General  Complications: None  Intraoperative findings:  Significant intravesical bladder clot, moderately congealed. Fully evacuated  Very large trilobar occlusive BPH with posterior bladder neck varices- likely source of hematuria. All hypervascular varices and open vessels were fulgurated No bladder masses, mild diffuse trabeculations Abnormal dorsal penile exam- firm mobile dorsal penile shaft lesion ~2-3 cm size. Attention at follow up  EBL: 20cc  Specimens: None  Drains: 53F 3-way catheter with 20cc in balloon  Indication: Travis Woodward. is a 83 y.o. patient with:  Recurrent GH with clot obstruction   - significant intravesical clot (via CT)    - current 61F 3-way catheter with weak drainage   - 120g BPH On Eliquis  + ASA + Plavix  for DVT hx (stopped 10/31 pm)  After reviewing the management options for treatment, they elected to proceed with the above surgical procedure(s). We have discussed the potential benefits and risks of the procedure, side effects of the proposed treatment, the likelihood of the patient achieving the goals of the procedure, and any potential problems that might occur during the procedure or recuperation. Informed consent has been obtained.  Description of procedure:  The patient was taken to the operating room and general anesthesia was induced. SCDs were placed for DVT prophylaxis. The patient was placed in the dorsal lithotomy position, prepped and draped in the usual sterile fashion, and preoperative antibiotics were administered. A preoperative time-out was performed.   First, we gently dilated the meatus and fossa with Fleeta buren  sounds from 20 - 33F. This allowed passage of our rigid 46F resectoscope. A complete inspection of the urethra and bladder was performed. He has a very large trilobar prostate with a posterior median lobe. We encountered large clot burden within the bladder with moderate congealment. Fortunately, we were able to fully evacuate the clot burden with fill/empty. The left UO was visualized although difficult to find tucked posteriorly under the median lobe. The right UO was unable to be visualized under the median lobe tissue. No bladder masses or lesions were noted. We carefully fulgurated several bladder neck varices and prostatic hypervascularity. Hemostasis was excellent and urine was clear without inflow. We left the bladder full and inserted a 53F 3-way catheter with 20cc sterile water  in the balloon. CBI was connected and set to medium drip with clear effluent.   At this point all instruments were removed and this concluded the procedure.  Patient tolerated well, was extubated and transferred to the PACU in excellent condition.  Disposition: Stable to PACU   Plan: - return to floor for overnight CBI. Plan to wean to clear/light pink.   Penne Skye, MD

## 2024-10-29 NOTE — Plan of Care (Signed)

## 2024-10-29 NOTE — Progress Notes (Signed)
 PROGRESS NOTE    Travis Woodward.  FMW:[id number] DOB: 05/10/1941 DOA: 10/28/2024 PCP: Orpha Yancey LABOR, MD   Brief Narrative:  Travis Woodward. is a 83 y.o. male with medical history significant of HTN, stage 3 CKD, BPH, who presents with hematuria.  He presented in the ER on 10/28 with difficulty urinating.  He attempted to straight cath, but just got frank blood.  He was having a little bit of suprapubic abdominal discomfort but no pain.  His bladder was irrigated and it appeared that his hematuria had stopped.  He was discharged to home with Foley catheter in place to follow-up with urology.  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  He woke up this morning with frank blood in his urine and return to the hospital for evaluation.  Here, irrigation was attempted, but the hematuria did not stop.  Urology was consulted who recommended that the patient be admitted down to [facility] long and that they would evaluate him.    **Interim History  Continues to have gross hematuria and his anticoagulation continues to be held.  Has a CBI and getting Foley catheter irrigation.  Urology consulted.  Assessment and Plan:  Gross Hematuria / BPH: Admitted to [facility] and has 3 way Catheter and undergoing CBI. Holding Anticoagulation. CBC trend as below. C/w Tamsulosin  0.4 mg po Daily. Urology consulted and appreciate further evaluation and recc's; Type and Screen and Transfuse for Hgb <7  History of DVT on anticoagulation: Hold Anticoagulation with Apixaban ; Last dose was AM of 11/1; Also holding ASA 81 mg po Daily  Chronic pain: Continue chronic pain meds w/ Oxycodone  15 mg po q8hprn and C/w Fentanyl  12.5-25 mcg IV q2hprn Severe Pain  Essential HTN: Not on any antihypertensives. CTM BP per Protocol. Last BP reading is 117/64  ABLA in the setting of Hematuria superimposed on Normocytic Anemia: Hgb/Hct Trend: Recent Labs  Lab 10/28/24 1533 10/28/24 2119 10/29/24 0513  HGB  10.9* 10.6* 10.1*  HCT 33.7* 33.8* 32.0*  MCV 93.9 93.9 94.4  -Check Anemia Panel in the AM. CTM for S/Sx of Bleeding; Having gross hematuria and bloody Urine currently. Repeat CBC in the the AM  CKD Stage 3a: BUN/Cr Trend: Recent Labs  Lab 10/28/24 1533 10/29/24 0513  BUN 21 22  CREATININE 1.28* 1.31*  -Avoid Nephrotoxic Medications, Contrast Dyes, Hypotension and Dehydration to Ensure Adequate Renal Perfusion and will need to Renally Adjust Meds. CTM and Trend Renal Function carefully and repeat CMP in the AM   Thrombocytopenia: Plt Count Trend went from 105 -> 119 -> 111. Having Gross Hematuria as above. CTM and trend and repeat CBC in the AM  GERD/GI Prophylaxis: Does not appear to be on on any PPI or H2 Blocker but if necessary will place him on one   DVT prophylaxis: SCDs Start: 10/28/24 2103    Code Status: Full Code Family Communication: D/w daughter @ bedside   Disposition Plan:  Level of care: Med-Surg Status is: Inpatient Remains inpatient appropriate because: Needs further clinical improvement and evaluation by Urology   Consultants:  Urology  Procedures:  As delineated as above  Antimicrobials:  Anti-infectives (From admission, onward)    None       Subjective: Seen and examined at bedside and states that he did not sleep very well last night.  Continues to have bloody urine and some abdominal pressure.  No nausea or vomiting.  No other concerns or complaints  at this time.  Objective: Vitals:   10/29/24 0018 10/29/24 0435 10/29/24 0922 10/29/24 1301  BP: 124/64 (!) 123/58 (!) 127/59 117/64  Pulse: 72 67 70 75  Resp: 16 16 20 20   Temp: 98.6 F (37 C) 97.9 F (36.6 C) 98.5 F (36.9 C) 98.2 F (36.8 C)  TempSrc: Oral Oral  Oral  SpO2: 100% 100% 99% 99%  Weight:      Height:        Intake/Output Summary (Last 24 hours) at 10/29/2024 1651 Last data filed at 10/29/2024 1552 Gross per 24 hour  Intake 63509 ml  Output 48800 ml  Net -12310 ml    Filed Weights   10/28/24 1235  Weight: 69.9 kg   Examination: Physical Exam:  Constitutional: An elderly chronically ill-appearing Caucasian male in no acute distress Respiratory: Diminished to auscultation bilaterally, no wheezing, rales, rhonchi or crackles. Normal respiratory effort and patient is not tachypenic. No accessory muscle use.  Unlabored breathing Cardiovascular: RRR, no murmurs / rubs / gallops. S1 and S2 auscultated.  Mild extremity edema Abdomen: Soft, slightly-tender in the lower abdomen, non-distended.  Bowel sounds positive.  GU: Deferred.  Has a three-way Foley catheter and undergoing CBI with gross hematuria and dark red urine Musculoskeletal: No clubbing / cyanosis of digits/nails. Normal strength and muscle tone.  Skin: No rashes, lesions, ulcers on limited skin evaluation. No induration; Warm and dry.  Neurologic: CN 2-12 grossly intact with no focal deficits. Romberg sign and cerebellar reflexes not assessed.  Psychiatric: Normal judgment and insight. Alert and oriented x 3. Normal mood and appropriate affect.   Data Reviewed: I have personally reviewed following labs and imaging studies  CBC: Recent Labs  Lab 10/28/24 1533 10/28/24 2119 10/29/24 0513  WBC 6.4 8.5 8.1  NEUTROABS 4.7  --   --   HGB 10.9* 10.6* 10.1*  HCT 33.7* 33.8* 32.0*  MCV 93.9 93.9 94.4  PLT 105* 119* 111*   Basic Metabolic Panel: Recent Labs  Lab 10/28/24 1533 10/29/24 0513  NA 138 137  K 4.2 4.4  CL 102 102  CO2 26 28  GLUCOSE 109* 96  BUN 21 22  CREATININE 1.28* 1.31*  CALCIUM 9.2 9.4   GFR: Estimated Creatinine Clearance: 42.2 mL/min (A) (by C-G formula based on SCr of 1.31 mg/dL (H)). Liver Function Tests: Recent Labs  Lab 10/28/24 1533  AST 12*  ALT 8  ALKPHOS 107  BILITOT 0.6  PROT 6.4*  ALBUMIN 4.0   No results for input(s): LIPASE, AMYLASE in the last 168 hours. No results for input(s): AMMONIA in the last 168 hours. Coagulation  Profile: Recent Labs  Lab 10/28/24 1533 10/29/24 0513  INR 1.2 1.2   Cardiac Enzymes: No results for input(s): CKTOTAL, CKMB, CKMBINDEX, TROPONINI in the last 168 hours. BNP (last 3 results) No results for input(s): PROBNP in the last 8760 hours. HbA1C: No results for input(s): HGBA1C in the last 72 hours. CBG: No results for input(s): GLUCAP in the last 168 hours. Lipid Profile: No results for input(s): CHOL, HDL, LDLCALC, TRIG, CHOLHDL, LDLDIRECT in the last 72 hours. Thyroid  Function Tests: No results for input(s): TSH, T4TOTAL, FREET4, T3FREE, THYROIDAB in the last 72 hours. Anemia Panel: No results for input(s): VITAMINB12, FOLATE, FERRITIN, TIBC, IRON, RETICCTPCT in the last 72 hours. Sepsis Labs: No results for input(s): PROCALCITON, LATICACIDVEN in the last 168 hours.  Recent Results (from the past 240 hours)  Urine Culture     Status: None   Collection Time:  10/24/24  3:40 PM   Specimen: Urine, Catheterized  Result Value Ref Range Status   Specimen Description   Final    URINE, CATHETERIZED Performed at [facility], [address] [place], [place] [address]    Special Requests   Final    NONE Performed at [facility], [address], [place], [place] [address]    Culture   Final    NO GROWTH Performed at [facility], [address], [place], [place] [address]    Report Status 10/25/2024 FINAL  Final    Radiology Studies: CT Renal Stone Study Result Date: 10/28/2024 EXAM: CT ABDOMEN AND PELVIS WITHOUT CONTRAST 10/28/2024 02:37:45 PM TECHNIQUE: CT of the abdomen and pelvis was performed without the administration of intravenous contrast. Multiplanar reformatted images are provided for review. Automated exposure control, iterative reconstruction, and/or weight-based adjustment of the mA/kV was utilized to reduce the radiation dose to as low as reasonably achievable. COMPARISON: CT dated 11/14/2018. CLINICAL  HISTORY: Abdominal/flank pain, stone suspected; Gross hematuria. 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 Calcified pleural plaque along the right anterior lower chest wall. LIVER: The liver is unremarkable. GALLBLADDER AND BILE DUCTS: Status post cholecystectomy. No biliary ductal dilatation. SPLEEN: No acute abnormality. PANCREAS: No acute abnormality. ADRENAL GLANDS: No acute abnormality. KIDNEYS, URETERS AND BLADDER: No stones in the kidneys or ureters. No hydronephrosis. No perinephric or periureteral stranding. Indwelling foley catheter with extensive hyperdense material around the foley likely representing blood products within the bladder. GI AND BOWEL: Stomach demonstrates no acute abnormality. There is no bowel obstruction. PERITONEUM AND RETROPERITONEUM: No ascites. No free air. VASCULATURE: Aorta is normal in caliber. Prior right iliac vein stenting. LYMPH NODES: No lymphadenopathy. REPRODUCTIVE ORGANS: The pelvis is poorly visualized due to streak artifact from adjacent hip arthroplasty hardware but there is likely prostatomegaly. BONES AND SOFT TISSUES: Left hip arthroplasty hardware with adjacent streak artifact, limiting evaluation somewhat. No acute osseous abnormality. No focal soft tissue abnormality. IMPRESSION: 1. Indwelling Foley catheter with extensive hyperdense material in the bladder, likely blood products. Cystoscopy may be helpful to exclude underlying mass. 2. Probable prostatomegaly, assessment limited by streak artifact from left hip arthroplasty hardware. Electronically signed by: Michaeline Blanch MD 10/28/2024 02:54 PM EDT RP Workstation: [id number]   Scheduled Meds:  docusate sodium   100 mg Oral QHS   Influenza vac split trivalent PF  0.5 mL Intramuscular Tomorrow-1000   polyethylene glycol  17 g Oral QHS   tamsulosin   0.4 mg Oral Daily   Continuous Infusions:  sodium chloride  irrigation 0 mL (10/28/24 1557)    LOS: 1 day   Alejandro Marker,  DO [facility] Available via Epic secure chat 7am-7pm After these hours, please refer to coverage provider listed on [email or web address] 10/29/2024, 4:51 PM

## 2024-10-30 ENCOUNTER — Encounter (HOSPITAL_COMMUNITY): Payer: Self-pay | Admitting: Urology

## 2024-10-30 DIAGNOSIS — Z86718 Personal history of other venous thrombosis and embolism: Secondary | ICD-10-CM

## 2024-10-30 DIAGNOSIS — I10 Essential (primary) hypertension: Secondary | ICD-10-CM | POA: Diagnosis not present

## 2024-10-30 DIAGNOSIS — K219 Gastro-esophageal reflux disease without esophagitis: Secondary | ICD-10-CM | POA: Diagnosis not present

## 2024-10-30 DIAGNOSIS — R319 Hematuria, unspecified: Secondary | ICD-10-CM

## 2024-10-30 LAB — CBC WITH DIFFERENTIAL/PLATELET
Abs Immature Granulocytes: 0.02 K/uL (ref 0.00–0.07)
Basophils Absolute: 0 K/uL (ref 0.0–0.1)
Basophils Relative: 0 %
Eosinophils Absolute: 0 K/uL (ref 0.0–0.5)
Eosinophils Relative: 0 %
HCT: 30.2 % — ABNORMAL LOW (ref 39.0–52.0)
Hemoglobin: 9.4 g/dL — ABNORMAL LOW (ref 13.0–17.0)
Immature Granulocytes: 0 %
Lymphocytes Relative: 11 %
Lymphs Abs: 0.8 K/uL (ref 0.7–4.0)
MCH: 29.3 pg (ref 26.0–34.0)
MCHC: 31.1 g/dL (ref 30.0–36.0)
MCV: 94.1 fL (ref 80.0–100.0)
Monocytes Absolute: 0.2 K/uL (ref 0.1–1.0)
Monocytes Relative: 3 %
Neutro Abs: 5.8 K/uL (ref 1.7–7.7)
Neutrophils Relative %: 86 %
Platelets: 101 K/uL — ABNORMAL LOW (ref 150–400)
RBC: 3.21 MIL/uL — ABNORMAL LOW (ref 4.22–5.81)
RDW: 15.5 % (ref 11.5–15.5)
WBC: 6.9 K/uL (ref 4.0–10.5)
nRBC: 0 % (ref 0.0–0.2)

## 2024-10-30 LAB — COMPREHENSIVE METABOLIC PANEL WITH GFR
ALT: 6 U/L (ref 0–44)
AST: 11 U/L — ABNORMAL LOW (ref 15–41)
Albumin: 3.8 g/dL (ref 3.5–5.0)
Alkaline Phosphatase: 104 U/L (ref 38–126)
Anion gap: 10 (ref 5–15)
BUN: 22 mg/dL (ref 8–23)
CO2: 24 mmol/L (ref 22–32)
Calcium: 9.3 mg/dL (ref 8.9–10.3)
Chloride: 102 mmol/L (ref 98–111)
Creatinine, Ser: 1.4 mg/dL — ABNORMAL HIGH (ref 0.61–1.24)
GFR, Estimated: 50 mL/min — ABNORMAL LOW (ref >60)
Glucose, Bld: 127 mg/dL — ABNORMAL HIGH (ref 70–99)
Potassium: 4.4 mmol/L (ref 3.5–5.1)
Sodium: 136 mmol/L (ref 135–145)
Total Bilirubin: 0.5 mg/dL (ref 0.0–1.2)
Total Protein: 6.1 g/dL — ABNORMAL LOW (ref 6.5–8.1)

## 2024-10-30 LAB — PHOSPHORUS: Phosphorus: 4 mg/dL (ref 2.5–4.6)

## 2024-10-30 LAB — MAGNESIUM: Magnesium: 2.3 mg/dL (ref 1.7–2.4)

## 2024-10-30 MED ORDER — CHLORHEXIDINE GLUCONATE CLOTH 2 % EX PADS
6.0000 | MEDICATED_PAD | Freq: Every day | CUTANEOUS | Status: DC
  Administered 2024-10-30: 6 via TOPICAL

## 2024-10-30 MED ORDER — FINASTERIDE 5 MG PO TABS: 5.0000 mg | ORAL_TABLET | Freq: Every day | ORAL | 0 refills | Status: AC

## 2024-10-30 MED ORDER — TAMSULOSIN HCL 0.4 MG PO CAPS: 0.4000 mg | ORAL_CAPSULE | Freq: Every day | ORAL | 11 refills | Status: AC

## 2024-10-30 NOTE — Progress Notes (Signed)
 PROGRESS NOTE    Travis Woodward.  FMW:[id number] DOB: January 16, 1941 DOA: 10/28/2024 PCP: Orpha Yancey LABOR, MD   Brief Narrative:  Travis Woodward. is a 83 y.o. male with medical history significant of HTN, stage 3 CKD, BPH, who presents with hematuria.  He presented in the ER on 10/28 with difficulty urinating.  He attempted to straight cath, but just got frank blood.  He was having a little bit of suprapubic abdominal discomfort but no pain.  His bladder was irrigated and it appeared that his hematuria had stopped.  He was discharged to home with Foley catheter in place to follow-up with urology.  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  He woke up this morning with frank blood in his urine and return to the hospital for evaluation.  Here, irrigation was attempted, but the hematuria did not stop.  Urology was consulted who recommended that the patient be admitted down to [facility] long and that they would evaluate him.    **Interim History  Continued to have gross hematuria and his anticoagulation continues to be held.  HadCBI and getting Foley catheter irrigation.  Urology consulted and they took him to the OR for clot evacuation and fulguration of his bladder.  His gross hematuria improved and he developed good urine following the clot EVAC and fulguration.  Foley catheter was removed and urology recommended holding antiplatelets and anticoagulation for at least 24 hours after patient has been free of bleeding.  They recommended the patient go back to urinary tension that the patient is have a comfortable 18 French coud catheter placed.  Assessment and Plan:  Gross Hematuria / BPH: Admitted to [facility] and has 3 way Catheter and undergoing CBI. Holding Anticoagulation. CBC trend as below. C/w Tamsulosin  0.4 mg po Daily. Urology consulted and appreciate further evaluation and recc's; Type and Screen and Transfuse for Hgb <7.  Patient was taken to the OR on 09/28/2024 for  clot EVAC and fulguration  History of DVT on anticoagulation: Hold Anticoagulation with Apixaban ; Last dose was AM of 11/1; Also holding ASA 81 mg po Daily and clopidogrel  as well allegedly.  Per urology hold anticoagulation and antiplatelets at least 24 hours and then we will try if it is reasonable; after further investigation the patient is not supposed to be on triple therapy despite it being filled for him.  He is only supposed to be on Eliquis  and aspirin  and his Plavix  was supposed to be stopped last November.  Will not resume clopidogrel  at discharge as it was only prescribed last year when he had the acute right iliofemoral DVT and underwent a penumbra thrombectomy and right common iliac vein stenting  Chronic pain: Continue chronic pain meds w/ Oxycodone  15 mg po q8hprn and C/w Fentanyl  12.5-25 mcg IV q2hprn Severe Pain  Essential HTN: Not on any antihypertensives. CTM BP per Protocol. Last BP reading is a little soft at 101/54  ABLA in the setting of Hematuria superimposed on Normocytic Anemia: Hgb/Hct Trend: Recent Labs  Lab 10/28/24 1533 10/28/24 2119 10/29/24 0513 10/30/24 0445  HGB 10.9* 10.6* 10.1* 9.4*  HCT 33.7* 33.8* 32.0* 30.2*  MCV 93.9 93.9 94.4 94.1  -Check Anemia Panel in the AM. CTM for S/Sx of Bleeding; Having gross hematuria and bloody Urine currently. 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  He is supposed to continue aspirin  Plavix  and Eliquis  for 1 month and discontinue Plavix  and continue aspirin  and Eliquis  alone eventually however it appears that he may be taking this for over a year.  CKD Stage 3a: BUN/Cr Trend: Recent Labs  Lab 10/28/24 1533 10/29/24 0513 10/30/24 0445  BUN 21 22 22   CREATININE 1.28* 1.31* 1.40*  -Avoid Nephrotoxic Medications, Contrast Dyes, Hypotension and Dehydration to Ensure Adequate Renal Perfusion and will need to Renally  Adjust Meds. CTM and Trend Renal Function carefully and repeat CMP in the AM   Thrombocytopenia: Plt Count Trend went from 105 -> 119 -> 111 and is now 101. Having Gross Hematuria as above. CTM and trend and repeat CBC in the AM  GERD/GI Prophylaxis: Does not appear to be on on any PPI or H2 Blocker but if necessary will place him on one  DVT prophylaxis: SCDs Start: 10/28/24 2103    Code Status: Full Code Family Communication: No family present @ bedside   Disposition Plan:  Level of care: Med-Surg Status is: Inpatient Remains inpatient appropriate because: Needs further clinical improvement and clearance by specialists   Consultants:  Urology  Procedures:  As delineated as above  Antimicrobials:  Anti-infectives (From admission, onward)    None       Subjective: Seen and examined at bedside and his hematuria has resolved and he thinks the pressure in his abdomen is improved.  No nausea or vomiting.  Denies lightheadedness or dizziness.  Feels okay otherwise.  About to have the catheter taken out.  No other concerns or complaints at this time.  Objective: Vitals:   10/30/24 0347 10/30/24 0743 10/30/24 1130 10/30/24 1514  BP: 106/65 111/61 (!) 108/51 (!) 101/54  Pulse: 79 84 77 73  Resp: 12 15 15    Temp: 97.7 F (36.5 C) 98.6 F (37 C) 97.9 F (36.6 C) 98.2 F (36.8 C)  TempSrc: Oral Oral Oral Oral  SpO2: 97% 97% 99% 99%  Weight:      Height:        Intake/Output Summary (Last 24 hours) at 10/30/2024 1840 Last data filed at 10/30/2024 1642 Gross per 24 hour  Intake 5090 ml  Output 6000 ml  Net -910 ml   Filed Weights   10/28/24 1235  Weight: 69.9 kg   Examination: Physical Exam:  Constitutional: WN/WD elderly chronically ill-appearing Caucasian male in no acute distress Respiratory: Diminished to auscultation bilaterally, no wheezing, rales, rhonchi or crackles. Normal respiratory effort and patient is not tachypenic. No accessory muscle use.  Unlabored  breathing Cardiovascular: RRR, no murmurs / rubs / gallops. S1 and S2 auscultated. No extremity edema.  Abdomen: Soft, non-tender, non-distended. Bowel sounds positive.  GU: Deferred.  He has a catheter in place and is draining clear yellow urine Musculoskeletal: No clubbing / cyanosis of digits/nails. No joint deformity upper and lower extremities Skin: No rashes, lesions, ulcers. No induration; Warm and dry.  Neurologic: CN 2-12 grossly intact with no focal deficits. Romberg sign and cerebellar reflexes not assessed.  Psychiatric: Normal judgment and insight. Alert and oriented x 3. Normal mood and appropriate affect.   Data Reviewed: I have personally reviewed following labs and imaging studies  CBC: Recent Labs  Lab 10/28/24 1533 10/28/24 2119 10/29/24 0513 10/30/24 0445  WBC 6.4 8.5 8.1 6.9  NEUTROABS 4.7  --   --  5.8  HGB 10.9* 10.6* 10.1* 9.4*  HCT 33.7* 33.8* 32.0* 30.2*  MCV 93.9 93.9 94.4 94.1  PLT  105* 119* 111* 101*   Basic Metabolic Panel: Recent Labs  Lab 10/28/24 1533 10/29/24 0513 10/30/24 0445  NA 138 137 136  K 4.2 4.4 4.4  CL 102 102 102  CO2 26 28 24   GLUCOSE 109* 96 127*  BUN 21 22 22   CREATININE 1.28* 1.31* 1.40*  CALCIUM 9.2 9.4 9.3  MG  --   --  2.3  PHOS  --   --  4.0   GFR: Estimated Creatinine Clearance: 39.5 mL/min (A) (by C-G formula based on SCr of 1.4 mg/dL (H)). Liver Function Tests: Recent Labs  Lab 10/28/24 1533 10/30/24 0445  AST 12* 11*  ALT 8 6  ALKPHOS 107 104  BILITOT 0.6 0.5  PROT 6.4* 6.1*  ALBUMIN 4.0 3.8   No results for input(s): LIPASE, AMYLASE in the last 168 hours. No results for input(s): AMMONIA in the last 168 hours. Coagulation Profile: Recent Labs  Lab 10/28/24 1533 10/29/24 0513  INR 1.2 1.2   Cardiac Enzymes: No results for input(s): CKTOTAL, CKMB, CKMBINDEX, TROPONINI in the last 168 hours. BNP (last 3 results) No results for input(s): PROBNP in the last 8760 hours. HbA1C: No  results for input(s): HGBA1C in the last 72 hours. CBG: No results for input(s): GLUCAP in the last 168 hours. Lipid Profile: No results for input(s): CHOL, HDL, LDLCALC, TRIG, CHOLHDL, LDLDIRECT in the last 72 hours. Thyroid  Function Tests: No results for input(s): TSH, T4TOTAL, FREET4, T3FREE, THYROIDAB in the last 72 hours. Anemia Panel: No results for input(s): VITAMINB12, FOLATE, FERRITIN, TIBC, IRON, RETICCTPCT in the last 72 hours. Sepsis Labs: No results for input(s): PROCALCITON, LATICACIDVEN in the last 168 hours.  Recent Results (from the past 240 hours)  Urine Culture     Status: None   Collection Time: 10/24/24  3:40 PM   Specimen: Urine, Catheterized  Result Value Ref Range Status   Specimen Description   Final    URINE, CATHETERIZED Performed at [facility], [address], [place], [place] [address]    Special Requests   Final    NONE Performed at [facility], [address], [place], [place] [address]    Culture   Final    NO GROWTH Performed at [facility], [address] [place], [place] [address]    Report Status 10/25/2024 FINAL  Final  Surgical pcr screen     Status: None   Collection Time: 10/29/24  7:27 PM   Specimen: Nasal Mucosa; Nasal Swab  Result Value Ref Range Status   MRSA, PCR NEGATIVE NEGATIVE Final   Staphylococcus aureus NEGATIVE NEGATIVE Final    Comment: (NOTE) The Xpert SA Assay (FDA approved for NASAL specimens in patients 55 years of age and older), is one component of a comprehensive surveillance program. It is not intended to diagnose infection nor to guide or monitor treatment. Performed at [facility], [address] [place], [place] [address]     Radiology Studies: No results found.  Scheduled Meds:  Chlorhexidine  Gluconate Cloth  6 each Topical Daily   docusate sodium   100 mg Oral QHS   finasteride   5 mg Oral Daily   polyethylene glycol  17 g Oral  QHS   tamsulosin   0.4 mg Oral Daily   Continuous Infusions:   LOS: 2 days   Alejandro Marker, DO [facility] Available via Epic secure chat 7am-7pm After these hours, please refer to coverage provider listed on [email or web address] 10/30/2024, 6:40 PM

## 2024-10-30 NOTE — Progress Notes (Signed)
 Mobility Specialist - Progress Note   10/30/24 1335  Mobility  Activity Ambulated with assistance  Level of Assistance Standby assist, set-up cues, supervision of patient - no hands on  Assistive Device Front wheel walker  Distance Ambulated (ft) 500 ft  Range of Motion/Exercises Active  Activity Response Tolerated well  Mobility Referral Yes  Mobility visit 1 Mobility  Mobility Specialist Start Time (ACUTE ONLY) 1320  Mobility Specialist Stop Time (ACUTE ONLY) 1335  Mobility Specialist Time Calculation (min) (ACUTE ONLY) 15 min   Pt was found in bed and agreeable to ambulate. No complaints. At EOS returned to bed with all needs met. Call bell in reach and wife in room.   Erminio Leos,  Mobility Specialist Can be reached via Secure Chat

## 2024-10-30 NOTE — Telephone Encounter (Signed)
 Ok per MD Nieves

## 2024-10-30 NOTE — Progress Notes (Addendum)
 1 Day Post-Op Subjective: First time meeting Travis Woodward this morning.  He was sitting up in bed having breakfast.  We reviewed his case and plan and all questions were answered to his satisfaction.  Objective: Vital signs in last 24 hours: Temp:  [97.7 F (36.5 C)-98.6 F (37 C)] 98.6 F (37 C) (11/03 0743) Pulse Rate:  [67-84] 84 (11/03 0743) Resp:  [12-20] 15 (11/03 0743) BP: (105-142)/(52-73) 111/61 (11/03 0743) SpO2:  [95 %-100 %] 97 % (11/03 0743)  Assessment/Plan: 9y male, followed by Dr. Nieves, matilda BPH on chronic anticoagulation with clot obstruction of urine, s/p clot EVAC and fulguration.  # Clot obstruction of urine # Severe BPH # Gross hematuria # Chronic DAPT and anticoagulation  Clear yellow urine following clot EVAC and fulguration with Dr. Georganne on 09/28/2024.  Difficult position regarding his voiding trial.  Considering patient's very large prostate size, intraoperative urethral dilation, and history of urinary retention, a good argument could be made for discharging home with Foley catheter.  However in review of the notes it appears that his bleeding was likely coming from posterior bladder neck varices and patient needs to restart his comprehensive antiplatelet and anticoagulant regimen out of concern for his cardiac stents.  I would prefer for the hematuria catheter to be removed prior to restarting the blood thinners so we will proceed with a voiding trial today. Fill and pull.  Should patient go back into urinary retention, recommend placing a more comfortable 25F coud catheter.  If medically reasonable, okay to trial antiplatelets/anticoagulants after patient has been free of bleeding for at least 24 hours.  If patient is in any significant risk by holding of these medications, proceed as directed by cardiology  Urology will follow peripherally  Intake/Output from previous day: 11/02 0701 - 11/03 0700 In: 5340 [P.O.:490; IV Piggyback:50] Out: 80749  [Urine:19250]  Intake/Output this shift: No intake/output data recorded.  Physical Exam:  General: Alert and oriented CV: No cyanosis Lungs: equal chest rise Abdomen: Soft, NTND, no rebound or guarding Gu: CBI clamped.  Three-way Foley catheter draining clear yellow urine.  Lab Results: Recent Labs    10/28/24 2119 10/29/24 0513 10/30/24 0445  HGB 10.6* 10.1* 9.4*  HCT 33.8* 32.0* 30.2*   BMET Recent Labs    10/29/24 0513 10/30/24 0445  NA 137 136  K 4.4 4.4  CL 102 102  CO2 28 24  GLUCOSE 96 127*  BUN 22 22  CREATININE 1.31* 1.40*  CALCIUM 9.4 9.3  HGB 10.1* 9.4*  WBC 8.1 6.9     Studies/Results: CT Renal Stone Study Result Date: 10/28/2024 EXAM: CT ABDOMEN AND PELVIS WITHOUT CONTRAST 10/28/2024 02:37:45 PM TECHNIQUE: CT of the abdomen and pelvis was performed without the administration of intravenous contrast. Multiplanar reformatted images are provided for review. Automated exposure control, iterative reconstruction, and/or weight-based adjustment of the mA/kV was utilized to reduce the radiation dose to as low as reasonably achievable. COMPARISON: CT dated 11/14/2018. CLINICAL HISTORY: Abdominal/flank pain, stone suspected; Gross hematuria. 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 Calcified pleural plaque along the right anterior lower chest wall. LIVER: The liver is unremarkable. GALLBLADDER AND BILE DUCTS: Status post cholecystectomy. No biliary ductal dilatation. SPLEEN: No acute abnormality. PANCREAS: No acute abnormality. ADRENAL GLANDS: No acute abnormality. KIDNEYS, URETERS AND BLADDER: No stones in the kidneys or ureters. No hydronephrosis. No perinephric or periureteral stranding. Indwelling foley catheter with extensive hyperdense material around the foley likely representing blood products within the bladder. GI  AND BOWEL: Stomach demonstrates no acute abnormality. There is no bowel obstruction. PERITONEUM AND  RETROPERITONEUM: No ascites. No free air. VASCULATURE: Aorta is normal in caliber. Prior right iliac vein stenting. LYMPH NODES: No lymphadenopathy. REPRODUCTIVE ORGANS: The pelvis is poorly visualized due to streak artifact from adjacent hip arthroplasty hardware but there is likely prostatomegaly. BONES AND SOFT TISSUES: Left hip arthroplasty hardware with adjacent streak artifact, limiting evaluation somewhat. No acute osseous abnormality. No focal soft tissue abnormality. IMPRESSION: 1. Indwelling Foley catheter with extensive hyperdense material in the bladder, likely blood products. Cystoscopy may be helpful to exclude underlying mass. 2. Probable prostatomegaly, assessment limited by streak artifact from left hip arthroplasty hardware. Electronically signed by: Michaeline Blanch MD 10/28/2024 02:54 PM EDT RP Workstation: [id number]      LOS: 2 days   [name] Bourdon, NP [facility] Pager: [phone]  10/30/2024, 10:47 AM

## 2024-10-30 NOTE — TOC Initial Note (Signed)
 Transition of Care (TOC) - Initial/Assessment Note    Patient Details  Name: Travis Woodward. MRN: [id number] Date of Birth: 15-Mar-1941  Transition of Care [facility]) CM/SW Contact:    Bascom Service, RN Phone Number: 10/30/2024, 1:19 PM  Clinical Narrative: d/c plan home. Has own transport home.                  Expected Discharge Plan: Home/Self Care Barriers to Discharge: Continued Medical Work up   Patient Goals and CMS Choice Patient states their goals for this hospitalization and ongoing recovery are:: Home CMS Medicare.gov Compare Post Acute Care list provided to:: Patient Choice offered to / list presented to : Patient [place] ownership interest in [facility].provided to:: Patient    Expected Discharge Plan and Services                                              Prior Living Arrangements/Services                       Activities of Daily Living   ADL Screening (condition at time of admission) Independently performs ADLs?: Yes (appropriate for developmental age) Is the patient deaf or have difficulty hearing?: Yes Does the patient have difficulty seeing, even when wearing glasses/contacts?: No Does the patient have difficulty concentrating, remembering, or making decisions?: No  Permission Sought/Granted                  Emotional Assessment              Admission diagnosis:  Gross hematuria [R31.0] Hematuria [R31.9] Patient Active Problem List   Diagnosis Date Noted   Hematuria 10/28/2024   Extensive Bil DVT (deep venous thrombosis) 10/13/2023   COVID-19 07/30/2023   Palpitations 03/06/2020   Osteoporosis 10/04/2019   Hypercortisolemia 10/04/2019   Closed transcervical fracture of left femur (HCC) 06/28/2019   Hyperlipidemia 06/28/2019   Anemia 06/28/2019   Hypertension    Calculus of gallbladder without cholecystitis without obstruction    Thrombocytopenia 07/16/2015   History of appendicitis 07/16/2015    H/O ischemic heart disease 07/16/2015   H/O normocytic normochromic anemia 07/16/2015   Anxiety about health 07/16/2015   B12 deficiency 07/16/2015   CAD S/P percutaneous coronary angioplasty--09/03/2004 01/02/2015   Subcutaneous emphysema 06/25/2014   Subarachnoid hemorrhage (HCC) 06/25/2014   Pulmonary contusion 06/25/2014   IVH (intraventricular hemorrhage) (HCC) 06/25/2014   Trauma 06/25/2014   Intracranial hemorrhage (HCC) 06/25/2014   Brain injury (HCC) 06/25/2014   Coronary artery disease without angina pectoris 06/25/2014   Hypoxia 06/24/2014   GERD 06/23/2010   WEIGHT LOSS, ABNORMAL 06/23/2010   NAUSEA, CHRONIC 06/23/2010   VERTEBRAL FRACTURE, THORACIC SPINE 07/16/2008   H N P-LUMBAR 06/25/2008   LOW BACK PAIN 06/25/2008   SCIATICA 06/25/2008   LUMBAR SPASM 06/25/2008   PCP:  Orpha Yancey LABOR, MD Pharmacy:   [facility] - [place], [place] - [address] [place] [address] Phone: [phone] Fax: [phone]  [id number] - [place], [place] - [address] AT [facility] [address] [place] [address] Phone: [phone] Fax: [phone]     Social Drivers of Health (SDOH) Social History: SDOH Screenings   Food Insecurity: No Food Insecurity (10/28/2024)  Housing: Low Risk  (10/28/2024)  Transportation Needs: No Transportation  Needs (10/28/2024)  Utilities: Not At Risk (10/28/2024)  Social Connections: Moderately Isolated (10/28/2024)  Tobacco Use: Medium Risk (10/29/2024)   SDOH Interventions:     Readmission Risk Interventions    08/01/2023   12:59 PM  Readmission Risk Prevention Plan  Post Dischage Appt Complete  Medication Screening Complete  Transportation Screening Complete

## 2024-10-31 ENCOUNTER — Telehealth: Payer: Self-pay

## 2024-10-31 DIAGNOSIS — R310 Gross hematuria: Secondary | ICD-10-CM | POA: Diagnosis not present

## 2024-10-31 DIAGNOSIS — I10 Essential (primary) hypertension: Secondary | ICD-10-CM | POA: Diagnosis not present

## 2024-10-31 DIAGNOSIS — I82593 Chronic embolism and thrombosis of other specified deep vein of lower extremity, bilateral: Secondary | ICD-10-CM

## 2024-10-31 DIAGNOSIS — K219 Gastro-esophageal reflux disease without esophagitis: Secondary | ICD-10-CM | POA: Diagnosis not present

## 2024-10-31 LAB — CBC WITH DIFFERENTIAL/PLATELET
Abs Immature Granulocytes: 0.03 K/uL (ref 0.00–0.07)
Basophils Absolute: 0.1 K/uL (ref 0.0–0.1)
Basophils Relative: 1 %
Eosinophils Absolute: 0.1 K/uL (ref 0.0–0.5)
Eosinophils Relative: 2 %
HCT: 29.3 % — ABNORMAL LOW (ref 39.0–52.0)
Hemoglobin: 9.2 g/dL — ABNORMAL LOW (ref 13.0–17.0)
Immature Granulocytes: 0 %
Lymphocytes Relative: 17 %
Lymphs Abs: 1.4 K/uL (ref 0.7–4.0)
MCH: 29.7 pg (ref 26.0–34.0)
MCHC: 31.4 g/dL (ref 30.0–36.0)
MCV: 94.5 fL (ref 80.0–100.0)
Monocytes Absolute: 0.8 K/uL (ref 0.1–1.0)
Monocytes Relative: 10 %
Neutro Abs: 5.8 K/uL (ref 1.7–7.7)
Neutrophils Relative %: 70 %
Platelets: 97 K/uL — ABNORMAL LOW (ref 150–400)
RBC: 3.1 MIL/uL — ABNORMAL LOW (ref 4.22–5.81)
RDW: 15.8 % — ABNORMAL HIGH (ref 11.5–15.5)
WBC: 8.3 K/uL (ref 4.0–10.5)
nRBC: 0 % (ref 0.0–0.2)

## 2024-10-31 LAB — PHOSPHORUS: Phosphorus: 3.1 mg/dL (ref 2.5–4.6)

## 2024-10-31 LAB — COMPREHENSIVE METABOLIC PANEL WITH GFR
ALT: <5 U/L (ref 0–44)
AST: 12 U/L — ABNORMAL LOW (ref 15–41)
Albumin: 3.6 g/dL (ref 3.5–5.0)
Alkaline Phosphatase: 106 U/L (ref 38–126)
Anion gap: 10 (ref 5–15)
BUN: 26 mg/dL — ABNORMAL HIGH (ref 8–23)
CO2: 27 mmol/L (ref 22–32)
Calcium: 9.4 mg/dL (ref 8.9–10.3)
Chloride: 100 mmol/L (ref 98–111)
Creatinine, Ser: 1.41 mg/dL — ABNORMAL HIGH (ref 0.61–1.24)
GFR, Estimated: 49 mL/min — ABNORMAL LOW (ref >60)
Glucose, Bld: 113 mg/dL — ABNORMAL HIGH (ref 70–99)
Potassium: 3.9 mmol/L (ref 3.5–5.1)
Sodium: 138 mmol/L (ref 135–145)
Total Bilirubin: 0.5 mg/dL (ref 0.0–1.2)
Total Protein: 6.1 g/dL — ABNORMAL LOW (ref 6.5–8.1)

## 2024-10-31 LAB — MAGNESIUM: Magnesium: 2.3 mg/dL (ref 1.7–2.4)

## 2024-10-31 MED ORDER — APIXABAN 5 MG PO TABS: 5.0000 mg | ORAL_TABLET | Freq: Two times a day (BID) | ORAL | Status: AC

## 2024-10-31 MED ORDER — ONDANSETRON HCL 4 MG PO TABS: 4.0000 mg | ORAL_TABLET | Freq: Four times a day (QID) | ORAL | 0 refills | Status: AC | PRN

## 2024-10-31 MED ORDER — ASPIRIN 81 MG PO TBEC
81.0000 mg | DELAYED_RELEASE_TABLET | Freq: Every day | ORAL | Status: DC
  Administered 2024-10-31: 81 mg via ORAL
  Filled 2024-10-31: qty 1

## 2024-10-31 MED ORDER — APIXABAN 5 MG PO TABS
5.0000 mg | ORAL_TABLET | Freq: Two times a day (BID) | ORAL | Status: DC
  Administered 2024-10-31: 5 mg via ORAL
  Filled 2024-10-31: qty 1

## 2024-10-31 NOTE — Progress Notes (Signed)
 S: Patient feeling well.  Voiding well.  O: He looks well, eating breakfast Urine clear in urinal  A/P: BPH with gross hematuria-as suspected patient did not start finasteride  as prescribed and recommended in December 2024 and he did not follow-up in June as planned.  Again we went over the nature risk benefits and alternatives to 5 alpha reductase inhibitor and this is the only medication that can help prevent this issue.  We discussed FDA warnings.  We also discussed procedures.  All questions answered. He will continue finasteride  (added this admission).  I had the office send a prescription to his pharmacy yesterday.  Voiding well.  He should continue tamsulosin  and finasteride .  Message sent for follow-up.

## 2024-10-31 NOTE — Progress Notes (Signed)
 OT Cancellation Note  Patient Details Name: Travis Woodward. MRN: [id number] DOB: 01/19/1941   Cancelled Treatment:    Reason Eval/Treat Not Completed: OT screened, no needs identified, will sign off (Discussed with PT; patient at baseline)   Belvie B. Raiyah Speakman, MS, OTR/L  10/31/2024, 5:50 PM

## 2024-10-31 NOTE — Plan of Care (Signed)

## 2024-10-31 NOTE — Discharge Summary (Signed)
 Physician Discharge Summary   Patient: Travis Woodward. MRN: [id number] DOB: [birth year]  Admit date:     10/28/2024  Discharge date: 10/31/2024  Discharge Physician: Alejandro Marker, DO   PCP: Orpha Yancey LABOR, MD   Recommendations at discharge:   Follow-up with PCP within 1 to 2 weeks repeat CBC, CMP, mag, Phos within 1 week Follow-up with urology outpatient setting continue to monitor for signs and symptoms of hematuria given that patient is being placed back on aspirin  and Eliquis   Discharge Diagnoses: Principal Problem:   Hematuria Active Problems:   Extensive Bil DVT (deep venous thrombosis)   GERD   LOW BACK PAIN   Hypertension  Resolved Problems:   * No resolved hospital problems. *  Hospital Course: Travis Woodward. is a 83 y.o. male with medical history significant of HTN, stage 3 CKD, BPH, who presents with hematuria.  He presented in the ER on 10/28 with difficulty urinating.  He attempted to straight cath, but just got frank blood.  He was having a little bit of suprapubic abdominal discomfort but no pain.  His bladder was irrigated and it appeared that his hematuria had stopped.  He was discharged to home with Foley catheter in place to follow-up with urology.  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  He woke up this morning with frank blood in his urine and return to the hospital for evaluation.  Here, irrigation was attempted, but the hematuria did not stop.  Urology was consulted who recommended that the patient be admitted down to [facility] long and that they would evaluate him.    **Interim History  Continued to have gross hematuria and his anticoagulation was held.  HadCBI and getting Foley catheter irrigation.  Urology consulted and they took him to the OR for clot evacuation and fulguration of his bladder.  His gross hematuria improved and resolved and he developed good urine following the clot EVAC and fulguration.  Foley catheter  was removed and urology recommended holding antiplatelets and anticoagulation for at least 24 hours after patient has been free of bleeding.  He is improved and medically stable and urology cleared him and recommended finasteride  and tamsulosin .  Given that his hematuria is improved and and I am further gross hematuria the urology team is recommending going back on his antiplatelet and anticoagulant with close monitoring.  Assessment and Plan:  Gross Hematuria / BPH: Admitted to [facility] and has 3 way Catheter and undergoing CBI. Holding Anticoagulation. CBC trend as below. C/w Tamsulosin  0.4 mg po Daily. Urology consulted and appreciate further evaluation and recc's; Type and Screen and Transfuse for Hgb <7.  Patient was taken to the OR on 09/28/2024 for clot EVAC and fulguration and his hematuria has improved.  Urology cleared the patient for discharge and recommending finasteride  and tamsulosin .  After discussion with Dr. Nieves he recommends that the patient can go back on his antiplatelet with aspirin  81 mg and apixaban  with close monitoring outpatient setting  History of DVT on anticoagulation: Hold Anticoagulation with Apixaban ; Last dose was AM of 11/1; Also holding ASA 81 mg po Daily and clopidogrel  as well allegedly.  Per urology hold anticoagulation and antiplatelets at least 24 hours and then we will try if it is reasonable; after further investigation the patient is not supposed to be on triple therapy despite it being filled for him.  He is only supposed to be on Eliquis  and aspirin  and  his Plavix  was supposed to be stopped last November.  Will not resume clopidogrel  at discharge as it was only prescribed last year when he had the acute right iliofemoral DVT and underwent a penumbra thrombectomy and right common iliac vein stenting -Resume Eliquis  and aspirin  at discharge  Chronic pain: Continue chronic pain meds w/ Oxycodone  15 mg po q8hprn and C/w Fentanyl  12.5-25 mcg IV q2hprn Severe  Pain  Essential HTN: Not on any antihypertensives. CTM BP per Protocol. Last BP reading is a little soft at 99/58  ABLA in the setting of Hematuria superimposed on Normocytic Anemia: Hgb/Hct Trend: Recent Labs  Lab 10/28/24 1533 10/28/24 2119 10/29/24 0513 10/30/24 0445 10/31/24 0916  HGB 10.9* 10.6* 10.1* 9.4* 9.2*  HCT 33.7* 33.8* 32.0* 30.2* 29.3*  MCV 93.9 93.9 94.4 94.1 94.5  -Check Anemia Panel in the outpatient setting. CTM for S/Sx of Bleeding; Having gross hematuria and bloody Urine currently. Repeat CBC in the the AM  History of CAD status post stenting of his LAD: Patient had extensive bilateral distal DVTs that was noted a year ago.  He is supposed to continue aspirin  Plavix  and Eliquis  for 1 month and discontinue Plavix  and continue aspirin  and Eliquis  alone eventually however it appears that he may be taking this for over a year.  Will just discharge him back on his aspirin  and Eliquis   CKD Stage 3a: BUN/Cr Trend fairly stable but mildly elevated: Recent Labs  Lab 10/28/24 1533 10/29/24 0513 10/30/24 0445 10/31/24 0916  BUN 21 22 22  26*  CREATININE 1.28* 1.31* 1.40* 1.41*  -Avoid Nephrotoxic Medications, Contrast Dyes, Hypotension and Dehydration to Ensure Adequate Renal Perfusion and will need to Renally Adjust Meds. CTM and Trend Renal Function carefully and repeat CMP in the AM   Thrombocytopenia: Plt Count Trend went from 105 -> 119 -> 111 -> 101 -> 97. Having Gross Hematuria is improved significantly. CTM and trend and repeat CBC in the AM  GERD/GI Prophylaxis: Does not appear to be on on any PPI or H2 Blocker but if necessary will place him on one  Consultants: Urology Procedures performed: As delineated as above Disposition: Home Diet recommendation:  Discharge Diet Orders (From admission, onward)     Start     Ordered   10/31/24 0000  Diet - low sodium heart healthy        10/31/24 1338           Cardiac diet DISCHARGE MEDICATION: Allergies as  of 10/31/2024       Reactions   Crestor [rosuvastatin] Other (See Comments)   Unknown    Paroxetine Rash        Medication List     STOP taking these medications    clopidogrel  75 MG tablet Commonly known as: PLAVIX        TAKE these medications    apixaban  5 MG Tabs tablet Commonly known as: ELIQUIS  Take 1 tablet (5 mg total) by mouth 2 (two) times daily. What changed: See the new instructions.   aspirin  EC 81 MG tablet Take 81 mg by mouth daily.   docusate sodium  100 MG capsule Commonly known as: COLACE Take 1 capsule (100 mg total) by mouth 2 (two) times daily. What changed: when to take this   finasteride  5 MG tablet Commonly known as: PROSCAR  Take 1 tablet (5 mg total) by mouth daily.   nitroGLYCERIN  0.4 MG SL tablet Commonly known as: NITROSTAT  Place 0.4 mg under the tongue every 5 (five) minutes as needed for  chest pain.   ondansetron  4 MG tablet Commonly known as: ZOFRAN  Take 1 tablet (4 mg total) by mouth every 6 (six) hours as needed for nausea.   oxyCODONE  15 MG immediate release tablet Commonly known as: ROXICODONE  Take 15 mg by mouth every 8 (eight) hours as needed for pain.   polyethylene glycol 17 g packet Commonly known as: MIRALAX  / GLYCOLAX  Take 17 g by mouth daily. What changed: when to take this   tamsulosin  0.4 MG Caps capsule Commonly known as: FLOMAX  Take 1 capsule (0.4 mg total) by mouth daily.   VITAMIN B-12 IJ Inject 1 mL as directed every 30 (thirty) days.   VITAMIN D  HIGH POTENCY PO Take 5,000 Units by mouth daily.        Discharge Exam: Filed Weights   10/28/24 1235  Weight: 69.9 kg   Vitals:   10/30/24 2307 10/31/24 0334  BP: (!) 105/53 (!) 99/58  Pulse: 76 77  Resp: 18 18  Temp: 98.1 F (36.7 C) 98.2 F (36.8 C)  SpO2: 97% 97%   Examination: Physical Exam:  Constitutional: WN/WD elderly chronically ill-appearing Caucasian male in no acute distress Respiratory: Diminished to auscultation bilaterally,  no wheezing, rales, rhonchi or crackles. Normal respiratory effort and patient is not tachypenic. No accessory muscle use.  Unlabored breathing Cardiovascular: RRR, no murmurs / rubs / gallops. S1 and S2 auscultated. No extremity edema.  Abdomen: Soft, non-tender, non-distended. Bowel sounds positive.  GU: Deferred. Musculoskeletal: No clubbing / cyanosis of digits/nails. No joint deformity upper and lower extremities.  Skin: No rashes, lesions, ulcers on limited skin evaluation. No induration; Warm and dry.  Neurologic: CN 2-12 grossly intact with no focal deficits. Romberg sign and cerebellar reflexes not assessed.  Psychiatric: Normal judgment and insight. Alert and oriented x 3. Normal mood and appropriate affect.   Condition at discharge: stable  The results of significant diagnostics from this hospitalization (including imaging, microbiology, ancillary and laboratory) are listed below for reference.   Imaging Studies: CT Renal Stone Study Result Date: 10/28/2024 EXAM: CT ABDOMEN AND PELVIS WITHOUT CONTRAST 10/28/2024 02:37:45 PM TECHNIQUE: CT of the abdomen and pelvis was performed without the administration of intravenous contrast. Multiplanar reformatted images are provided for review. Automated exposure control, iterative reconstruction, and/or weight-based adjustment of the mA/kV was utilized to reduce the radiation dose to as low as reasonably achievable. COMPARISON: CT dated 11/14/2018. CLINICAL HISTORY: Abdominal/flank pain, stone suspected; Gross hematuria. 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 Calcified pleural plaque along the right anterior lower chest wall. LIVER: The liver is unremarkable. GALLBLADDER AND BILE DUCTS: Status post cholecystectomy. No biliary ductal dilatation. SPLEEN: No acute abnormality. PANCREAS: No acute abnormality. ADRENAL GLANDS: No acute abnormality. KIDNEYS, URETERS AND BLADDER: No stones in the kidneys or ureters. No  hydronephrosis. No perinephric or periureteral stranding. Indwelling foley catheter with extensive hyperdense material around the foley likely representing blood products within the bladder. GI AND BOWEL: Stomach demonstrates no acute abnormality. There is no bowel obstruction. PERITONEUM AND RETROPERITONEUM: No ascites. No free air. VASCULATURE: Aorta is normal in caliber. Prior right iliac vein stenting. LYMPH NODES: No lymphadenopathy. REPRODUCTIVE ORGANS: The pelvis is poorly visualized due to streak artifact from adjacent hip arthroplasty hardware but there is likely prostatomegaly. BONES AND SOFT TISSUES: Left hip arthroplasty hardware with adjacent streak artifact, limiting evaluation somewhat. No acute osseous abnormality. No focal soft tissue abnormality. IMPRESSION: 1. Indwelling Foley catheter with extensive hyperdense material in the bladder, likely blood products. Cystoscopy  may be helpful to exclude underlying mass. 2. Probable prostatomegaly, assessment limited by streak artifact from left hip arthroplasty hardware. Electronically signed by: Michaeline Blanch MD 10/28/2024 02:54 PM EDT RP Workstation: [id number]   Microbiology: Results for orders placed or performed during the hospital encounter of 10/28/24  Surgical pcr screen     Status: None   Collection Time: 10/29/24  7:27 PM   Specimen: Nasal Mucosa; Nasal Swab  Result Value Ref Range Status   MRSA, PCR NEGATIVE NEGATIVE Final   Staphylococcus aureus NEGATIVE NEGATIVE Final    Comment: (NOTE) The Xpert SA Assay (FDA approved for NASAL specimens in patients 64 years of age and older), is one component of a comprehensive surveillance program. It is not intended to diagnose infection nor to guide or monitor treatment. Performed at [facility], [address] [place], [place] [address]    Labs: CBC: Recent Labs  Lab 10/28/24 1533 10/28/24 2119 10/29/24 0513 10/30/24 0445 10/31/24 0916  WBC 6.4 8.5 8.1 6.9  8.3  NEUTROABS 4.7  --   --  5.8 5.8  HGB 10.9* 10.6* 10.1* 9.4* 9.2*  HCT 33.7* 33.8* 32.0* 30.2* 29.3*  MCV 93.9 93.9 94.4 94.1 94.5  PLT 105* 119* 111* 101* 97*   Basic Metabolic Panel: Recent Labs  Lab 10/28/24 1533 10/29/24 0513 10/30/24 0445 10/31/24 0916  NA 138 137 136 138  K 4.2 4.4 4.4 3.9  CL 102 102 102 100  CO2 26 28 24 27   GLUCOSE 109* 96 127* 113*  BUN 21 22 22  26*  CREATININE 1.28* 1.31* 1.40* 1.41*  CALCIUM 9.2 9.4 9.3 9.4  MG  --   --  2.3 2.3  PHOS  --   --  4.0 3.1   Liver Function Tests: Recent Labs  Lab 10/28/24 1533 10/30/24 0445 10/31/24 0916  AST 12* 11* 12*  ALT 8 6 <5  ALKPHOS 107 104 106  BILITOT 0.6 0.5 0.5  PROT 6.4* 6.1* 6.1*  ALBUMIN 4.0 3.8 3.6   CBG: No results for input(s): GLUCAP in the last 168 hours.  Discharge time spent: greater than 30 minutes.  Signed: Alejandro Marker, DO [facility] 10/31/2024

## 2024-10-31 NOTE — Evaluation (Signed)
 Physical Therapy Evaluation Patient Details Name: Travis Woodward. MRN: [id number] DOB: [birth year] Today's Date: 10/31/2024  History of Present Illness  Pt is 83 yo male admitted with gross hematuria/BPH.  Pt s/p clot EVAC and fluguration on 09/28/24.  Pt with hx including but not limited to HTN, CKD, BPH  Clinical Impression  Pt admitted with above diagnosis.  He has home support and all necessary DME.  Today, pt ambulating 400' with RW and supervision at normal to fast speed.  He does prefer to be forward flexed some with ambulation due to fear of falling posteriorly, but did demonstrate safe gait during therapy and feels to be at baseline.  No further PT needs at this time.         If plan is discharge home, recommend the following: Help with stairs or ramp for entrance   Can travel by private vehicle        Equipment Recommendations None recommended by PT  Recommendations for Other Services       Functional Status Assessment Patient has not had a recent decline in their functional status     Precautions / Restrictions Precautions Precautions: Fall      Mobility  Bed Mobility Overal bed mobility: Modified Independent                  Transfers Overall transfer level: Needs assistance Equipment used: Rolling walker (2 wheels) Transfers: Sit to/from Stand Sit to Stand: Supervision           General transfer comment: min cues for hand placement; STS wtihout difficulty    Ambulation/Gait Ambulation/Gait assistance: Supervision Gait Distance (Feet): 400 Feet Assistive device: Rolling walker (2 wheels) Gait Pattern/deviations: Step-through pattern, Trunk flexed Gait velocity: normal to fast     General Gait Details: Pt ambulating with forward flexed trunk and walker lower.  He did let therapist raise 2 notches and reports feels some better but prefers to stay somewhat forward and he is afraid of falling backward if stands upright.  Pt ambulating 400' wtih RW  and supervision, no LOB, near baseline per pt and family.  Did provide cues for improved RW proximity and posture but pt prefers his method and was steady.  Stairs            Wheelchair Mobility     Tilt Bed    Modified Rankin (Stroke Patients Only)       Balance Overall balance assessment: Needs assistance Sitting-balance support: No upper extremity supported Sitting balance-Leahy Scale: Good     Standing balance support: Bilateral upper extremity supported Standing balance-Leahy Scale: Poor Standing balance comment: needs RW                             Pertinent Vitals/Pain Pain Assessment Pain Assessment: No/denies pain    Home Living Family/patient expects to be discharged to:: Private residence Living Arrangements: Spouse/significant other Available Help at Discharge: Family;Available 24 hours/day (wife plus son nearby) Type of Home: House Home Access: Ramped entrance       Home Layout: Laundry or work area in basement;Other (Comment) (pt drives around in golf cart to get to his basement; does not do steps) Home Equipment: Rolling Walker (2 wheels);Crutches;Cane - single point;Shower seat;Grab bars - tub/shower;Grab bars - toilet      Prior Function Prior Level of Function : Independent/Modified Independent;Driving             Mobility Comments: Ambulates  with cane or walking stick typically but using RW lately; can ambulate in community ADLs Comments: independent with adls and iadls     Extremity/Trunk Assessment   Upper Extremity Assessment Upper Extremity Assessment: Overall WFL for tasks assessed    Lower Extremity Assessment Lower Extremity Assessment: Overall WFL for tasks assessed    Cervical / Trunk Assessment Cervical / Trunk Assessment: Kyphotic  Communication        Cognition Arousal: Alert Behavior During Therapy: WFL for tasks assessed/performed   PT - Cognitive impairments: No apparent impairments                                  Cueing       General Comments General comments (skin integrity, edema, etc.): On RA wtih sats 97%    Exercises     Assessment/Plan    PT Assessment Patient does not need any further PT services  PT Problem List         PT Treatment Interventions      PT Goals (Current goals can be found in the Care Plan section)  Acute Rehab PT Goals Patient Stated Goal: return home soon PT Goal Formulation: All assessment and education complete, DC therapy    Frequency       Co-evaluation               AM-PAC PT 6 Clicks Mobility  Outcome Measure Help needed turning from your back to your side while in a flat bed without using bedrails?: None Help needed moving from lying on your back to sitting on the side of a flat bed without using bedrails?: None Help needed moving to and from a bed to a chair (including a wheelchair)?: None Help needed standing up from a chair using your arms (e.g., wheelchair or bedside chair)?: None Help needed to walk in hospital room?: A Little Help needed climbing 3-5 steps with a railing? : A Little 6 Click Score: 22    End of Session Equipment Utilized During Treatment: Gait belt Activity Tolerance: Patient tolerated treatment well Patient left: in bed;with call bell/phone within reach;with bed alarm set;with family/visitor present Nurse Communication: Mobility status PT Visit Diagnosis: Other abnormalities of gait and mobility (R26.89)    Time: 8782-8766 PT Time Calculation (min) (ACUTE ONLY): 16 min   Charges:   PT Evaluation $PT Eval Low Complexity: 1 Low   PT General Charges $$ ACUTE PT VISIT: 1 Visit         Benjiman, PT Acute Rehab Services [facility] [phone]   Benjiman VEAR Mulberry 10/31/2024, 12:41 PM

## 2024-10-31 NOTE — Telephone Encounter (Signed)
-----   Message from Donnice Brooks sent at 10/31/2024  8:36 AM EST ----- Needs a NV for a PVR in 1-2 weeks. See me next available OV for PVR. Thanks.

## 2024-10-31 NOTE — Telephone Encounter (Signed)
 Called pt to schedule f/u per MD Nieves pt scheduled for NV and OV pt confirmed date of NV and okay'd to received f/u date at NV appt

## 2024-10-31 NOTE — TOC Transition Note (Signed)
 Transition of Care [facility]) - Discharge Note   Patient Details  Name: Mathan Darroch. MRN: [id number] Date of Birth: Apr 13, 1941  Transition of [facility]) CM/SW Contact:  Bascom Service, RN Phone Number: 10/31/2024, 1:53 PM   Clinical Narrative:  d/c home No CM needs.     Final next level of care: Home/Self Care Barriers to Discharge: No Barriers Identified   Patient Goals and CMS Choice Patient states their goals for this hospitalization and ongoing recovery are:: Home CMS Medicare.gov Compare Post Acute Care list provided to:: Patient Choice offered to / list presented to : Patient [place] ownership interest in [facility]provided to:: Patient    Discharge Placement                       Discharge Plan and Services Additional resources added to the After Visit Summary for                                       Social Drivers of Health (SDOH) Interventions SDOH Screenings   Food Insecurity: No Food Insecurity (10/28/2024)  Housing: Low Risk  (10/28/2024)  Transportation Needs: No Transportation Needs (10/28/2024)  Utilities: Not At Risk (10/28/2024)  Social Connections: Moderately Isolated (10/28/2024)  Tobacco Use: Medium Risk (10/29/2024)     Readmission Risk Interventions    08/01/2023   12:59 PM  Readmission Risk Prevention Plan  Post Dischage Appt Complete  Medication Screening Complete  Transportation Screening Complete

## 2024-11-02 ENCOUNTER — Telehealth: Payer: Self-pay

## 2024-11-02 ENCOUNTER — Emergency Department (HOSPITAL_COMMUNITY)
Admission: EM | Admit: 2024-11-02 | Discharge: 2024-11-02 | Disposition: A | Discharge status: Home / Self Care | Source: Home / Self Care | Attending: Emergency Medicine | Admitting: Emergency Medicine

## 2024-11-02 ENCOUNTER — Emergency Department (HOSPITAL_COMMUNITY)
Admission: EM | Admit: 2024-11-02 | Discharge: 2024-11-02 | Disposition: A | Discharge status: Home / Self Care | Attending: Emergency Medicine | Admitting: Emergency Medicine

## 2024-11-02 ENCOUNTER — Encounter (HOSPITAL_COMMUNITY): Payer: Self-pay | Admitting: Emergency Medicine

## 2024-11-02 ENCOUNTER — Other Ambulatory Visit: Payer: Self-pay

## 2024-11-02 DIAGNOSIS — R1030 Lower abdominal pain, unspecified: Secondary | ICD-10-CM | POA: Diagnosis not present

## 2024-11-02 DIAGNOSIS — Z7901 Long term (current) use of anticoagulants: Secondary | ICD-10-CM | POA: Insufficient documentation

## 2024-11-02 DIAGNOSIS — Z7982 Long term (current) use of aspirin: Secondary | ICD-10-CM | POA: Insufficient documentation

## 2024-11-02 DIAGNOSIS — N189 Chronic kidney disease, unspecified: Secondary | ICD-10-CM | POA: Diagnosis not present

## 2024-11-02 DIAGNOSIS — T839XXA Unspecified complication of genitourinary prosthetic device, implant and graft, initial encounter: Secondary | ICD-10-CM

## 2024-11-02 DIAGNOSIS — Y732 Prosthetic and other implants, materials and accessory gastroenterology and urology devices associated with adverse incidents: Secondary | ICD-10-CM | POA: Insufficient documentation

## 2024-11-02 DIAGNOSIS — I2510 Atherosclerotic heart disease of native coronary artery without angina pectoris: Secondary | ICD-10-CM | POA: Diagnosis not present

## 2024-11-02 DIAGNOSIS — R339 Retention of urine, unspecified: Secondary | ICD-10-CM | POA: Insufficient documentation

## 2024-11-02 DIAGNOSIS — T83031A Leakage of indwelling urethral catheter, initial encounter: Secondary | ICD-10-CM | POA: Diagnosis present

## 2024-11-02 LAB — URINALYSIS, ROUTINE W REFLEX MICROSCOPIC
Bilirubin Urine: NEGATIVE
Glucose, UA: NEGATIVE mg/dL
Ketones, ur: NEGATIVE mg/dL
Nitrite: NEGATIVE
Protein, ur: 30 mg/dL — AB
Specific Gravity, Urine: 1.008 (ref 1.005–1.030)
pH: 6 (ref 5.0–8.0)

## 2024-11-02 MED ORDER — CEPHALEXIN 500 MG PO CAPS
500.0000 mg | ORAL_CAPSULE | Freq: Once | ORAL | Status: AC
  Administered 2024-11-02: 500 mg via ORAL
  Filled 2024-11-02: qty 1

## 2024-11-02 MED ORDER — CEPHALEXIN 500 MG PO CAPS: 500.0000 mg | ORAL_CAPSULE | Freq: Four times a day (QID) | ORAL | 0 refills | Status: DC

## 2024-11-02 NOTE — ED Provider Notes (Signed)
 [name] EMERGENCY DEPARTMENT AT [facility] Provider Note   CSN: [id number] Arrival date & time: 11/02/24  1020     Patient presents with: Urinary Retention  HPI Travis Woodward. is a 83 y.o. 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  He states that this morning around 4 AM he had the urge to urinate but could not and also endorsed bladder pressure and suprapubic pain.  States he is not able to get any urine out.  He denies nausea vomiting or diarrhea, and fever.  During his admission at [facility] did have urologic procedure to remove the clot from his bladder.    HPI     Prior to Admission medications   Medication Sig Start Date End Date Taking? Authorizing Provider  cephALEXin (KEFLEX) 500 MG capsule Take 1 capsule (500 mg total) by mouth 4 (four) times daily. 11/02/24  Yes Lang Norleen POUR, PA-C  apixaban  (ELIQUIS ) 5 MG TABS tablet Take 1 tablet (5 mg total) by mouth 2 (two) times daily. 10/31/24   Sherrill Cable Latif, DO  aspirin  EC 81 MG tablet Take 81 mg by mouth daily.    [provider]  Cholecalciferol  (VITAMIN D  HIGH POTENCY PO) Take 5,000 Units by mouth daily.    [provider]  Cyanocobalamin (VITAMIN B-12 IJ) Inject 1 mL as directed every 30 (thirty) days.     [provider]  docusate sodium  (COLACE) 100 MG capsule Take 1 capsule (100 mg total) by mouth 2 (two) times daily. Patient taking differently: Take 100 mg by mouth at bedtime. 08/26/23   Drusilla Sabas RAMAN, MD  finasteride  (PROSCAR ) 5 MG tablet Take 1 tablet (5 mg total) by mouth daily. 10/30/24   Nieves Cough, MD  nitroGLYCERIN  (NITROSTAT ) 0.4 MG SL tablet Place 0.4 mg under the tongue every 5 (five) minutes as needed for chest pain. 06/23/17   [provider]  ondansetron  (ZOFRAN ) 4 MG tablet Take 1 tablet (4 mg total) by mouth every 6 (six) hours as needed  for nausea. 10/31/24   Sherrill Cable Latif, DO  oxyCODONE  (ROXICODONE ) 15 MG immediate release tablet Take 15 mg by mouth every 8 (eight) hours as needed for pain. 10/03/24   [provider]  polyethylene glycol (MIRALAX  / GLYCOLAX ) 17 g packet Take 17 g by mouth daily. Patient taking differently: Take 17 g by mouth at bedtime. 08/27/23   Drusilla Sabas RAMAN, MD  tamsulosin  (FLOMAX ) 0.4 MG CAPS capsule Take 1 capsule (0.4 mg total) by mouth daily. 10/30/24   Nieves Cough, MD    Allergies: Crestor [rosuvastatin] and Paroxetine    Review of Systems See HPI  Updated Vital Signs BP 120/64 (BP Location: Left Arm)   Pulse 87   Temp 98.2 F (36.8 C) (Oral)   Resp 18   Ht 6' 2 (1.88 m)   Wt 69.9 kg   SpO2 99%   BMI 19.79 kg/m   Physical Exam Vitals and nursing note reviewed.  HENT:     Head: Normocephalic and atraumatic.     Mouth/Throat:     Mouth: Mucous membranes are moist.  Eyes:     General:        Right eye: No discharge.        Left eye: No discharge.     Conjunctiva/sclera: Conjunctivae normal.  Cardiovascular:     Rate and Rhythm: Normal rate and  regular rhythm.     Pulses: Normal pulses.     Heart sounds: Normal heart sounds.  Pulmonary:     Effort: Pulmonary effort is normal.     Breath sounds: Normal breath sounds.  Abdominal:     General: Abdomen is flat.     Palpations: Abdomen is soft.     Tenderness: There is abdominal tenderness in the suprapubic area.  Skin:    General: Skin is warm and dry.  Neurological:     General: No focal deficit present.  Psychiatric:        Mood and Affect: Mood normal.     (all labs ordered are listed, but only abnormal results are displayed) Labs Reviewed  URINALYSIS, ROUTINE W REFLEX MICROSCOPIC - Abnormal; Notable for the following components:      Result Value   Hgb urine dipstick MODERATE (*)    Protein, ur 30 (*)    Leukocytes,Ua MODERATE (*)    Bacteria, UA RARE (*)    All other components within normal  limits  URINE CULTURE    EKG: None  Radiology: No results found.   Procedures   Medications Ordered in the ED  cephALEXin (KEFLEX) capsule 500 mg (has no administration in time range)                                    Medical Decision Making Amount and/or Complexity of Data Reviewed Labs: ordered.   83 year old well-appearing male who recently underwent bladder fulguration and urethral dilation on 11/2 at [facility] per chart review. Foley was discontinued before discharge on 11/4.  Exam here notable for suprapubic tenderness.  DDx includes acute cystitis, UTI, bladder outlet obstruction, urethral stricture, pyelonephritis, kidney stone, other intra-abdominal pathology.  Clinically appears well and nontoxic.  Vitals are normal.  He was not able to produce a urine sample here.  His bladder scan showed greater than 580 mL of urine.  At this point placed a Foley catheter and was able to pass it with ease.  He endorsed immediate resolution of his suprapubic pain and approximately 750 mL of amber-colored urine, nonbloody in appearance was collected.  Patient reports that he does have close follow-up with Dr. Nieves of [facility] urology here in [place].  Actively scheduling appointment at this time.  This revealing blood in the urine, moderate leukocytes and rare bacteria.  In an abundance of caution started him on Keflex to treat for TI.  Also sent culture.  Discussed return precautions and advised to follow-up with urology.  Discharged with Foley in place in good condition.     Final diagnoses:  Urinary retention    ED Discharge Orders          Ordered    cephALEXin (KEFLEX) 500 MG capsule  4 times daily        11/02/24 1242               Lang Norleen POUR, PA-C 11/02/24 1243    Garrick Charleston, MD 11/02/24 1312

## 2024-11-02 NOTE — ED Triage Notes (Signed)
 Pt just d/c from ED today, states foley cath is now leaking at penis. A/o. Nad.

## 2024-11-02 NOTE — ED Triage Notes (Signed)
 Pt was discharged from [facility] on Tuesday after having blood clot removed from bladder. Pt had foley catheter removed on Tuesday and was able to urinate on his own. Pt states he has been unable to urinate since 4am and has a lot of pressure in bladder and is unable to get any urine out.

## 2024-11-02 NOTE — Telephone Encounter (Signed)
 Wife called stating patient is unable to void this morning. Wife made aware that office is closed today and that patient would need to go to the ER for evaluation. Wife voiced understanding.

## 2024-11-02 NOTE — ED Notes (Signed)
 Removed foley bag and replaced with leg bag per patient request.

## 2024-11-02 NOTE — ED Provider Notes (Signed)
 [place] EMERGENCY DEPARTMENT AT [facility] Provider Note   CSN: [id number] Arrival date & time: 11/02/24  1521     Patient presents with: Foley cath leaking   Travis Woodward. is a 83 y.o. male.   Patient is an 83 year old male who presents to the emergency department with a chief complaint of leaking around his Foley catheter.  He was evaluated in the emergency department earlier today and Foley catheter was changed.  Patient notes that he stood up and felt as though the Foley pulled out slightly.  He has had some leaking since that time.  He notes that he is due to see urology tomorrow.  No hematuria at this time.        Prior to Admission medications   Medication Sig Start Date End Date Taking? Authorizing Provider  apixaban  (ELIQUIS ) 5 MG TABS tablet Take 1 tablet (5 mg total) by mouth 2 (two) times daily. 10/31/24   Sheikh, Omair Latif, DO  aspirin  EC 81 MG tablet Take 81 mg by mouth daily.    [provider]  cephALEXin (KEFLEX) 500 MG capsule Take 1 capsule (500 mg total) by mouth 4 (four) times daily. 11/02/24   Robinson, John K, PA-C  Cholecalciferol  (VITAMIN D  HIGH POTENCY PO) Take 5,000 Units by mouth daily.    [provider]  Cyanocobalamin (VITAMIN B-12 IJ) Inject 1 mL as directed every 30 (thirty) days.     [provider]  docusate sodium  (COLACE) 100 MG capsule Take 1 capsule (100 mg total) by mouth 2 (two) times daily. Patient taking differently: Take 100 mg by mouth at bedtime. 08/26/23   Drusilla Sabas RAMAN, MD  finasteride  (PROSCAR ) 5 MG tablet Take 1 tablet (5 mg total) by mouth daily. 10/30/24   Nieves Cough, MD  nitroGLYCERIN  (NITROSTAT ) 0.4 MG SL tablet Place 0.4 mg under the tongue every 5 (five) minutes as needed for chest pain. 06/23/17   [provider]  ondansetron  (ZOFRAN ) 4 MG tablet Take 1 tablet (4 mg total) by mouth every 6 (six) hours as needed for nausea. 10/31/24   Sherrill Cable Latif, DO  oxyCODONE   (ROXICODONE ) 15 MG immediate release tablet Take 15 mg by mouth every 8 (eight) hours as needed for pain. 10/03/24   [provider]  polyethylene glycol (MIRALAX  / GLYCOLAX ) 17 g packet Take 17 g by mouth daily. Patient taking differently: Take 17 g by mouth at bedtime. 08/27/23   Drusilla Sabas RAMAN, MD  tamsulosin  (FLOMAX ) 0.4 MG CAPS capsule Take 1 capsule (0.4 mg total) by mouth daily. 10/30/24   Nieves Cough, MD    Allergies: Crestor [rosuvastatin] and Paroxetine    Review of Systems  Genitourinary:        Foley catheter leaking  All other systems reviewed and are negative.   Updated Vital Signs BP (!) 125/59   Pulse 85   Temp 98.5 F (36.9 C) (Oral)   Resp 18   SpO2 94%   Physical Exam Vitals and nursing note reviewed. Exam conducted with a chaperone present.  Constitutional:      Appearance: Normal appearance.  HENT:     Head: Normocephalic and atraumatic.  Cardiovascular:     Rate and Rhythm: Normal rate and regular rhythm.     Pulses: Normal pulses.     Heart sounds: Normal heart sounds. No murmur heard.    No gallop.  Pulmonary:     Effort: Pulmonary effort is normal. No respiratory distress.  Abdominal:  General: Abdomen is flat. Bowel sounds are normal. There is no distension.     Palpations: Abdomen is soft.     Tenderness: There is no abdominal tenderness. There is no guarding.  Genitourinary:    Penis: Normal.      Testes: Normal.  Musculoskeletal:        General: Normal range of motion.     Cervical back: Normal range of motion and neck supple. No rigidity or tenderness.  Skin:    General: Skin is warm and dry.  Neurological:     General: No focal deficit present.     Mental Status: He is alert and oriented to person, place, and time. Mental status is at baseline.     (all labs ordered are listed, but only abnormal results are displayed) Labs Reviewed - No data to display  EKG: None  Radiology: No results found.   Procedures    Medications Ordered in the ED - No data to display                                  Medical Decision Making Patient is doing well at this time.  Foley catheter was changed and he was left with a bigger bag at this time.  Patient already has follow-up with urology tomorrow he was directed to keep this appointment.  He has no hematuria at this point.  Strict turn precautions were provided for any new or worsening symptoms.  Patient voiced understanding to the plan and had no additional questions.  He is already on antibiotics for possible UTI at this time.        Final diagnoses:  None    ED Discharge Orders     None          Travis Lonni BIRCH, PA-C 11/02/24 1717    [name], MD 11/02/24 [phone]

## 2024-11-02 NOTE — Discharge Instructions (Addendum)
 Evaluation today was overall reassuring.  Foley catheter was placed without complication.  Please leave the catheter in until you are evaluated by urology.  Also starting on Keflex as your urine was somewhat concerning for a UTI.  If you develop worsening abdominal pain, fever, nausea vomiting or diarrhea, are not passing urine through the Foley, bloody urine or any other concerning symptom please return to the ED for further evaluation.

## 2024-11-02 NOTE — Discharge Instructions (Signed)
 Please follow-up with urology as scheduled.  Return to emergency department immediately for any new or worsening symptoms.

## 2024-11-03 ENCOUNTER — Telehealth: Payer: Self-pay

## 2024-11-03 NOTE — Telephone Encounter (Signed)
 See other encounter

## 2024-11-03 NOTE — Telephone Encounter (Signed)
 See below

## 2024-11-03 NOTE — Telephone Encounter (Signed)
 Called pt to let him know we have him scheduled for a VT on 11/17 at 9:30 am pt voiced his understanding

## 2024-11-03 NOTE — Telephone Encounter (Signed)
 Pt wife called to get appt due to pt having a catheter pt wife was advised that pt can have a catheter in up to a month and a message with the pt Ed visits will be sent to MD Eskridge for his recommendations.

## 2024-11-04 LAB — URINE CULTURE: Culture: >=100000 — AB

## 2024-11-05 ENCOUNTER — Telehealth (HOSPITAL_BASED_OUTPATIENT_CLINIC_OR_DEPARTMENT_OTHER): Payer: Self-pay | Admitting: *Deleted

## 2024-11-05 NOTE — Anesthesia Postprocedure Evaluation (Signed)
 Anesthesia Post Note  Patient: [place].  Procedure(s) Performed: CYSTOSCOPY, WITH BLADDER FULGURATION,URETHRAL DILITATION (Penis)     Patient location during evaluation: PACU Anesthesia Type: General Level of consciousness: awake and alert Pain management: pain level controlled Vital Signs Assessment: post-procedure vital signs reviewed and stable Respiratory status: spontaneous breathing, nonlabored ventilation, respiratory function stable and patient connected to nasal cannula oxygen Cardiovascular status: blood pressure returned to baseline and stable Postop Assessment: no apparent nausea or vomiting Anesthetic complications: no   No notable events documented.  Last Vitals:  Vitals:   10/30/24 2307 10/31/24 0334  BP: (!) 105/53 (!) 99/58  Pulse: 76 77  Resp: 18 18  Temp: 36.7 C 36.8 C  SpO2: 97% 97%    Last Pain:  Vitals:   10/31/24 1208  TempSrc:   PainSc: 2                  Kaislee Chao L Jameia Makris

## 2024-11-05 NOTE — Progress Notes (Signed)
 ED Antimicrobial Stewardship Positive Culture Follow Up   Travis Woodward. is an 83 y.o. male who presented to [facility] on @ADMITDT @ with a chief complaint of  Chief Complaint  Patient presents with   Urinary Retention    Recent Results (from the past 720 hours)  Urine Culture     Status: None   Collection Time: 10/24/24  3:40 PM   Specimen: Urine, Catheterized  Result Value Ref Range Status   Specimen Description   Final    URINE, CATHETERIZED Performed at [facility], [address] [place], [place] [address]    Special Requests   Final    NONE Performed at [facility], [address] [place], [place] [address]    Culture   Final    NO GROWTH Performed at [facility], [address], [place], [place] [address]    Report Status 10/25/2024 FINAL  Final  Surgical pcr screen     Status: None   Collection Time: 10/29/24  7:27 PM   Specimen: Nasal Mucosa; Nasal Swab  Result Value Ref Range Status   MRSA, PCR NEGATIVE NEGATIVE Final   Staphylococcus aureus NEGATIVE NEGATIVE Final    Comment: (NOTE) The Xpert SA Assay (FDA approved for NASAL specimens in patients 61 years of age and older), is one component of a comprehensive surveillance program. It is not intended to diagnose infection nor to guide or monitor treatment. Performed at [facility], [address], [place], [place] [address]   Urine Culture     Status: Abnormal   Collection Time: 11/02/24 11:45 AM   Specimen: Urine, Clean Catch  Result Value Ref Range Status   Specimen Description   Final    URINE, CLEAN CATCH Performed at [facility], [address] [place], [place] [address]    Special Requests   Final    NONE Performed at [facility], [address], [place], [place] [address]    Culture >=100,000 COLONIES/mL [facility] MORGANII (A)  Final   Report Status 11/04/2024 FINAL  Final   Organism ID, Bacteria MORGANELLA MORGANII (A)  Final      Susceptibility   Morganella  morganii - MIC*    AMPICILLIN >=32 RESISTANT Resistant     ERTAPENEM  <=0.12 SENSITIVE Sensitive     CIPROFLOXACIN  <=0.06 SENSITIVE Sensitive     GENTAMICIN <=1 SENSITIVE Sensitive     NITROFURANTOIN 128 RESISTANT Resistant     TRIMETH /SULFA  <=20 SENSITIVE Sensitive     AMPICILLIN/SULBACTAM >=32 RESISTANT Resistant     PIP/TAZO Value in next row Sensitive      <=4 SENSITIVEThis is a modified FDA-approved test that has been validated and its performance characteristics determined by the reporting laboratory.  This laboratory is certified under the Clinical Laboratory Improvement Amendments CLIA as qualified to perform high complexity clinical laboratory testing.    MEROPENEM Value in next row Sensitive      <=4 SENSITIVEThis is a modified FDA-approved test that has been validated and its performance characteristics determined by the reporting laboratory.  This laboratory is certified under the Clinical Laboratory Improvement Amendments CLIA as qualified to perform high complexity clinical laboratory testing.    * >=100,000 COLONIES/mL MORGANELLA MORGANII    [x]  Treated with Keflex, organism resistant to prescribed antimicrobial  STOP Keflex START: Bactrim  1 DS tablet twice daily for 10 days (Qty 20; Refills 0) Notify Urology office  ED Provider: Lauraine Pickett PA   Dorn Buttner, PharmD, BCPS 11/05/2024 10:57 AM ED  Clinical Pharmacist -  [phone]

## 2024-11-05 NOTE — Telephone Encounter (Signed)
 Post ED Visit - Positive Culture Follow-up: Successful Patient Follow-Up  Culture assessed and recommendations reviewed by:  []  Rankin Dee, Pharm.D. []  Venetia Gully, Pharm.D., BCPS AQ-ID []  Garrel Crews, Pharm.D., BCPS []  Almarie Lunger, Pharm.D., BCPS []  [place], [place].D., BCPS, AAHIVP []  Rosaline Bihari, Pharm.D., BCPS, AAHIVP []  Vernell Meier, PharmD, BCPS []  Latanya Hint, PharmD, BCPS []  Donald Medley, PharmD, BCPS []  Rocky Bold, PharmD  Positive urine culture  []  Patient discharged without antimicrobial prescription and treatment is now indicated [x]  Organism is resistant to prescribed ED discharge antimicrobial []  Patient with positive blood cultures  Changes discussed with ED provider: Sarah Smoot [facility] New antibiotic prescription Bactrim  DS 1 tab twice daily for 10d ays  Called to [facility] [phone]  Contacted patient, date 11/06/23 , time 1304   Jama Wyman Kipper 11/05/2024, 1:03 PM

## 2024-11-08 ENCOUNTER — Inpatient Hospital Stay (HOSPITAL_COMMUNITY)
Admission: EM | Admit: 2024-11-08 | Discharge: 2024-11-17 | DRG: 717 | Disposition: A | Discharge status: Home / Self Care | Attending: Internal Medicine | Admitting: Internal Medicine

## 2024-11-08 ENCOUNTER — Emergency Department (HOSPITAL_COMMUNITY)

## 2024-11-08 ENCOUNTER — Other Ambulatory Visit: Payer: Self-pay

## 2024-11-08 ENCOUNTER — Encounter (HOSPITAL_COMMUNITY): Payer: Self-pay | Admitting: Emergency Medicine

## 2024-11-08 DIAGNOSIS — K5900 Constipation, unspecified: Secondary | ICD-10-CM | POA: Diagnosis present

## 2024-11-08 DIAGNOSIS — N1831 Chronic kidney disease, stage 3a: Secondary | ICD-10-CM | POA: Diagnosis present

## 2024-11-08 DIAGNOSIS — I10 Essential (primary) hypertension: Secondary | ICD-10-CM | POA: Diagnosis not present

## 2024-11-08 DIAGNOSIS — L89326 Pressure-induced deep tissue damage of left buttock: Secondary | ICD-10-CM | POA: Diagnosis present

## 2024-11-08 DIAGNOSIS — Z888 Allergy status to other drugs, medicaments and biological substances status: Secondary | ICD-10-CM

## 2024-11-08 DIAGNOSIS — G8929 Other chronic pain: Secondary | ICD-10-CM | POA: Diagnosis present

## 2024-11-08 DIAGNOSIS — D62 Acute posthemorrhagic anemia: Secondary | ICD-10-CM | POA: Diagnosis present

## 2024-11-08 DIAGNOSIS — E785 Hyperlipidemia, unspecified: Secondary | ICD-10-CM | POA: Diagnosis present

## 2024-11-08 DIAGNOSIS — Z9049 Acquired absence of other specified parts of digestive tract: Secondary | ICD-10-CM

## 2024-11-08 DIAGNOSIS — R079 Chest pain, unspecified: Secondary | ICD-10-CM | POA: Diagnosis present

## 2024-11-08 DIAGNOSIS — Y846 Urinary catheterization as the cause of abnormal reaction of the patient, or of later complication, without mention of misadventure at the time of the procedure: Secondary | ICD-10-CM | POA: Diagnosis present

## 2024-11-08 DIAGNOSIS — Z79899 Other long term (current) drug therapy: Secondary | ICD-10-CM

## 2024-11-08 DIAGNOSIS — I2510 Atherosclerotic heart disease of native coronary artery without angina pectoris: Secondary | ICD-10-CM | POA: Diagnosis present

## 2024-11-08 DIAGNOSIS — B9689 Other specified bacterial agents as the cause of diseases classified elsewhere: Secondary | ICD-10-CM | POA: Diagnosis present

## 2024-11-08 DIAGNOSIS — Z860100 Personal history of colon polyps, unspecified: Secondary | ICD-10-CM

## 2024-11-08 DIAGNOSIS — D649 Anemia, unspecified: Secondary | ICD-10-CM | POA: Diagnosis not present

## 2024-11-08 DIAGNOSIS — Z981 Arthrodesis status: Secondary | ICD-10-CM

## 2024-11-08 DIAGNOSIS — R319 Hematuria, unspecified: Secondary | ICD-10-CM | POA: Diagnosis present

## 2024-11-08 DIAGNOSIS — Z87891 Personal history of nicotine dependence: Secondary | ICD-10-CM | POA: Diagnosis not present

## 2024-11-08 DIAGNOSIS — Z8249 Family history of ischemic heart disease and other diseases of the circulatory system: Secondary | ICD-10-CM

## 2024-11-08 DIAGNOSIS — Z96642 Presence of left artificial hip joint: Secondary | ICD-10-CM | POA: Diagnosis present

## 2024-11-08 DIAGNOSIS — D6832 Hemorrhagic disorder due to extrinsic circulating anticoagulants: Secondary | ICD-10-CM | POA: Diagnosis present

## 2024-11-08 DIAGNOSIS — D696 Thrombocytopenia, unspecified: Secondary | ICD-10-CM | POA: Diagnosis present

## 2024-11-08 DIAGNOSIS — Z8616 Personal history of COVID-19: Secondary | ICD-10-CM

## 2024-11-08 DIAGNOSIS — Z7982 Long term (current) use of aspirin: Secondary | ICD-10-CM | POA: Diagnosis not present

## 2024-11-08 DIAGNOSIS — I129 Hypertensive chronic kidney disease with stage 1 through stage 4 chronic kidney disease, or unspecified chronic kidney disease: Secondary | ICD-10-CM | POA: Diagnosis present

## 2024-11-08 DIAGNOSIS — R339 Retention of urine, unspecified: Secondary | ICD-10-CM | POA: Diagnosis present

## 2024-11-08 DIAGNOSIS — R310 Gross hematuria: Secondary | ICD-10-CM | POA: Diagnosis present

## 2024-11-08 DIAGNOSIS — E782 Mixed hyperlipidemia: Secondary | ICD-10-CM | POA: Diagnosis not present

## 2024-11-08 DIAGNOSIS — Z86718 Personal history of other venous thrombosis and embolism: Secondary | ICD-10-CM | POA: Diagnosis not present

## 2024-11-08 DIAGNOSIS — T45515A Adverse effect of anticoagulants, initial encounter: Secondary | ICD-10-CM | POA: Diagnosis present

## 2024-11-08 DIAGNOSIS — Z743 Need for continuous supervision: Secondary | ICD-10-CM | POA: Diagnosis not present

## 2024-11-08 DIAGNOSIS — Z9861 Coronary angioplasty status: Secondary | ICD-10-CM | POA: Diagnosis not present

## 2024-11-08 DIAGNOSIS — Z7901 Long term (current) use of anticoagulants: Secondary | ICD-10-CM | POA: Diagnosis not present

## 2024-11-08 DIAGNOSIS — N401 Enlarged prostate with lower urinary tract symptoms: Secondary | ICD-10-CM | POA: Diagnosis present

## 2024-11-08 DIAGNOSIS — N3289 Other specified disorders of bladder: Secondary | ICD-10-CM | POA: Diagnosis present

## 2024-11-08 DIAGNOSIS — N390 Urinary tract infection, site not specified: Secondary | ICD-10-CM | POA: Diagnosis present

## 2024-11-08 DIAGNOSIS — Z955 Presence of coronary angioplasty implant and graft: Secondary | ICD-10-CM

## 2024-11-08 DIAGNOSIS — T83511A Infection and inflammatory reaction due to indwelling urethral catheter, initial encounter: Secondary | ICD-10-CM | POA: Diagnosis present

## 2024-11-08 DIAGNOSIS — T83028A Displacement of other indwelling urethral catheter, initial encounter: Secondary | ICD-10-CM | POA: Diagnosis not present

## 2024-11-08 DIAGNOSIS — T39015A Adverse effect of aspirin, initial encounter: Secondary | ICD-10-CM | POA: Diagnosis present

## 2024-11-08 LAB — CBC WITH DIFFERENTIAL/PLATELET
Basophils Absolute: 0 K/uL (ref 0.0–0.1)
Basophils Relative: 0 %
Eosinophils Absolute: 0.3 K/uL (ref 0.0–0.5)
Eosinophils Relative: 3 %
HCT: 32.6 % — ABNORMAL LOW (ref 39.0–52.0)
Hemoglobin: 10.4 g/dL — ABNORMAL LOW (ref 13.0–17.0)
Lymphocytes Relative: 24 %
Lymphs Abs: 2.3 K/uL (ref 0.7–4.0)
MCH: 30.7 pg (ref 26.0–34.0)
MCHC: 31.9 g/dL (ref 30.0–36.0)
MCV: 96.2 fL (ref 80.0–100.0)
Monocytes Absolute: 0.5 K/uL (ref 0.1–1.0)
Monocytes Relative: 5 %
Neutro Abs: 6.5 K/uL (ref 1.7–7.7)
Neutrophils Relative %: 68 %
Platelets: 148 K/uL — ABNORMAL LOW (ref 150–400)
RBC: 3.39 MIL/uL — ABNORMAL LOW (ref 4.22–5.81)
RDW: 16.3 % — ABNORMAL HIGH (ref 11.5–15.5)
WBC: 9.5 K/uL (ref 4.0–10.5)
nRBC: 0 % (ref 0.0–0.2)

## 2024-11-08 LAB — COMPREHENSIVE METABOLIC PANEL WITH GFR
ALT: 6 U/L (ref 0–44)
AST: 13 U/L — ABNORMAL LOW (ref 15–41)
Albumin: 4.1 g/dL (ref 3.5–5.0)
Alkaline Phosphatase: 114 U/L (ref 38–126)
Anion gap: 12 (ref 5–15)
BUN: 17 mg/dL (ref 8–23)
CO2: 25 mmol/L (ref 22–32)
Calcium: 9.5 mg/dL (ref 8.9–10.3)
Chloride: 102 mmol/L (ref 98–111)
Creatinine, Ser: 1.56 mg/dL — ABNORMAL HIGH (ref 0.61–1.24)
GFR, Estimated: 44 mL/min — ABNORMAL LOW (ref >60)
Glucose, Bld: 110 mg/dL — ABNORMAL HIGH (ref 70–99)
Potassium: 4.3 mmol/L (ref 3.5–5.1)
Sodium: 139 mmol/L (ref 135–145)
Total Bilirubin: 0.3 mg/dL (ref 0.0–1.2)
Total Protein: 6.9 g/dL (ref 6.5–8.1)

## 2024-11-08 LAB — PROTIME-INR
INR: 1.1 (ref 0.8–1.2)
Prothrombin Time: 15.1 s (ref 11.4–15.2)

## 2024-11-08 LAB — URINALYSIS, ROUTINE W REFLEX MICROSCOPIC
Bilirubin Urine: NEGATIVE
Glucose, UA: NEGATIVE mg/dL
Ketones, ur: NEGATIVE mg/dL
Nitrite: NEGATIVE
Protein, ur: 100 mg/dL — AB
RBC / HPF: >50 RBC/hpf (ref 0–5)
Specific Gravity, Urine: 1.004 — ABNORMAL LOW (ref 1.005–1.030)
pH: 6 (ref 5.0–8.0)

## 2024-11-08 LAB — HEMOGLOBIN AND HEMATOCRIT, BLOOD
HCT: 31.3 % — ABNORMAL LOW (ref 39.0–52.0)
HCT: 29.2 % — ABNORMAL LOW (ref 39.0–52.0)
Hemoglobin: 10.1 g/dL — ABNORMAL LOW (ref 13.0–17.0)
Hemoglobin: 9.4 g/dL — ABNORMAL LOW (ref 13.0–17.0)

## 2024-11-08 MED ORDER — ZINC OXIDE 40 % EX OINT
TOPICAL_OINTMENT | Freq: Two times a day (BID) | CUTANEOUS | Status: DC
  Administered 2024-11-10 – 2024-11-16 (×3): 1 via TOPICAL
  Filled 2024-11-08: qty 57

## 2024-11-08 MED ORDER — PROCHLORPERAZINE EDISYLATE 10 MG/2ML IJ SOLN: 5.0000 mg | Freq: Four times a day (QID) | INTRAMUSCULAR | Status: DC | PRN

## 2024-11-08 MED ORDER — CHLORHEXIDINE GLUCONATE CLOTH 2 % EX PADS
6.0000 | MEDICATED_PAD | Freq: Every day | CUTANEOUS | Status: DC
  Administered 2024-11-08 – 2024-11-17 (×9): 6 via TOPICAL

## 2024-11-08 MED ORDER — OXYCODONE HCL 5 MG PO TABS
15.0000 mg | ORAL_TABLET | Freq: Three times a day (TID) | ORAL | Status: DC | PRN
Start: 2024-11-08 — End: 2024-11-17
  Administered 2024-11-08 – 2024-11-17 (×24): 15 mg via ORAL
  Filled 2024-11-08 (×25): qty 3

## 2024-11-08 MED ORDER — SODIUM CHLORIDE 0.9 % IV SOLN: INTRAVENOUS | Status: AC

## 2024-11-08 MED ORDER — POLYETHYLENE GLYCOL 3350 17 G PO PACK: 17.0000 g | PACK | Freq: Every day | ORAL | Status: DC | PRN

## 2024-11-08 MED ORDER — TAMSULOSIN HCL 0.4 MG PO CAPS
0.4000 mg | ORAL_CAPSULE | Freq: Every day | ORAL | Status: DC
  Administered 2024-11-08 – 2024-11-17 (×10): 0.4 mg via ORAL
  Filled 2024-11-08 (×10): qty 1

## 2024-11-08 MED ORDER — HYDRALAZINE HCL 20 MG/ML IJ SOLN: 10.0000 mg | Freq: Four times a day (QID) | INTRAMUSCULAR | Status: DC | PRN

## 2024-11-08 MED ORDER — MELATONIN 3 MG PO TABS
6.0000 mg | ORAL_TABLET | Freq: Every evening | ORAL | Status: DC | PRN
  Filled 2024-11-08: qty 2

## 2024-11-08 MED ORDER — SODIUM CHLORIDE 0.9 % IR SOLN
3000.0000 mL | Status: DC
  Administered 2024-11-08 – 2024-11-14 (×5): 3000 mL

## 2024-11-08 MED ORDER — OXYCODONE HCL 5 MG PO TABS
10.0000 mg | ORAL_TABLET | Freq: Once | ORAL | Status: AC
  Administered 2024-11-08: 10 mg via ORAL
  Filled 2024-11-08: qty 2

## 2024-11-08 MED ORDER — ALBUTEROL SULFATE (2.5 MG/3ML) 0.083% IN NEBU: 2.5000 mg | INHALATION_SOLUTION | RESPIRATORY_TRACT | Status: DC | PRN

## 2024-11-08 MED ORDER — MELATONIN 5 MG PO TABS: 5.0000 mg | ORAL_TABLET | Freq: Every evening | ORAL | Status: DC | PRN

## 2024-11-08 MED ORDER — OXYCODONE HCL 5 MG PO TABS
5.0000 mg | ORAL_TABLET | ORAL | Status: DC | PRN
  Administered 2024-11-08: 5 mg via ORAL
  Filled 2024-11-08: qty 1

## 2024-11-08 MED ORDER — ACETAMINOPHEN 500 MG PO TABS
500.0000 mg | ORAL_TABLET | Freq: Four times a day (QID) | ORAL | Status: DC | PRN
  Filled 2024-11-08: qty 1

## 2024-11-08 NOTE — Consult Note (Addendum)
 I have been asked to see the patient by Dr. Deliliah Room for evaluation and management of gross hematuria.  History of present illness: 83 year old man with a history of DVT on Eliquis , aspirin  and Plavix  and recent hospitalization for gross hematuria with clot retention presented to [facility] with acute onset gross hematuria.  He underwent cystoscopy with clot evacuation and fulguration with Dr. Georganne on 11/2.  He is a patient of Dr. [facility] and has a history of 120 g prostate and prior history of acute urinary retention.  He was transferred from [facility] to [facility] for further management today.  He currently has a 22 French three-way Foley catheter with inflow port capped draining to gravity drainage.  Review of systems: A 12 point comprehensive review of systems was obtained and is negative unless otherwise stated in the history of present illness.  Patient Active Problem List   Diagnosis Date Noted   Hematuria of unknown cause 11/08/2024   Hematuria 10/28/2024   Extensive Bil DVT (deep venous thrombosis) 10/13/2023   COVID-19 07/30/2023   Palpitations 03/06/2020   Osteoporosis 10/04/2019   Hypercortisolemia 10/04/2019   Closed transcervical fracture of left femur (HCC) 06/28/2019   Hyperlipidemia 06/28/2019   Anemia 06/28/2019   Hypertension    Calculus of gallbladder without cholecystitis without obstruction    Thrombocytopenia 07/16/2015   History of appendicitis 07/16/2015   H/O ischemic heart disease 07/16/2015   H/O normocytic normochromic anemia 07/16/2015   Anxiety about health 07/16/2015   B12 deficiency 07/16/2015   CAD S/P percutaneous coronary angioplasty--09/03/2004 01/02/2015   Subcutaneous emphysema 06/25/2014   Subarachnoid hemorrhage (HCC) 06/25/2014   Pulmonary contusion 06/25/2014   IVH (intraventricular hemorrhage) (HCC) 06/25/2014   Trauma 06/25/2014   Intracranial hemorrhage (HCC) 06/25/2014   Brain injury (HCC)  06/25/2014   Coronary artery disease without angina pectoris 06/25/2014   Hypoxia 06/24/2014   GERD 06/23/2010   WEIGHT LOSS, ABNORMAL 06/23/2010   NAUSEA, CHRONIC 06/23/2010   VERTEBRAL FRACTURE, THORACIC SPINE 07/16/2008   H N P-LUMBAR 06/25/2008   LOW BACK PAIN 06/25/2008   SCIATICA 06/25/2008   LUMBAR SPASM 06/25/2008    No current facility-administered medications on file prior to encounter.   Current Outpatient Medications on File Prior to Encounter  Medication Sig Dispense Refill   apixaban  (ELIQUIS ) 5 MG TABS tablet Take 1 tablet (5 mg total) by mouth 2 (two) times daily.     aspirin  EC 81 MG tablet Take 81 mg by mouth daily.     Cholecalciferol  (VITAMIN D  HIGH POTENCY PO) Take 5,000 Units by mouth daily.     docusate sodium  (COLACE) 100 MG capsule Take 1 capsule (100 mg total) by mouth 2 (two) times daily. (Patient taking differently: Take 100 mg by mouth at bedtime.) 30 capsule 0   finasteride  (PROSCAR ) 5 MG tablet Take 1 tablet (5 mg total) by mouth daily. 90 tablet 0   nitroGLYCERIN  (NITROSTAT ) 0.4 MG SL tablet Place 0.4 mg under the tongue every 5 (five) minutes as needed for chest pain.     ondansetron  (ZOFRAN ) 4 MG tablet Take 1 tablet (4 mg total) by mouth every 6 (six) hours as needed for nausea. 20 tablet 0   oxyCODONE  (ROXICODONE ) 15 MG immediate release tablet Take 15 mg by mouth every 8 (eight) hours as needed for pain.     polyethylene glycol (MIRALAX  / GLYCOLAX ) 17 g packet Take 17 g by mouth daily. (Patient taking differently: Take 17 g  by mouth at bedtime.) 14 each 0   sulfamethoxazole -trimethoprim  (BACTRIM  DS) 800-160 MG tablet Take 1 tablet by mouth 2 (two) times daily.     tamsulosin  (FLOMAX ) 0.4 MG CAPS capsule Take 1 capsule (0.4 mg total) by mouth daily. 30 capsule 11   cephALEXin (KEFLEX) 500 MG capsule Take 1 capsule (500 mg total) by mouth 4 (four) times daily. (Patient not taking: Reported on 11/08/2024) 20 capsule 0   Cyanocobalamin (VITAMIN B-12 IJ)  Inject 1 mL as directed every 30 (thirty) days.       Past Medical History:  Diagnosis Date   Chronic kidney disease    stage 3   Colon polyps    Complication of anesthesia    difficulty urinating    Coronary artery disease    Enlarged prostate    Hyperlipidemia    Hypertension     Past Surgical History:  Procedure Laterality Date   acd fusion & plating     BACK SURGERY     x 6   CARDIAC CATHETERIZATION  2005   CHOLECYSTECTOMY N/A 07/19/2017   Procedure: LAPAROSCOPIC CHOLECYSTECTOMY;  Surgeon: Mavis Anes, MD;  Location: [facility];  Service: General;  Laterality: N/A;   CORONARY STENT PLACEMENT  2005   CYSTOSCOPY WITH FULGERATION N/A 10/29/2024   Procedure: CYSTOSCOPY, WITH BLADDER FULGURATION,URETHRAL DILITATION;  Surgeon: Georganne Penne SAUNDERS, MD;  Location: [facility];  Service: Urology;  Laterality: N/A;   HEMORRHOID SURGERY     HIP ARTHROPLASTY Left 06/29/2019   Procedure: ARTHROPLASTY BIPOLAR HIP (HEMIARTHROPLASTY);  Surgeon: Josefina Chew, MD;  Location: [facility];  Service: Orthopedics;  Laterality: Left;   LAPAROSCOPIC APPENDECTOMY  04/17/2012   Procedure: APPENDECTOMY LAPAROSCOPIC;  Surgeon: Anes DELENA Mavis, MD;  Location: [facility];  Service: General;  Laterality: N/A;   PERIPHERAL VASCULAR INTERVENTION  10/14/2023   Procedure: PERIPHERAL VASCULAR INTERVENTION;  Surgeon: Pearline Norman RAMAN, MD;  Location: [facility] INVASIVE CV LAB;  Service: Cardiovascular;;   PERIPHERAL VASCULAR THROMBECTOMY Right 10/14/2023   Procedure: PERIPHERAL VASCULAR THROMBECTOMY;  Surgeon: Pearline Norman RAMAN, MD;  Location: [facility] INVASIVE CV LAB;  Service: Cardiovascular;  Laterality: Right;   PERIPHERAL VASCULAR ULTRASOUND/IVUS Right 10/14/2023   Procedure: Peripheral Vascular Ultrasound/IVUS;  Surgeon: Pearline Norman RAMAN, MD;  Location: [facility] INVASIVE CV LAB;  Service: Cardiovascular;  Laterality: Right;   TONSILLECTOMY      Social History   Tobacco Use   Smoking status: Former    Current packs/day: 0.00    Types:  Cigarettes    Quit date: 07/16/1972    Years since quitting: 52.3    Passive exposure: Past   Smokeless tobacco: Never  Vaping Use   Vaping status: Never Used  Substance Use Topics   Alcohol use: No   Drug use: No    Family History  Problem Relation Age of Onset   Heart attack Mother    Tuberculosis Maternal Grandfather    Adrenal disorder Neg Hx    Colon cancer Neg Hx    Stomach cancer Neg Hx    Pancreatic cancer Neg Hx    Esophageal cancer Neg Hx     PE: Vitals:   11/08/24 0815 11/08/24 0930 11/08/24 0942 11/08/24 1046  BP: 120/61 116/64  (!) 150/69  Pulse: 79 69  83  Resp: 18 17  17   Temp:   98 F (36.7 C) 97.7 F (36.5 C)  TempSrc:   Oral Oral  SpO2: 96% 98%  99%  Weight:      Height:  Patient appears to be in no acute distress  patient is alert and oriented x3 Atraumatic normocephalic head No increased work of breathing, no audible wheezes/rhonchi Regular sinus rhythm/rate Abdomen is soft, nontender, nondistended, no CVA or suprapubic tenderness GU 22Fr 3 way foley with inflow port capped, dark red urine without clot in tubing Lower extremities are symmetric without appreciable edema Grossly neurologically intact No identifiable skin lesions  Recent Labs    11/08/24 0228 11/08/24 0454 11/08/24 0856  WBC 9.5  --   --   HGB 10.4* 10.1* 9.4*  HCT 32.6* 31.3* 29.2*   Recent Labs    11/08/24 0228  NA 139  K 4.3  CL 102  CO2 25  GLUCOSE 110*  BUN 17  CREATININE 1.56*  CALCIUM 9.5   Recent Labs    11/08/24 0228  INR 1.1   No results for input(s): LABURIN in the last 72 hours. Results for orders placed or performed during the hospital encounter of 11/02/24  Urine Culture     Status: Abnormal   Collection Time: 11/02/24 11:45 AM   Specimen: Urine, Clean Catch  Result Value Ref Range Status   Specimen Description   Final    URINE, CLEAN CATCH Performed at [facility], [address], [place], [place] [address]    Special Requests    Final    NONE Performed at [facility], [address], [place], [place] [address]    Culture >=100,000 COLONIES/mL [facility] MORGANII (A)  Final   Report Status 11/04/2024 FINAL  Final   Organism ID, Bacteria MORGANELLA MORGANII (A)  Final      Susceptibility   Morganella morganii - MIC*    AMPICILLIN >=32 RESISTANT Resistant     ERTAPENEM  <=0.12 SENSITIVE Sensitive     CIPROFLOXACIN  <=0.06 SENSITIVE Sensitive     GENTAMICIN <=1 SENSITIVE Sensitive     NITROFURANTOIN 128 RESISTANT Resistant     TRIMETH /SULFA  <=20 SENSITIVE Sensitive     AMPICILLIN/SULBACTAM >=32 RESISTANT Resistant     PIP/TAZO Value in next row Sensitive      <=4 SENSITIVEThis is a modified FDA-approved test that has been validated and its performance characteristics determined by the reporting laboratory.  This laboratory is certified under the Clinical Laboratory Improvement Amendments CLIA as qualified to perform high complexity clinical laboratory testing.    MEROPENEM Value in next row Sensitive      <=4 SENSITIVEThis is a modified FDA-approved test that has been validated and its performance characteristics determined by the reporting laboratory.  This laboratory is certified under the Clinical Laboratory Improvement Amendments CLIA as qualified to perform high complexity clinical laboratory testing.    * >=100,000 COLONIES/mL MORGANELLA MORGANII    Imaging: CT Abd/Pelvis 11/08/24 IMPRESSION: 1. Thick-walled bladder, suggesting cystitis. 2. Indwelling Foley catheter and associated bladder hemorrhage, improved. 3. Prostatomegaly, suggesting BPH.   Electronically signed by: Pinkie Pebbles MD 11/08/2024 02:55 AM EST RP Workstation: [id number]  Assessment/Plan: Gross hematuria BPH with LUTs -22Fr 3-way foley catheter irrigated 250 cc sterile water  without any return of clot. Urine cleared easily. Continuous bladder irrigation initiated -Likely secondary to anticoagulation in setting of BPH and indwelling  foley. Recommend holding anticoagulation while acutely bleeding. -NPO at midnight in the event intervention is needed tomorrow -Will alert Dr. Nieves of patient's admission    [employer] D Kenneisha Cochrane

## 2024-11-08 NOTE — ED Notes (Signed)
 Attempted to call report to [facility]. Nurse will give a call back.

## 2024-11-08 NOTE — H&P (Signed)
 History and Physical    Patient: Travis Woodward. FMW:[id number] DOB: 06/10/1941 DOA: 11/08/2024 DOS: the patient was seen and examined on 11/08/2024 PCP: Orpha Yancey LABOR, MD  Patient coming from: Home  Chief Complaint:  Chief Complaint  Patient presents with   Hematuria   HPI: Tyrone Pautsch. is a 83 y.o. male with medical history significant of multiple comorbidities including essential hypertension, CKD stage IIIa, BPH, who was seen in the emergency room because of hematuria.  Of note, patient was recently admitted at [facility] facility for the management of hematuria.  Patient was on Eliquis  which was held.  Patient underwent cystoscopy with clot evacuation and fulguration with Dr. Guerron on 10/29/2024.  [facility], Dr. Nieves and has a history of urinary retention as well as benign prostatic hyperplasia.  At [facility] emergency room, urologist was consulted who recommending placing three-way Foley catheter and transferring the patient to [facility] for further management of hematuria.  I discussed the case with [name], nurse practitioner, urology.  Patient will be transferred to [facility] for further management of hematuria, needing urology consultation and close hemoglobin and hematocrit monitoring.  Review of Systems: As mentioned in the history of present illness. All other systems reviewed and are negative. Past Medical History:  Diagnosis Date   Chronic kidney disease    stage 3   Colon polyps    Complication of anesthesia    difficulty urinating    Coronary artery disease    Enlarged prostate    Hyperlipidemia    Hypertension    Past Surgical History:  Procedure Laterality Date   acd fusion & plating     BACK SURGERY     x 6   CARDIAC CATHETERIZATION  2005   CHOLECYSTECTOMY N/A 07/19/2017   Procedure: LAPAROSCOPIC CHOLECYSTECTOMY;  Surgeon: Mavis Anes, MD;  Location: [facility];  Service: General;   Laterality: N/A;   CORONARY STENT PLACEMENT  2005   CYSTOSCOPY WITH FULGERATION N/A 10/29/2024   Procedure: CYSTOSCOPY, WITH BLADDER FULGURATION,URETHRAL DILITATION;  Surgeon: Georganne Penne SAUNDERS, MD;  [facility];  Service: Urology;  Laterality: N/A;   HEMORRHOID SURGERY     HIP ARTHROPLASTY Left 06/29/2019   Procedure: ARTHROPLASTY BIPOLAR HIP (HEMIARTHROPLASTY);  Surgeon: Josefina Chew, MD;  Location: [facility];  Service: Orthopedics;  Laterality: Left;   LAPAROSCOPIC APPENDECTOMY  04/17/2012   Procedure: APPENDECTOMY LAPAROSCOPIC;  Surgeon: Anes LABOR Mavis, MD;  Location: [facility];  Service: General;  Laterality: N/A;   PERIPHERAL VASCULAR INTERVENTION  10/14/2023   Procedure: PERIPHERAL VASCULAR INTERVENTION;  Surgeon: Pearline Norman RAMAN, MD;  Location[facility] INVASIVE CV LAB;  Service: Cardiovascular;;   PERIPHERAL VASCULAR THROMBECTOMY Right 10/14/2023   Procedure: PERIPHERAL VASCULAR THROMBECTOMY;  Surgeon: Pearline Norman RAMAN, MD;  Location: [facility] INVASIVE CV LAB;  Service: Cardiovascular;  Laterality: Right;   PERIPHERAL VASCULAR ULTRASOUND/IVUS Right 10/14/2023   Procedure: Peripheral Vascular Ultrasound/IVUS;  Surgeon: Pearline Norman RAMAN, MD;  Location: [facility] INVASIVE CV LAB;  Service: Cardiovascular;  Laterality: Right;   TONSILLECTOMY     Social History:  reports that he quit smoking about 52 years ago. His smoking use included cigarettes. He has been exposed to tobacco smoke. He has never used smokeless tobacco. He reports that he does not drink alcohol and does not use drugs.  Allergies  Allergen Reactions   Crestor [Rosuvastatin] Other (See Comments)    Unknown    Paroxetine Rash    Family History  Problem Relation Age of Onset   Heart attack Mother    Tuberculosis Maternal Grandfather    Adrenal disorder Neg Hx    Colon cancer Neg Hx    Stomach cancer Neg Hx    Pancreatic cancer Neg Hx    Esophageal cancer Neg Hx     Prior to Admission medications   Medication Sig Start Date End Date  Taking? Authorizing Provider  apixaban  (ELIQUIS ) 5 MG TABS tablet Take 1 tablet (5 mg total) by mouth 2 (two) times daily. 10/31/24  Yes Sheikh, Omair Latif, DO  aspirin  EC 81 MG tablet Take 81 mg by mouth daily.   Yes [provider]  Cholecalciferol  (VITAMIN D  HIGH POTENCY PO) Take 5,000 Units by mouth daily.   Yes [provider]  Cyanocobalamin (VITAMIN B-12 IJ) Inject 1 mL as directed every 30 (thirty) days.    Yes [provider]  docusate sodium  (COLACE) 100 MG capsule Take 1 capsule (100 mg total) by mouth 2 (two) times daily. Patient taking differently: Take 100 mg by mouth at bedtime. 08/26/23  Yes Drusilla Sabas RAMAN, MD  finasteride  (PROSCAR ) 5 MG tablet Take 1 tablet (5 mg total) by mouth daily. 10/30/24  Yes Nieves Cough, MD  nitroGLYCERIN  (NITROSTAT ) 0.4 MG SL tablet Place 0.4 mg under the tongue every 5 (five) minutes as needed for chest pain. 06/23/17  Yes [provider]  ondansetron  (ZOFRAN ) 4 MG tablet Take 1 tablet (4 mg total) by mouth every 6 (six) hours as needed for nausea. 10/31/24  Yes Sheikh, Omair Latif, DO  oxyCODONE  (ROXICODONE ) 15 MG immediate release tablet Take 15 mg by mouth every 8 (eight) hours as needed for pain. 10/03/24  Yes [provider]  polyethylene glycol (MIRALAX  / GLYCOLAX ) 17 g packet Take 17 g by mouth daily. Patient taking differently: Take 17 g by mouth at bedtime. 08/27/23  Yes Drusilla Sabas RAMAN, MD  sulfamethoxazole -trimethoprim  (BACTRIM  DS) 800-160 MG tablet Take 1 tablet by mouth 2 (two) times daily. 11/05/24  Yes [provider]  tamsulosin  (FLOMAX ) 0.4 MG CAPS capsule Take 1 capsule (0.4 mg total) by mouth daily. 10/30/24  Yes Nieves Cough, MD  cephALEXin (KEFLEX) 500 MG capsule Take 1 capsule (500 mg total) by mouth 4 (four) times daily. Patient not taking: Reported on 11/08/2024 11/02/24   Lang Norleen POUR, PA-C    Physical Exam: Vitals:   11/08/24 0730 11/08/24 0745 11/08/24 0800 11/08/24  0815  BP: (!) 115/59 (!) 103/57 (!) 106/55 120/61  Pulse: 67 66 67 79  Resp:    18  Temp:      TempSrc:      SpO2: 98% 97% 96% 96%  Weight:      Height:       Constitutional: NAD, calm, comfortable Eyes: PERRL, lids and conjunctivae normal ENMT: Mucous membranes are moist. Posterior pharynx clear of any exudate or lesions.Normal dentition.  Neck: normal, supple, no masses, no thyromegaly Respiratory: clear to auscultation bilaterally, no wheezing, no crackles. Normal respiratory effort. No accessory muscle use.  Cardiovascular: Regular rate and rhythm, no murmurs / rubs / gallops. No extremity edema. 2+ pedal pulses. No carotid bruits.  Abdomen: no tenderness, no masses palpated. No hepatosplenomegaly. Bowel sounds positive.  Musculoskeletal: no clubbing / cyanosis. No joint deformity upper and lower extremities. Good ROM, no contractures. Normal muscle tone.  Skin: no rashes, lesions, ulcers. No induration Neurologic: CN 2-12 grossly intact. Sensation intact, DTR normal. Strength 5/5 x all 4 extremities.  Genitourinary: Foley  catheter in place, draining blood   Data Reviewed:  There are no new results to review at this time.  Assessment and Plan:  Recurrent hematuria, POA: Patient has a three-way Foley catheter in place.  He will be transferred to [facility] for further management.  ED.  Any pain emergency room discussed the case with urologist, Dr. Cam who recommended placement of a large three-way catheter and admission requested on hospital. Hold Eliquis  Follow-up H&H closely Avoid any NSAIDs or anticoagulation Further management as per urology. Will be kept n.p.o. in anticipation for any possible urological procedure.  Hypertension, POA: intravenous hydralazine  as needed for systolic blood pressure greater than 160  BPH, POA: Continue with Flomax   Dispo: Patient will be transferred to [facility].    Advance Care Planning:   Code Status: Full Code  full  Consults: Urology  Family Communication: None at the bedside  Severity of Illness: The appropriate patient status for this patient is INPATIENT. Inpatient status is judged to be reasonable and necessary in order to provide the required intensity of service to ensure the patient's safety. The patient's presenting symptoms, physical exam findings, and initial radiographic and laboratory data in the context of their chronic comorbidities is felt to place them at high risk for further clinical deterioration. Furthermore, it is not anticipated that the patient will be medically stable for discharge from the hospital within 2 midnights of admission.   * I certify that at the point of admission it is my clinical judgment that the patient will require inpatient hospital care spanning beyond 2 midnights from the point of admission due to high intensity of service, high risk for further deterioration and high frequency of surveillance required.*  Author: Deliliah Room, MD 11/08/2024 8:26 AM  For on call review [email or web address].

## 2024-11-08 NOTE — ED Triage Notes (Signed)
 Pt c/o bloody urine in catheter that started around lunch today. Pt is currently on antibiotics for infection.

## 2024-11-08 NOTE — ED Provider Notes (Signed)
 [place] EMERGENCY DEPARTMENT AT [facility] Provider Note   CSN: [id number] Arrival date & time: 11/08/24  0209     Patient presents with: Hematuria   Travis Woodward. is a 83 y.o. male.   HPI Patient presents for hematuria.  Medical history includes HTN, HLD, CAD, CKD, enlarged prostate, anemia.  He is prescribed Eliquis .  He was seen in the ED twice a week ago for Foley catheter issues.  2 days ago, he was called to inform that his urine culture grew a bacteria and he was started on antibiotic.  Earlier today, he was in his normal state of health.  At around noon, he felt a sharp pain in his suprapubic area and had onset of hematuria since then.  He has not had any further episodes of pain or discomfort.  He has an appointment with urology in 5 days.    Prior to Admission medications   Medication Sig Start Date End Date Taking? Authorizing Provider  apixaban  (ELIQUIS ) 5 MG TABS tablet Take 1 tablet (5 mg total) by mouth 2 (two) times daily. 10/31/24   Sheikh, Omair Latif, DO  aspirin  EC 81 MG tablet Take 81 mg by mouth daily.    [provider]  cephALEXin (KEFLEX) 500 MG capsule Take 1 capsule (500 mg total) by mouth 4 (four) times daily. 11/02/24   Robinson, John K, PA-C  Cholecalciferol  (VITAMIN D  HIGH POTENCY PO) Take 5,000 Units by mouth daily.    [provider]  Cyanocobalamin (VITAMIN B-12 IJ) Inject 1 mL as directed every 30 (thirty) days.     [provider]  docusate sodium  (COLACE) 100 MG capsule Take 1 capsule (100 mg total) by mouth 2 (two) times daily. Patient taking differently: Take 100 mg by mouth at bedtime. 08/26/23   Drusilla Sabas RAMAN, MD  finasteride  (PROSCAR ) 5 MG tablet Take 1 tablet (5 mg total) by mouth daily. 10/30/24   Nieves Cough, MD  nitroGLYCERIN  (NITROSTAT ) 0.4 MG SL tablet Place 0.4 mg under the tongue every 5 (five) minutes as needed for chest pain. 06/23/17   [provider]  ondansetron  (ZOFRAN ) 4 MG  tablet Take 1 tablet (4 mg total) by mouth every 6 (six) hours as needed for nausea. 10/31/24   Sherrill Cable Latif, DO  oxyCODONE  (ROXICODONE ) 15 MG immediate release tablet Take 15 mg by mouth every 8 (eight) hours as needed for pain. 10/03/24   [provider]  polyethylene glycol (MIRALAX  / GLYCOLAX ) 17 g packet Take 17 g by mouth daily. Patient taking differently: Take 17 g by mouth at bedtime. 08/27/23   Drusilla Sabas RAMAN, MD  tamsulosin  (FLOMAX ) 0.4 MG CAPS capsule Take 1 capsule (0.4 mg total) by mouth daily. 10/30/24   Nieves Cough, MD    Allergies: Crestor [rosuvastatin] and Paroxetine    Review of Systems  Genitourinary:  Positive for hematuria.  All other systems reviewed and are negative.   Updated Vital Signs BP 112/63   Pulse 91   Temp 98.3 F (36.8 C) (Oral)   Resp 18   Ht 6' 2 (1.88 m)   Wt 69.9 kg   SpO2 96%   BMI 19.79 kg/m   Physical Exam Vitals and nursing note reviewed.  Constitutional:      General: He is not in acute distress.    Appearance: Normal appearance. He is well-developed. He is not ill-appearing, toxic-appearing or diaphoretic.  HENT:     Head: Normocephalic and atraumatic.  Right Ear: External ear normal.     Left Ear: External ear normal.     Nose: Nose normal.     Mouth/Throat:     Mouth: Mucous membranes are moist.  Eyes:     Extraocular Movements: Extraocular movements intact.     Conjunctiva/sclera: Conjunctivae normal.  Cardiovascular:     Rate and Rhythm: Normal rate and regular rhythm.  Pulmonary:     Effort: Pulmonary effort is normal. No respiratory distress.  Abdominal:     General: There is no distension.     Palpations: Abdomen is soft.     Tenderness: There is no abdominal tenderness.  Musculoskeletal:        General: No swelling.     Cervical back: Normal range of motion and neck supple.  Skin:    General: Skin is warm and dry.     Coloration: Skin is not jaundiced or pale.  Neurological:     General:  No focal deficit present.     Mental Status: He is alert and oriented to person, place, and time.  Psychiatric:        Mood and Affect: Mood normal.        Behavior: Behavior normal.     (all labs ordered are listed, but only abnormal results are displayed) Labs Reviewed  COMPREHENSIVE METABOLIC PANEL WITH GFR - Abnormal; Notable for the following components:      Result Value   Glucose, Bld 110 (*)    Creatinine, Ser 1.56 (*)    AST 13 (*)    GFR, Estimated 44 (*)    All other components within normal limits  CBC WITH DIFFERENTIAL/PLATELET - Abnormal; Notable for the following components:   RBC 3.39 (*)    Hemoglobin 10.4 (*)    HCT 32.6 (*)    RDW 16.3 (*)    Platelets 148 (*)    All other components within normal limits  URINALYSIS, ROUTINE W REFLEX MICROSCOPIC - Abnormal; Notable for the following components:   Color, Urine AMBER (*)    Specific Gravity, Urine 1.004 (*)    Hgb urine dipstick MODERATE (*)    Protein, ur 100 (*)    Leukocytes,Ua SMALL (*)    Bacteria, UA RARE (*)    All other components within normal limits  HEMOGLOBIN AND HEMATOCRIT, BLOOD - Abnormal; Notable for the following components:   Hemoglobin 10.1 (*)    HCT 31.3 (*)    All other components within normal limits  PROTIME-INR  HEMOGLOBIN AND HEMATOCRIT, BLOOD    EKG: None  Radiology: CT RENAL STONE STUDY Result Date: 11/08/2024 EXAM: CT UROGRAM 11/08/2024 02:50:59 AM TECHNIQUE: CT of the abdomen and pelvis was performed without the administration of intravenous contrast as per CT urogram protocol. Multiplanar reformatted images as well as MIP urogram images are provided for review. Automated exposure control, iterative reconstruction, and/or weight based adjustment of the mA/kV was utilized to reduce the radiation dose to as low as reasonably achievable. COMPARISON: 10/28/2024. 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 CLINICAL HISTORY: Abdominal/flank pain, stone suspected.  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 LIVER: The liver is unremarkable. GALLBLADDER AND BILE DUCTS: Status post cholecystectomy. No biliary ductal dilatation. SPLEEN: No acute abnormality. PANCREAS: No acute abnormality. ADRENAL GLANDS: No acute abnormality. KIDNEYS, URETERS AND BLADDER: No stones in the kidneys or ureters. No hydronephrosis. No perinephric or periureteral stranding. Thick walled bladder, suggesting cystitis, with indwelling foley catheter and associated bladder hemorrhage (image  86), improved. Prostatomegaly, indenting the base of the bladder, suggesting BPH. GI AND BOWEL: Stomach demonstrates no acute abnormality. There is no bowel obstruction. Moderate colonic stool burden, suggesting mild constipation. PERITONEUM AND RETROPERITONEUM: No ascites. No free air. VASCULATURE: Aorta is normal in caliber. Atherosclerotic calcifications of the abdominal aorta and branch vessels. Right external iliac stent. LYMPH NODES: No lymphadenopathy. REPRODUCTIVE ORGANS: Prostatomegaly, indenting the base of the bladder, suggesting BPH. BONES AND SOFT TISSUES: Left hip hemiarthroplasty with streak artifact across the pelvis. Prior vertebral augmentation at T12. Mild superior endplate compression fracture deformity at T11 and L3, chronic. No focal soft tissue abnormality. IMPRESSION: 1. Thick-walled bladder, suggesting cystitis. 2. Indwelling Foley catheter and associated bladder hemorrhage, improved. 3. Prostatomegaly, suggesting BPH. Electronically signed by: Pinkie Pebbles MD 11/08/2024 02:55 AM EST RP Workstation: [id number]     Procedures   Medications Ordered in the ED  0.9 %  sodium chloride  infusion ( Intravenous New Bag/Given 11/08/24 0643)  acetaminophen  (TYLENOL ) tablet 500 mg (has no administration in time range)  prochlorperazine (COMPAZINE) injection 5 mg (has no administration in time range)  polyethylene glycol (MIRALAX  / GLYCOLAX ) packet 17 g (has  no administration in time range)  oxyCODONE  (Oxy IR/ROXICODONE ) immediate release tablet 5 mg (has no administration in time range)  tamsulosin  (FLOMAX ) capsule 0.4 mg (has no administration in time range)  melatonin tablet 6 mg (has no administration in time range)                                    Medical Decision Making Amount and/or Complexity of Data Reviewed Labs: ordered. Radiology: ordered.  Risk Decision regarding hospitalization.   This patient presents to the ED for concern of hematuria, this involves an extensive number of treatment options, and is a complaint that carries with it a high risk of complications and morbidity.  The differential diagnosis includes bladder variceal bleed, neoplasm, nephrolithiasis, trauma, bleeding diathesis   Co morbidities / Chronic conditions that complicate the patient evaluation  HTN, HLD, CAD, CKD, enlarged prostate, anemia   Additional history obtained:  Additional history obtained from EMR External records from outside source obtained and reviewed including N/A   Lab Tests:  I Ordered, and personally interpreted labs.  The pertinent results include: Baseline anemia, no leukocytosis, baseline creatinine, normal electrolytes   Imaging Studies ordered:  I ordered imaging studies including CT of abdomen and pelvis I independently visualized and interpreted imaging which showed hyperdense material in bladder consistent with blood products. I agree with the radiologist interpretation   Cardiac Monitoring: / EKG:  The patient was maintained on a cardiac monitor.  I personally viewed and interpreted the cardiac monitored which showed an underlying rhythm of: Sinus rhythm   Problem List / ED Course / Critical interventions / Medication management  Patient presenting for acute onset of hematuria today.  He has an indwelling Foley catheter.  He is currently on day 3 of antibiotics for treatment of UTI based on urine cultures  from 6 days ago.  He had a brief transient episode of suprapubic pain at around the time that the hematuria started.  He denies any symptoms since that time other than ongoing hematuria.  On arrival in the ED, he is well-appearing.  Foley bag contains gross hematuria, the color of fruit punch.  Workup was initiated.  Patient CT scan showed hyperdense material in bladder consistent with blood products.  I spoke with  urologist on-call, Dr. Cam, who recommends admission to [facility].  Patient underwent placement of a 22 French three-way catheter.  He was admitted for further management.   Consultations Obtained:  I requested consultation with the urologist, Dr. Cam,  and discussed lab and imaging findings as well as pertinent plan - they recommend: Placement of large three-way catheter and admission to [facility]   Social Determinants of Health:  Has access to outpatient care     Final diagnoses:  Gross hematuria    ED Discharge Orders     None          Melvenia Motto, MD 11/08/24 [phone]
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
## 2024-11-08 NOTE — Consult Note (Signed)
 This remote consultation was conducted using EMR wound digital images downloaded by the staff member. Pertinent information was reviewed in order to determine recommendations. Coordinate care of patient with primary nurse via Secure Chat.  WOC Nurse Consult Note: Reason for Consult: buttock wound Wound type: chronic PI Left Sacrum DTPI with sloughing Improved in appearance from images taken in early November found in chart.Combination of MASD with possible friction injuries along gluteal cleft Pressure Injury POA: Yes Measurement: wide area of involvement Wound bed: partial-thickness skin loss Drainage (amount, consistency, odor)  Periwound:erythema with scar tissue to various sites Dressing procedure/placement/frequency:  DESITIN 40 % ointment Topical, 2 times daily, Apply to buttock; gluteal fold, and perineal areas Apply standard sacrum foam dressing to pad and protect area   Buttock 11/08/24 taken by nurse   WOC will not follow and will remove patient from census task list. Please reconsult if wound worsens in condition and notify provider.   Sherrilyn Hals MSN RN CWOCN WOC [facility]  [phone] (Available from 7-3 pm Mon-Friday)

## 2024-11-08 NOTE — ED Notes (Signed)
 AC called for urology cart at [phone], still awaiting cart

## 2024-11-08 NOTE — Progress Notes (Addendum)
 [facility].  He is alert and conversant. Family at bedside. He is stable. Awaiting Urology evaluation. Nurse will page urology and informed them of patient arrival.  He report he takes oxycodone  15 mg TID PRN. Will resume/  Please See HPI and order done by Dr Deliliah Room.

## 2024-11-09 DIAGNOSIS — R310 Gross hematuria: Secondary | ICD-10-CM | POA: Diagnosis not present

## 2024-11-09 LAB — BASIC METABOLIC PANEL WITH GFR
Anion gap: 7 (ref 5–15)
BUN: 16 mg/dL (ref 8–23)
CO2: 24 mmol/L (ref 22–32)
Calcium: 9 mg/dL (ref 8.9–10.3)
Chloride: 108 mmol/L (ref 98–111)
Creatinine, Ser: 1.38 mg/dL — ABNORMAL HIGH (ref 0.61–1.24)
GFR, Estimated: 51 mL/min — ABNORMAL LOW (ref >60)
Glucose, Bld: 87 mg/dL (ref 70–99)
Potassium: 4.5 mmol/L (ref 3.5–5.1)
Sodium: 139 mmol/L (ref 135–145)

## 2024-11-09 LAB — CBC
HCT: 27.4 % — ABNORMAL LOW (ref 39.0–52.0)
Hemoglobin: 8.6 g/dL — ABNORMAL LOW (ref 13.0–17.0)
MCH: 30.1 pg (ref 26.0–34.0)
MCHC: 31.4 g/dL (ref 30.0–36.0)
MCV: 95.8 fL (ref 80.0–100.0)
Platelets: 113 K/uL — ABNORMAL LOW (ref 150–400)
RBC: 2.86 MIL/uL — ABNORMAL LOW (ref 4.22–5.81)
RDW: 16.4 % — ABNORMAL HIGH (ref 11.5–15.5)
WBC: 7.1 K/uL (ref 4.0–10.5)
nRBC: 0 % (ref 0.0–0.2)

## 2024-11-09 MED ORDER — FERROUS SULFATE 325 (65 FE) MG PO TABS
325.0000 mg | ORAL_TABLET | Freq: Every day | ORAL | Status: DC
  Administered 2024-11-09 – 2024-11-17 (×8): 325 mg via ORAL
  Filled 2024-11-09 (×8): qty 1

## 2024-11-09 MED ORDER — FINASTERIDE 5 MG PO TABS
5.0000 mg | ORAL_TABLET | Freq: Every day | ORAL | Status: DC
  Administered 2024-11-09 – 2024-11-17 (×9): 5 mg via ORAL
  Filled 2024-11-09 (×8): qty 1

## 2024-11-09 MED ORDER — SODIUM CHLORIDE 0.9 % IV SOLN: INTRAVENOUS | Status: AC

## 2024-11-09 MED ORDER — SODIUM CHLORIDE 0.9 % IV BOLUS
500.0000 mL | Freq: Once | INTRAVENOUS | Status: AC
  Administered 2024-11-09: 500 mL via INTRAVENOUS

## 2024-11-09 MED ORDER — POLYETHYLENE GLYCOL 3350 17 G PO PACK
17.0000 g | PACK | Freq: Two times a day (BID) | ORAL | Status: DC
  Administered 2024-11-09 – 2024-11-17 (×16): 17 g via ORAL
  Filled 2024-11-09 (×16): qty 1

## 2024-11-09 MED ORDER — CIPROFLOXACIN HCL 500 MG PO TABS
500.0000 mg | ORAL_TABLET | Freq: Two times a day (BID) | ORAL | Status: DC
  Administered 2024-11-09 – 2024-11-15 (×14): 500 mg via ORAL
  Filled 2024-11-09 (×14): qty 1

## 2024-11-09 MED ORDER — TRANEXAMIC ACID-NACL 1000-0.7 MG/100ML-% IV SOLN
1000.0000 mg | Freq: Once | INTRAVENOUS | Status: AC
  Administered 2024-11-09: 1000 mg via INTRAVENOUS
  Filled 2024-11-09: qty 100

## 2024-11-09 MED ORDER — FOLIC ACID 1 MG PO TABS
1.0000 mg | ORAL_TABLET | Freq: Every day | ORAL | Status: DC
  Administered 2024-11-09 – 2024-11-16 (×8): 1 mg via ORAL
  Filled 2024-11-09 (×9): qty 1

## 2024-11-09 MED ORDER — SENNOSIDES-DOCUSATE SODIUM 8.6-50 MG PO TABS
1.0000 | ORAL_TABLET | Freq: Two times a day (BID) | ORAL | Status: DC
  Administered 2024-11-09 – 2024-11-17 (×17): 1 via ORAL
  Filled 2024-11-09 (×17): qty 1

## 2024-11-09 NOTE — Plan of Care (Signed)

## 2024-11-09 NOTE — Progress Notes (Signed)
 Subjective: No acute events overnight.  CBI on very low gtt. with lightly blood-tinged clear irrigant.  Significant improvement overnight.  Travis Woodward feels well.  All questions answered to his satisfaction.  Objective: Vital signs in last 24 hours: Temp:  [97.2 F (36.2 C)-98.5 F (36.9 C)] 98.4 F (36.9 C) (11/13 0613) Pulse Rate:  [69-86] 69 (11/13 0613) Resp:  [17-18] 18 (11/12 1832) BP: (99-150)/(48-70) 99/48 (11/13 0613) SpO2:  [95 %-99 %] 95 % (11/13 9386)  Assessment/Plan: # Severe prostatomegaly # Gross hematuria  Trend labs.  Minimal bleeding at this point.  Hgb 10.4-10.1-9.4-8.6.  Some drop in Hgb from active bleeding and some from hemodilution.  He appears to be stabilized at this point  No surgical indication at this time.  CBI was barely running and was mostly clear.  Clamped this morning.  I have ordered a TXA bladder instillation to hopefully control any remaining bleeders.  If clear late morning will proceed with voiding trial  He would be a good candidate for prostatic artery embolization or possibly HoLEP.  Will review with Dr. Nieves  Intake/Output from previous day: 11/12 0701 - 11/13 0700 In: 3286.3 [P.O.:240; I.V.:1746.3] Out: 6700 [Urine:6700]  Intake/Output this shift: No intake/output data recorded.  Physical Exam:  General: Alert and oriented CV: No cyanosis Lungs: equal chest rise Abdomen: Soft, NTND, no rebound or guarding Gu: Three-way Foley catheter in place draining lightly blood-tinged irrigant.  CBI clamped on rounds.  Lab Results: Recent Labs    11/08/24 0454 11/08/24 0856 11/09/24 0549  HGB 10.1* 9.4* 8.6*  HCT 31.3* 29.2* 27.4*   BMET Recent Labs    11/08/24 0228 11/08/24 0454 11/08/24 0856 11/09/24 0549  NA 139  --   --  139  K 4.3  --   --  4.5  CL 102  --   --  108  CO2 25  --   --  24  GLUCOSE 110*  --   --  87  BUN 17  --   --  16  CREATININE 1.56*  --   --  1.38*  CALCIUM 9.5  --   --  9.0  HGB 10.4*   < >  9.4* 8.6*  WBC 9.5  --   --  7.1   < > = values in this interval not displayed.     Studies/Results: CT RENAL STONE STUDY Result Date: 11/08/2024 EXAM: CT UROGRAM 11/08/2024 02:50:59 AM TECHNIQUE: CT of the abdomen and pelvis was performed without the administration of intravenous contrast as per CT urogram protocol. Multiplanar reformatted images as well as MIP urogram images are provided for review. Automated exposure control, iterative reconstruction, and/or weight based adjustment of the mA/kV was utilized to reduce the radiation dose to as low as reasonably achievable. COMPARISON: 10/28/2024. 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 CLINICAL HISTORY: Abdominal/flank pain, stone suspected. 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 LIVER: The liver is unremarkable. GALLBLADDER AND BILE DUCTS: Status post cholecystectomy. No biliary ductal dilatation. SPLEEN: No acute abnormality. PANCREAS: No acute abnormality. ADRENAL GLANDS: No acute abnormality. KIDNEYS, URETERS AND BLADDER: No stones in the kidneys or ureters. No hydronephrosis. No perinephric or periureteral stranding. Thick walled bladder, suggesting cystitis, with indwelling foley catheter and associated bladder hemorrhage (image 86), improved. Prostatomegaly, indenting the base of the bladder, suggesting BPH. GI AND BOWEL: Stomach demonstrates no acute abnormality. There is no bowel obstruction. Moderate colonic stool burden, suggesting mild constipation. PERITONEUM AND RETROPERITONEUM:  No ascites. No free air. VASCULATURE: Aorta is normal in caliber. Atherosclerotic calcifications of the abdominal aorta and branch vessels. Right external iliac stent. LYMPH NODES: No lymphadenopathy. REPRODUCTIVE ORGANS: Prostatomegaly, indenting the base of the bladder, suggesting BPH. BONES AND SOFT TISSUES: Left hip hemiarthroplasty with streak artifact across the pelvis. Prior vertebral augmentation at T12.  Mild superior endplate compression fracture deformity at T11 and L3, chronic. No focal soft tissue abnormality. IMPRESSION: 1. Thick-walled bladder, suggesting cystitis. 2. Indwelling Foley catheter and associated bladder hemorrhage, improved. 3. Prostatomegaly, suggesting BPH. Electronically signed by: Pinkie Pebbles MD 11/08/2024 02:55 AM EST RP Workstation: [id number]      LOS: 1 day   [name] [place], NP [facility] Pager: [phone]  11/09/2024, 8:41 AM

## 2024-11-09 NOTE — Progress Notes (Signed)
 PROGRESS NOTE    Travis Woodward.  FMW:[id number] DOB: [birth year] DOA: 11/08/2024 PCP: Orpha Yancey LABOR, MD   Brief Narrative: 83 year old with past medical history significant for hypertension, CKD stage IIIa, BPH presents to the ED with hematuria.  Of note patient was recently admitted at [facility] facility for management of hematuria.  He was on Eliquis  which was held.  Patient underwent cystoscopy with clot evacuation and fulguration by Dr. Guerron  on 10/29/2024.  Urology consulted and recommended placing a three-way Foley catheter, patient was transferred to [name] for further evaluation.  He was still taking Eliquis .   Assessment & Plan:   Active Problems:   Hematuria   Hematuria of unknown cause   1-Hematuria:  UTI BPH LUTS Recurrent hematuria aggravated by anticoagulation, Eliquis , aspirin  Three-way Foley catheter in place. Continue to monitor hemoglobin Slowly trending down today at 8.7 Urine less bloody  He was started on bladder irrigation yesterday.  Appreciate Urology evaluation  Urine culture growing: More than 100 K colonies of Morganella morganii sensitive to ciprofloxacin . Started  ciprofloxacin    Acute blood loss anemia: In the setting of hematuria -Hemoglobin baseline 10 -Hemoglobin trending down 8.6 Start iron supplement  Monitor.   Hypertension: - Hydralazine  as needed  BPH: - Continue Flomax   CKD 3A: -Creatinine baseline 1.2----1.4 - Scented with a creatinine of 1.5. - Renal function stable creatinine down to 1.3  Thrombocytopenia: Suspect related to acute blood loss  History of DVT S/p penumbra thrombectomy and right common iliac vein stenting.  History of CAD status post stent to the LAD On baby aspirin , currently on hold  Chronic pain: Continue oxycodone  15 mg 3 times daily  Constipation: Started Miralax , senna See wound care documentation below pressure injury buttocks left deep tissue present on admission. Wound 11/08/24  1109 Pressure Injury Buttocks Left Deep Tissue Pressure Injury - Purple or maroon localized area of discolored intact skin or blood-filled blister due to damage of underlying soft tissue from pressure and/or shear. (Active)          Estimated body mass index is 19.79 kg/m as calculated from the following:   Height as of this encounter: 6' 2 (1.88 m).   Weight as of this encounter: 69.9 kg.   DVT prophylaxis: SCD Code Status: Full code Family Communication: Disposition Plan:  Status is: Inpatient Remains inpatient appropriate because: management of hematuria, acute blood loss.     Consultants:  Urology   Procedures:  Foley placement.   Antimicrobials:    Subjective: He is feeling well. Chronic leg, hip pain is controlled.  No BM    Objective: Vitals:   11/08/24 1832 11/08/24 2215 11/09/24 0220 11/09/24 0613  BP: (!) 100/56 (!) 100/57 102/70 (!) 99/48  Pulse: 86 78 74 69  Resp: 18     Temp: (!) 97.4 F (36.3 C) 98.5 F (36.9 C) 98.3 F (36.8 C) 98.4 F (36.9 C)  TempSrc: Oral Oral Oral Oral  SpO2: 99% 98% 97% 95%  Weight:      Height:        Intake/Output Summary (Last 24 hours) at 11/09/2024 0732 Last data filed at 11/09/2024 0618 Gross per 24 hour  Intake 3286.25 ml  Output 6700 ml  Net -3413.75 ml   Filed Weights   11/08/24 0224  Weight: 69.9 kg    Examination:  General exam: Appears calm and comfortable  Respiratory system: Clear to auscultation. Respiratory effort normal. Cardiovascular system: S1 & S2 heard, RRR. No JVD, murmurs, rubs,  gallops or clicks. No pedal edema. Gastrointestinal system: Abdomen is nondistended, soft and nontender. No organomegaly or masses felt. Normal bowel sounds heard. Central nervous system: Alert and oriented.  Extremities: Symmetric 5 x 5 power.    Data Reviewed: I have personally reviewed following labs and imaging studies  CBC: Recent Labs  Lab 11/08/24 0228 11/08/24 0454 11/08/24 0856  11/09/24 0549  WBC 9.5  --   --  7.1  NEUTROABS 6.5  --   --   --   HGB 10.4* 10.1* 9.4* 8.6*  HCT 32.6* 31.3* 29.2* 27.4*  MCV 96.2  --   --  95.8  PLT 148*  --   --  113*   Basic Metabolic Panel: Recent Labs  Lab 11/08/24 0228 11/09/24 0549  NA 139 139  K 4.3 4.5  CL 102 108  CO2 25 24  GLUCOSE 110* 87  BUN 17 16  CREATININE 1.56* 1.38*  CALCIUM 9.5 9.0   GFR: Estimated Creatinine Clearance: 40.1 mL/min (A) (by C-G formula based on SCr of 1.38 mg/dL (H)). Liver Function Tests: Recent Labs  Lab 11/08/24 0228  AST 13*  ALT 6  ALKPHOS 114  BILITOT 0.3  PROT 6.9  ALBUMIN 4.1   No results for input(s): LIPASE, AMYLASE in the last 168 hours. No results for input(s): AMMONIA in the last 168 hours. Coagulation Profile: Recent Labs  Lab 11/08/24 0228  INR 1.1   Cardiac Enzymes: No results for input(s): CKTOTAL, CKMB, CKMBINDEX, TROPONINI in the last 168 hours. BNP (last 3 results) No results for input(s): PROBNP in the last 8760 hours. HbA1C: No results for input(s): HGBA1C in the last 72 hours. CBG: No results for input(s): GLUCAP in the last 168 hours. Lipid Profile: No results for input(s): CHOL, HDL, LDLCALC, TRIG, CHOLHDL, LDLDIRECT in the last 72 hours. Thyroid  Function Tests: No results for input(s): TSH, T4TOTAL, FREET4, T3FREE, THYROIDAB in the last 72 hours. Anemia Panel: No results for input(s): VITAMINB12, FOLATE, FERRITIN, TIBC, IRON, RETICCTPCT in the last 72 hours. Sepsis Labs: No results for input(s): PROCALCITON, LATICACIDVEN in the last 168 hours.  Recent Results (from the past 240 hours)  Urine Culture     Status: Abnormal   Collection Time: 11/02/24 11:45 AM   Specimen: Urine, Clean Catch  Result Value Ref Range Status   Specimen Description   Final    URINE, CLEAN CATCH Performed at [facility], [address], [place], [place] [address]    Special Requests   Final     NONE Performed at [facility], [address], [place], [place] [address]    Culture >=100,000 COLONIES/mL [facility] MORGANII (A)  Final   Report Status 11/04/2024 FINAL  Final   Organism ID, Bacteria MORGANELLA MORGANII (A)  Final      Susceptibility   Morganella morganii - MIC*    AMPICILLIN >=32 RESISTANT Resistant     ERTAPENEM  <=0.12 SENSITIVE Sensitive     CIPROFLOXACIN  <=0.06 SENSITIVE Sensitive     GENTAMICIN <=1 SENSITIVE Sensitive     NITROFURANTOIN 128 RESISTANT Resistant     TRIMETH /SULFA  <=20 SENSITIVE Sensitive     AMPICILLIN/SULBACTAM >=32 RESISTANT Resistant     PIP/TAZO Value in next row Sensitive      <=4 SENSITIVEThis is a modified FDA-approved test that has been validated and its performance characteristics determined by the reporting laboratory.  This laboratory is certified under the Clinical Laboratory Improvement Amendments CLIA as qualified to perform high complexity clinical laboratory testing.  MEROPENEM Value in next row Sensitive      <=4 SENSITIVEThis is a modified FDA-approved test that has been validated and its performance characteristics determined by the reporting laboratory.  This laboratory is certified under the Clinical Laboratory Improvement Amendments CLIA as qualified to perform high complexity clinical laboratory testing.    * >=100,000 COLONIES/mL [facility] MORGANII         Radiology Studies: CT RENAL STONE STUDY Result Date: 11/08/2024 EXAM: CT UROGRAM 11/08/2024 02:50:59 AM TECHNIQUE: CT of the abdomen and pelvis was performed without the administration of intravenous contrast as per CT urogram protocol. Multiplanar reformatted images as well as MIP urogram images are provided for review. Automated exposure control, iterative reconstruction, and/or weight based adjustment of the mA/kV was utilized to reduce the radiation dose to as low as reasonably achievable. COMPARISON: 10/28/2024. 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  CLINICAL HISTORY: Abdominal/flank pain, stone suspected. 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 LIVER: The liver is unremarkable. GALLBLADDER AND BILE DUCTS: Status post cholecystectomy. No biliary ductal dilatation. SPLEEN: No acute abnormality. PANCREAS: No acute abnormality. ADRENAL GLANDS: No acute abnormality. KIDNEYS, URETERS AND BLADDER: No stones in the kidneys or ureters. No hydronephrosis. No perinephric or periureteral stranding. Thick walled bladder, suggesting cystitis, with indwelling foley catheter and associated bladder hemorrhage (image 86), improved. Prostatomegaly, indenting the base of the bladder, suggesting BPH. GI AND BOWEL: Stomach demonstrates no acute abnormality. There is no bowel obstruction. Moderate colonic stool burden, suggesting mild constipation. PERITONEUM AND RETROPERITONEUM: No ascites. No free air. VASCULATURE: Aorta is normal in caliber. Atherosclerotic calcifications of the abdominal aorta and branch vessels. Right external iliac stent. LYMPH NODES: No lymphadenopathy. REPRODUCTIVE ORGANS: Prostatomegaly, indenting the base of the bladder, suggesting BPH. BONES AND SOFT TISSUES: Left hip hemiarthroplasty with streak artifact across the pelvis. Prior vertebral augmentation at T12. Mild superior endplate compression fracture deformity at T11 and L3, chronic. No focal soft tissue abnormality. IMPRESSION: 1. Thick-walled bladder, suggesting cystitis. 2. Indwelling Foley catheter and associated bladder hemorrhage, improved. 3. Prostatomegaly, suggesting BPH. Electronically signed by: Pinkie Pebbles MD 11/08/2024 02:55 AM EST RP Workstation: [id number]        Scheduled Meds:  Chlorhexidine  Gluconate Cloth  6 each Topical Daily   liver oil-zinc oxide   Topical BID   tamsulosin   0.4 mg Oral Daily   Continuous Infusions:  sodium chloride  irrigation       LOS: 1 day    Time spent: 35 Minutes    Taniela Feltus A Geniya Fulgham, MD [facility]   If 7PM-7AM, please contact night-coverage [email or web address]  11/09/2024, 7:32 AM

## 2024-11-09 NOTE — Plan of Care (Signed)
 Came by to check on Travis Woodward.  CBI clamped since this morning and now s/p TXA bladder wash.  Hematuria has resolved.  Considering that he has had multiple hospitalizations in the recent past surrounding his prostatomegaly and hematuria, I will consult interventional radiology to consider him for prostatic artery embolization.  If not able to be performed while inpatient, hopefully he can be scheduled sometime in the near future.  As it is late in the day, we will proceed with voiding trial first thing tomorrow morning and consider discharge if successful.

## 2024-11-09 NOTE — Progress Notes (Signed)
 Mobility Specialist - Progress Note   11/09/24 1511  Mobility  Activity Ambulated with assistance  Level of Assistance Standby assist, set-up cues, supervision of patient - no hands on  Assistive Device Front wheel walker  Distance Ambulated (ft) 700 ft  Range of Motion/Exercises Active  Activity Response Tolerated well  Mobility visit 1 Mobility  Mobility Specialist Start Time (ACUTE ONLY) 1500  Mobility Specialist Stop Time (ACUTE ONLY) 1511  Mobility Specialist Time Calculation (min) (ACUTE ONLY) 11 min   Pt was found in bed and agreeable to mobilize. No complaints. At EOS returned to bed with all needs met. Call bell in reach.   Erminio Leos,  Mobility Specialist Can be reached via Secure Chat

## 2024-11-09 NOTE — Plan of Care (Signed)
 Request to IR for possible non emergent PAE. Patient with history of severe prostatomegaly, hematuria, urinary retention requiring multiple hospital admissions and foley catheters. He is currently undergoing CBI and is planned for voiding trial tomorrow morning per urology note.  Patient reviewed by Dr. Jennefer - recommends outpatient IR clinic follow up with him to discuss PAE if voiding trial is successful. If unsuccessful, will review with IR attending on 11/14 to discuss inpatient PAE.   IR will follow up tomorrow after voiding trial to determine next steps. Please call with questions or concerns.  Clotilda Hesselbach, PA-C

## 2024-11-09 NOTE — TOC Initial Note (Signed)
 Transition of Care (TOC) - Initial/Assessment Note    Patient Details  Name: Travis Woodward. MRN: [id number] Date of Birth: 07-17-1941  Transition of Care [facility]) CM/SW Contact:    Bascom Service, RN Phone Number: 11/09/2024, 3:44 PM  Clinical Narrative:d/c plan home Has own transport.                   Expected Discharge Plan: Home/Self Care Barriers to Discharge: Continued Medical Work up   Patient Goals and CMS Choice Patient states their goals for this hospitalization and ongoing recovery are:: Home CMS Medicare.gov Compare Post Acute Care list provided to:: Patient Choice offered to / list presented to : Patient [place] ownership interest in [facility]provided to:: Patient    Expected Discharge Plan and Services                                              Prior Living Arrangements/Services                       Activities of Daily Living   ADL Screening (condition at time of admission) Independently performs ADLs?: Yes (appropriate for developmental age) Is the patient deaf or have difficulty hearing?: Yes Does the patient have difficulty seeing, even when wearing glasses/contacts?: No Does the patient have difficulty concentrating, remembering, or making decisions?: No  Permission Sought/Granted                  Emotional Assessment              Admission diagnosis:  Gross hematuria [R31.0] Hematuria [R31.9] Chest pain [R07.9] Hematuria of unknown cause [R31.9] Patient Active Problem List   Diagnosis Date Noted   Hematuria of unknown cause 11/08/2024   Hematuria 10/28/2024   Extensive Bil DVT (deep venous thrombosis) 10/13/2023   COVID-19 07/30/2023   Palpitations 03/06/2020   Osteoporosis 10/04/2019   Hypercortisolemia 10/04/2019   Closed transcervical fracture of left femur (HCC) 06/28/2019   Hyperlipidemia 06/28/2019   Anemia 06/28/2019   Hypertension    Calculus of gallbladder without cholecystitis  without obstruction    Thrombocytopenia 07/16/2015   History of appendicitis 07/16/2015   H/O ischemic heart disease 07/16/2015   H/O normocytic normochromic anemia 07/16/2015   Anxiety about health 07/16/2015   B12 deficiency 07/16/2015   CAD S/P percutaneous coronary angioplasty--09/03/2004 01/02/2015   Subcutaneous emphysema 06/25/2014   Subarachnoid hemorrhage (HCC) 06/25/2014   Pulmonary contusion 06/25/2014   IVH (intraventricular hemorrhage) (HCC) 06/25/2014   Trauma 06/25/2014   Intracranial hemorrhage (HCC) 06/25/2014   Brain injury (HCC) 06/25/2014   Coronary artery disease without angina pectoris 06/25/2014   Hypoxia 06/24/2014   GERD 06/23/2010   WEIGHT LOSS, ABNORMAL 06/23/2010   NAUSEA, CHRONIC 06/23/2010   VERTEBRAL FRACTURE, THORACIC SPINE 07/16/2008   H N P-LUMBAR 06/25/2008   LOW BACK PAIN 06/25/2008   SCIATICA 06/25/2008   LUMBAR SPASM 06/25/2008   PCP:  Orpha Yancey LABOR, MD Pharmacy:   [facility] [place], [place] - [address] [place] [address] Phone: [phone] Fax: [phone]     Social Drivers of Health (SDOH) Social History: SDOH Screenings   Food Insecurity: No Food Insecurity (11/08/2024)  Housing: Low Risk  (11/08/2024)  Transportation Needs: No Transportation Needs (11/08/2024)  Utilities: Not At Risk (11/08/2024)  Social  Connections: Moderately Isolated (11/08/2024)  Tobacco Use: Medium Risk (11/08/2024)   SDOH Interventions:     Readmission Risk Interventions    08/01/2023   12:59 PM  Readmission Risk Prevention Plan  Post Dischage Appt Complete  Medication Screening Complete  Transportation Screening Complete

## 2024-11-10 ENCOUNTER — Inpatient Hospital Stay (HOSPITAL_COMMUNITY)

## 2024-11-10 DIAGNOSIS — R310 Gross hematuria: Secondary | ICD-10-CM | POA: Diagnosis not present

## 2024-11-10 LAB — BASIC METABOLIC PANEL WITH GFR
Anion gap: 7 (ref 5–15)
BUN: 18 mg/dL (ref 8–23)
CO2: 24 mmol/L (ref 22–32)
Calcium: 9.1 mg/dL (ref 8.9–10.3)
Chloride: 109 mmol/L (ref 98–111)
Creatinine, Ser: 1.41 mg/dL — ABNORMAL HIGH (ref 0.61–1.24)
GFR, Estimated: 49 mL/min — ABNORMAL LOW (ref >60)
Glucose, Bld: 88 mg/dL (ref 70–99)
Potassium: 4.9 mmol/L (ref 3.5–5.1)
Sodium: 140 mmol/L (ref 135–145)

## 2024-11-10 LAB — CBC
HCT: 27.3 % — ABNORMAL LOW (ref 39.0–52.0)
Hemoglobin: 8.6 g/dL — ABNORMAL LOW (ref 13.0–17.0)
MCH: 30.5 pg (ref 26.0–34.0)
MCHC: 31.5 g/dL (ref 30.0–36.0)
MCV: 96.8 fL (ref 80.0–100.0)
Platelets: 109 K/uL — ABNORMAL LOW (ref 150–400)
RBC: 2.82 MIL/uL — ABNORMAL LOW (ref 4.22–5.81)
RDW: 16.4 % — ABNORMAL HIGH (ref 11.5–15.5)
WBC: 6.7 K/uL (ref 4.0–10.5)
nRBC: 0 % (ref 0.0–0.2)

## 2024-11-10 MED ORDER — IOHEXOL 350 MG/ML SOLN
100.0000 mL | Freq: Once | INTRAVENOUS | Status: AC | PRN
  Administered 2024-11-10: 100 mL via INTRAVENOUS

## 2024-11-10 NOTE — Progress Notes (Addendum)
     Subjective: Travis Woodward was having breakfast on rounds this morning.  His urine has remained clear overnight.  Small amount of blood settled in tubing.  Plans for voiding trial this a.m.  Objective: Vital signs in last 24 hours: Temp:  [97.3 F (36.3 C)-98.5 F (36.9 C)] 98.5 F (36.9 C) (11/14 0524) Pulse Rate:  [68-74] 68 (11/14 0524) Resp:  [18] 18 (11/13 1404) BP: (101-116)/(48-59) 109/48 (11/14 0524) SpO2:  [97 %-99 %] 97 % (11/14 0524)  Assessment/Plan: # Severe prostatomegaly # Gross hematuria  Trend labs.  Hgb stable.  No evidence of active bleed  S/p 1 day of CBI and TXA bladder instillation with resolution of hematuria.  Consulted interventional radiology for prostatic artery embolization.  Addendum: Patient spontaneously started bleeding again without provocation as nursing was about to remove the catheter.  Requested for her to hand irrigate out visible clot material and restart CBI.  While we were expecting outpatient PAE, considering multiple recent hospitalizations, I would recommend he proceed with this while admitted.  Attending attestation: Patient was seen and examined on rounds this afternoon.  Urine is clear on a slow drip.  Patient very hesitant to reattempt voiding trial and go home.  We discussed more definitive procedures and the nature risks and benefits of TURP or P AE.  Will evaluate for PAE.  Appreciate IR expertise.  [name] would like to proceed with PA E.  We did discuss risk of contrast induced kidney injury as well as pelvic pain or inadvertent embolization and injury to other pelvic organs such as the bladder or colon/rectum among other risks.  All questions answered.  Again patient would like to proceed with PAE if feasible.  Pending CT evaluation and IR consult.  Discussed with NP Cam and agree with his plan.  Intake/Output from previous day: 11/13 0701 - 11/14 0700 In: 1834.7 [P.O.:480; I.V.:1354.7] Out: 2000 [Urine:2000]  Intake/Output this  shift: Total I/O In: 980 [P.O.:480; Other:500] Out: 1250 [Urine:1250]  Physical Exam:  General: Alert and oriented CV: No cyanosis Lungs: equal chest rise Abdomen: Soft, NTND, no rebound or guarding Gu: Three-way Foley catheter back on CBI, low gtt., with light pink irrigant  Lab Results: Recent Labs    11/08/24 0856 11/09/24 0549 11/10/24 0554  HGB 9.4* 8.6* 8.6*  HCT 29.2* 27.4* 27.3*   BMET Recent Labs    11/09/24 0549 11/10/24 0554  NA 139 140  K 4.5 4.9  CL 108 109  CO2 24 24  GLUCOSE 87 88  BUN 16 18  CREATININE 1.38* 1.41*  CALCIUM 9.0 9.1  HGB 8.6* 8.6*  WBC 7.1 6.7     Studies/Results: No results found.     LOS: 2 days   Travis Bourdon, NP [facility] Pager: [phone]  11/10/2024, 9:53 AM

## 2024-11-10 NOTE — Progress Notes (Signed)
 Patient approved for PAE by Dr. Hughes on Monday if he is still inpatient at that time. Originally recommended as outpatient; however, given recurrence of bleeding today (reportedly light pink with some clots present), CTA pelvis was ordered while IP and reviewed by Dr. Hughes with the above recommendation. Will add orders for NPO Sunday night, labs, etc in preparation. Team aware.    Latrenda Irani NP 11/10/2024 4:11 PM

## 2024-11-10 NOTE — Progress Notes (Addendum)
 PROGRESS NOTE    Travis Woodward.  FMW:[id number] DOB: 1941/08/10 DOA: 11/08/2024 PCP: Orpha Yancey LABOR, MD   Brief Narrative: 83 year old with past medical history significant for hypertension, CKD stage IIIa, BPH presents to the ED with hematuria.  Of note patient was recently admitted at [facility] facility for management of hematuria.  He was on Eliquis  which was held.  Patient underwent cystoscopy with clot evacuation and fulguration by Dr. Guerron  on 10/29/2024.  Urology consulted and recommended placing a three-way Foley catheter, patient was transferred to [name] for further evaluation.  He was still taking Eliquis .   Assessment & Plan:   Active Problems:   Hematuria   Hematuria of unknown cause   1-Hematuria:  UTI--related to chronic catheter  BPH LUTS Recurrent hematuria aggravated by anticoagulation, Eliquis , aspirin  Three-way Foley catheter in place. Continue to monitor hemoglobin Slowly trending down today at 8.7 Appreciate Urology evaluation  Urine culture growing: More than 100 K colonies of Morganella morganii sensitive to ciprofloxacin . Started  ciprofloxacin  Hematuria was resolving, but prior to foley removal develops hematuria again, back on bladder irrigation.  IR consulted for evaluation of PAE.   Acute blood loss anemia: In the setting of hematuria -Hemoglobin baseline 10 -Hemoglobin trending down 8.6 Started iron supplement  Monitor.   Hypertension: - Hydralazine  as needed  BPH: - Continue Flomax   CKD 3A: -Creatinine baseline 1.2----1.4 - Presented  with a creatinine of 1.5. - Renal function stable creatinine down to 1.3  Thrombocytopenia: Suspect related to acute blood loss  History of DVT S/p penumbra thrombectomy and right common iliac vein stenting.  History of CAD status post stent to the LAD On baby aspirin , currently on hold  Chronic pain: Continue oxycodone  15 mg 3 times daily  Constipation: Started Miralax , senna See  wound care documentation below pressure injury buttocks left deep tissue present on admission. Wound 11/08/24 1109 Pressure Injury Buttocks Left Deep Tissue Pressure Injury - Purple or maroon localized area of discolored intact skin or blood-filled blister due to damage of underlying soft tissue from pressure and/or shear. (Active)          Estimated body mass index is 19.79 kg/m as calculated from the following:   Height as of this encounter: 6' 2 (1.88 m).   Weight as of this encounter: 69.9 kg.   DVT prophylaxis: SCD Code Status: Full code Family Communication: Disposition Plan:  Status is: Inpatient Remains inpatient appropriate because: management of hematuria, acute blood loss.     Consultants:  Urology   Procedures:  Foley placement.   Antimicrobials:    Subjective: Notice hematuria today, just prior to catheter removal.  No other complaints.    Objective: Vitals:   11/09/24 0613 11/09/24 1404 11/09/24 2038 11/10/24 0524  BP: (!) 99/48 (!) 101/51 (!) 116/59 (!) 109/48  Pulse: 69 69 74 68  Resp:  18    Temp: 98.4 F (36.9 C) (!) 97.3 F (36.3 C) 98.1 F (36.7 C) 98.5 F (36.9 C)  TempSrc: Oral Oral Oral Oral  SpO2: 95% 99% 98% 97%  Weight:      Height:        Intake/Output Summary (Last 24 hours) at 11/10/2024 0730 Last data filed at 11/10/2024 9380 Gross per 24 hour  Intake 1834.69 ml  Output 2000 ml  Net -165.31 ml   Filed Weights   11/08/24 0224  Weight: 69.9 kg    Examination:  General exam: NAD Respiratory system: CTA Cardiovascular system: S 1, S  2 RRR Gastrointestinal system: BS present, soft, nt Central nervous system: Alert, oriented.  Extremities: no edema    Data Reviewed: I have personally reviewed following labs and imaging studies  CBC: Recent Labs  Lab 11/08/24 0228 11/08/24 0454 11/08/24 0856 11/09/24 0549 11/10/24 0554  WBC 9.5  --   --  7.1 6.7  NEUTROABS 6.5  --   --   --   --   HGB 10.4* 10.1* 9.4*  8.6* 8.6*  HCT 32.6* 31.3* 29.2* 27.4* 27.3*  MCV 96.2  --   --  95.8 96.8  PLT 148*  --   --  113* 109*   Basic Metabolic Panel: Recent Labs  Lab 11/08/24 0228 11/09/24 0549 11/10/24 0554  NA 139 139 140  K 4.3 4.5 4.9  CL 102 108 109  CO2 25 24 24   GLUCOSE 110* 87 88  BUN 17 16 18   CREATININE 1.56* 1.38* 1.41*  CALCIUM 9.5 9.0 9.1   GFR: Estimated Creatinine Clearance: 39.2 mL/min (A) (by C-G formula based on SCr of 1.41 mg/dL (H)). Liver Function Tests: Recent Labs  Lab 11/08/24 0228  AST 13*  ALT 6  ALKPHOS 114  BILITOT 0.3  PROT 6.9  ALBUMIN 4.1   No results for input(s): LIPASE, AMYLASE in the last 168 hours. No results for input(s): AMMONIA in the last 168 hours. Coagulation Profile: Recent Labs  Lab 11/08/24 0228  INR 1.1   Cardiac Enzymes: No results for input(s): CKTOTAL, CKMB, CKMBINDEX, TROPONINI in the last 168 hours. BNP (last 3 results) No results for input(s): PROBNP in the last 8760 hours. HbA1C: No results for input(s): HGBA1C in the last 72 hours. CBG: No results for input(s): GLUCAP in the last 168 hours. Lipid Profile: No results for input(s): CHOL, HDL, LDLCALC, TRIG, CHOLHDL, LDLDIRECT in the last 72 hours. Thyroid  Function Tests: No results for input(s): TSH, T4TOTAL, FREET4, T3FREE, THYROIDAB in the last 72 hours. Anemia Panel: No results for input(s): VITAMINB12, FOLATE, FERRITIN, TIBC, IRON, RETICCTPCT in the last 72 hours. Sepsis Labs: No results for input(s): PROCALCITON, LATICACIDVEN in the last 168 hours.  Recent Results (from the past 240 hours)  Urine Culture     Status: Abnormal   Collection Time: 11/02/24 11:45 AM   Specimen: Urine, Clean Catch  Result Value Ref Range Status   Specimen Description   Final    URINE, CLEAN CATCH Performed at [facility], [address], [place], [place] [address]    Special Requests   Final    NONE Performed at [facility], [address], [place], [place] [address]    Culture >=100,000 COLONIES/mL [facility] MORGANII (A)  Final   Report Status 11/04/2024 FINAL  Final   Organism ID, Bacteria MORGANELLA MORGANII (A)  Final      Susceptibility   Morganella morganii - MIC*    AMPICILLIN >=32 RESISTANT Resistant     ERTAPENEM  <=0.12 SENSITIVE Sensitive     CIPROFLOXACIN  <=0.06 SENSITIVE Sensitive     GENTAMICIN <=1 SENSITIVE Sensitive     NITROFURANTOIN 128 RESISTANT Resistant     TRIMETH /SULFA  <=20 SENSITIVE Sensitive     AMPICILLIN/SULBACTAM >=32 RESISTANT Resistant     PIP/TAZO Value in next row Sensitive      <=4 SENSITIVEThis is a modified FDA-approved test that has been validated and its performance characteristics determined by the reporting laboratory.  This laboratory is certified under the Clinical Laboratory Improvement Amendments CLIA as qualified to perform high complexity clinical laboratory testing.  MEROPENEM Value in next row Sensitive      <=4 SENSITIVEThis is a modified FDA-approved test that has been validated and its performance characteristics determined by the reporting laboratory.  This laboratory is certified under the Clinical Laboratory Improvement Amendments CLIA as qualified to perform high complexity clinical laboratory testing.    * >=100,000 COLONIES/mL [facility] MORGANII         Radiology Studies: No results found.       Scheduled Meds:  Chlorhexidine  Gluconate Cloth  6 each Topical Daily   ciprofloxacin   500 mg Oral Q12H   ferrous sulfate   325 mg Oral Q breakfast   finasteride   5 mg Oral Daily   folic acid   1 mg Oral Daily   liver oil-zinc oxide   Topical BID   polyethylene glycol  17 g Oral BID   senna-docusate  1 tablet Oral BID   tamsulosin   0.4 mg Oral Daily   Continuous Infusions:  sodium chloride  75 mL/hr at 11/10/24 0400   sodium chloride  irrigation       LOS: 2 days    Time spent: 35 Minutes    Kuulei Kleier A Nakul Avino, MD [facility]   If 7PM-7AM, please contact night-coverage [email or web address]  11/10/2024, 7:30 AM

## 2024-11-10 NOTE — Progress Notes (Signed)
 This RN attempted to remove patient's FC for a voiding trial, per urology. However, patient's urine is now dark red with large (longer than 4 inch) clots present in catheter tubing. Urology NP Delia was contacted with update. CBI has been restarted and urine is light pink, draining to foley bag with no complications. Will continue to monitor.

## 2024-11-10 NOTE — Plan of Care (Signed)

## 2024-11-11 DIAGNOSIS — R310 Gross hematuria: Secondary | ICD-10-CM | POA: Diagnosis not present

## 2024-11-11 LAB — CBC
HCT: 31.7 % — ABNORMAL LOW (ref 39.0–52.0)
Hemoglobin: 10.1 g/dL — ABNORMAL LOW (ref 13.0–17.0)
MCH: 30.7 pg (ref 26.0–34.0)
MCHC: 31.9 g/dL (ref 30.0–36.0)
MCV: 96.4 fL (ref 80.0–100.0)
Platelets: 118 K/uL — ABNORMAL LOW (ref 150–400)
RBC: 3.29 MIL/uL — ABNORMAL LOW (ref 4.22–5.81)
RDW: 16.3 % — ABNORMAL HIGH (ref 11.5–15.5)
WBC: 6.8 K/uL (ref 4.0–10.5)
nRBC: 0 % (ref 0.0–0.2)

## 2024-11-11 LAB — PSA: Prostatic Specific Antigen: 18.91 ng/mL — ABNORMAL HIGH (ref 0.00–4.00)

## 2024-11-11 LAB — BASIC METABOLIC PANEL WITH GFR
Anion gap: 7 (ref 5–15)
BUN: 17 mg/dL (ref 8–23)
CO2: 27 mmol/L (ref 22–32)
Calcium: 9.4 mg/dL (ref 8.9–10.3)
Chloride: 105 mmol/L (ref 98–111)
Creatinine, Ser: 1.36 mg/dL — ABNORMAL HIGH (ref 0.61–1.24)
GFR, Estimated: 52 mL/min — ABNORMAL LOW (ref >60)
Glucose, Bld: 105 mg/dL — ABNORMAL HIGH (ref 70–99)
Potassium: 4.1 mmol/L (ref 3.5–5.1)
Sodium: 138 mmol/L (ref 135–145)

## 2024-11-11 NOTE — Progress Notes (Signed)
 PROGRESS NOTE    Travis Woodward.  FMW:[id number] DOB: 1941/04/29 DOA: 11/08/2024 PCP: Orpha Yancey LABOR, MD   Brief Narrative: 83 year old with past medical history significant for hypertension, CKD stage IIIa, BPH presents to the ED with hematuria.  Of note patient was recently admitted at [facility] facility for management of hematuria.  He was on Eliquis  which was held.  Patient underwent cystoscopy with clot evacuation and fulguration by Dr. Guerron  on 10/29/2024.  Urology consulted and recommended placing a three-way Foley catheter, patient was transferred [name] for [facility] evaluation.  He was still taking Eliquis .   Assessment & Plan:   Active Problems:   Hematuria   Hematuria of unknown cause   1-Hematuria:  UTI--related to chronic catheter  BPH LUTS Recurrent hematuria aggravated by anticoagulation, Eliquis , aspirin  Three-way Foley catheter in place. Continue to monitor hemoglobin Slowly trending down today at 8.7 Appreciate Urology evaluation  Urine culture growing: More than 100 K colonies of Morganella morganii sensitive to ciprofloxacin . Started  ciprofloxacin  Hematuria was resolving, but prior to foley removal develops hematuria again, back on bladder irrigation.  IR consulted for evaluation of PAE. Plan for procedure on Monday This patient benefit from definitive tx of hematuria, he has had recurrent episodes, will also need to resume anticoagulation as soon as possible when clear by urology   Acute blood loss anemia: In the setting of hematuria -Hemoglobin baseline 10 Started iron supplement  Monitor. Hb stable today   Hypertension: - Hydralazine  as needed  BPH: - Continue Flomax   CKD 3A: -Creatinine baseline 1.2----1.4 - Presented  with a creatinine of 1.5. - Renal function stable creatinine down to 1.3  Thrombocytopenia: Suspect related to acute blood loss  History of DVT S/p penumbra thrombectomy and right common iliac vein  stenting.  History of CAD status post stent to the LAD On baby aspirin , currently on hold  Chronic pain: Continue oxycodone  15 mg 3 times daily  Constipation: Started Miralax , senna See wound care documentation below pressure injury buttocks left deep tissue present on admission. Wound 11/08/24 1109 Pressure Injury Buttocks Left Deep Tissue Pressure Injury - Purple or maroon localized area of discolored intact skin or blood-filled blister due to damage of underlying soft tissue from pressure and/or shear. (Active)          Estimated body mass index is 19.79 kg/m as calculated from the following:   Height as of this encounter: 6' 2 (1.88 m).   Weight as of this encounter: 69.9 kg.   DVT prophylaxis: SCD Code Status: Full code Family Communication: Disposition Plan:  Status is: Inpatient Remains inpatient appropriate because: management of hematuria, acute blood loss.     Consultants:  Urology   Procedures:  Foley placement.   Antimicrobials:    Subjective: Urine is clear today.  He is interested in getting procedure done.    Objective: Vitals:   11/10/24 0524 11/10/24 1350 11/10/24 2033 11/11/24 0556  BP: (!) 109/48 105/62 110/64 117/62  Pulse: 68 73 73 67  Resp:  18 17 16   Temp: 98.5 F (36.9 C) 98 F (36.7 C) 98.4 F (36.9 C) 98.2 F (36.8 C)  TempSrc: Oral Oral Oral Oral  SpO2: 97% 99% 100% 97%  Weight:      Height:        Intake/Output Summary (Last 24 hours) at 11/11/2024 0731 Last data filed at 11/11/2024 0602 Gross per 24 hour  Intake 2060 ml  Output 89449 ml  Net -8490 ml  Filed Weights   11/08/24 0224  Weight: 69.9 kg    Examination:  General exam: NAD Respiratory system: CTA Cardiovascular system: S 1, S 2 RRR Gastrointestinal system: BS present, soft, nt Central nervous system: Alert, oriented.  Extremities: no edema    Data Reviewed: I have personally reviewed following labs and imaging studies  CBC: Recent Labs   Lab 11/08/24 0228 11/08/24 0454 11/08/24 0856 11/09/24 0549 11/10/24 0554  WBC 9.5  --   --  7.1 6.7  NEUTROABS 6.5  --   --   --   --   HGB 10.4* 10.1* 9.4* 8.6* 8.6*  HCT 32.6* 31.3* 29.2* 27.4* 27.3*  MCV 96.2  --   --  95.8 96.8  PLT 148*  --   --  113* 109*   Basic Metabolic Panel: Recent Labs  Lab 11/08/24 0228 11/09/24 0549 11/10/24 0554  NA 139 139 140  K 4.3 4.5 4.9  CL 102 108 109  CO2 25 24 24   GLUCOSE 110* 87 88  BUN 17 16 18   CREATININE 1.56* 1.38* 1.41*  CALCIUM 9.5 9.0 9.1   GFR: Estimated Creatinine Clearance: 39.2 mL/min (A) (by C-G formula based on SCr of 1.41 mg/dL (H)). Liver Function Tests: Recent Labs  Lab 11/08/24 0228  AST 13*  ALT 6  ALKPHOS 114  BILITOT 0.3  PROT 6.9  ALBUMIN 4.1   No results for input(s): LIPASE, AMYLASE in the last 168 hours. No results for input(s): AMMONIA in the last 168 hours. Coagulation Profile: Recent Labs  Lab 11/08/24 0228  INR 1.1   Cardiac Enzymes: No results for input(s): CKTOTAL, CKMB, CKMBINDEX, TROPONINI in the last 168 hours. BNP (last 3 results) No results for input(s): PROBNP in the last 8760 hours. HbA1C: No results for input(s): HGBA1C in the last 72 hours. CBG: No results for input(s): GLUCAP in the last 168 hours. Lipid Profile: No results for input(s): CHOL, HDL, LDLCALC, TRIG, CHOLHDL, LDLDIRECT in the last 72 hours. Thyroid  Function Tests: No results for input(s): TSH, T4TOTAL, FREET4, T3FREE, THYROIDAB in the last 72 hours. Anemia Panel: No results for input(s): VITAMINB12, FOLATE, FERRITIN, TIBC, IRON, RETICCTPCT in the last 72 hours. Sepsis Labs: No results for input(s): PROCALCITON, LATICACIDVEN in the last 168 hours.  Recent Results (from the past 240 hours)  Urine Culture     Status: Abnormal   Collection Time: 11/02/24 11:45 AM   Specimen: Urine, Clean Catch  Result Value Ref Range Status   Specimen Description    Final    URINE, CLEAN CATCH Performed at [facility], [address] [place], [place] [address]    Special Requests   Final    NONE Performed at [facility], [address], [place], [place] [address]    Culture >=100,000 COLONIES/mL [facility] MORGANII (A)  Final   Report Status 11/04/2024 FINAL  Final   Organism ID, Bacteria MORGANELLA MORGANII (A)  Final      Susceptibility   Morganella morganii - MIC*    AMPICILLIN >=32 RESISTANT Resistant     ERTAPENEM  <=0.12 SENSITIVE Sensitive     CIPROFLOXACIN  <=0.06 SENSITIVE Sensitive     GENTAMICIN <=1 SENSITIVE Sensitive     NITROFURANTOIN 128 RESISTANT Resistant     TRIMETH /SULFA  <=20 SENSITIVE Sensitive     AMPICILLIN/SULBACTAM >=32 RESISTANT Resistant     PIP/TAZO Value in next row Sensitive      <=4 SENSITIVEThis is a modified FDA-approved test that has been validated and its performance characteristics determined by  the reporting laboratory.  This laboratory is certified under the Clinical Laboratory Improvement Amendments CLIA as qualified to perform high complexity clinical laboratory testing.    MEROPENEM Value in next row Sensitive      <=4 SENSITIVEThis is a modified FDA-approved test that has been validated and its performance characteristics determined by the reporting laboratory.  This laboratory is certified under the Clinical Laboratory Improvement Amendments CLIA as qualified to perform high complexity clinical laboratory testing.    * >=100,000 COLONIES/mL [facility] MORGANII         Radiology Studies: No results found.       Scheduled Meds:  Chlorhexidine  Gluconate Cloth  6 each Topical Daily   ciprofloxacin   500 mg Oral Q12H   ferrous sulfate   325 mg Oral Q breakfast   finasteride   5 mg Oral Daily   folic acid   1 mg Oral Daily   liver oil-zinc oxide   Topical BID   polyethylene glycol  17 g Oral BID   senna-docusate  1 tablet Oral BID   tamsulosin   0.4 mg Oral Daily   Continuous Infusions:  sodium  chloride irrigation       LOS: 3 days    Time spent: 35 Minutes    Travis Alvizo A Jymir Dunaj, MD [facility]   If 7PM-7AM, please contact night-coverage [email or web address]  11/11/2024, 7:31 AM

## 2024-11-11 NOTE — Plan of Care (Signed)
  Problem: Clinical Measurements: Goal: Will remain free from infection Outcome: Progressing   Problem: Clinical Measurements: Goal: Diagnostic test results will improve Outcome: Progressing   Problem: Clinical Measurements: Goal: Cardiovascular complication will be avoided Outcome: Progressing   Problem: Clinical Measurements: Goal: Cardiovascular complication will be avoided Outcome: Progressing

## 2024-11-11 NOTE — Progress Notes (Signed)
   Subjective/Chief Complaint:  1 - Refractory Prostate Source Bleeding - 160gm prostate (mostly median lobe) by CT 2025 on finasteride  at baseline with recurrnet large gross hematuria. OR clot eva 11/2. Readmitted 11/12 and 3 way catheter placed with plan for likely prostatic artery embolization 11/17. CT 11/14 for angio mapping, no large clot in bladder that time.  Today JR is stable. CT yesterday with 160gm prostate, mostly median lobe, no large formed clot. He is on board with embolization Monday, has great attitude.   Objective: Vital signs in last 24 hours: Temp:  [98 F (36.7 C)-98.4 F (36.9 C)] 98.2 F (36.8 C) (11/15 0556) Pulse Rate:  [67-73] 67 (11/15 0556) Resp:  [16-18] 16 (11/15 0556) BP: (105-117)/(62-64) 117/62 (11/15 0556) SpO2:  [97 %-100 %] 97 % (11/15 0556) Last BM Date : 11/10/24  Intake/Output from previous day: 11/14 0701 - 11/15 0700 In: 2060 [P.O.:960] Out: 89449 [Urine:10550] Intake/Output this shift: Total I/O In: -  Out: 4700 [Urine:4700]  NAD, very pleasant NLB-RA SNTND 3 way foley on very slow irrigation with efflux nearly clear No c/c/e, SCD's in place  Lab Results:  Recent Labs    11/09/24 0549 11/10/24 0554  WBC 7.1 6.7  HGB 8.6* 8.6*  HCT 27.4* 27.3*  PLT 113* 109*   BMET Recent Labs    11/09/24 0549 11/10/24 0554  NA 139 140  K 4.5 4.9  CL 108 109  CO2 24 24  GLUCOSE 87 88  BUN 16 18  CREATININE 1.38* 1.41*  CALCIUM 9.0 9.1   PT/INR No results for input(s): LABPROT, INR in the last 72 hours. ABG No results for input(s): PHART, HCO3 in the last 72 hours.  Invalid input(s): PCO2, PO2  Studies/Results: No results found.  Anti-infectives: Anti-infectives (From admission, onward)    Start     Dose/Rate Route Frequency Ordered Stop   11/09/24 1030  ciprofloxacin  (CIPRO ) tablet 500 mg        500 mg Oral Every 12 hours 11/09/24 1017         Assessment/Plan:  1 - Refractory Prostate Source  Bleeding - continue 3 way catheter and finasteride  and plan for embolization Monday. NO proceural intervention over weekend. Consdier firmagon if develops large bleeding prior to embolization, which seems unlikely.   Will follow.    Ricardo KATHEE Alvaro Mickey. 11/11/2024

## 2024-11-11 NOTE — Consult Note (Signed)
 Chief Complaint: recurrent hematuria  Referring Provider(s): Delia Smalls, NP  Supervising Physician: Luverne Aran  Patient Status: [facility] - In-pt  History of Present Illness: Travis Biehn. is a 83 y.o. male with history significant for HTN, HLD, CKD stage 3, CAD, and enlarged prostate. He takes Eliquis  for history of DVT. He was recently hospitalized for hematuria. Patient underwent cystoscopy with clot evacuation and fulguration by Dr Georganne (urology) on 10/29/2024 and an 18 Fr three-way Foley catheter was placed. Patient was discharged on 10/31/2024 without foley catheter. Patient was seen in the ED multiple times for hematuria since that time. He returned to [facility] ED on 11/08/2024 for recurrent hematuria and a 22 Fr triple lumen foley catheter was placed. Patient was transferred to [name] for urology consultation and further evaluation. Received request for image guided prostate artery embolization.    Denies fever, chills, SHOB, CP, sore throat, N/V, abd pain, abnormal bruising, leg swelling, back pain, dizziness, lightheadedness, blood in stool.  Reports hematuria.  Allergies Reviewed:  Crestor [rosuvastatin] and Paroxetine   Patient is Full Code  Past Medical History:  Diagnosis Date   Chronic kidney disease    stage 3   Colon polyps    Complication of anesthesia    difficulty urinating    Coronary artery disease    Enlarged prostate    Hyperlipidemia    Hypertension     Past Surgical History:  Procedure Laterality Date   acd fusion & plating     BACK SURGERY     x 6   CARDIAC CATHETERIZATION  2005   CHOLECYSTECTOMY N/A 07/19/2017   Procedure: LAPAROSCOPIC CHOLECYSTECTOMY;  Surgeon: Mavis Anes, MD;  Location: [facility] ORS;  Service: General;  Laterality: N/A;   CORONARY STENT PLACEMENT  2005   CYSTOSCOPY WITH FULGERATION N/A 10/29/2024   Procedure: CYSTOSCOPY, WITH BLADDER FULGURATION,URETHRAL DILITATION;  Surgeon: Georganne Penne SAUNDERS, MD;   Location: [facility];  Service: Urology;  Laterality: N/A;   HEMORRHOID SURGERY     HIP ARTHROPLASTY Left 06/29/2019   Procedure: ARTHROPLASTY BIPOLAR HIP (HEMIARTHROPLASTY);  Surgeon: Josefina Chew, MD;  Location: [facility];  Service: Orthopedics;  Laterality: Left;   LAPAROSCOPIC APPENDECTOMY  04/17/2012   Procedure: APPENDECTOMY LAPAROSCOPIC;  Surgeon: Anes DELENA Mavis, MD;  Location: [facility];  Service: General;  Laterality: N/A;   PERIPHERAL VASCULAR INTERVENTION  10/14/2023   Procedure: PERIPHERAL VASCULAR INTERVENTION;  Surgeon: Pearline Norman RAMAN, MD;  Location: [facility] INVASIVE CV LAB;  Service: Cardiovascular;;   PERIPHERAL VASCULAR THROMBECTOMY Right 10/14/2023   Procedure: PERIPHERAL VASCULAR THROMBECTOMY;  Surgeon: Pearline Norman RAMAN, MD;  Location: [facility] INVASIVE CV LAB;  Service: Cardiovascular;  Laterality: Right;   PERIPHERAL VASCULAR ULTRASOUND/IVUS Right 10/14/2023   Procedure: Peripheral Vascular Ultrasound/IVUS;  Surgeon: Pearline Norman RAMAN, MD;  Location: [facility] INVASIVE CV LAB;  Service: Cardiovascular;  Laterality: Right;   TONSILLECTOMY        Medications: Prior to Admission medications   Medication Sig Start Date End Date Taking? Authorizing Provider  apixaban  (ELIQUIS ) 5 MG TABS tablet Take 1 tablet (5 mg total) by mouth 2 (two) times daily. 10/31/24  Yes Sheikh, Omair Latif, DO  aspirin  EC 81 MG tablet Take 81 mg by mouth daily.   Yes [provider]  Cholecalciferol  (VITAMIN D  HIGH POTENCY PO) Take 5,000 Units by mouth daily.   Yes [provider]  docusate sodium  (COLACE) 100 MG capsule Take 1 capsule (100 mg total) by mouth 2 (two) times daily. Patient taking  differently: Take 100 mg by mouth at bedtime. 08/26/23  Yes Drusilla Sabas RAMAN, MD  finasteride  (PROSCAR ) 5 MG tablet Take 1 tablet (5 mg total) by mouth daily. 10/30/24  Yes Nieves Cough, MD  nitroGLYCERIN  (NITROSTAT ) 0.4 MG SL tablet Place 0.4 mg under the tongue every 5 (five) minutes as needed for chest pain. 06/23/17   Yes [provider]  ondansetron  (ZOFRAN ) 4 MG tablet Take 1 tablet (4 mg total) by mouth every 6 (six) hours as needed for nausea. 10/31/24  Yes Sheikh, Omair Latif, DO  oxyCODONE  (ROXICODONE ) 15 MG immediate release tablet Take 15 mg by mouth every 8 (eight) hours as needed for pain. 10/03/24  Yes [provider]  polyethylene glycol (MIRALAX  / GLYCOLAX ) 17 g packet Take 17 g by mouth daily. Patient taking differently: Take 17 g by mouth at bedtime. 08/27/23  Yes Drusilla Sabas RAMAN, MD  sulfamethoxazole -trimethoprim  (BACTRIM  DS) 800-160 MG tablet Take 1 tablet by mouth 2 (two) times daily. 11/05/24  Yes [provider]  tamsulosin  (FLOMAX ) 0.4 MG CAPS capsule Take 1 capsule (0.4 mg total) by mouth daily. 10/30/24  Yes Nieves Cough, MD  cephALEXin (KEFLEX) 500 MG capsule Take 1 capsule (500 mg total) by mouth 4 (four) times daily. Patient not taking: Reported on 11/08/2024 11/02/24   Robinson, John K, PA-C  Cyanocobalamin (VITAMIN B-12 IJ) Inject 1 mL as directed every 30 (thirty) days.     [provider]     Family History  Problem Relation Age of Onset   Heart attack Mother    Tuberculosis Maternal Grandfather    Adrenal disorder Neg Hx    Colon cancer Neg Hx    Stomach cancer Neg Hx    Pancreatic cancer Neg Hx    Esophageal cancer Neg Hx     Social History   Socioeconomic History   Marital status: Married    Spouse name: Not on file   Number of children: 2   Years of education: Not on file   Highest education level: Not on file  Occupational History   Occupation: retired  Tobacco Use   Smoking status: Former    Current packs/day: 0.00    Types: Cigarettes    Quit date: 07/16/1972    Years since quitting: 52.3    Passive exposure: Past   Smokeless tobacco: Never  Vaping Use   Vaping status: Never Used  Substance and Sexual Activity   Alcohol use: No   Drug use: No   Sexual activity: Never  Other Topics Concern   Not on file  Social  History Narrative   Not on file   Social Drivers of Health   Financial Resource Strain: Not on file  Food Insecurity: No Food Insecurity (11/08/2024)   Hunger Vital Sign    Worried About Running Out of Food in the Last Year: Never true    Ran Out of Food in the Last Year: Never true  Transportation Needs: No Transportation Needs (11/08/2024)   PRAPARE - Administrator, Civil Service (Medical): No    Lack of Transportation (Non-Medical): No  Physical Activity: Not on file  Stress: Not on file  Social Connections: Moderately Isolated (11/08/2024)   Social Connection and Isolation Panel    Frequency of Communication with Friends and Family: More than three times a week    Frequency of Social Gatherings with Friends and Family: More than three times a week    Attends Religious Services: Never    Active  Member of Clubs or Organizations: No    Attends Banker Meetings: Never    Marital Status: Married     Review of Systems: A 12 point ROS discussed and pertinent positives are indicated in the HPI above.  All other systems are negative.    Vital Signs: BP 117/62 (BP Location: Left Arm)   Pulse 67   Temp 98.2 F (36.8 C) (Oral)   Resp 16   Ht 6' 2 (1.88 m)   Wt 154 lb 1.6 oz (69.9 kg)   SpO2 97%   BMI 19.79 kg/m   Advance Care Plan: no documents on file    Physical Exam Constitutional:      General: He is not in acute distress. HENT:     Mouth/Throat:     Mouth: Mucous membranes are moist.  Cardiovascular:     Rate and Rhythm: Normal rate and regular rhythm.  Pulmonary:     Effort: Pulmonary effort is normal. No respiratory distress.     Breath sounds: Normal breath sounds.  Abdominal:     General: Bowel sounds are normal.     Palpations: Abdomen is soft.     Tenderness: There is no abdominal tenderness. There is no right CVA tenderness or left CVA tenderness.  Genitourinary:    Comments: Foley catheter in place with clear yellow urine in  the bag Skin:    General: Skin is warm and dry.  Neurological:     Mental Status: He is alert and oriented to person, place, and time.  Psychiatric:        Mood and Affect: Mood normal.        Behavior: Behavior normal.     Imaging: CT ANGIO PELVIS W OR WO CONTRAST Result Date: 11/11/2024 CLINICAL DATA:  pre op eval for possible PAE EXAM: CT ANGIOGRAPHY PELVIS WITH CONTRAST TECHNIQUE: Multidetector CT imaging of the pelvis was performed using the standard protocol following the bolus administration of intravenous contrast. Multiplanar image (3D post-processing) reconstructions and MIPs were obtained to evaluate the pelvic vascular anatomy. RADIATION DOSE REDUCTION: This exam was performed according to the departmental dose-optimization program which includes automated exposure control, adjustment of the mA and/or kV according to patient size and/or use of iterative reconstruction technique. CONTRAST:  OMNIPAQUE  IOHEXOL  350 MG/ML SOLN COMPARISON:  CT [facility], 11/08/2024 and 10/28/2024. FINDINGS: VASCULAR; Pelvis: *Mild burden of distal aortic bifurcation and pelvic arterial atherosclerosis without aneurysmal dilatation *Widely patent aortic bifurcation, hypogastric, external iliac and common femoral arteries. *Internal iliac artery branching with division into the superior gluteal artery and gluteal pudendal trunk bilaterally (Yamaki group A). *Patent BILATERAL prostatic arteries, both seemingly arising from the anterior division of the internal iliac artery/gluteal pudendal trunks (type II). *RIGHT common iliac vein stent, with patency not evaluated given arterial phase study. NONVASCULAR; Urinary Tract: Foley catheter and small focus of non dependent intraluminal gas within the urinary bladder. Small volume thrombus along the LEFT posterior urinary bladder wall versus ureteral jet. No active extravasation. No distal ureteral or urinary bladder distention Bowel: Mild rectal distention with stool,  correlate for potential impaction. Imaged portions of distal small bowel and colon are nondilated. Lymphatic: No enlarged abdominal or pelvic lymph nodes. Reproductive: Prostatomegaly, measuring approximately 6.0 x 6.5 x 7.0 cm, with a calculated volume of 137 mL Other:  No pelvic ascites. Musculoskeletal: LEFT hip arthroplasty.  No suspicious bone lesion. IMPRESSION: 1. Small volume urinary bladder thrombus versus ureteral jet. No active contrast extravasation. 2. Marked prostatomegaly, measuring approximately  137 g in volume. 3. BILATERAL internal iliac artery branching into the superior gluteal artery and gluteal pudendal trunk (group A) 4. Patent BILATERAL prostatic arteries, seemingly arising from the gluteal pudendal trunks (type II). 5. Mild rectal distention with stool. Correlate for potential impaction. Additional incidental, chronic and senescent findings as above. Electronically Signed   By: Thom Hall M.D.   On: 11/11/2024 07:51   CT RENAL STONE STUDY Result Date: 11/08/2024 EXAM: CT UROGRAM 11/08/2024 02:50:59 AM TECHNIQUE: CT of the abdomen and pelvis was performed without the administration of intravenous contrast as per CT urogram protocol. Multiplanar reformatted images as well as MIP urogram images are provided for review. Automated exposure control, iterative reconstruction, and/or weight based adjustment of the mA/kV was utilized to reduce the radiation dose to as low as reasonably achievable. COMPARISON: 10/28/2024. 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 CLINICAL HISTORY: Abdominal/flank pain, stone suspected. 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 LIVER: The liver is unremarkable. GALLBLADDER AND BILE DUCTS: Status post cholecystectomy. No biliary ductal dilatation. SPLEEN: No acute abnormality. PANCREAS: No acute abnormality. ADRENAL GLANDS: No acute abnormality. KIDNEYS, URETERS AND BLADDER: No stones in the kidneys or ureters. No  hydronephrosis. No perinephric or periureteral stranding. Thick walled bladder, suggesting cystitis, with indwelling foley catheter and associated bladder hemorrhage (image 86), improved. Prostatomegaly, indenting the base of the bladder, suggesting BPH. GI AND BOWEL: Stomach demonstrates no acute abnormality. There is no bowel obstruction. Moderate colonic stool burden, suggesting mild constipation. PERITONEUM AND RETROPERITONEUM: No ascites. No free air. VASCULATURE: Aorta is normal in caliber. Atherosclerotic calcifications of the abdominal aorta and branch vessels. Right external iliac stent. LYMPH NODES: No lymphadenopathy. REPRODUCTIVE ORGANS: Prostatomegaly, indenting the base of the bladder, suggesting BPH. BONES AND SOFT TISSUES: Left hip hemiarthroplasty with streak artifact across the pelvis. Prior vertebral augmentation at T12. Mild superior endplate compression fracture deformity at T11 and L3, chronic. No focal soft tissue abnormality. IMPRESSION: 1. Thick-walled bladder, suggesting cystitis. 2. Indwelling Foley catheter and associated bladder hemorrhage, improved. 3. Prostatomegaly, suggesting BPH. Electronically signed by: Pinkie Pebbles MD 11/08/2024 02:55 AM EST RP Workstation: [id number]   CT Renal Stone Study Result Date: 10/28/2024 EXAM: CT ABDOMEN AND PELVIS WITHOUT CONTRAST 10/28/2024 02:37:45 PM TECHNIQUE: CT of the abdomen and pelvis was performed without the administration of intravenous contrast. Multiplanar reformatted images are provided for review. Automated exposure control, iterative reconstruction, and/or weight-based adjustment of the mA/kV was utilized to reduce the radiation dose to as low as reasonably achievable. COMPARISON: CT dated 11/14/2018. CLINICAL HISTORY: Abdominal/flank pain, stone suspected; Gross hematuria. 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 Calcified pleural plaque along the right anterior lower chest wall. LIVER: The  liver is unremarkable. GALLBLADDER AND BILE DUCTS: Status post cholecystectomy. No biliary ductal dilatation. SPLEEN: No acute abnormality. PANCREAS: No acute abnormality. ADRENAL GLANDS: No acute abnormality. KIDNEYS, URETERS AND BLADDER: No stones in the kidneys or ureters. No hydronephrosis. No perinephric or periureteral stranding. Indwelling foley catheter with extensive hyperdense material around the foley likely representing blood products within the bladder. GI AND BOWEL: Stomach demonstrates no acute abnormality. There is no bowel obstruction. PERITONEUM AND RETROPERITONEUM: No ascites. No free air. VASCULATURE: Aorta is normal in caliber. Prior right iliac vein stenting. LYMPH NODES: No lymphadenopathy. REPRODUCTIVE ORGANS: The pelvis is poorly visualized due to streak artifact from adjacent hip arthroplasty hardware but there is likely prostatomegaly. BONES AND SOFT TISSUES: Left hip arthroplasty hardware with adjacent streak artifact, limiting  evaluation somewhat. No acute osseous abnormality. No focal soft tissue abnormality. IMPRESSION: 1. Indwelling Foley catheter with extensive hyperdense material in the bladder, likely blood products. Cystoscopy may be helpful to exclude underlying mass. 2. Probable prostatomegaly, assessment limited by streak artifact from left hip arthroplasty hardware. Electronically signed by: Michaeline Blanch MD 10/28/2024 02:54 PM EDT RP Workstation: [id number]    Labs:  CBC: Recent Labs    11/08/24 0228 11/08/24 0454 11/08/24 0856 11/09/24 0549 11/10/24 0554 11/11/24 0805  WBC 9.5  --   --  7.1 6.7 6.8  HGB 10.4*   < > 9.4* 8.6* 8.6* 10.1*  HCT 32.6*   < > 29.2* 27.4* 27.3* 31.7*  PLT 148*  --   --  113* 109* 118*   < > = values in this interval not displayed.    COAGS: Recent Labs    10/28/24 1533 10/29/24 0513 11/08/24 0228  INR 1.2 1.2 1.1  APTT  --  33  --     BMP: Recent Labs    11/08/24 0228 11/09/24 0549 11/10/24 0554 11/11/24 0805  NA  139 139 140 138  K 4.3 4.5 4.9 4.1  CL 102 108 109 105  CO2 25 24 24 27   GLUCOSE 110* 87 88 105*  BUN 17 16 18 17   CALCIUM 9.5 9.0 9.1 9.4  CREATININE 1.56* 1.38* 1.41* 1.36*  GFRNONAA 44* 51* 49* 52*    LIVER FUNCTION TESTS: Recent Labs    10/28/24 1533 10/30/24 0445 10/31/24 0916 11/08/24 0228  BILITOT 0.6 0.5 0.5 0.3  AST 12* 11* 12* 13*  ALT 8 6 <5 6  ALKPHOS 107 104 106 114  PROT 6.4* 6.1* 6.1* 6.9  ALBUMIN 4.0 3.8 3.6 4.1    TUMOR MARKERS: No results for input(s): AFPTM, CEA, CA199, CHROMGRNA in the last 8760 hours.  Assessment and Plan: Recurrent hematuria-  Request for image guided prostate artery embolization.  Tentatively planned for Monday, 11/13/2024. Case approved by Dr Hughes. No contraindications for procedure identified in ROS, physical exam, or review of pre-sedation considerations.  Labs reviewed and within acceptable range Imaging available and reviewed by Dr Hughes VSS, afebrile Eliquis  currently on hold   The Risks and benefits of prostate artery embolization were discussed with the patient including, but not limited to bleeding, infection, vascular injury, post operative pain, or contrast induced renal failure.  This procedure involves the use of X-rays and because of the nature of the planned procedure, it is possible that we will have prolonged use of X-ray fluoroscopy.  Potential radiation risks to you include (but are not limited to) the following: - A slightly elevated risk for cancer several years later in life. This risk is typically less than 0.5% percent. This risk is low in comparison to the normal incidence of human cancer, which is 33% for women and 50% for men according to the [employer] Society. - Radiation induced injury can include skin redness, resembling a rash, tissue breakdown / ulcers and hair loss (which can be temporary or permanent).   The likelihood of either of these occurring depends on the difficulty  of the procedure and whether you are sensitive to radiation due to previous procedures, disease, or genetic conditions.   IF your procedure requires a prolonged use of radiation, you will be notified and given written instructions for further action.  It is your responsibility to monitor the irradiated area for the 2 weeks following the procedure and to notify your physician if you are concerned  that you have suffered a radiation induced injury.    All of the patient's questions were answered, patient is agreeable to proceed. Consent signed and in chart.  Procedure discussed with patient's daughter, Sari Glatter, via telephone per patient request. All questions answered to satisfaction. Daughter is agreeable to proceed.    Thank you for allowing our service to participate in Travis Woodward. 's care.    Electronically Signed: Grayton Lobo B Barrie Wale, NP   11/11/2024, 9:12 AM     I spent a total of 40 Minutes in face to face in clinical consultation, greater than 50% of which was counseling/coordinating care for image guided prostate artery embolization.     (A copy of this note was sent to the referring provider and the time of visit.)

## 2024-11-12 DIAGNOSIS — R310 Gross hematuria: Secondary | ICD-10-CM | POA: Diagnosis not present

## 2024-11-12 LAB — CBC
HCT: 33.7 % — ABNORMAL LOW (ref 39.0–52.0)
Hemoglobin: 10.5 g/dL — ABNORMAL LOW (ref 13.0–17.0)
MCH: 30.3 pg (ref 26.0–34.0)
MCHC: 31.2 g/dL (ref 30.0–36.0)
MCV: 97.4 fL (ref 80.0–100.0)
Platelets: 130 K/uL — ABNORMAL LOW (ref 150–400)
RBC: 3.46 MIL/uL — ABNORMAL LOW (ref 4.22–5.81)
RDW: 16.3 % — ABNORMAL HIGH (ref 11.5–15.5)
WBC: 13.7 K/uL — ABNORMAL HIGH (ref 4.0–10.5)
nRBC: 0 % (ref 0.0–0.2)

## 2024-11-12 LAB — BASIC METABOLIC PANEL WITH GFR
Anion gap: 12 (ref 5–15)
BUN: 18 mg/dL (ref 8–23)
CO2: 24 mmol/L (ref 22–32)
Calcium: 9.6 mg/dL (ref 8.9–10.3)
Chloride: 105 mmol/L (ref 98–111)
Creatinine, Ser: 1.42 mg/dL — ABNORMAL HIGH (ref 0.61–1.24)
GFR, Estimated: 49 mL/min — ABNORMAL LOW (ref >60)
Glucose, Bld: 125 mg/dL — ABNORMAL HIGH (ref 70–99)
Potassium: 3.8 mmol/L (ref 3.5–5.1)
Sodium: 141 mmol/L (ref 135–145)

## 2024-11-12 MED ORDER — LACTATED RINGERS IV SOLN: INTRAVENOUS | Status: AC

## 2024-11-12 NOTE — Progress Notes (Signed)
 Subjective/Chief Complaint:   1 - Refractory Prostate Source Bleeding - 160gm prostate (mostly median lobe) by CT 2025 on finasteride  at baseline with recurrnet large gross hematuria. OR clot eva 11/2. Readmitted 11/12 and 3 way catheter placed with plan for likely prostatic artery embolization 11/17. CT 11/14 for angio mapping, 160gm with large medain, no large clot in bladder that time.  Today Travis Woodward is stable. Few infrequent episodes of some small clot passage, but acute bleeding remains almost resoled. He is on board with embolization Monday, has great attitude.    Objective: Vital signs in last 24 hours: Temp:  [98.1 F (36.7 C)-99.1 F (37.3 C)] 98.1 F (36.7 C) (11/16 0418) Pulse Rate:  [70-77] 74 (11/16 0418) Resp:  [16-18] 16 (11/16 0418) BP: (103-122)/(64-106) 122/65 (11/16 0418) SpO2:  [97 %-99 %] 97 % (11/16 0418) Last BM Date : 11/10/24  Intake/Output from previous day: 11/15 0701 - 11/16 0700 In: -  Out: 6650 [Urine:6650] Intake/Output this shift: Total I/O In: 360 [P.O.:360] Out: 1600 [Urine:1600]  NAD, very pleasant, watching Popeye cartoon NLB-RA SNTND 3 way foley on very slow irrigation with efflux nearly clear No c/c/e, SCD's in place  Lab Results:  Recent Labs    11/10/24 0554 11/11/24 0805  WBC 6.7 6.8  HGB 8.6* 10.1*  HCT 27.3* 31.7*  PLT 109* 118*   BMET Recent Labs    11/10/24 0554 11/11/24 0805  NA 140 138  K 4.9 4.1  CL 109 105  CO2 24 27  GLUCOSE 88 105*  BUN 18 17  CREATININE 1.41* 1.36*  CALCIUM 9.1 9.4   PT/INR No results for input(s): LABPROT, INR in the last 72 hours. ABG No results for input(s): PHART, HCO3 in the last 72 hours.  Invalid input(s): PCO2, PO2  Studies/Results: CT ANGIO PELVIS W OR WO CONTRAST Result Date: 11/11/2024 CLINICAL DATA:  pre op eval for possible PAE EXAM: CT ANGIOGRAPHY PELVIS WITH CONTRAST TECHNIQUE: Multidetector CT imaging of the pelvis was performed using the standard  protocol following the bolus administration of intravenous contrast. Multiplanar image (3D post-processing) reconstructions and MIPs were obtained to evaluate the pelvic vascular anatomy. RADIATION DOSE REDUCTION: This exam was performed according to the departmental dose-optimization program which includes automated exposure control, adjustment of the mA and/or kV according to patient size and/or use of iterative reconstruction technique. CONTRAST:  OMNIPAQUE  IOHEXOL  350 MG/ML SOLN COMPARISON:  CT [facility], 11/08/2024 and 10/28/2024. FINDINGS: VASCULAR; Pelvis: *Mild burden of distal aortic bifurcation and pelvic arterial atherosclerosis without aneurysmal dilatation *Widely patent aortic bifurcation, hypogastric, external iliac and common femoral arteries. *Internal iliac artery branching with division into the superior gluteal artery and gluteal pudendal trunk bilaterally (Yamaki group A). *Patent BILATERAL prostatic arteries, both seemingly arising from the anterior division of the internal iliac artery/gluteal pudendal trunks (type II). *RIGHT common iliac vein stent, with patency not evaluated given arterial phase study. NONVASCULAR; Urinary Tract: Foley catheter and small focus of non dependent intraluminal gas within the urinary bladder. Small volume thrombus along the LEFT posterior urinary bladder wall versus ureteral jet. No active extravasation. No distal ureteral or urinary bladder distention Bowel: Mild rectal distention with stool, correlate for potential impaction. Imaged portions of distal small bowel and colon are nondilated. Lymphatic: No enlarged abdominal or pelvic lymph nodes. Reproductive: Prostatomegaly, measuring approximately 6.0 x 6.5 x 7.0 cm, with a calculated volume of 137 mL Other:  No pelvic ascites. Musculoskeletal: LEFT hip arthroplasty.  No suspicious bone lesion. IMPRESSION: 1. Small volume  urinary bladder thrombus versus ureteral jet. No active contrast extravasation. 2. Marked  prostatomegaly, measuring approximately 137 g in volume. 3. BILATERAL internal iliac artery branching into the superior gluteal artery and gluteal pudendal trunk (group A) 4. Patent BILATERAL prostatic arteries, seemingly arising from the gluteal pudendal trunks (type II). 5. Mild rectal distention with stool. Correlate for potential impaction. Additional incidental, chronic and senescent findings as above. Electronically Signed   By: Thom Hall M.D.   On: 11/11/2024 07:51    Anti-infectives: Anti-infectives (From admission, onward)    Start     Dose/Rate Route Frequency Ordered Stop   11/09/24 1030  ciprofloxacin  (CIPRO ) tablet 500 mg        500 mg Oral Every 12 hours 11/09/24 1017         Assessment/Plan:  1 - Refractory Prostate Source Bleeding - continue 3 way catheter and finasteride  and plan for embolization Monday. NO proceural intervention over weekend. Consdier firmagon if develops large bleeding prior to embolization, which seems unlikely.   Discussed with pt titrating irrigation to off, but he is reluctant still, understanable, will keep on very slow drip, NSG updated.   Will follow.    Ricardo KATHEE Alvaro Mickey. 11/12/2024

## 2024-11-12 NOTE — Progress Notes (Signed)
 Mobility Specialist - Progress Note   11/12/24 0929  Mobility  Activity Ambulated with assistance  Level of Assistance Modified independent, requires aide device or extra time  Assistive Device Front wheel walker  Distance Ambulated (ft) 700 ft  Range of Motion/Exercises Active  Activity Response Tolerated well  Mobility visit 1 Mobility  Mobility Specialist Start Time (ACUTE ONLY) 0900  Mobility Specialist Stop Time (ACUTE ONLY) 0915  Mobility Specialist Time Calculation (min) (ACUTE ONLY) 15 min   Pt was found in bed and agreeable to mobilize. No complaints. At EOS returned to bed with all needs met. Call bell in reach.   Erminio Leos,  Mobility Specialist Can be reached via Secure Chat

## 2024-11-12 NOTE — Progress Notes (Signed)
 PROGRESS NOTE    Travis Woodward.  FMW:[id number] DOB: 05-21-1941 DOA: 11/08/2024 PCP: Orpha Yancey LABOR, MD   Brief Narrative: 83 year old with past medical history significant for hypertension, CKD stage IIIa, BPH presents to the ED with hematuria.  Of note patient was recently admitted at [facility] facility for management of hematuria.  He was on Eliquis  which was held.  Patient underwent cystoscopy with clot evacuation and fulguration by Dr. Guerron  on 10/29/2024.  Urology consulted and recommended placing a three-way Foley catheter, patient was transferred to [name] for further evaluation.  He was still taking Eliquis .   Assessment & Plan:   Active Problems:   Hematuria   Hematuria of unknown cause   1-Hematuria:  UTI--related to chronic catheter  BPH LUTS Recurrent hematuria aggravated by anticoagulation, Eliquis , aspirin  Three-way Foley catheter in place. Continue to monitor hemoglobin Appreciate Urology evaluation  Urine culture grew: More than 100 K colonies of Morganella morganii sensitive to ciprofloxacin . Started  ciprofloxacin  IR consulted for evaluation of PAE. Plan for procedure on Monday -he pass blood clot this am, urine clear.   Acute blood loss anemia: In the setting of hematuria -Hemoglobin baseline 10 Started iron supplement  Monitor. Hb stable today   Hypertension: - Hydralazine  as needed  BPH: - Continue Flomax   CKD 3A: -Creatinine baseline 1.2----1.4 - Presented  with a creatinine of 1.5. - Renal function stable creatinine down to 1.3  Thrombocytopenia: Suspect related to acute blood loss  History of DVT S/p penumbra thrombectomy and right common iliac vein stenting.  History of CAD status post stent to the LAD On baby aspirin , currently on hold  Chronic pain: Continue oxycodone  15 mg 3 times daily  Constipation: Started Miralax , senna See wound care documentation below pressure injury buttocks left deep tissue present on  admission. Wound 11/08/24 1109 Pressure Injury Buttocks Left Deep Tissue Pressure Injury - Purple or maroon localized area of discolored intact skin or blood-filled blister due to damage of underlying soft tissue from pressure and/or shear. (Active)          Estimated body mass index is 19.79 kg/m as calculated from the following:   Height as of this encounter: 6' 2 (1.88 m).   Weight as of this encounter: 69.9 kg.   DVT prophylaxis: SCD Code Status: Full code Family Communication: Disposition Plan:  Status is: Inpatient Remains inpatient appropriate because: management of hematuria, acute blood loss.     Consultants:  Urology   Procedures:  Foley placement.   Antimicrobials:    Subjective: He pass clot early this morning.   Objective: Vitals:   11/11/24 0556 11/11/24 1309 11/11/24 2024 11/12/24 0418  BP: 117/62 (!) 119/106 103/64 122/65  Pulse: 67 70 77 74  Resp: 16 18 16 16   Temp: 98.2 F (36.8 C) 98.2 F (36.8 C) 99.1 F (37.3 C) 98.1 F (36.7 C)  TempSrc: Oral Oral Oral Oral  SpO2: 97% 99% 99% 97%  Weight:      Height:        Intake/Output Summary (Last 24 hours) at 11/12/2024 1348 Last data filed at 11/12/2024 1148 Gross per 24 hour  Intake 360 ml  Output 8650 ml  Net -8290 ml   Filed Weights   11/08/24 0224  Weight: 69.9 kg    Examination:  General exam: NAD Respiratory system: CTA Cardiovascular system: S 1, S 2 RRR Gastrointestinal system: BS present, soft, nt, nd Central nervous system: alert Extremities: no edema    Data Reviewed:  I have personally reviewed following labs and imaging studies  CBC: Recent Labs  Lab 11/08/24 0228 11/08/24 0454 11/08/24 0856 11/09/24 0549 11/10/24 0554 11/11/24 0805 11/12/24 0930  WBC 9.5  --   --  7.1 6.7 6.8 13.7*  NEUTROABS 6.5  --   --   --   --   --   --   HGB 10.4*   < > 9.4* 8.6* 8.6* 10.1* 10.5*  HCT 32.6*   < > 29.2* 27.4* 27.3* 31.7* 33.7*  MCV 96.2  --   --  95.8 96.8 96.4  97.4  PLT 148*  --   --  113* 109* 118* 130*   < > = values in this interval not displayed.   Basic Metabolic Panel: Recent Labs  Lab 11/08/24 0228 11/09/24 0549 11/10/24 0554 11/11/24 0805 11/12/24 0930  NA 139 139 140 138 141  K 4.3 4.5 4.9 4.1 3.8  CL 102 108 109 105 105  CO2 25 24 24 27 24   GLUCOSE 110* 87 88 105* 125*  BUN 17 16 18 17 18   CREATININE 1.56* 1.38* 1.41* 1.36* 1.42*  CALCIUM 9.5 9.0 9.1 9.4 9.6   GFR: Estimated Creatinine Clearance: 39 mL/min (A) (by C-G formula based on SCr of 1.42 mg/dL (H)). Liver Function Tests: Recent Labs  Lab 11/08/24 0228  AST 13*  ALT 6  ALKPHOS 114  BILITOT 0.3  PROT 6.9  ALBUMIN 4.1   No results for input(s): LIPASE, AMYLASE in the last 168 hours. No results for input(s): AMMONIA in the last 168 hours. Coagulation Profile: Recent Labs  Lab 11/08/24 0228  INR 1.1   Cardiac Enzymes: No results for input(s): CKTOTAL, CKMB, CKMBINDEX, TROPONINI in the last 168 hours. BNP (last 3 results) No results for input(s): PROBNP in the last 8760 hours. HbA1C: No results for input(s): HGBA1C in the last 72 hours. CBG: No results for input(s): GLUCAP in the last 168 hours. Lipid Profile: No results for input(s): CHOL, HDL, LDLCALC, TRIG, CHOLHDL, LDLDIRECT in the last 72 hours. Thyroid  Function Tests: No results for input(s): TSH, T4TOTAL, FREET4, T3FREE, THYROIDAB in the last 72 hours. Anemia Panel: No results for input(s): VITAMINB12, FOLATE, FERRITIN, TIBC, IRON, RETICCTPCT in the last 72 hours. Sepsis Labs: No results for input(s): PROCALCITON, LATICACIDVEN in the last 168 hours.  No results found for this or any previous visit (from the past 240 hours).        Radiology Studies: CT ANGIO PELVIS W OR WO CONTRAST Result Date: 11/11/2024 CLINICAL DATA:  pre op eval for possible PAE EXAM: CT ANGIOGRAPHY PELVIS WITH CONTRAST TECHNIQUE: Multidetector CT imaging  of the pelvis was performed using the standard protocol following the bolus administration of intravenous contrast. Multiplanar image (3D post-processing) reconstructions and MIPs were obtained to evaluate the pelvic vascular anatomy. RADIATION DOSE REDUCTION: This exam was performed according to the departmental dose-optimization program which includes automated exposure control, adjustment of the mA and/or kV according to patient size and/or use of iterative reconstruction technique. CONTRAST:  OMNIPAQUE  IOHEXOL  350 MG/ML SOLN COMPARISON:  CT AP, 11/08/2024 and 10/28/2024. FINDINGS: VASCULAR; Pelvis: *Mild burden of distal aortic bifurcation and pelvic arterial atherosclerosis without aneurysmal dilatation *Widely patent aortic bifurcation, hypogastric, external iliac and common femoral arteries. *Internal iliac artery branching with division into the superior gluteal artery and gluteal pudendal trunk bilaterally (Yamaki group A). *Patent BILATERAL prostatic arteries, both seemingly arising from the anterior division of the internal iliac artery/gluteal pudendal trunks (type II). *RIGHT common iliac  vein stent, with patency not evaluated given arterial phase study. NONVASCULAR; Urinary Tract: Foley catheter and small focus of non dependent intraluminal gas within the urinary bladder. Small volume thrombus along the LEFT posterior urinary bladder wall versus ureteral jet. No active extravasation. No distal ureteral or urinary bladder distention Bowel: Mild rectal distention with stool, correlate for potential impaction. Imaged portions of distal small bowel and colon are nondilated. Lymphatic: No enlarged abdominal or pelvic lymph nodes. Reproductive: Prostatomegaly, measuring approximately 6.0 x 6.5 x 7.0 cm, with a calculated volume of 137 mL Other:  No pelvic ascites. Musculoskeletal: LEFT hip arthroplasty.  No suspicious bone lesion. IMPRESSION: 1. Small volume urinary bladder thrombus versus ureteral  jet. No active contrast extravasation. 2. Marked prostatomegaly, measuring approximately 137 g in volume. 3. BILATERAL internal iliac artery branching into the superior gluteal artery and gluteal pudendal trunk (group A) 4. Patent BILATERAL prostatic arteries, seemingly arising from the gluteal pudendal trunks (type II). 5. Mild rectal distention with stool. Correlate for potential impaction. Additional incidental, chronic and senescent findings as above. Electronically Signed   By: Thom Hall M.D.   On: 11/11/2024 07:51         Scheduled Meds:  Chlorhexidine  Gluconate Cloth  6 each Topical Daily   ciprofloxacin   500 mg Oral Q12H   ferrous sulfate   325 mg Oral Q breakfast   finasteride   5 mg Oral Daily   folic acid   1 mg Oral Daily   liver oil-zinc oxide   Topical BID   polyethylene glycol  17 g Oral BID   senna-docusate  1 tablet Oral BID   tamsulosin   0.4 mg Oral Daily   Continuous Infusions:  sodium chloride  irrigation       LOS: 4 days    Time spent: 35 Minutes    Lilliane Sposito A Hollie Wojahn, MD [facility]   If 7PM-7AM, please contact night-coverage [email or web address]  11/12/2024, 1:48 PM

## 2024-11-13 ENCOUNTER — Ambulatory Visit

## 2024-11-13 ENCOUNTER — Inpatient Hospital Stay (HOSPITAL_COMMUNITY)

## 2024-11-13 DIAGNOSIS — R310 Gross hematuria: Secondary | ICD-10-CM | POA: Diagnosis not present

## 2024-11-13 HISTORY — PX: IR US GUIDE VASC ACCESS LEFT: IMG2389

## 2024-11-13 HISTORY — PX: IR ANGIOGRAM PELVIS SELECTIVE OR SUPRASELECTIVE: IMG661

## 2024-11-13 HISTORY — PX: IR EMBO ART  VEN HEMORR LYMPH EXTRAV  INC GUIDE ROADMAPPING: IMG5450

## 2024-11-13 HISTORY — PX: IR 3D INDEPENDENT WKST: IMG2385

## 2024-11-13 HISTORY — PX: IR ANGIOGRAM SELECTIVE EACH ADDITIONAL VESSEL: IMG667

## 2024-11-13 HISTORY — PX: IR AORTA/THORACIC: IMG634

## 2024-11-13 LAB — CBC
HCT: 29.2 % — ABNORMAL LOW (ref 39.0–52.0)
Hemoglobin: 9.3 g/dL — ABNORMAL LOW (ref 13.0–17.0)
MCH: 30.9 pg (ref 26.0–34.0)
MCHC: 31.8 g/dL (ref 30.0–36.0)
MCV: 97 fL (ref 80.0–100.0)
Platelets: 106 K/uL — ABNORMAL LOW (ref 150–400)
RBC: 3.01 MIL/uL — ABNORMAL LOW (ref 4.22–5.81)
RDW: 16.3 % — ABNORMAL HIGH (ref 11.5–15.5)
WBC: 9.9 K/uL (ref 4.0–10.5)
nRBC: 0 % (ref 0.0–0.2)

## 2024-11-13 LAB — COMPREHENSIVE METABOLIC PANEL WITH GFR
ALT: <5 U/L (ref 0–44)
AST: 13 U/L — ABNORMAL LOW (ref 15–41)
Albumin: 3.2 g/dL — ABNORMAL LOW (ref 3.5–5.0)
Alkaline Phosphatase: 96 U/L (ref 38–126)
Anion gap: 7 (ref 5–15)
BUN: 16 mg/dL (ref 8–23)
CO2: 27 mmol/L (ref 22–32)
Calcium: 9.2 mg/dL (ref 8.9–10.3)
Chloride: 106 mmol/L (ref 98–111)
Creatinine, Ser: 1.2 mg/dL (ref 0.61–1.24)
GFR, Estimated: >60 mL/min (ref >60)
Glucose, Bld: 96 mg/dL (ref 70–99)
Potassium: 4.9 mmol/L (ref 3.5–5.1)
Sodium: 140 mmol/L (ref 135–145)
Total Bilirubin: 0.5 mg/dL (ref 0.0–1.2)
Total Protein: 5.7 g/dL — ABNORMAL LOW (ref 6.5–8.1)

## 2024-11-13 LAB — PROTIME-INR
INR: 1.2 (ref 0.8–1.2)
Prothrombin Time: 15.6 s — ABNORMAL HIGH (ref 11.4–15.2)

## 2024-11-13 MED ORDER — FENTANYL CITRATE (PF) 100 MCG/2ML IJ SOLN
INTRAMUSCULAR | Status: AC | PRN
  Administered 2024-11-13: 50 ug via INTRAVENOUS

## 2024-11-13 MED ORDER — IOHEXOL 300 MG/ML  SOLN
100.0000 mL | Freq: Once | INTRAMUSCULAR | Status: AC | PRN
  Administered 2024-11-13: 70 mL via INTRA_ARTERIAL

## 2024-11-13 MED ORDER — METHOCARBAMOL 1000 MG/10ML IJ SOLN
500.0000 mg | Freq: Once | INTRAMUSCULAR | Status: AC
  Administered 2024-11-13: 500 mg via INTRAVENOUS
  Filled 2024-11-13: qty 5

## 2024-11-13 MED ORDER — MIDAZOLAM HCL (PF) 2 MG/2ML IJ SOLN
INTRAMUSCULAR | Status: AC | PRN
  Administered 2024-11-13: 1 mg via INTRAVENOUS

## 2024-11-13 MED ORDER — NITROGLYCERIN IN D5W 100-5 MCG/ML-% IV SOLN
INTRAVENOUS | Status: AC
  Filled 2024-11-13: qty 250

## 2024-11-13 MED ORDER — VERAPAMIL HCL 2.5 MG/ML IV SOLN
INTRA_ARTERIAL | Status: AC | PRN
  Administered 2024-11-13: 6 mL via INTRA_ARTERIAL

## 2024-11-13 MED ORDER — ONDANSETRON HCL 4 MG/2ML IJ SOLN: 4.0000 mg | Freq: Four times a day (QID) | INTRAMUSCULAR | Status: DC | PRN

## 2024-11-13 MED ORDER — IOHEXOL 300 MG/ML  SOLN
100.0000 mL | Freq: Once | INTRAMUSCULAR | Status: AC | PRN
  Administered 2024-11-13: 30 mL via INTRA_ARTERIAL

## 2024-11-13 MED ORDER — SODIUM CHLORIDE 0.9 % IV SOLN
2.0000 g | Freq: Once | INTRAVENOUS | Status: AC
  Administered 2024-11-13: 2 g via INTRAVENOUS

## 2024-11-13 MED ORDER — KETOROLAC TROMETHAMINE 30 MG/ML IJ SOLN
INTRAMUSCULAR | Status: AC
  Filled 2024-11-13: qty 1

## 2024-11-13 MED ORDER — MIDAZOLAM HCL 2 MG/2ML IJ SOLN
INTRAMUSCULAR | Status: AC
  Filled 2024-11-13: qty 2

## 2024-11-13 MED ORDER — FENTANYL CITRATE (PF) 100 MCG/2ML IJ SOLN
INTRAMUSCULAR | Status: AC
  Filled 2024-11-13: qty 2

## 2024-11-13 MED ORDER — KETOROLAC TROMETHAMINE 30 MG/ML IJ SOLN
INTRAMUSCULAR | Status: AC | PRN
  Administered 2024-11-13: 15 mg via INTRAVENOUS

## 2024-11-13 MED ORDER — DIPHENHYDRAMINE HCL 50 MG/ML IJ SOLN
INTRAMUSCULAR | Status: AC
  Filled 2024-11-13: qty 1

## 2024-11-13 MED ORDER — LIDOCAINE HCL (PF) 1 % IJ SOLN
INTRAMUSCULAR | Status: AC
Start: 2024-11-13 — End: 2024-11-13
  Filled 2024-11-13: qty 30

## 2024-11-13 MED ORDER — VERAPAMIL HCL 2.5 MG/ML IV SOLN
INTRAVENOUS | Status: AC
Start: 2024-11-13 — End: 2024-11-13
  Filled 2024-11-13: qty 2

## 2024-11-13 MED ORDER — HEPARIN SODIUM (PORCINE) 1000 UNIT/ML IJ SOLN
INTRAMUSCULAR | Status: AC
  Filled 2024-11-13: qty 10

## 2024-11-13 MED ORDER — SODIUM CHLORIDE 0.9% FLUSH: 3.0000 mL | INTRAVENOUS | Status: DC | PRN

## 2024-11-13 MED ORDER — DIPHENHYDRAMINE HCL 50 MG/ML IJ SOLN
INTRAMUSCULAR | Status: AC | PRN
  Administered 2024-11-13: 50 mg via INTRAVENOUS

## 2024-11-13 MED ORDER — IOHEXOL 300 MG/ML  SOLN
100.0000 mL | Freq: Once | INTRAMUSCULAR | Status: AC | PRN
  Administered 2024-11-13: 20 mL via INTRA_ARTERIAL

## 2024-11-13 MED ORDER — LIDOCAINE HCL (PF) 1 % IJ SOLN
30.0000 mL | Freq: Once | INTRAMUSCULAR | Status: AC
  Administered 2024-11-13: 3 mL via INTRADERMAL

## 2024-11-13 MED ORDER — SODIUM CHLORIDE 0.9 % IV SOLN
INTRAVENOUS | Status: AC
  Filled 2024-11-13: qty 20

## 2024-11-13 MED ORDER — IOHEXOL 300 MG/ML  SOLN
50.0000 mL | Freq: Once | INTRAMUSCULAR | Status: AC | PRN
  Administered 2024-11-13: 4 mL via INTRA_ARTERIAL

## 2024-11-13 NOTE — Progress Notes (Signed)
 Mobility Specialist - Progress Note   11/13/24 0945  Mobility  Activity Ambulated with assistance  Level of Assistance Modified independent, requires aide device or extra time  Assistive Device Front wheel walker  Distance Ambulated (ft) 500 ft  Range of Motion/Exercises Active  Activity Response Tolerated well  Mobility visit 1 Mobility  Mobility Specialist Start Time (ACUTE ONLY) 0930  Mobility Specialist Stop Time (ACUTE ONLY) 0945  Mobility Specialist Time Calculation (min) (ACUTE ONLY) 15 min   Pt was found in bed and agreeable to mobilize. C/o bladder spasm towards EOS. At EOS was left in bed with all needs met. Call bell in reach.   Erminio Leos,  Mobility Specialist Can be reached via Secure Chat

## 2024-11-13 NOTE — Plan of Care (Signed)

## 2024-11-13 NOTE — Procedures (Signed)
 Vascular and Interventional Radiology Procedure Note  Patient: Travis Woodward. DOB: 04/25/1941 Medical Record Number: [id number] Note Date/Time: 11/13/24 1:57 PM   Performing Physician: Thom Hall, MD Assistant(s): None  Diagnosis: BPH w LUTS . Hematuria on AC  Procedure(s):  PELVIC ARTERIOGRAPHY RIGHT PROSTATE ARTERY EMBOLIZATION   Anesthesia: Conscious Sedation Complications: None Estimated Blood Loss: Minimal Specimens: None  Findings:  - access via the LEFT radial artery. - tortuous prostatic arteries.  - Diminutive and with challenging take-off at L prostatic artery. Embolization was not performed on this side. - Successful embolization of the larger, dominant R prostatic artery with 100-300 um microparticles to near stasis, with additional coil embolization of the prostatic artery origin - TR band closure at L wrist at the end of the case  Plan: - Post sheath removal precautions.  - Standard post radial access deflation protocol.   Final report to follow once all images are reviewed and compared with previous studies.  See detailed dictation with images in PACS. The patient tolerated the procedure well without incident or complication and was returned to Recovery in stable condition.    Thom Hall, MD Vascular and Interventional Radiology Specialists [facility] Radiology   Pager. [phone] Clinic. [phone]

## 2024-11-13 NOTE — Progress Notes (Signed)
 PROGRESS NOTE    Travis Woodward.  FMW:[id number] DOB: 02-20-1941 DOA: 11/08/2024 PCP: Orpha Yancey LABOR, MD   Brief Narrative: 83 year old with past medical history significant for hypertension, CKD stage IIIa, BPH presents to the ED with hematuria.  Of note patient was recently admitted at [facility] facility for management of hematuria.  He was on Eliquis  which was held.  Patient underwent cystoscopy with clot evacuation and fulguration by Dr. Guerron  on 10/29/2024.  Urology consulted and recommended placing a three-way Foley catheter, patient was transferred [name] for further evaluation.  He was still taking Eliquis .   Assessment & Plan:   Active Problems:   Hematuria   Hematuria of unknown cause   1-Hematuria:  UTI--related to chronic catheter  BPH LUTS Recurrent hematuria aggravated by anticoagulation, Eliquis , aspirin  Three-way Foley catheter in place. Continue to monitor hemoglobin Appreciate Urology evaluation  Urine culture grew: More than 100 K colonies of Morganella morganii sensitive to ciprofloxacin . On   ciprofloxacin  IR consulted for evaluation of PAE. Plan for procedure today   Acute blood loss anemia: In the setting of hematuria -Hemoglobin baseline 10 -Continue with  iron supplement  Monitor. Hb stable.   Hypertension: - Hydralazine  as needed  BPH: - Continue Flomax   CKD 3A: -Creatinine baseline 1.2----1.4 - Presented  with a creatinine of 1.5. - Renal function stable creatinine down to 1.3  Thrombocytopenia: Suspect related to acute blood loss  History of DVT S/p penumbra thrombectomy and right common iliac vein stenting.  History of CAD status post stent to the LAD On baby aspirin , currently on hold  Chronic pain: Continue oxycodone  15 mg 3 times daily  Constipation: Continue with  Miralax , senna See wound care documentation below pressure injury buttocks left deep tissue present on admission. Wound 11/08/24 1109 Pressure Injury  Buttocks Left Deep Tissue Pressure Injury - Purple or maroon localized area of discolored intact skin or blood-filled blister due to damage of underlying soft tissue from pressure and/or shear. (Active)          Estimated body mass index is 19.79 kg/m as calculated from the following:   Height as of this encounter: 6' 2 (1.88 m).   Weight as of this encounter: 69.9 kg.   DVT prophylaxis: SCD Code Status: Full code Family Communication: Disposition Plan:  Status is: Inpatient Remains inpatient appropriate because: management of hematuria, acute blood loss.     Consultants:  Urology   Procedures:  Foley placement.   Antimicrobials:    Subjective: Doing well, report pain foley, penes.  Urine clear. Awaiting procedure.  Objective: Vitals:   11/13/24 0104 11/13/24 0200 11/13/24 0223 11/13/24 0350  BP:    (!) 109/53  Pulse:    72  Resp: 10 12 12 14   Temp:    99.1 F (37.3 C)  TempSrc:    Oral  SpO2:    97%  Weight:      Height:        Intake/Output Summary (Last 24 hours) at 11/13/2024 0734 Last data filed at 11/13/2024 0300 Gross per 24 hour  Intake 862.13 ml  Output 9000 ml  Net -8137.87 ml   Filed Weights   11/08/24 0224  Weight: 69.9 kg    Examination:  General exam: NAD Respiratory system: CTA Cardiovascular system: S 1, S 2 RRR Gastrointestinal system: BS present, soft, nt Central nervous system: Alert Extremities: No edema    Data Reviewed: I have personally reviewed following labs and imaging studies  CBC:  Recent Labs  Lab 11/08/24 0228 11/08/24 0454 11/09/24 0549 11/10/24 0554 11/11/24 0805 11/12/24 0930 11/13/24 0526  WBC 9.5  --  7.1 6.7 6.8 13.7* 9.9  NEUTROABS 6.5  --   --   --   --   --   --   HGB 10.4*   < > 8.6* 8.6* 10.1* 10.5* 9.3*  HCT 32.6*   < > 27.4* 27.3* 31.7* 33.7* 29.2*  MCV 96.2  --  95.8 96.8 96.4 97.4 97.0  PLT 148*  --  113* 109* 118* 130* 106*   < > = values in this interval not displayed.   Basic  Metabolic Panel: Recent Labs  Lab 11/09/24 0549 11/10/24 0554 11/11/24 0805 11/12/24 0930 11/13/24 0526  NA 139 140 138 141 140  K 4.5 4.9 4.1 3.8 4.9  CL 108 109 105 105 106  CO2 24 24 27 24 27   GLUCOSE 87 88 105* 125* 96  BUN 16 18 17 18 16   CREATININE 1.38* 1.41* 1.36* 1.42* 1.20  CALCIUM 9.0 9.1 9.4 9.6 9.2   GFR: Estimated Creatinine Clearance: 46.1 mL/min (by C-G formula based on SCr of 1.2 mg/dL). Liver Function Tests: Recent Labs  Lab 11/08/24 0228 11/13/24 0526  AST 13* 13*  ALT 6 <5  ALKPHOS 114 96  BILITOT 0.3 0.5  PROT 6.9 5.7*  ALBUMIN 4.1 3.2*   No results for input(s): LIPASE, AMYLASE in the last 168 hours. No results for input(s): AMMONIA in the last 168 hours. Coagulation Profile: Recent Labs  Lab 11/08/24 0228 11/13/24 0526  INR 1.1 1.2   Cardiac Enzymes: No results for input(s): CKTOTAL, CKMB, CKMBINDEX, TROPONINI in the last 168 hours. BNP (last 3 results) No results for input(s): PROBNP in the last 8760 hours. HbA1C: No results for input(s): HGBA1C in the last 72 hours. CBG: No results for input(s): GLUCAP in the last 168 hours. Lipid Profile: No results for input(s): CHOL, HDL, LDLCALC, TRIG, CHOLHDL, LDLDIRECT in the last 72 hours. Thyroid  Function Tests: No results for input(s): TSH, T4TOTAL, FREET4, T3FREE, THYROIDAB in the last 72 hours. Anemia Panel: No results for input(s): VITAMINB12, FOLATE, FERRITIN, TIBC, IRON, RETICCTPCT in the last 72 hours. Sepsis Labs: No results for input(s): PROCALCITON, LATICACIDVEN in the last 168 hours.  No results found for this or any previous visit (from the past 240 hours).        Radiology Studies: No results found.        Scheduled Meds:  Chlorhexidine  Gluconate Cloth  6 each Topical Daily   ciprofloxacin   500 mg Oral Q12H   ferrous sulfate   325 mg Oral Q breakfast   finasteride   5 mg Oral Daily   folic acid   1 mg Oral  Daily   liver oil-zinc oxide   Topical BID   polyethylene glycol  17 g Oral BID   senna-docusate  1 tablet Oral BID   tamsulosin   0.4 mg Oral Daily   Continuous Infusions:  lactated ringers  75 mL/hr at 11/13/24 0727   sodium chloride  irrigation       LOS: 5 days    Time spent: 35 Minutes    Abbiegail Landgren A Giara Mcgaughey, MD [facility]   If 7PM-7AM, please contact night-coverage [email or web address]  11/13/2024, 7:34 AM

## 2024-11-13 NOTE — Progress Notes (Signed)
 Removed Air Band per orders. No bleeding no  complications. See Vital Sign sheets.

## 2024-11-14 ENCOUNTER — Ambulatory Visit

## 2024-11-14 DIAGNOSIS — R310 Gross hematuria: Secondary | ICD-10-CM | POA: Diagnosis not present

## 2024-11-14 LAB — CBC
HCT: 30.1 % — ABNORMAL LOW (ref 39.0–52.0)
Hemoglobin: 9.6 g/dL — ABNORMAL LOW (ref 13.0–17.0)
MCH: 31 pg (ref 26.0–34.0)
MCHC: 31.9 g/dL (ref 30.0–36.0)
MCV: 97.1 fL (ref 80.0–100.0)
Platelets: 103 K/uL — ABNORMAL LOW (ref 150–400)
RBC: 3.1 MIL/uL — ABNORMAL LOW (ref 4.22–5.81)
RDW: 16.3 % — ABNORMAL HIGH (ref 11.5–15.5)
WBC: 9.4 K/uL (ref 4.0–10.5)
nRBC: 0 % (ref 0.0–0.2)

## 2024-11-14 LAB — BASIC METABOLIC PANEL WITH GFR
Anion gap: 10 (ref 5–15)
BUN: 17 mg/dL (ref 8–23)
CO2: 25 mmol/L (ref 22–32)
Calcium: 9.4 mg/dL (ref 8.9–10.3)
Chloride: 104 mmol/L (ref 98–111)
Creatinine, Ser: 1.4 mg/dL — ABNORMAL HIGH (ref 0.61–1.24)
GFR, Estimated: 50 mL/min — ABNORMAL LOW (ref >60)
Glucose, Bld: 140 mg/dL — ABNORMAL HIGH (ref 70–99)
Potassium: 4.4 mmol/L (ref 3.5–5.1)
Sodium: 139 mmol/L (ref 135–145)

## 2024-11-14 MED ORDER — HYOSCYAMINE SULFATE 0.125 MG PO TBDP
0.1250 mg | ORAL_TABLET | ORAL | Status: DC | PRN
  Administered 2024-11-14 – 2024-11-17 (×9): 0.125 mg via ORAL
  Filled 2024-11-14 (×17): qty 1

## 2024-11-14 NOTE — Plan of Care (Signed)
   Problem: Education: Goal: Knowledge of General Education information will improve Description: Including pain rating scale, medication(s)/side effects and non-pharmacologic comfort measures Outcome: Progressing   Problem: Safety: Goal: Ability to remain free from injury will improve Outcome: Progressing   Problem: Skin Integrity: Goal: Risk for impaired skin integrity will decrease Outcome: Progressing

## 2024-11-14 NOTE — Progress Notes (Signed)
 Mobility Specialist Progress Note:   11/14/24 1015  Mobility  Activity Ambulated with assistance  Level of Assistance Contact guard assist, steadying assist  Assistive Device Front wheel walker  Distance Ambulated (ft) 250 ft  Activity Response Tolerated well  Mobility Referral Yes  Mobility visit 1 Mobility  Mobility Specialist Start Time (ACUTE ONLY) [id number]  Mobility Specialist Stop Time (ACUTE ONLY) 0950  Mobility Specialist Time Calculation (min) (ACUTE ONLY) 12 min   Pt was received in bed and agreed to mobility. 1x small standing break during ambulation. Returned to bed with all needs met. Call bell in reach.  [employer] - Mobility Specialist

## 2024-11-14 NOTE — Progress Notes (Signed)
 Referring Physician(s): [name],T  Supervising Physician: Philip Cornet  Patient Status:  [name] - In-pt  Chief Complaint: Recurrent hematuria, severe prostatomegaly   Subjective: Pt s/p right prostatic artery embolization yesterday.  Embolization not performed on left side due to diminutive arteries.  Patient with bladder spasms earlier today but since improved with adjustment of Foley catheter by urology; light yellow urine in Foley at this time.   Allergies: Crestor [rosuvastatin] and Paroxetine  Medications: Prior to Admission medications   Medication Sig Start Date End Date Taking? Authorizing Provider  apixaban  (ELIQUIS ) 5 MG TABS tablet Take 1 tablet (5 mg total) by mouth 2 (two) times daily. 10/31/24  Yes Sheikh, Omair Latif, DO  aspirin  EC 81 MG tablet Take 81 mg by mouth daily.   Yes [provider]  Cholecalciferol  (VITAMIN D  HIGH POTENCY PO) Take 5,000 Units by mouth daily.   Yes [provider]  docusate sodium  (COLACE) 100 MG capsule Take 1 capsule (100 mg total) by mouth 2 (two) times daily. Patient taking differently: Take 100 mg by mouth at bedtime. 08/26/23  Yes Drusilla Sabas RAMAN, MD  finasteride  (PROSCAR ) 5 MG tablet Take 1 tablet (5 mg total) by mouth daily. 10/30/24  Yes Nieves Cough, MD  nitroGLYCERIN  (NITROSTAT ) 0.4 MG SL tablet Place 0.4 mg under the tongue every 5 (five) minutes as needed for chest pain. 06/23/17  Yes [provider]  ondansetron  (ZOFRAN ) 4 MG tablet Take 1 tablet (4 mg total) by mouth every 6 (six) hours as needed for nausea. 10/31/24  Yes Sheikh, Omair Latif, DO  oxyCODONE  (ROXICODONE ) 15 MG immediate release tablet Take 15 mg by mouth every 8 (eight) hours as needed for pain. 10/03/24  Yes [provider]  polyethylene glycol (MIRALAX  / GLYCOLAX ) 17 g packet Take 17 g by mouth daily. Patient taking differently: Take 17 g by mouth at bedtime. 08/27/23  Yes Drusilla Sabas RAMAN, MD  sulfamethoxazole -trimethoprim  (BACTRIM   DS) 800-160 MG tablet Take 1 tablet by mouth 2 (two) times daily. 11/05/24  Yes [provider]  tamsulosin  (FLOMAX ) 0.4 MG CAPS capsule Take 1 capsule (0.4 mg total) by mouth daily. 10/30/24  Yes Nieves Cough, MD  cephALEXin (KEFLEX) 500 MG capsule Take 1 capsule (500 mg total) by mouth 4 (four) times daily. Patient not taking: Reported on 11/08/2024 11/02/24   Robinson, John K, PA-C  Cyanocobalamin (VITAMIN B-12 IJ) Inject 1 mL as directed every 30 (thirty) days.     [provider]     Vital Signs: BP (!) 100/53 (BP Location: Right Arm)   Pulse 71   Temp 98.2 F (36.8 C) (Oral)   Resp 18   Ht 6' 2 (1.88 m)   Wt 154 lb 1.6 oz (69.9 kg)   SpO2 99%   BMI 19.79 kg/m   Physical Exam: Patient awake, alert.  Access site left radial artery with some mild bruising, nontender, 2+ radial pulse.  Light yellow urine in Foley catheter.     Imaging: CT ANGIO PELVIS W OR WO CONTRAST Result Date: 11/11/2024 CLINICAL DATA:  pre op eval for possible PAE EXAM: CT ANGIOGRAPHY PELVIS WITH CONTRAST TECHNIQUE: Multidetector CT imaging of the pelvis was performed using the standard protocol following the bolus administration of intravenous contrast. Multiplanar image (3D post-processing) reconstructions and MIPs were obtained to evaluate the pelvic vascular anatomy. RADIATION DOSE REDUCTION: This exam was performed according to the departmental dose-optimization program which includes automated exposure control, adjustment of the mA and/or kV according to  patient size and/or use of iterative reconstruction technique. CONTRAST:  OMNIPAQUE  IOHEXOL  350 MG/ML SOLN COMPARISON:  CT AP, 11/08/2024 and 10/28/2024. FINDINGS: VASCULAR; Pelvis: *Mild burden of distal aortic bifurcation and pelvic arterial atherosclerosis without aneurysmal dilatation *Widely patent aortic bifurcation, hypogastric, external iliac and common femoral arteries. *Internal iliac artery branching with division into the  superior gluteal artery and gluteal pudendal trunk bilaterally (Yamaki group A). *Patent BILATERAL prostatic arteries, both seemingly arising from the anterior division of the internal iliac artery/gluteal pudendal trunks (type II). *RIGHT common iliac vein stent, with patency not evaluated given arterial phase study. NONVASCULAR; Urinary Tract: Foley catheter and small focus of non dependent intraluminal gas within the urinary bladder. Small volume thrombus along the LEFT posterior urinary bladder wall versus ureteral jet. No active extravasation. No distal ureteral or urinary bladder distention Bowel: Mild rectal distention with stool, correlate for potential impaction. Imaged portions of distal small bowel and colon are nondilated. Lymphatic: No enlarged abdominal or pelvic lymph nodes. Reproductive: Prostatomegaly, measuring approximately 6.0 x 6.5 x 7.0 cm, with a calculated volume of 137 mL Other:  No pelvic ascites. Musculoskeletal: LEFT hip arthroplasty.  No suspicious bone lesion. IMPRESSION: 1. Small volume urinary bladder thrombus versus ureteral jet. No active contrast extravasation. 2. Marked prostatomegaly, measuring approximately 137 g in volume. 3. BILATERAL internal iliac artery branching into the superior gluteal artery and gluteal pudendal trunk (group A) 4. Patent BILATERAL prostatic arteries, seemingly arising from the gluteal pudendal trunks (type II). 5. Mild rectal distention with stool. Correlate for potential impaction. Additional incidental, chronic and senescent findings as above. Electronically Signed   By: Thom Hall M.D.   On: 11/11/2024 07:51    Labs:  CBC: Recent Labs    11/11/24 0805 11/12/24 0930 11/13/24 0526 11/14/24 0853  WBC 6.8 13.7* 9.9 9.4  HGB 10.1* 10.5* 9.3* 9.6*  HCT 31.7* 33.7* 29.2* 30.1*  PLT 118* 130* 106* 103*    COAGS: Recent Labs    10/28/24 1533 10/29/24 0513 11/08/24 0228 11/13/24 0526  INR 1.2 1.2 1.1 1.2  APTT  --  33  --   --      BMP: Recent Labs    11/11/24 0805 11/12/24 0930 11/13/24 0526 11/14/24 0853  NA 138 141 140 139  K 4.1 3.8 4.9 4.4  CL 105 105 106 104  CO2 27 24 27 25   GLUCOSE 105* 125* 96 140*  BUN 17 18 16 17   CALCIUM 9.4 9.6 9.2 9.4  CREATININE 1.36* 1.42* 1.20 1.40*  GFRNONAA 52* 49* >60 50*    LIVER FUNCTION TESTS: Recent Labs    10/30/24 0445 10/31/24 0916 11/08/24 0228 11/13/24 0526  BILITOT 0.5 0.5 0.3 0.5  AST 11* 12* 13* 13*  ALT 6 <5 6 <5  ALKPHOS 104 106 114 96  PROT 6.1* 6.1* 6.9 5.7*  ALBUMIN 3.8 3.6 4.1 3.2*    Assessment and Plan: Patient with history of severe prostatomegaly and recent gross hematuria; status post right prostatic artery embolization yesterday.  Left prostatic artery embolization was not performed due to diminutive size ; afebrile, hemoglobin 9.6 up from 9.3, creatinine 1.4; currently without any signs of active bleeding; Foley catheter was displaced into the prostate earlier this morning and adjusted by urology.  Patient currently stable.  Further plans as outlined by urology.   Electronically Signed: D. Franky Rakers, PA-C 11/14/2024, 1:42 PM   I spent a total of 15 Minutes at the the patient's bedside AND on the patient's hospital floor  or unit, greater than 50% of which was counseling/coordinating care for right prostatic artery embolization    Patient ID: Travis Woodward., male   DOB: August 13, 1941, 83 y.o.   MRN: [id number]

## 2024-11-14 NOTE — Plan of Care (Signed)

## 2024-11-14 NOTE — Progress Notes (Signed)
 PROGRESS NOTE    Travis Woodward.  FMW:[id number] DOB: 1941-02-08 DOA: 11/08/2024 PCP: Orpha Yancey LABOR, MD   Brief Narrative: 83 year old with past medical history significant for hypertension, CKD stage IIIa, BPH presents to the ED with hematuria.  Of note patient was recently admitted at [facility] facility for management of hematuria.  He was on Eliquis  which was held.  Patient underwent cystoscopy with clot evacuation and fulguration by Dr. Guerron  on 10/29/2024.  Urology consulted and recommended placing a three-way Foley catheter, patient was transferred to [name] for further evaluation.  He was still taking Eliquis .   Assessment & Plan:   Active Problems:   Hematuria   Hematuria of unknown cause   1-Hematuria:  UTI--related to chronic catheter  BPH LUTS -Recurrent hematuria aggravated by anticoagulation, Eliquis , aspirin  -Three-way Foley catheter in place. -Continue to monitor hemoglobin -Appreciate Urology evaluation  -Urine culture grew: More than 100 K colonies of Morganella morganii sensitive to ciprofloxacin . On   ciprofloxacin  -Patient underwent PAE on 11/17.  -Foley catheter was displaced, causing a lot pain , discomfort and hematuria this am. Plan to continue CBI today. Urology planning voiding trial tomorrow and patient may be discharge tomorrow.  -Might be able to resume anticoagulation tomorrow.  -On finasteride , and flomax .   Acute blood loss anemia: In the setting of hematuria -Hemoglobin baseline 10 -Continue with  iron supplement  - Hb stable.   Hypertension: - Hydralazine  as needed  BPH: - Continue Flomax   CKD 3A: -Creatinine baseline 1.2----1.4 - Presented  with a creatinine of 1.5. - Renal function continue to be stable  Thrombocytopenia: Suspect related to acute blood loss  History of DVT S/p penumbra thrombectomy and right common iliac vein stenting.  History of CAD status post stent to the LAD On baby aspirin , currently on  hold  Chronic pain: Continue oxycodone  15 mg 3 times daily  Constipation: Continue with  Miralax , senna See wound care documentation below pressure injury buttocks left deep tissue present on admission. Wound 11/08/24 1109 Pressure Injury Buttocks Left Deep Tissue Pressure Injury - Purple or maroon localized area of discolored intact skin or blood-filled blister due to damage of underlying soft tissue from pressure and/or shear. (Active)          Estimated body mass index is 19.79 kg/m as calculated from the following:   Height as of this encounter: 6' 2 (1.88 m).   Weight as of this encounter: 69.9 kg.   DVT prophylaxis: SCD Code Status: Full code Family Communication: Disposition Plan:  Status is: Inpatient Remains inpatient appropriate because: management of hematuria, acute blood loss.     Consultants:  Urology   Procedures:  Foley placement.   Antimicrobials:    Subjective: He was having a lot of pain this morning, catheter was displaced, he developed hematuria.  He is feeling a little bit better after catheter was adjusted.  Objective: Vitals:   11/13/24 2015 11/14/24 0038 11/14/24 0503 11/14/24 1353  BP: (!) 100/49 (!) 105/47 (!) 100/53 (!) 124/59  Pulse: 66 71  83  Resp: 14 18 18 20   Temp: (!) 97.3 F (36.3 C) 97.9 F (36.6 C) 98.2 F (36.8 C) 98.2 F (36.8 C)  TempSrc: Oral Oral Oral Oral  SpO2: 95% 98% 99% 98%  Weight:      Height:        Intake/Output Summary (Last 24 hours) at 11/14/2024 1407 Last data filed at 11/14/2024 1357 Gross per 24 hour  Intake 250 ml  Output 1150 ml  Net -900 ml   Filed Weights   11/08/24 0224  Weight: 69.9 kg    Examination:  General exam: NAD Respiratory system; CTA Cardiovascular system: S 1. S 2 RRR Gastrointestinal system: BS present, soft, nt Central nervous system: Alert Extremities: No edema    Data Reviewed: I have personally reviewed following labs and imaging studies  CBC: Recent  Labs  Lab 11/08/24 0228 11/08/24 0454 11/10/24 0554 11/11/24 0805 11/12/24 0930 11/13/24 0526 11/14/24 0853  WBC 9.5   < > 6.7 6.8 13.7* 9.9 9.4  NEUTROABS 6.5  --   --   --   --   --   --   HGB 10.4*   < > 8.6* 10.1* 10.5* 9.3* 9.6*  HCT 32.6*   < > 27.3* 31.7* 33.7* 29.2* 30.1*  MCV 96.2   < > 96.8 96.4 97.4 97.0 97.1  PLT 148*   < > 109* 118* 130* 106* 103*   < > = values in this interval not displayed.   Basic Metabolic Panel: Recent Labs  Lab 11/10/24 0554 11/11/24 0805 11/12/24 0930 11/13/24 0526 11/14/24 0853  NA 140 138 141 140 139  K 4.9 4.1 3.8 4.9 4.4  CL 109 105 105 106 104  CO2 24 27 24 27 25   GLUCOSE 88 105* 125* 96 140*  BUN 18 17 18 16 17   CREATININE 1.41* 1.36* 1.42* 1.20 1.40*  CALCIUM 9.1 9.4 9.6 9.2 9.4   GFR: Estimated Creatinine Clearance: 39.5 mL/min (A) (by C-G formula based on SCr of 1.4 mg/dL (H)). Liver Function Tests: Recent Labs  Lab 11/08/24 0228 11/13/24 0526  AST 13* 13*  ALT 6 <5  ALKPHOS 114 96  BILITOT 0.3 0.5  PROT 6.9 5.7*  ALBUMIN 4.1 3.2*   No results for input(s): LIPASE, AMYLASE in the last 168 hours. No results for input(s): AMMONIA in the last 168 hours. Coagulation Profile: Recent Labs  Lab 11/08/24 0228 11/13/24 0526  INR 1.1 1.2   Cardiac Enzymes: No results for input(s): CKTOTAL, CKMB, CKMBINDEX, TROPONINI in the last 168 hours. BNP (last 3 results) No results for input(s): PROBNP in the last 8760 hours. HbA1C: No results for input(s): HGBA1C in the last 72 hours. CBG: No results for input(s): GLUCAP in the last 168 hours. Lipid Profile: No results for input(s): CHOL, HDL, LDLCALC, TRIG, CHOLHDL, LDLDIRECT in the last 72 hours. Thyroid  Function Tests: No results for input(s): TSH, T4TOTAL, FREET4, T3FREE, THYROIDAB in the last 72 hours. Anemia Panel: No results for input(s): VITAMINB12, FOLATE, FERRITIN, TIBC, IRON, RETICCTPCT in the last 72  hours. Sepsis Labs: No results for input(s): PROCALCITON, LATICACIDVEN in the last 168 hours.  No results found for this or any previous visit (from the past 240 hours).        Radiology Studies: No results found.        Scheduled Meds:  Chlorhexidine  Gluconate Cloth  6 each Topical Daily   ciprofloxacin   500 mg Oral Q12H   ferrous sulfate   325 mg Oral Q breakfast   finasteride   5 mg Oral Daily   folic acid   1 mg Oral Daily   liver oil-zinc oxide   Topical BID   polyethylene glycol  17 g Oral BID   senna-docusate  1 tablet Oral BID   tamsulosin   0.4 mg Oral Daily   Continuous Infusions:  sodium chloride  irrigation       LOS: 6 days    Time spent: 35 Minutes  Owen DELENA Lore, MD [facility]   If 7PM-7AM, please contact night-coverage [email or web address]  11/14/2024, 2:07 PM

## 2024-11-14 NOTE — Progress Notes (Signed)
     Subjective: Travis Woodward was in some distress this morning, struggling with with severe bladder spasm since last night.  CBI on very low gtt.  Clear yellow urine.  Catheter appeared displaced  Objective: Vital signs in last 24 hours: Temp:  [97.3 F (36.3 C)-98.2 F (36.8 C)] 98.2 F (36.8 C) (11/18 0503) Pulse Rate:  [60-100] 71 (11/18 0038) Resp:  [9-21] 18 (11/18 0503) BP: (86-150)/(46-112) 100/53 (11/18 0503) SpO2:  [90 %-100 %] 99 % (11/18 0503)  Assessment/Plan: # Severe prostatomegaly # Gross hematuria- resolved  Trend labs.  No sign of active bleeding.  Small interval increase in hemoglobin overnight.  S/p PAE on 11/13/2024.  Technically challenging case and only right prostatic artery was able to be embolized.    Continue finasteride  and Flomax .  Could consider Firmagon if severe bleeding recurs.  Foley catheter was displaced into the prostate with only 7 mL in the balloon.  Patient's pain was coming from this, in addition to CBI overfilling his bladder.  Took down balloon, lubricated catheter and advanced appropriately into the bladder with immediate return of lightly blood-tinged irrigant.  Was able to flush catheter quantitatively and quantitatively.  Refilled balloon with 30 mL of sterile water .  Patient tolerating well at this time.  CBI restarted at low gtt.  Antispasmodics provided.  If clear tomorrow, okay to trial anticoagulation and discharge home  Intake/Output from previous day: 11/17 0701 - 11/18 0700 In: 10 [IV Piggyback:10] Out: 350 [Urine:350]  Intake/Output this shift: Total I/O In: 240 [P.O.:240] Out: -   Physical Exam:  General: Alert and oriented CV: No cyanosis Lungs: equal chest rise Abdomen: Soft, NTND, no rebound or guarding Gu: Three-way Foley catheter in place draining lightly blood-tinged irrigant.  CBI clamped on rounds.  Lab Results: Recent Labs    11/12/24 0930 11/13/24 0526 11/14/24 0853  HGB 10.5* 9.3* 9.6*  HCT 33.7*  29.2* 30.1*   BMET Recent Labs    11/13/24 0526 11/14/24 0853  NA 140 139  K 4.9 4.4  CL 106 104  CO2 27 25  GLUCOSE 96 140*  BUN 16 17  CREATININE 1.20 1.40*  CALCIUM 9.2 9.4  HGB 9.3* 9.6*  WBC 9.9 9.4     Studies/Results: No results found.     LOS: 6 days   Travis Bourdon, NP [facility] Pager: [phone]  11/14/2024, 11:26 AM

## 2024-11-15 DIAGNOSIS — I10 Essential (primary) hypertension: Secondary | ICD-10-CM

## 2024-11-15 DIAGNOSIS — D696 Thrombocytopenia, unspecified: Secondary | ICD-10-CM

## 2024-11-15 DIAGNOSIS — D649 Anemia, unspecified: Secondary | ICD-10-CM

## 2024-11-15 DIAGNOSIS — I2510 Atherosclerotic heart disease of native coronary artery without angina pectoris: Secondary | ICD-10-CM

## 2024-11-15 DIAGNOSIS — E782 Mixed hyperlipidemia: Secondary | ICD-10-CM

## 2024-11-15 DIAGNOSIS — G8929 Other chronic pain: Secondary | ICD-10-CM

## 2024-11-15 DIAGNOSIS — Z86718 Personal history of other venous thrombosis and embolism: Secondary | ICD-10-CM

## 2024-11-15 DIAGNOSIS — N1831 Chronic kidney disease, stage 3a: Secondary | ICD-10-CM

## 2024-11-15 DIAGNOSIS — K5900 Constipation, unspecified: Secondary | ICD-10-CM

## 2024-11-15 DIAGNOSIS — Z9861 Coronary angioplasty status: Secondary | ICD-10-CM

## 2024-11-15 DIAGNOSIS — N401 Enlarged prostate with lower urinary tract symptoms: Secondary | ICD-10-CM

## 2024-11-15 DIAGNOSIS — R310 Gross hematuria: Secondary | ICD-10-CM | POA: Diagnosis not present

## 2024-11-15 LAB — CBC WITH DIFFERENTIAL/PLATELET
Abs Immature Granulocytes: 0.06 K/uL (ref 0.00–0.07)
Basophils Absolute: 0.1 K/uL (ref 0.0–0.1)
Basophils Relative: 0 %
Eosinophils Absolute: 0.1 K/uL (ref 0.0–0.5)
Eosinophils Relative: 1 %
HCT: 27.4 % — ABNORMAL LOW (ref 39.0–52.0)
Hemoglobin: 8.8 g/dL — ABNORMAL LOW (ref 13.0–17.0)
Immature Granulocytes: 1 %
Lymphocytes Relative: 7 %
Lymphs Abs: 0.9 K/uL (ref 0.7–4.0)
MCH: 30.9 pg (ref 26.0–34.0)
MCHC: 32.1 g/dL (ref 30.0–36.0)
MCV: 96.1 fL (ref 80.0–100.0)
Monocytes Absolute: 1.4 K/uL — ABNORMAL HIGH (ref 0.1–1.0)
Monocytes Relative: 11 %
Neutro Abs: 10.3 K/uL — ABNORMAL HIGH (ref 1.7–7.7)
Neutrophils Relative %: 80 %
Platelets: 98 K/uL — ABNORMAL LOW (ref 150–400)
RBC: 2.85 MIL/uL — ABNORMAL LOW (ref 4.22–5.81)
RDW: 16.2 % — ABNORMAL HIGH (ref 11.5–15.5)
WBC: 12.8 K/uL — ABNORMAL HIGH (ref 4.0–10.5)
nRBC: 0 % (ref 0.0–0.2)

## 2024-11-15 LAB — BASIC METABOLIC PANEL WITH GFR
Anion gap: 9 (ref 5–15)
BUN: 19 mg/dL (ref 8–23)
CO2: 25 mmol/L (ref 22–32)
Calcium: 9.2 mg/dL (ref 8.9–10.3)
Chloride: 103 mmol/L (ref 98–111)
Creatinine, Ser: 1.41 mg/dL — ABNORMAL HIGH (ref 0.61–1.24)
GFR, Estimated: 49 mL/min — ABNORMAL LOW (ref >60)
Glucose, Bld: 132 mg/dL — ABNORMAL HIGH (ref 70–99)
Potassium: 3.9 mmol/L (ref 3.5–5.1)
Sodium: 137 mmol/L (ref 135–145)

## 2024-11-15 LAB — MAGNESIUM: Magnesium: 2.3 mg/dL (ref 1.7–2.4)

## 2024-11-15 MED ORDER — APIXABAN 5 MG PO TABS
5.0000 mg | ORAL_TABLET | Freq: Two times a day (BID) | ORAL | Status: DC
  Administered 2024-11-15 – 2024-11-17 (×5): 5 mg via ORAL
  Filled 2024-11-15 (×5): qty 1

## 2024-11-15 MED ORDER — BISACODYL 10 MG RE SUPP
10.0000 mg | Freq: Every day | RECTAL | Status: DC
  Filled 2024-11-15: qty 1

## 2024-11-15 NOTE — Progress Notes (Signed)
 PROGRESS NOTE    Travis Woodward.  FMW:[id number] DOB: [birth year] DOA: 11/08/2024 PCP: Orpha Yancey LABOR, MD    Chief Complaint  Patient presents with   Hematuria    Brief Narrative:  83 year old with past medical history significant for hypertension, CKD stage IIIa, BPH presents to the ED with hematuria. Of note patient was recently admitted at [facility] facility for management of hematuria. He was on Eliquis  which was held. Patient underwent cystoscopy with clot evacuation and fulguration by Dr. Guerron on 10/29/2024. Urology consulted and recommended placing a three-way Foley catheter, patient was transferred to [facility] for further evaluation. He was still taking Eliquis .    Assessment & Plan:   Principal Problem:   Hematuria Active Problems:   CAD S/P percutaneous coronary angioplasty--09/03/2004   Hyperlipidemia   Anemia   Thrombocytopenia   Hypertension   Coronary artery disease without angina pectoris   Hematuria of unknown cause   Benign prostatic hyperplasia with lower urinary tract symptoms   Stage 3a chronic kidney disease (HCC)   History of DVT (deep vein thrombosis)   Constipation   Other chronic pain  #1 recurrent hematuria/UTI related to chronic catheter/BPH LUTS - Patient noted with recurrent hematuria felt aggravated in the setting of anticoagulation of Eliquis  and aspirin . - Three-way Foley catheter in place with clear urine this morning. - Urinalysis grew > 100,000 colonies of Morganella Morgagni sensitive to ciprofloxacin . - Urology consulted recommended IR involvement for embolization of prostatic artery. - Patient seen by IR and underwent pelvic arteriography, right prostate artery embolization on 11/13/2024. - During hospitalization Foley catheter noted to have been displaced causing a lot of pain, discomfort and hematuria the morning of 11/14/2024. - Patient seen by urology Foley catheter manipulated with improvement with pain. - Continue  finasteride , Flomax . -Continue ciprofloxacin . - Urology following and CBI clamped this morning. - Urology recommending continuation of Levsin every 4 hours as needed for bladder spasms. - Urology clearing patient for trial of anticoagulation and as such we will start Eliquis  today and monitor patient overnight. - Per urology.  2.  Acute blood loss anemia secondary to hematuria -Hemoglobin currently at 8.8 this morning. - Follow H&H. - Transfusion threshold hemoglobin < 8.  3.  Hypertension - BP stable. - IV hydralazine  as needed.  4.  BPH -Flomax .  5.  CKD 3A -Baseline creatinine 1.2-1.4. - Patient on admission to have a creatinine of 1.5. - Creatinine stable at 1.41 today.  6.  Thrombocytopenia -Likely secondary to acute infection and acute blood loss anemia. - Follow-up.  7.  History of DVT/status post penumbra thrombectomy and right common iliac vein stenting -Resume anticoagulation.  8.  History of CAD status post stent to the LAD -Patient on baby aspirin  which is currently on hold. - Eliquis  being resumed today. - Continue to hold aspirin  and likely will not resume on discharge due to presentations with recurrent hematuria. - Outpatient follow-up with cardiology.  9.  Chronic pain -Continue oxycodone  15 mg 3 times daily.  10.  Constipation  - Continue MiraLAX  twice daily, Senokot-S twice daily. - Dulcolax suppository.  11.  Pressure injury, POA Wound 11/08/24 1109 Pressure Injury Buttocks Left Deep Tissue Pressure Injury - Purple or maroon localized area of discolored intact skin or blood-filled blister due to damage of underlying soft tissue from pressure and/or shear. (Active)      Number  DVT prophylaxis: Eliquis  Code Status: Full Family Communication: Updated patient.  No family at bedside. Disposition: Home when clinically  improved, no further hematuria with initiation of anticoagulation, and when cleared by urology.  Status is: Inpatient Remains  inpatient appropriate because: Severity of illness   Consultants:  Wound care RN 11/08/2024 Urology: Dr. Elisabeth 11/08/2024 Interventional radiology: Dr. Luverne 11/11/2024  Procedures:  CT renal stone protocol 11/08/2024 CT angiogram pelvis 11/10/2024 Pelvic arteriography/right prostate artery embolization per IR: Dr.Mugweru 11/13/2024  Antimicrobials:  Anti-infectives (From admission, onward)    Start     Dose/Rate Route Frequency Ordered Stop   11/13/24 1400  cefTRIAXone  (ROCEPHIN ) 2 g in sodium chloride  0.9 % 100 mL IVPB        2 g 200 mL/hr over 30 Minutes Intravenous  Once 11/13/24 0830 11/13/24 1530   11/09/24 1030  ciprofloxacin  (CIPRO ) tablet 500 mg        500 mg Oral Every 12 hours 11/09/24 1017           Subjective: Patient laying in bed.  Noted to have some abdominal discomfort this morning which he states was attributed to bladder spasms which has improved after Foley catheter manipulation per urology this morning.  Foley catheter with clear yellow urine.  Patient denies any chest pain, no shortness of breath, no abdominal pain.  Overall feels well.  Objective: Vitals:   11/15/24 0114 11/15/24 0553 11/15/24 1323 11/15/24 1452  BP: (!) 101/57 (!) 106/52 (!) 104/58 (!) 148/68  Pulse: 81 79 91 83  Resp: 18 19 17 20   Temp: 98.9 F (37.2 C) 98.4 F (36.9 C) 98.3 F (36.8 C) 98.8 F (37.1 C)  TempSrc: Oral  Oral Oral  SpO2: 97% 94% 96% 98%  Weight:      Height:        Intake/Output Summary (Last 24 hours) at 11/15/2024 1733 Last data filed at 11/15/2024 1604 Gross per 24 hour  Intake 600 ml  Output 4450 ml  Net -3850 ml   Filed Weights   11/08/24 0224  Weight: 69.9 kg    Examination:  General exam: Appears calm and comfortable  Respiratory system: Clear to auscultation. Respiratory effort normal. Cardiovascular system: S1 & S2 heard, RRR. No JVD, murmurs, rubs, gallops or clicks. No pedal edema. Gastrointestinal system: Abdomen is nondistended, soft  and nontender. No organomegaly or masses felt. Normal bowel sounds heard. Central nervous system: Alert and oriented. No focal neurological deficits. Extremities: Symmetric 5 x 5 power. Skin: No rashes, lesions or ulcers Psychiatry: Judgement and insight appear normal. Mood & affect appropriate.     Data Reviewed: I have personally reviewed following labs and imaging studies  CBC: Recent Labs  Lab 11/11/24 0805 11/12/24 0930 11/13/24 0526 11/14/24 0853 11/15/24 1200  WBC 6.8 13.7* 9.9 9.4 12.8*  NEUTROABS  --   --   --   --  10.3*  HGB 10.1* 10.5* 9.3* 9.6* 8.8*  HCT 31.7* 33.7* 29.2* 30.1* 27.4*  MCV 96.4 97.4 97.0 97.1 96.1  PLT 118* 130* 106* 103* 98*    Basic Metabolic Panel: Recent Labs  Lab 11/11/24 0805 11/12/24 0930 11/13/24 0526 11/14/24 0853 11/15/24 1027  NA 138 141 140 139 137  K 4.1 3.8 4.9 4.4 3.9  CL 105 105 106 104 103  CO2 27 24 27 25 25   GLUCOSE 105* 125* 96 140* 132*  BUN 17 18 16 17 19   CREATININE 1.36* 1.42* 1.20 1.40* 1.41*  CALCIUM 9.4 9.6 9.2 9.4 9.2  MG  --   --   --   --  2.3    GFR: Estimated Creatinine  Clearance: 39.2 mL/min (A) (by C-G formula based on SCr of 1.41 mg/dL (H)).  Liver Function Tests: Recent Labs  Lab 11/13/24 0526  AST 13*  ALT <5  ALKPHOS 96  BILITOT 0.5  PROT 5.7*  ALBUMIN 3.2*    CBG: No results for input(s): GLUCAP in the last 168 hours.   No results found for this or any previous visit (from the past 240 hours).       Radiology Studies: No results found.       Scheduled Meds:  apixaban   5 mg Oral BID   bisacodyl   10 mg Rectal Daily   Chlorhexidine  Gluconate Cloth  6 each Topical Daily   ciprofloxacin   500 mg Oral Q12H   ferrous sulfate   325 mg Oral Q breakfast   finasteride   5 mg Oral Daily   folic acid   1 mg Oral Daily   liver oil-zinc  oxide   Topical BID   polyethylene glycol  17 g Oral BID   senna-docusate  1 tablet Oral BID   tamsulosin   0.4 mg Oral Daily   Continuous  Infusions:  sodium chloride  irrigation       LOS: 7 days    Time spent: 40 minutes    Toribio Hummer, MD [facility]   To contact the attending provider between 7A-7P or the covering provider during after hours 7P-7A, please log into the web site [email or web address] and access using universal Progress password for that web site. If you do not have the password, please call the hospital operator.  11/15/2024, 5:33 PM

## 2024-11-15 NOTE — Progress Notes (Signed)
 Mobility Specialist - Progress Note   11/15/24 1226  Mobility  Activity Ambulated with assistance  Level of Assistance Modified independent, requires aide device or extra time  Assistive Device Front wheel walker  Distance Ambulated (ft) 450 ft  Range of Motion/Exercises Active  Activity Response Tolerated well  Mobility visit 1 Mobility  Mobility Specialist Start Time (ACUTE ONLY) 1226  Mobility Specialist Stop Time (ACUTE ONLY) 1236  Mobility Specialist Time Calculation (min) (ACUTE ONLY) 10 min   Pt was found in bed and agreeable to mobilize. C/o R knee pain. At EOS returned to bed with all needs met. Call bell in reach and RN notified.   Erminio Leos,  Mobility Specialist Can be reached via Secure Chat

## 2024-11-15 NOTE — Progress Notes (Signed)
 Subjective: Travis Woodward was in some distress this morning, struggling with with severe bladder spasm since last night.  CBI on very low gtt.  Clear yellow urine.  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  Technically challenging case and only right prostatic artery was able to be embolized.    Continue finasteride  and Flomax .  Could consider Firmagon if severe bleeding recurs.  CBI on very low gtt. Clamped this am  Levsin  Q4 PRN for spasm. Improved overnight.   Okay to trial anticoagulation and discharge home  Intake/Output from previous day: 11/18 0701 - 11/19 0700 In: 3480 [P.O.:480] Out: 9530 [Urine:9530]  Intake/Output this shift: Total I/O In: 360 [P.O.:360] Out: -   Physical Exam:  General: Alert and oriented CV: No cyanosis Lungs: equal chest rise Abdomen: Soft, NTND, no rebound or guarding Gu: Three-way Foley catheter in place draining lightly blood-tinged irrigant.  CBI clamped on rounds.  Lab Results: Recent Labs    11/13/24 0526 11/14/24 0853  HGB 9.3* 9.6*  HCT 29.2* 30.1*   BMET Recent Labs    11/13/24 0526 11/14/24 0853  NA 140 139  K 4.9 4.4  CL 106 104  CO2 27 25  GLUCOSE 96 140*  BUN 16 17  CREATININE 1.20 1.40*  CALCIUM 9.2 9.4  HGB 9.3* 9.6*  WBC 9.9 9.4     Studies/Results: IR EMBO ART  VEN HEMORR LYMPH EXTRAV  INC GUIDE ROADMAPPING Result Date: 11/14/2024 INDICATION: Prostatic artery embolization. Hematuria. 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 EXAM: Title; RIGHT PROSTATE ARTERY EMBOLIZATION Listed procedures: 1. ULTRASOUND-GUIDED LEFT  RADIAL ARTERY ACCESS 2. THORACIC AORTOGRAPHY 3. PELVIC ARTERIOGRAPHY 4. CONE BEAM CT ANGIOGRAPHY of the PELVIS 5. PROSTATE ARTERY EMBOLIZATION, RIGHT MEDICATIONS: Rocephin  2 gm IV. The antibiotic was administered within one hour of the procedure 50 mg Benadryl  IV.  30 mg Toradol  IV.  500 mg Robaxin  IV ANESTHESIA/SEDATION: Moderate (conscious) sedation was employed during this procedure. A total of Versed  6 mg and Fentanyl  300 mcg was administered intravenously. Moderate Sedation Time: 130 minutes. The patient's level of consciousness and vital signs were monitored continuously by radiology nursing throughout the procedure under my direct supervision. CONTRAST:  20mL OMNIPAQUE  IOHEXOL  300 MG/ML SOLN, 4mL OMNIPAQUE  IOHEXOL  300 MG/ML SOLN, 70mL OMNIPAQUE  IOHEXOL  300 MG/ML SOLN, 30mL OMNIPAQUE  IOHEXOL  300 MG/ML SOLN FLUOROSCOPY: Radiation Exposure Index and estimated peak skin dose (PSD); Reference air kerma (RAK), 914 mGy. COMPLICATIONS: None immediate. PROCEDURE: Informed consent was obtained from the patient following explanation of the procedure, risks, benefits and alternatives. The patient understands, agrees and consents for the procedure. All questions were addressed. A time out was performed prior to the initiation of the procedure. Maximal barrier sterile technique utilized including caps, mask, sterile gowns, sterile gloves, large sterile drape, hand hygiene, and sterile prep. RADIAL ACCESS A preliminary ultrasound of the LEFT wrist was performed and demonstrates a patent radial artery. A permanent ultrasound image was recorded. The radial artery was measured for adequate size and a Barbeau test was performed deemed in the LEFT radial artery appropriate vascular access. The overlying skin was anesthetized with 1% lidocaine . Using  ultrasound guidance, access into the LEFT radial artery was obtained with visualization of needle entry into the vessel using standard micropuncture technique. A 5 French slender  sheath was placed without complication. A standard radial cocktail was slowly administered. A 5 Fr Terumo Ravi MG-2 catheter and a Bentson wire were directed under direct fluoroscopic guidance through the LEFT upper extremity and to the LEFT subclavian artery and aortic arch. Secondary to tortuosity, there was difficulty in cannulating the descending thoracic aorta. A thoracic aortogram was performed to evaluate the anatomy. The catheter was used to select the LEFT internal iliac artery then cone beam CT angiogram was performed. There was significant tortuosity of the vessels within the within the LEFT pelvis and the LEFT prostatic artery could not be cannulated despite considerable effort. Embolization was not performed. The 5 Fr catheter was retracted then inserted into the contralateral/RIGHT common iliac artery and subsequently the internal iliac artery. Cone beam CT angiogram was performed, with post processing performed at a separate workstation. Additionally, selective RIGHT prostatic arteriogram was performed followed by catheterization with a 2.0 Fr, 175 cm [employer] TruSelect cm and 0.014 inch synchro soft micro guide wire. Access was adequate for embolization. Embolization was performed with 300-500 um embosphere micro particles. Post embolization angiogram confirms stasis of the RIGHT prostatic vascular territory. Coil embolization of the RIGHT prostatic artery origin was also performed. CONE BEAM CT ANGIOGRAPHY: To utilize vascular tracking software, a cone beam CTA was then performed from the RIGHT internal iliac artery, and post processing was performed at a separate workstation. PURPOSE OF THE ARTERIOGRAMS: No previous catheter-directed angiogram was available. Therefore a new complete diagnostic angiography was performed. The decision to proceed with an interventional procedure was made based on this new diagnostic angiogram. At this point, all wires, catheters and sheaths were removed from  the patient. A TR band was placed over the LEFT wrist and after insufflation the sheath was removed. The TR band balloon was slightly deflated and then re-inflated to achieve patent hemostasis. The patient tolerated the procedure well without immediate complication was transferred to the recovery area in good condition. FINDINGS: *Tortuous thoracic aorta, with aortogram performed to aid in catheter tracking. *Diminutive, tortuous LEFT prostatic artery with difficulty in cannulation. Embolization was not performed on this side. *Dominant RIGHT prostatic artery with successful microparticle and additional coil embolization of the RIGHT prostatic artery origin. IMPRESSION: 1. Successful RIGHT prostatic artery microparticle embolization, with additional coil embolization prostatic artery origin, via transradial approach. 2. The diminutive LEFT prostatic artery could not be cannulated despite considerable effort. Embolization was not performed on this side. PLAN: The patient will be seen in the Vascular Interventional Radiology (VIR) for post-procedure follow-up evaluation in 1 month. Thom Hall, MD [facility] Radiology Electronically Signed   By: Thom Hall M.D.   On: 11/14/2024 16:38   IR US  Guide Vasc Access Left Result Date: 11/14/2024 INDICATION: Prostatic artery embolization. Hematuria. 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 EXAM: Title; RIGHT PROSTATE ARTERY EMBOLIZATION Listed procedures: 1. ULTRASOUND-GUIDED LEFT RADIAL ARTERY ACCESS 2. THORACIC AORTOGRAPHY 3. PELVIC ARTERIOGRAPHY 4. CONE BEAM CT ANGIOGRAPHY of the PELVIS 5. PROSTATE ARTERY EMBOLIZATION, RIGHT MEDICATIONS: Rocephin  2 gm IV. The antibiotic was administered within one hour of the procedure 50 mg Benadryl  IV.  30 mg Toradol  IV.  500 mg Robaxin  IV ANESTHESIA/SEDATION: Moderate (conscious) sedation was employed during this procedure. A total of Versed  6 mg and  Fentanyl  300 mcg was administered intravenously. Moderate  Sedation Time: 130 minutes. The patient's level of consciousness and vital signs were monitored continuously by radiology nursing throughout the procedure under my direct supervision. CONTRAST:  20mL OMNIPAQUE  IOHEXOL  300 MG/ML SOLN, 4mL OMNIPAQUE  IOHEXOL  300 MG/ML SOLN, 70mL OMNIPAQUE  IOHEXOL  300 MG/ML SOLN, 30mL OMNIPAQUE  IOHEXOL  300 MG/ML SOLN FLUOROSCOPY: Radiation Exposure Index and estimated peak skin dose (PSD); Reference air kerma (RAK), 914 mGy. COMPLICATIONS: None immediate. PROCEDURE: Informed consent was obtained from the patient following explanation of the procedure, risks, benefits and alternatives. The patient understands, agrees and consents for the procedure. All questions were addressed. A time out was performed prior to the initiation of the procedure. Maximal barrier sterile technique utilized including caps, mask, sterile gowns, sterile gloves, large sterile drape, hand hygiene, and sterile prep. RADIAL ACCESS A preliminary ultrasound of the LEFT wrist was performed and demonstrates a patent radial artery. A permanent ultrasound image was recorded. The radial artery was measured for adequate size and a Barbeau test was performed deemed in the LEFT radial artery appropriate vascular access. The overlying skin was anesthetized with 1% lidocaine . Using ultrasound guidance, access into the LEFT radial artery was obtained with visualization of needle entry into the vessel using standard micropuncture technique. A 5 French slender sheath was placed without complication. A standard radial cocktail was slowly administered. A 5 Fr Terumo Ravi MG-2 catheter and a Bentson wire were directed under direct fluoroscopic guidance through the LEFT upper extremity and to the LEFT subclavian artery and aortic arch. Secondary to tortuosity, there was difficulty in cannulating the descending thoracic aorta. A thoracic aortogram was performed to evaluate the  anatomy. The catheter was used to select the LEFT internal iliac artery then cone beam CT angiogram was performed. There was significant tortuosity of the vessels within the within the LEFT pelvis and the LEFT prostatic artery could not be cannulated despite considerable effort. Embolization was not performed. The 5 Fr catheter was retracted then inserted into the contralateral/RIGHT common iliac artery and subsequently the internal iliac artery. Cone beam CT angiogram was performed, with post processing performed at a separate workstation. Additionally, selective RIGHT prostatic arteriogram was performed followed by catheterization with a 2.0 Fr, 175 cm [employer] TruSelect cm and 0.014 inch synchro soft micro guide wire. Access was adequate for embolization. Embolization was performed with 300-500 um embosphere micro particles. Post embolization angiogram confirms stasis of the RIGHT prostatic vascular territory. Coil embolization of the RIGHT prostatic artery origin was also performed. CONE BEAM CT ANGIOGRAPHY: To utilize vascular tracking software, a cone beam CTA was then performed from the RIGHT internal iliac artery, and post processing was performed at a separate workstation. PURPOSE OF THE ARTERIOGRAMS: No previous catheter-directed angiogram was available. Therefore a new complete diagnostic angiography was performed. The decision to proceed with an interventional procedure was made based on this new diagnostic angiogram. At this point, all wires, catheters and sheaths were removed from the patient. A TR band was placed over the LEFT wrist and after insufflation the sheath was removed. The TR band balloon was slightly deflated and then re-inflated to achieve patent hemostasis. The patient tolerated the procedure well without immediate complication was transferred to the recovery area in good condition. FINDINGS: *Tortuous thoracic aorta, with aortogram performed to aid in catheter tracking.  *Diminutive, tortuous LEFT prostatic artery with difficulty in cannulation. Embolization was not performed on this side. *Dominant RIGHT prostatic artery with successful microparticle and additional coil embolization of the RIGHT prostatic artery origin. IMPRESSION: 1. Successful RIGHT prostatic  artery microparticle embolization, with additional coil embolization prostatic artery origin, via transradial approach. 2. The diminutive LEFT prostatic artery could not be cannulated despite considerable effort. Embolization was not performed on this side. PLAN: The patient will be seen in the Vascular Interventional Radiology (VIR) for post-procedure follow-up evaluation in 1 month. Thom Hall, MD [facility] and Interventional Radiology Specialists [facility] Radiology Electronically Signed   By: Thom Hall M.D.   On: 11/14/2024 16:38   IR Aorta/Thoracic Result Date: 11/14/2024 INDICATION: Prostatic artery embolization. Hematuria. 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 EXAM: Title; RIGHT PROSTATE ARTERY EMBOLIZATION Listed procedures: 1. ULTRASOUND-GUIDED LEFT RADIAL ARTERY ACCESS 2. THORACIC AORTOGRAPHY 3. PELVIC ARTERIOGRAPHY 4. CONE BEAM CT ANGIOGRAPHY of the PELVIS 5. PROSTATE ARTERY EMBOLIZATION, RIGHT MEDICATIONS: Rocephin  2 gm IV. The antibiotic was administered within one hour of the procedure 50 mg Benadryl  IV.  30 mg Toradol  IV.  500 mg Robaxin  IV ANESTHESIA/SEDATION: Moderate (conscious) sedation was employed during this procedure. A total of Versed  6 mg and Fentanyl  300 mcg was administered intravenously. Moderate Sedation Time: 130 minutes. The patient's level of consciousness and vital signs were monitored continuously by radiology nursing throughout the procedure under my direct supervision. CONTRAST:  20mL OMNIPAQUE  IOHEXOL  300 MG/ML SOLN, 4mL OMNIPAQUE  IOHEXOL  300 MG/ML SOLN, 70mL OMNIPAQUE  IOHEXOL  300 MG/ML SOLN, 30mL OMNIPAQUE  IOHEXOL  300 MG/ML SOLN FLUOROSCOPY:  Radiation Exposure Index and estimated peak skin dose (PSD); Reference air kerma (RAK), 914 mGy. COMPLICATIONS: None immediate. PROCEDURE: Informed consent was obtained from the patient following explanation of the procedure, risks, benefits and alternatives. The patient understands, agrees and consents for the procedure. All questions were addressed. A time out was performed prior to the initiation of the procedure. Maximal barrier sterile technique utilized including caps, mask, sterile gowns, sterile gloves, large sterile drape, hand hygiene, and sterile prep. RADIAL ACCESS A preliminary ultrasound of the LEFT wrist was performed and demonstrates a patent radial artery. A permanent ultrasound image was recorded. The radial artery was measured for adequate size and a Barbeau test was performed deemed in the LEFT radial artery appropriate vascular access. The overlying skin was anesthetized with 1% lidocaine . Using ultrasound guidance, access into the LEFT radial artery was obtained with visualization of needle entry into the vessel using standard micropuncture technique. A 5 French slender sheath was placed without complication. A standard radial cocktail was slowly administered. A 5 Fr Terumo Ravi MG-2 catheter and a Bentson wire were directed under direct fluoroscopic guidance through the LEFT upper extremity and to the LEFT subclavian artery and aortic arch. Secondary to tortuosity, there was difficulty in cannulating the descending thoracic aorta. A thoracic aortogram was performed to evaluate the anatomy. The catheter was used to select the LEFT internal iliac artery then cone beam CT angiogram was performed. There was significant tortuosity of the vessels within the within the LEFT pelvis and the LEFT prostatic artery could not be cannulated despite considerable effort. Embolization was not performed. The 5 Fr catheter was retracted then inserted into the contralateral/RIGHT common iliac artery and  subsequently the internal iliac artery. Cone beam CT angiogram was performed, with post processing performed at a separate workstation. Additionally, selective RIGHT prostatic arteriogram was performed followed by catheterization with a 2.0 Fr, 175 cm [employer] TruSelect cm and 0.014 inch synchro soft micro guide wire. Access was adequate for embolization. Embolization was performed with 300-500 um embosphere micro particles. Post embolization angiogram confirms stasis of the RIGHT prostatic vascular territory. Coil embolization of the RIGHT  prostatic artery origin was also performed. CONE BEAM CT ANGIOGRAPHY: To utilize vascular tracking software, a cone beam CTA was then performed from the RIGHT internal iliac artery, and post processing was performed at a separate workstation. PURPOSE OF THE ARTERIOGRAMS: No previous catheter-directed angiogram was available. Therefore a new complete diagnostic angiography was performed. The decision to proceed with an interventional procedure was made based on this new diagnostic angiogram. At this point, all wires, catheters and sheaths were removed from the patient. A TR band was placed over the LEFT wrist and after insufflation the sheath was removed. The TR band balloon was slightly deflated and then re-inflated to achieve patent hemostasis. The patient tolerated the procedure well without immediate complication was transferred to the recovery area in good condition. FINDINGS: *Tortuous thoracic aorta, with aortogram performed to aid in catheter tracking. *Diminutive, tortuous LEFT prostatic artery with difficulty in cannulation. Embolization was not performed on this side. *Dominant RIGHT prostatic artery with successful microparticle and additional coil embolization of the RIGHT prostatic artery origin. IMPRESSION: 1. Successful RIGHT prostatic artery microparticle embolization, with additional coil embolization prostatic artery origin, via transradial approach. 2.  The diminutive LEFT prostatic artery could not be cannulated despite considerable effort. Embolization was not performed on this side. PLAN: The patient will be seen in the Vascular Interventional Radiology (VIR) for post-procedure follow-up evaluation in 1 month. Thom Hall, MD [facility] and Interventional Radiology Specialists [facility] Radiology Electronically Signed   By: Thom Hall M.D.   On: 11/14/2024 16:38   IR 3D Independent [place] Result Date: 11/14/2024 INDICATION: Prostatic artery embolization. Hematuria. 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 EXAM: Title; RIGHT PROSTATE ARTERY EMBOLIZATION Listed procedures: 1. ULTRASOUND-GUIDED LEFT RADIAL ARTERY ACCESS 2. THORACIC AORTOGRAPHY 3. PELVIC ARTERIOGRAPHY 4. CONE BEAM CT ANGIOGRAPHY of the PELVIS 5. PROSTATE ARTERY EMBOLIZATION, RIGHT MEDICATIONS: Rocephin  2 gm IV. The antibiotic was administered within one hour of the procedure 50 mg Benadryl  IV.  30 mg Toradol  IV.  500 mg Robaxin  IV ANESTHESIA/SEDATION: Moderate (conscious) sedation was employed during this procedure. A total of Versed  6 mg and Fentanyl  300 mcg was administered intravenously. Moderate Sedation Time: 130 minutes. The patient's level of consciousness and vital signs were monitored continuously by radiology nursing throughout the procedure under my direct supervision. CONTRAST:  20mL OMNIPAQUE  IOHEXOL  300 MG/ML SOLN, 4mL OMNIPAQUE  IOHEXOL  300 MG/ML SOLN, 70mL OMNIPAQUE  IOHEXOL  300 MG/ML SOLN, 30mL OMNIPAQUE  IOHEXOL  300 MG/ML SOLN FLUOROSCOPY: Radiation Exposure Index and estimated peak skin dose (PSD); Reference air kerma (RAK), 914 mGy. COMPLICATIONS: None immediate. PROCEDURE: Informed consent was obtained from the patient following explanation of the procedure, risks, benefits and alternatives. The patient understands, agrees and consents for the procedure. All questions were addressed. A time out was performed prior to the initiation of the procedure.  Maximal barrier sterile technique utilized including caps, mask, sterile gowns, sterile gloves, large sterile drape, hand hygiene, and sterile prep. RADIAL ACCESS A preliminary ultrasound of the LEFT wrist was performed and demonstrates a patent radial artery. A permanent ultrasound image was recorded. The radial artery was measured for adequate size and a Barbeau test was performed deemed in the LEFT radial artery appropriate vascular access. The overlying skin was anesthetized with 1% lidocaine . Using ultrasound guidance, access into the LEFT radial artery was obtained with visualization of needle entry into the vessel using standard micropuncture technique. A 5 French slender sheath was placed without complication. A standard radial cocktail was slowly administered. A 5 Fr Terumo Ravi MG-2 catheter  and a Bentson wire were directed under direct fluoroscopic guidance through the LEFT upper extremity and to the LEFT subclavian artery and aortic arch. Secondary to tortuosity, there was difficulty in cannulating the descending thoracic aorta. A thoracic aortogram was performed to evaluate the anatomy. The catheter was used to select the LEFT internal iliac artery then cone beam CT angiogram was performed. There was significant tortuosity of the vessels within the within the LEFT pelvis and the LEFT prostatic artery could not be cannulated despite considerable effort. Embolization was not performed. The 5 Fr catheter was retracted then inserted into the contralateral/RIGHT common iliac artery and subsequently the internal iliac artery. Cone beam CT angiogram was performed, with post processing performed at a separate workstation. Additionally, selective RIGHT prostatic arteriogram was performed followed by catheterization with a 2.0 Fr, 175 cm [employer] TruSelect cm and 0.014 inch synchro soft micro guide wire. Access was adequate for embolization. Embolization was performed with 300-500 um embosphere micro  particles. Post embolization angiogram confirms stasis of the RIGHT prostatic vascular territory. Coil embolization of the RIGHT prostatic artery origin was also performed. CONE BEAM CT ANGIOGRAPHY: To utilize vascular tracking software, a cone beam CTA was then performed from the RIGHT internal iliac artery, and post processing was performed at a separate workstation. PURPOSE OF THE ARTERIOGRAMS: No previous catheter-directed angiogram was available. Therefore a new complete diagnostic angiography was performed. The decision to proceed with an interventional procedure was made based on this new diagnostic angiogram. At this point, all wires, catheters and sheaths were removed from the patient. A TR band was placed over the LEFT wrist and after insufflation the sheath was removed. The TR band balloon was slightly deflated and then re-inflated to achieve patent hemostasis. The patient tolerated the procedure well without immediate complication was transferred to the recovery area in good condition. FINDINGS: *Tortuous thoracic aorta, with aortogram performed to aid in catheter tracking. *Diminutive, tortuous LEFT prostatic artery with difficulty in cannulation. Embolization was not performed on this side. *Dominant RIGHT prostatic artery with successful microparticle and additional coil embolization of the RIGHT prostatic artery origin. IMPRESSION: 1. Successful RIGHT prostatic artery microparticle embolization, with additional coil embolization prostatic artery origin, via transradial approach. 2. The diminutive LEFT prostatic artery could not be cannulated despite considerable effort. Embolization was not performed on this side. PLAN: The patient will be seen in the Vascular Interventional Radiology (VIR) for post-procedure follow-up evaluation in 1 month. Thom Hall, MD [facility]ascular and Interventional Radiology Specialists [facility] Radiology Electronically Signed   By: Thom Hall M.D.   On: 11/14/2024 16:38    IR CT PELVIS W/CM Result Date: 11/14/2024 INDICATION: Prostatic artery embolization. Hematuria. 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 EXAM: Title; RIGHT PROSTATE ARTERY EMBOLIZATION Listed procedures: 1. ULTRASOUND-GUIDED LEFT RADIAL ARTERY ACCESS 2. THORACIC AORTOGRAPHY 3. PELVIC ARTERIOGRAPHY 4. CONE BEAM CT ANGIOGRAPHY of the PELVIS 5. PROSTATE ARTERY EMBOLIZATION, RIGHT MEDICATIONS: Rocephin  2 gm IV. The antibiotic was administered within one hour of the procedure 50 mg Benadryl  IV.  30 mg Toradol  IV.  500 mg Robaxin  IV ANESTHESIA/SEDATION: Moderate (conscious) sedation was employed during this procedure. A total of Versed  6 mg and Fentanyl  300 mcg was administered intravenously. Moderate Sedation Time: 130 minutes. The patient's level of consciousness and vital signs were monitored continuously by radiology nursing throughout the procedure under my direct supervision. CONTRAST:  20mL OMNIPAQUE  IOHEXOL  300 MG/ML SOLN, 4mL OMNIPAQUE  IOHEXOL  300 MG/ML SOLN, 70mL OMNIPAQUE  IOHEXOL  300 MG/ML SOLN, 30mL OMNIPAQUE  IOHEXOL   300 MG/ML SOLN FLUOROSCOPY: Radiation Exposure Index and estimated peak skin dose (PSD); Reference air kerma (RAK), 914 mGy. COMPLICATIONS: None immediate. PROCEDURE: Informed consent was obtained from the patient following explanation of the procedure, risks, benefits and alternatives. The patient understands, agrees and consents for the procedure. All questions were addressed. A time out was performed prior to the initiation of the procedure. Maximal barrier sterile technique utilized including caps, mask, sterile gowns, sterile gloves, large sterile drape, hand hygiene, and sterile prep. RADIAL ACCESS A preliminary ultrasound of the LEFT wrist was performed and demonstrates a patent radial artery. A permanent ultrasound image was recorded. The radial artery was measured for adequate size and a Barbeau test was performed deemed in the LEFT radial artery  appropriate vascular access. The overlying skin was anesthetized with 1% lidocaine . Using ultrasound guidance, access into the LEFT radial artery was obtained with visualization of needle entry into the vessel using standard micropuncture technique. A 5 French slender sheath was placed without complication. A standard radial cocktail was slowly administered. A 5 Fr Terumo Ravi MG-2 catheter and a Bentson wire were directed under direct fluoroscopic guidance through the LEFT upper extremity and to the LEFT subclavian artery and aortic arch. Secondary to tortuosity, there was difficulty in cannulating the descending thoracic aorta. A thoracic aortogram was performed to evaluate the anatomy. The catheter was used to select the LEFT internal iliac artery then cone beam CT angiogram was performed. There was significant tortuosity of the vessels within the within the LEFT pelvis and the LEFT prostatic artery could not be cannulated despite considerable effort. Embolization was not performed. The 5 Fr catheter was retracted then inserted into the contralateral/RIGHT common iliac artery and subsequently the internal iliac artery. Cone beam CT angiogram was performed, with post processing performed at a separate workstation. Additionally, selective RIGHT prostatic arteriogram was performed followed by catheterization with a 2.0 Fr, 175 cm [employer] TruSelect cm and 0.014 inch synchro soft micro guide wire. Access was adequate for embolization. Embolization was performed with 300-500 um embosphere micro particles. Post embolization angiogram confirms stasis of the RIGHT prostatic vascular territory. Coil embolization of the RIGHT prostatic artery origin was also performed. CONE BEAM CT ANGIOGRAPHY: To utilize vascular tracking software, a cone beam CTA was then performed from the RIGHT internal iliac artery, and post processing was performed at a separate workstation. PURPOSE OF THE ARTERIOGRAMS: No previous  catheter-directed angiogram was available. Therefore a new complete diagnostic angiography was performed. The decision to proceed with an interventional procedure was made based on this new diagnostic angiogram. At this point, all wires, catheters and sheaths were removed from the patient. A TR band was placed over the LEFT wrist and after insufflation the sheath was removed. The TR band balloon was slightly deflated and then re-inflated to achieve patent hemostasis. The patient tolerated the procedure well without immediate complication was transferred to the recovery area in good condition. FINDINGS: *Tortuous thoracic aorta, with aortogram performed to aid in catheter tracking. *Diminutive, tortuous LEFT prostatic artery with difficulty in cannulation. Embolization was not performed on this side. *Dominant RIGHT prostatic artery with successful microparticle and additional coil embolization of the RIGHT prostatic artery origin. IMPRESSION: 1. Successful RIGHT prostatic artery microparticle embolization, with additional coil embolization prostatic artery origin, via transradial approach. 2. The diminutive LEFT prostatic artery could not be cannulated despite considerable effort. Embolization was not performed on this side. PLAN: The patient will be seen in the Vascular Interventional Radiology (VIR) for post-procedure follow-up  evaluation in 1 month. Thom Hall, MD [facility] Radiology Electronically Signed   By: Thom Hall M.D.   On: 11/14/2024 16:38   IR Angiogram Pelvis Selective Or Supraselective Result Date: 11/14/2024 INDICATION: Prostatic artery embolization. Hematuria. 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 EXAM: Title; RIGHT PROSTATE ARTERY EMBOLIZATION Listed procedures: 1. ULTRASOUND-GUIDED LEFT RADIAL ARTERY ACCESS 2. THORACIC AORTOGRAPHY 3. PELVIC ARTERIOGRAPHY 4. CONE BEAM CT ANGIOGRAPHY of the PELVIS 5. PROSTATE  ARTERY EMBOLIZATION, RIGHT MEDICATIONS: Rocephin  2 gm IV. The antibiotic was administered within one hour of the procedure 50 mg Benadryl  IV.  30 mg Toradol  IV.  500 mg Robaxin  IV ANESTHESIA/SEDATION: Moderate (conscious) sedation was employed during this procedure. A total of Versed  6 mg and Fentanyl  300 mcg was administered intravenously. Moderate Sedation Time: 130 minutes. The patient's level of consciousness and vital signs were monitored continuously by radiology nursing throughout the procedure under my direct supervision. CONTRAST:  20mL OMNIPAQUE  IOHEXOL  300 MG/ML SOLN, 4mL OMNIPAQUE  IOHEXOL  300 MG/ML SOLN, 70mL OMNIPAQUE  IOHEXOL  300 MG/ML SOLN, 30mL OMNIPAQUE  IOHEXOL  300 MG/ML SOLN FLUOROSCOPY: Radiation Exposure Index and estimated peak skin dose (PSD); Reference air kerma (RAK), 914 mGy. COMPLICATIONS: None immediate. PROCEDURE: Informed consent was obtained from the patient following explanation of the procedure, risks, benefits and alternatives. The patient understands, agrees and consents for the procedure. All questions were addressed. A time out was performed prior to the initiation of the procedure. Maximal barrier sterile technique utilized including caps, mask, sterile gowns, sterile gloves, large sterile drape, hand hygiene, and sterile prep. RADIAL ACCESS A preliminary ultrasound of the LEFT wrist was performed and demonstrates a patent radial artery. A permanent ultrasound image was recorded. The radial artery was measured for adequate size and a Barbeau test was performed deemed in the LEFT radial artery appropriate vascular access. The overlying skin was anesthetized with 1% lidocaine . Using ultrasound guidance, access into the LEFT radial artery was obtained with visualization of needle entry into the vessel using standard micropuncture technique. A 5 French slender sheath was placed without complication. A standard radial cocktail was slowly administered. A 5 Fr Terumo Ravi MG-2 catheter  and a Bentson wire were directed under direct fluoroscopic guidance through the LEFT upper extremity and to the LEFT subclavian artery and aortic arch. Secondary to tortuosity, there was difficulty in cannulating the descending thoracic aorta. A thoracic aortogram was performed to evaluate the anatomy. The catheter was used to select the LEFT internal iliac artery then cone beam CT angiogram was performed. There was significant tortuosity of the vessels within the within the LEFT pelvis and the LEFT prostatic artery could not be cannulated despite considerable effort. Embolization was not performed. The 5 Fr catheter was retracted then inserted into the contralateral/RIGHT common iliac artery and subsequently the internal iliac artery. Cone beam CT angiogram was performed, with post processing performed at a separate workstation. Additionally, selective RIGHT prostatic arteriogram was performed followed by catheterization with a 2.0 Fr, 175 cm [employer] TruSelect cm and 0.014 inch synchro soft micro guide wire. Access was adequate for embolization. Embolization was performed with 300-500 um embosphere micro particles. Post embolization angiogram confirms stasis of the RIGHT prostatic vascular territory. Coil embolization of the RIGHT prostatic artery origin was also performed. CONE BEAM CT ANGIOGRAPHY: To utilize vascular tracking software, a cone beam CTA was then performed from the RIGHT internal iliac artery, and post processing was performed at a separate workstation. PURPOSE OF THE ARTERIOGRAMS: No previous catheter-directed  angiogram was available. Therefore a new complete diagnostic angiography was performed. The decision to proceed with an interventional procedure was made based on this new diagnostic angiogram. At this point, all wires, catheters and sheaths were removed from the patient. A TR band was placed over the LEFT wrist and after insufflation the sheath was removed. The TR band balloon was  slightly deflated and then re-inflated to achieve patent hemostasis. The patient tolerated the procedure well without immediate complication was transferred to the recovery area in good condition. FINDINGS: *Tortuous thoracic aorta, with aortogram performed to aid in catheter tracking. *Diminutive, tortuous LEFT prostatic artery with difficulty in cannulation. Embolization was not performed on this side. *Dominant RIGHT prostatic artery with successful microparticle and additional coil embolization of the RIGHT prostatic artery origin. IMPRESSION: 1. Successful RIGHT prostatic artery microparticle embolization, with additional coil embolization prostatic artery origin, via transradial approach. 2. The diminutive LEFT prostatic artery could not be cannulated despite considerable effort. Embolization was not performed on this side. PLAN: The patient will be seen in the Vascular Interventional Radiology (VIR) for post-procedure follow-up evaluation in 1 month. Thom Hall, MD [facility] Radiology Electronically Signed   By: Thom Hall M.D.   On: 11/14/2024 16:38   IR Angiogram Selective Each Additional Vessel Result Date: 11/14/2024 INDICATION: Prostatic artery embolization. Hematuria. 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 EXAM: Title; RIGHT PROSTATE ARTERY EMBOLIZATION Listed procedures: 1. ULTRASOUND-GUIDED LEFT RADIAL ARTERY ACCESS 2. THORACIC AORTOGRAPHY 3. PELVIC ARTERIOGRAPHY 4. CONE BEAM CT ANGIOGRAPHY of the PELVIS 5. PROSTATE ARTERY EMBOLIZATION, RIGHT MEDICATIONS: Rocephin  2 gm IV. The antibiotic was administered within one hour of the procedure 50 mg Benadryl  IV.  30 mg Toradol  IV.  500 mg Robaxin  IV ANESTHESIA/SEDATION: Moderate (conscious) sedation was employed during this procedure. A total of Versed  6 mg and Fentanyl  300 mcg was administered intravenously. Moderate Sedation Time: 130 minutes. The patient's level of  consciousness and vital signs were monitored continuously by radiology nursing throughout the procedure under my direct supervision. CONTRAST:  20mL OMNIPAQUE  IOHEXOL  300 MG/ML SOLN, 4mL OMNIPAQUE  IOHEXOL  300 MG/ML SOLN, 70mL OMNIPAQUE  IOHEXOL  300 MG/ML SOLN, 30mL OMNIPAQUE  IOHEXOL  300 MG/ML SOLN FLUOROSCOPY: Radiation Exposure Index and estimated peak skin dose (PSD); Reference air kerma (RAK), 914 mGy. COMPLICATIONS: None immediate. PROCEDURE: Informed consent was obtained from the patient following explanation of the procedure, risks, benefits and alternatives. The patient understands, agrees and consents for the procedure. All questions were addressed. A time out was performed prior to the initiation of the procedure. Maximal barrier sterile technique utilized including caps, mask, sterile gowns, sterile gloves, large sterile drape, hand hygiene, and sterile prep. RADIAL ACCESS A preliminary ultrasound of the LEFT wrist was performed and demonstrates a patent radial artery. A permanent ultrasound image was recorded. The radial artery was measured for adequate size and a Barbeau test was performed deemed in the LEFT radial artery appropriate vascular access. The overlying skin was anesthetized with 1% lidocaine . Using ultrasound guidance, access into the LEFT radial artery was obtained with visualization of needle entry into the vessel using standard micropuncture technique. A 5 French slender sheath was placed without complication. A standard radial cocktail was slowly administered. A 5 Fr Terumo Ravi MG-2 catheter and a Bentson wire were directed under direct fluoroscopic guidance through the LEFT upper extremity and to the LEFT subclavian artery and aortic arch. Secondary to tortuosity, there was difficulty in cannulating the descending thoracic aorta. A thoracic aortogram was performed to  evaluate the anatomy. The catheter was used to select the LEFT internal iliac artery then cone beam CT angiogram was  performed. There was significant tortuosity of the vessels within the within the LEFT pelvis and the LEFT prostatic artery could not be cannulated despite considerable effort. Embolization was not performed. The 5 Fr catheter was retracted then inserted into the contralateral/RIGHT common iliac artery and subsequently the internal iliac artery. Cone beam CT angiogram was performed, with post processing performed at a separate workstation. Additionally, selective RIGHT prostatic arteriogram was performed followed by catheterization with a 2.0 Fr, 175 cm [employer] TruSelect cm and 0.014 inch synchro soft micro guide wire. Access was adequate for embolization. Embolization was performed with 300-500 um embosphere micro particles. Post embolization angiogram confirms stasis of the RIGHT prostatic vascular territory. Coil embolization of the RIGHT prostatic artery origin was also performed. CONE BEAM CT ANGIOGRAPHY: To utilize vascular tracking software, a cone beam CTA was then performed from the RIGHT internal iliac artery, and post processing was performed at a separate workstation. PURPOSE OF THE ARTERIOGRAMS: No previous catheter-directed angiogram was available. Therefore a new complete diagnostic angiography was performed. The decision to proceed with an interventional procedure was made based on this new diagnostic angiogram. At this point, all wires, catheters and sheaths were removed from the patient. A TR band was placed over the LEFT wrist and after insufflation the sheath was removed. The TR band balloon was slightly deflated and then re-inflated to achieve patent hemostasis. The patient tolerated the procedure well without immediate complication was transferred to the recovery area in good condition. FINDINGS: *Tortuous thoracic aorta, with aortogram performed to aid in catheter tracking. *Diminutive, tortuous LEFT prostatic artery with difficulty in cannulation. Embolization was not performed on  this side. *Dominant RIGHT prostatic artery with successful microparticle and additional coil embolization of the RIGHT prostatic artery origin. IMPRESSION: 1. Successful RIGHT prostatic artery microparticle embolization, with additional coil embolization prostatic artery origin, via transradial approach. 2. The diminutive LEFT prostatic artery could not be cannulated despite considerable effort. Embolization was not performed on this side. PLAN: The patient will be seen in the Vascular Interventional Radiology (VIR) for post-procedure follow-up evaluation in 1 month. Thom Hall, MD [facility] Radiology Electronically Signed   By: Thom Hall M.D.   On: 11/14/2024 16:38   IR Angiogram Selective Each Additional Vessel Result Date: 11/14/2024 INDICATION: Prostatic artery embolization. Hematuria. 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 EXAM: Title; RIGHT PROSTATE ARTERY EMBOLIZATION Listed procedures: 1. ULTRASOUND-GUIDED LEFT RADIAL ARTERY ACCESS 2. THORACIC AORTOGRAPHY 3. PELVIC ARTERIOGRAPHY 4. CONE BEAM CT ANGIOGRAPHY of the PELVIS 5. PROSTATE ARTERY EMBOLIZATION, RIGHT MEDICATIONS: Rocephin  2 gm IV. The antibiotic was administered within one hour of the procedure 50 mg Benadryl  IV.  30 mg Toradol  IV.  500 mg Robaxin  IV ANESTHESIA/SEDATION: Moderate (conscious) sedation was employed during this procedure. A total of Versed  6 mg and Fentanyl  300 mcg was administered intravenously. Moderate Sedation Time: 130 minutes. The patient's level of consciousness and vital signs were monitored continuously by radiology nursing throughout the procedure under my direct supervision. CONTRAST:  20mL OMNIPAQUE  IOHEXOL  300 MG/ML SOLN, 4mL OMNIPAQUE  IOHEXOL  300 MG/ML SOLN, 70mL OMNIPAQUE  IOHEXOL  300 MG/ML SOLN, 30mL OMNIPAQUE  IOHEXOL  300 MG/ML SOLN FLUOROSCOPY: Radiation Exposure Index and estimated peak skin dose (PSD); Reference air kerma (RAK),  914 mGy. COMPLICATIONS: None immediate. PROCEDURE: Informed consent was obtained from the patient following explanation of the procedure, risks, benefits and alternatives.  The patient understands, agrees and consents for the procedure. All questions were addressed. A time out was performed prior to the initiation of the procedure. Maximal barrier sterile technique utilized including caps, mask, sterile gowns, sterile gloves, large sterile drape, hand hygiene, and sterile prep. RADIAL ACCESS A preliminary ultrasound of the LEFT wrist was performed and demonstrates a patent radial artery. A permanent ultrasound image was recorded. The radial artery was measured for adequate size and a Barbeau test was performed deemed in the LEFT radial artery appropriate vascular access. The overlying skin was anesthetized with 1% lidocaine . Using ultrasound guidance, access into the LEFT radial artery was obtained with visualization of needle entry into the vessel using standard micropuncture technique. A 5 French slender sheath was placed without complication. A standard radial cocktail was slowly administered. A 5 Fr Terumo Ravi MG-2 catheter and a Bentson wire were directed under direct fluoroscopic guidance through the LEFT upper extremity and to the LEFT subclavian artery and aortic arch. Secondary to tortuosity, there was difficulty in cannulating the descending thoracic aorta. A thoracic aortogram was performed to evaluate the anatomy. The catheter was used to select the LEFT internal iliac artery then cone beam CT angiogram was performed. There was significant tortuosity of the vessels within the within the LEFT pelvis and the LEFT prostatic artery could not be cannulated despite considerable effort. Embolization was not performed. The 5 Fr catheter was retracted then inserted into the contralateral/RIGHT common iliac artery and subsequently the internal iliac artery. Cone beam CT angiogram was performed, with post  processing performed at a separate workstation. Additionally, selective RIGHT prostatic arteriogram was performed followed by catheterization with a 2.0 Fr, 175 cm [employer] TruSelect cm and 0.014 inch synchro soft micro guide wire. Access was adequate for embolization. Embolization was performed with 300-500 um embosphere micro particles. Post embolization angiogram confirms stasis of the RIGHT prostatic vascular territory. Coil embolization of the RIGHT prostatic artery origin was also performed. CONE BEAM CT ANGIOGRAPHY: To utilize vascular tracking software, a cone beam CTA was then performed from the RIGHT internal iliac artery, and post processing was performed at a separate workstation. PURPOSE OF THE ARTERIOGRAMS: No previous catheter-directed angiogram was available. Therefore a new complete diagnostic angiography was performed. The decision to proceed with an interventional procedure was made based on this new diagnostic angiogram. At this point, all wires, catheters and sheaths were removed from the patient. A TR band was placed over the LEFT wrist and after insufflation the sheath was removed. The TR band balloon was slightly deflated and then re-inflated to achieve patent hemostasis. The patient tolerated the procedure well without immediate complication was transferred to the recovery area in good condition. FINDINGS: *Tortuous thoracic aorta, with aortogram performed to aid in catheter tracking. *Diminutive, tortuous LEFT prostatic artery with difficulty in cannulation. Embolization was not performed on this side. *Dominant RIGHT prostatic artery with successful microparticle and additional coil embolization of the RIGHT prostatic artery origin. IMPRESSION: 1. Successful RIGHT prostatic artery microparticle embolization, with additional coil embolization prostatic artery origin, via transradial approach. 2. The diminutive LEFT prostatic artery could not be cannulated despite considerable  effort. Embolization was not performed on this side. PLAN: The patient will be seen in the Vascular Interventional Radiology (VIR) for post-procedure follow-up evaluation in 1 month. Thom Hall, MD [facility] Radiology Electronically Signed   By: Thom Hall M.D.   On: 11/14/2024 16:38       LOS: 7 days  Travis Bourdon, NP [facility] Pager: [phone]  11/15/2024, 11:06 AM

## 2024-11-16 DIAGNOSIS — R310 Gross hematuria: Secondary | ICD-10-CM | POA: Diagnosis not present

## 2024-11-16 DIAGNOSIS — I2510 Atherosclerotic heart disease of native coronary artery without angina pectoris: Secondary | ICD-10-CM | POA: Diagnosis not present

## 2024-11-16 DIAGNOSIS — E782 Mixed hyperlipidemia: Secondary | ICD-10-CM | POA: Diagnosis not present

## 2024-11-16 DIAGNOSIS — D649 Anemia, unspecified: Secondary | ICD-10-CM | POA: Diagnosis not present

## 2024-11-16 LAB — CBC WITH DIFFERENTIAL/PLATELET
Abs Immature Granulocytes: 0.13 K/uL — ABNORMAL HIGH (ref 0.00–0.07)
Basophils Absolute: 0.1 K/uL (ref 0.0–0.1)
Basophils Relative: 1 %
Eosinophils Absolute: 0.1 K/uL (ref 0.0–0.5)
Eosinophils Relative: 1 %
HCT: 26.6 % — ABNORMAL LOW (ref 39.0–52.0)
Hemoglobin: 8.5 g/dL — ABNORMAL LOW (ref 13.0–17.0)
Immature Granulocytes: 1 %
Lymphocytes Relative: 12 %
Lymphs Abs: 1.5 K/uL (ref 0.7–4.0)
MCH: 30.9 pg (ref 26.0–34.0)
MCHC: 32 g/dL (ref 30.0–36.0)
MCV: 96.7 fL (ref 80.0–100.0)
Monocytes Absolute: 1.3 K/uL — ABNORMAL HIGH (ref 0.1–1.0)
Monocytes Relative: 10 %
Neutro Abs: 9.6 K/uL — ABNORMAL HIGH (ref 1.7–7.7)
Neutrophils Relative %: 75 %
Platelets: 95 K/uL — ABNORMAL LOW (ref 150–400)
RBC: 2.75 MIL/uL — ABNORMAL LOW (ref 4.22–5.81)
RDW: 16 % — ABNORMAL HIGH (ref 11.5–15.5)
WBC: 12.8 K/uL — ABNORMAL HIGH (ref 4.0–10.5)
nRBC: 0 % (ref 0.0–0.2)

## 2024-11-16 LAB — BASIC METABOLIC PANEL WITH GFR
Anion gap: 8 (ref 5–15)
BUN: 17 mg/dL (ref 8–23)
CO2: 26 mmol/L (ref 22–32)
Calcium: 9.2 mg/dL (ref 8.9–10.3)
Chloride: 102 mmol/L (ref 98–111)
Creatinine, Ser: 1.43 mg/dL — ABNORMAL HIGH (ref 0.61–1.24)
GFR, Estimated: 49 mL/min — ABNORMAL LOW (ref >60)
Glucose, Bld: 102 mg/dL — ABNORMAL HIGH (ref 70–99)
Potassium: 4 mmol/L (ref 3.5–5.1)
Sodium: 135 mmol/L (ref 135–145)

## 2024-11-16 MED ORDER — TRIMETHOPRIM 100 MG PO TABS: 100.0000 mg | ORAL_TABLET | Freq: Every day | ORAL | 0 refills | Status: AC

## 2024-11-16 NOTE — Progress Notes (Addendum)
   Attending attestation - seen with Cam on afternoon rounds. Urine clear. D/c with foley. Continue finasteride  and tamsulosin . Outpt void trial in about 2 weeks. I'll keep him on a nightly trimehtoprim.   Subjective: Travis Woodward was in some distress this morning, struggling with with severe bladder spasm since last night.  CBI on very low gtt.  Clear yellow urine.  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  Technically challenging case and only right prostatic artery was able to be embolized.    Continue finasteride  and Flomax .  Could consider Firmagon if severe bleeding recurs.  CBI clamped. Very light blushing of hematuria while having bowel movement this am  Levsin Q4 PRN for spasm.   Restarted Eliquis . OK to discharge home once medically clear.  Intake/Output from previous day: 11/19 0701 - 11/20 0700 In: 720 [P.O.:720] Out: 2100 [Urine:2100]  Intake/Output this shift: Total I/O In: 240 [P.O.:240] Out: -   Physical Exam:  General: Alert and oriented CV: No cyanosis Lungs: equal chest rise Abdomen: Soft, NTND, no rebound or guarding Gu: Three-way Foley catheter in place, plugged, with lightly blood tinged urine  Lab Results: Recent Labs    11/14/24 0853 11/15/24 1200 11/16/24 0547  HGB 9.6* 8.8* 8.5*  HCT 30.1* 27.4* 26.6*   BMET Recent Labs    11/15/24 1027 11/15/24 1200 11/16/24 0547  NA 137  --  135  K 3.9  --  4.0  CL 103  --  102  CO2 25  --  26  GLUCOSE 132*  --  102*  BUN 19  --  17  CREATININE 1.41*  --  1.43*  CALCIUM 9.2  --  9.2  HGB  --  8.8* 8.5*  WBC  --  12.8* 12.8*      Studies/Results: No results found.      LOS: 8 days   [name] Bourdon, NP [facility] Pager: [phone]  11/16/2024, 10:25 AM

## 2024-11-16 NOTE — Progress Notes (Signed)
 PROGRESS NOTE    Travis Woodward.  FMW:[id number] DOB: March 26, 1941 DOA: 11/08/2024 PCP: Orpha Yancey LABOR, MD    Chief Complaint  Patient presents with   Hematuria    Brief Narrative:  83 year old with past medical history significant for hypertension, CKD stage IIIa, BPH presents to the ED with hematuria. Of note patient was recently admitted at [facility] facility for management of hematuria. He was on Eliquis  which was held. Patient underwent cystoscopy with clot evacuation and fulguration by Dr. Guerron on 10/29/2024. Urology consulted and recommended placing a three-way Foley catheter, patient was transferred to [facility] for further evaluation. He was still taking Eliquis .    Assessment & Plan:   Principal Problem:   Hematuria Active Problems:   CAD S/P percutaneous coronary angioplasty--09/03/2004   Hyperlipidemia   Anemia   Thrombocytopenia   Hypertension   Coronary artery disease without angina pectoris   Hematuria of unknown cause   Benign prostatic hyperplasia with lower urinary tract symptoms   Stage 3a chronic kidney disease (HCC)   History of DVT (deep vein thrombosis)   Constipation   Other chronic pain  #1 recurrent hematuria/UTI related to chronic catheter/BPH LUTS - Patient noted with recurrent hematuria felt aggravated in the setting of anticoagulation of Eliquis  and aspirin . - Three-way Foley catheter in place with clear urine this morning. - Urinalysis grew > 100,000 colonies of Morganella Morgagni sensitive to ciprofloxacin . - Urology consulted recommended IR involvement for embolization of prostatic artery. - Patient seen by IR and underwent pelvic arteriography, right prostate artery embolization on 11/13/2024. - During hospitalization Foley catheter noted to have been displaced causing a lot of pain, discomfort and hematuria the morning of 11/14/2024. - Patient seen by urology on 11/15/2024, Foley catheter manipulated with improvement with pain. -  Continue finasteride , Flomax . - Status post 7 days ciprofloxacin .  - Urology following. - Urology recommending continuation of Levsin every 4 hours as needed for bladder spasms. - Urology cleared patient for trial of anticoagulation which was started 11/15/2024.   -CBI clamped, per urology patient with very light blotching of hematuria while having a bowel movement this morning. - Monitor on anticoagulation with Eliquis  for another 24 hours and if no further hematuria will discharge home with outpatient follow-up with urology.   - Per urology.  2.  Acute blood loss anemia secondary to hematuria -Hemoglobin currently at 8.5 this morning. - Follow H&H. - Transfusion threshold hemoglobin < 8.  3.  Hypertension - BP stable. - IV hydralazine  as needed.  4.  BPH - Continue Flomax .  5.  CKD 3A -Baseline creatinine 1.2-1.4. - Patient on admission to have a creatinine of 1.5. - Creatinine stable at 1.43 today.  6.  Thrombocytopenia -Likely secondary to acute infection and acute blood loss anemia. - Follow-up.  7.  History of DVT/status post penumbra thrombectomy and right common iliac vein stenting - Eliquis  resumed on 11/15/2024.   8.  History of CAD status post stent to the LAD -Patient on baby aspirin  which is currently on hold. - Eliquis  resumed.  - Continue to hold aspirin  and likely will not resume on discharge due to presentations with recurrent hematuria. - Outpatient follow-up with cardiology.  9.  Chronic pain -Continue oxycodone  15 mg 3 times daily as needed.  10.  Constipation  - Continue current bowel regimen of MiraLAX  twice daily, Senokot-S twice daily, Dulcolax suppository daily.   11.  Pressure injury, POA Wound 11/08/24 1109 Pressure Injury Buttocks Left Deep Tissue Pressure Injury -  Purple or maroon localized area of discolored intact skin or blood-filled blister due to damage of underlying soft tissue from pressure and/or shear. (Active)    DVT prophylaxis:  Eliquis  Code Status: Full Family Communication: Updated patient.  No family at bedside. Disposition: Home when clinically improved, no further hematuria with initiation of anticoagulation, and when cleared by urology hopefully in the next 24 hours.  Status is: Inpatient Remains inpatient appropriate because: Severity of illness   Consultants:  Wound care RN 11/08/2024 Urology: Dr. Elisabeth 11/08/2024 Interventional radiology: Dr. Luverne 11/11/2024  Procedures:  CT renal stone protocol 11/08/2024 CT angiogram pelvis 11/10/2024 Pelvic arteriography/right prostate artery embolization per IR: Dr.Mugweru 11/13/2024  Antimicrobials:  Anti-infectives (From admission, onward)    Start     Dose/Rate Route Frequency Ordered Stop   11/13/24 1400  cefTRIAXone  (ROCEPHIN ) 2 g in sodium chloride  0.9 % 100 mL IVPB        2 g 200 mL/hr over 30 Minutes Intravenous  Once 11/13/24 0830 11/13/24 1530   11/09/24 1030  ciprofloxacin  (CIPRO ) tablet 500 mg  Status:  Discontinued        500 mg Oral Every 12 hours 11/09/24 1017 11/16/24 0952         Subjective: Patient laying in bed.  Denies any chest pain, no shortness of breath, no abdominal pain.  Foley catheter with clear urine.   Patient denies any overt bleeding.  States he was seen by urology early on.    Objective: Vitals:   11/15/24 1323 11/15/24 1452 11/15/24 2135 11/16/24 0555  BP: (!) 104/58 (!) 148/68 107/60 (!) 116/57  Pulse: 91 83 80 76  Resp: 17 20 20 18   Temp: 98.3 F (36.8 C) 98.8 F (37.1 C) 98.9 F (37.2 C) 99.1 F (37.3 C)  TempSrc: Oral Oral Oral Oral  SpO2: 96% 98% 97% 94%  Weight:      Height:        Intake/Output Summary (Last 24 hours) at 11/16/2024 1143 Last data filed at 11/16/2024 0855 Gross per 24 hour  Intake 600 ml  Output 2100 ml  Net -1500 ml   Filed Weights   11/08/24 0224  Weight: 69.9 kg    Examination:  General exam: NAD. Respiratory system: Lungs clear to auscultation bilaterally.  No  wheezes, no crackles, no rhonchi.  Fair air movement.  Speaking in full sentences.  Cardiovascular system: Regular rate rhythm no murmurs rubs or gallops.  No JVD.  No lower extremity edema.  Gastrointestinal system: Abdomen is soft, nontender, nondistended, positive bowel sounds.  No rebound.  No guarding.  Central nervous system: Alert and oriented. No focal neurological deficits. Extremities: Symmetric 5 x 5 power. Skin: No rashes, lesions or ulcers Psychiatry: Judgement and insight appear normal. Mood & affect appropriate.     Data Reviewed: I have personally reviewed following labs and imaging studies  CBC: Recent Labs  Lab 11/12/24 0930 11/13/24 0526 11/14/24 0853 11/15/24 1200 11/16/24 0547  WBC 13.7* 9.9 9.4 12.8* 12.8*  NEUTROABS  --   --   --  10.3* 9.6*  HGB 10.5* 9.3* 9.6* 8.8* 8.5*  HCT 33.7* 29.2* 30.1* 27.4* 26.6*  MCV 97.4 97.0 97.1 96.1 96.7  PLT 130* 106* 103* 98* 95*    Basic Metabolic Panel: Recent Labs  Lab 11/12/24 0930 11/13/24 0526 11/14/24 0853 11/15/24 1027 11/16/24 0547  NA 141 140 139 137 135  K 3.8 4.9 4.4 3.9 4.0  CL 105 106 104 103 102  CO2 24 27 25  25 26  GLUCOSE 125* 96 140* 132* 102*  BUN 18 16 17 19 17   CREATININE 1.42* 1.20 1.40* 1.41* 1.43*  CALCIUM 9.6 9.2 9.4 9.2 9.2  MG  --   --   --  2.3  --     GFR: Estimated Creatinine Clearance: 38.7 mL/min (A) (by C-G formula based on SCr of 1.43 mg/dL (H)).  Liver Function Tests: Recent Labs  Lab 11/13/24 0526  AST 13*  ALT <5  ALKPHOS 96  BILITOT 0.5  PROT 5.7*  ALBUMIN 3.2*    CBG: No results for input(s): GLUCAP in the last 168 hours.   No results found for this or any previous visit (from the past 240 hours).       Radiology Studies: No results found.       Scheduled Meds:  apixaban   5 mg Oral BID   bisacodyl   10 mg Rectal Daily   Chlorhexidine  Gluconate Cloth  6 each Topical Daily   ferrous sulfate   325 mg Oral Q breakfast   finasteride   5 mg Oral  Daily   folic acid   1 mg Oral Daily   liver oil-zinc oxide   Topical BID   polyethylene glycol  17 g Oral BID   senna-docusate  1 tablet Oral BID   tamsulosin   0.4 mg Oral Daily   Continuous Infusions:  sodium chloride  irrigation       LOS: 8 days    Time spent: 40 minutes    Toribio Hummer, MD [facility]   To contact the attending provider between 7A-7P or the covering provider during after hours 7P-7A, please log into the web site [email or web address] and access using universal [place] password for that web site. If you do not have the password, please call the hospital operator.  11/16/2024, 11:43 AM

## 2024-11-16 NOTE — Progress Notes (Signed)
 Mobility Specialist - Progress Note   11/16/24 1049  Mobility  Activity Ambulated with assistance  Level of Assistance Modified independent, requires aide device or extra time  Assistive Device Front wheel walker  Distance Ambulated (ft) 500 ft  Range of Motion/Exercises Active  Activity Response Tolerated fair  Mobility visit 1 Mobility  Mobility Specialist Start Time (ACUTE ONLY) 1037  Mobility Specialist Stop Time (ACUTE ONLY) 1049  Mobility Specialist Time Calculation (min) (ACUTE ONLY) 12 min   Pt was found in bed and agreeable to mobilize. C/o bladder spasm at beginning of session. At EOS returned to bed with all needs met. Call bell in reach.   Erminio Leos,  Mobility Specialist Can be reached via Secure Chat

## 2024-11-17 ENCOUNTER — Telehealth: Payer: Self-pay

## 2024-11-17 LAB — BASIC METABOLIC PANEL WITH GFR
Anion gap: 7 (ref 5–15)
BUN: 15 mg/dL (ref 8–23)
CO2: 27 mmol/L (ref 22–32)
Calcium: 9.3 mg/dL (ref 8.9–10.3)
Chloride: 99 mmol/L (ref 98–111)
Creatinine, Ser: 1.35 mg/dL — ABNORMAL HIGH (ref 0.61–1.24)
GFR, Estimated: 52 mL/min — ABNORMAL LOW (ref >60)
Glucose, Bld: 97 mg/dL (ref 70–99)
Potassium: 4 mmol/L (ref 3.5–5.1)
Sodium: 134 mmol/L — ABNORMAL LOW (ref 135–145)

## 2024-11-17 LAB — CBC WITH DIFFERENTIAL/PLATELET
Abs Immature Granulocytes: 0.02 K/uL (ref 0.00–0.07)
Basophils Absolute: 0.1 K/uL (ref 0.0–0.1)
Basophils Relative: 1 %
Eosinophils Absolute: 0.2 K/uL (ref 0.0–0.5)
Eosinophils Relative: 3 %
HCT: 27.7 % — ABNORMAL LOW (ref 39.0–52.0)
Hemoglobin: 8.7 g/dL — ABNORMAL LOW (ref 13.0–17.0)
Immature Granulocytes: 0 %
Lymphocytes Relative: 16 %
Lymphs Abs: 1.3 K/uL (ref 0.7–4.0)
MCH: 30.2 pg (ref 26.0–34.0)
MCHC: 31.4 g/dL (ref 30.0–36.0)
MCV: 96.2 fL (ref 80.0–100.0)
Monocytes Absolute: 0.9 K/uL (ref 0.1–1.0)
Monocytes Relative: 11 %
Neutro Abs: 5.5 K/uL (ref 1.7–7.7)
Neutrophils Relative %: 69 %
Platelets: 100 K/uL — ABNORMAL LOW (ref 150–400)
RBC: 2.88 MIL/uL — ABNORMAL LOW (ref 4.22–5.81)
RDW: 15.6 % — ABNORMAL HIGH (ref 11.5–15.5)
WBC: 7.9 K/uL (ref 4.0–10.5)
nRBC: 0 % (ref 0.0–0.2)

## 2024-11-17 MED ORDER — HYOSCYAMINE SULFATE 0.125 MG PO TBDP: 0.1250 mg | ORAL_TABLET | ORAL | 0 refills | Status: AC | PRN

## 2024-11-17 MED ORDER — ACETAMINOPHEN 500 MG PO TABS: 500.0000 mg | ORAL_TABLET | Freq: Four times a day (QID) | ORAL | Status: AC | PRN

## 2024-11-17 MED ORDER — POLYETHYLENE GLYCOL 3350 17 G PO PACK: 17.0000 g | PACK | Freq: Two times a day (BID) | ORAL | Status: AC

## 2024-11-17 MED ORDER — FERROUS SULFATE 325 (65 FE) MG PO TABS: 325.0000 mg | ORAL_TABLET | Freq: Every day | ORAL | Status: AC

## 2024-11-17 MED ORDER — ZINC OXIDE 40 % EX OINT: TOPICAL_OINTMENT | Freq: Two times a day (BID) | CUTANEOUS | 0 refills | Status: AC

## 2024-11-17 MED ORDER — SENNOSIDES-DOCUSATE SODIUM 8.6-50 MG PO TABS: 1.0000 | ORAL_TABLET | Freq: Two times a day (BID) | ORAL | Status: AC

## 2024-11-17 MED ORDER — FOLIC ACID 1 MG PO TABS: 1.0000 mg | ORAL_TABLET | Freq: Every day | ORAL | Status: AC

## 2024-11-17 NOTE — Discharge Summary (Signed)
 Physician Discharge Summary  Travis Woodward. FMW:[id number] DOB: July 05, 1941 DOA: 11/08/2024  PCP: Travis Yancey LABOR, MD  Admit date: 11/08/2024 Discharge date: 11/17/2024  Time spent: 60 minutes  Recommendations for Outpatient Follow-up:  Follow-up with [facility] in 1 to 2 weeks.  Office will call with appointment time. Follow-up with Travis Yancey LABOR, MD in 2 weeks.  On follow-up patient will need a basic metabolic profile done to follow-up on electrolytes and renal function.  Patient will need a CBC done to follow-up on counts.  Patient's aspirin  was held on discharge due to recurrent hematuria and will be deferred to PCP versus cardiology if it needs to be resumed.   Discharge Diagnoses:  Principal Problem:   Hematuria Active Problems:   CAD S/P percutaneous coronary angioplasty--09/03/2004   Hyperlipidemia   Anemia   Thrombocytopenia   Hypertension   Coronary artery disease without angina pectoris   Hematuria of unknown cause   Benign prostatic hyperplasia with lower urinary tract symptoms   Stage 3a chronic kidney disease (HCC)   History of DVT (deep vein thrombosis)   Constipation   Other chronic pain   Discharge Condition: Stable and improved.  Diet recommendation: Heart healthy  Filed Weights   11/08/24 0224  Weight: 69.9 kg    History of present illness:  HPI per Dr. Dino Travis Woodward. is a 83 y.o. male with medical history significant of multiple comorbidities including essential hypertension, CKD stage IIIa, BPH, who was seen in the emergency room because of hematuria.  Of note, patient was recently admitted at [facility] facility for the management of hematuria.  Patient was on Eliquis  which was held.  Patient underwent cystoscopy with clot evacuation and fulguration with Dr. Guerron on 10/29/2024.  [facility] follows up with [facility], Dr. Nieves and has a history of urinary retention as well as benign prostatic hyperplasia.  At [facility]  emergency room, urologist was consulted who recommending placing three-way Foley catheter and transferring the patient to [facility] for further management of hematuria.   I discussed the case with Travis Woodward, nurse practitioner, urology.  Patient will be transferred to [facility] for further management of hematuria, needing urology consultation and close hemoglobin and hematocrit monitoring.  Hospital Course:  #1 recurrent hematuria/UTI related to chronic catheter/BPH LUTS - Patient noted with recurrent hematuria felt aggravated in the setting of anticoagulation of Eliquis  and aspirin . - Three-way Foley catheter in place with clear urine this morning. - Urinalysis grew > 100,000 colonies of Travis Woodward sensitive to ciprofloxacin . - Urology consulted recommended IR involvement for embolization of prostatic artery. - Patient seen by IR and underwent pelvic arteriography, right prostate artery embolization on 11/13/2024. - During hospitalization Foley catheter noted to have been displaced causing a lot of pain, discomfort and hematuria the morning of 11/14/2024. - Patient seen by urology on 11/15/2024, Foley catheter manipulated with improvement with pain. - Patient maintained on home regimen finasteride , Flomax . - Status post 7 days ciprofloxacin .  - Urology followed the patient throughout the hospitalization. - Urology recommended continuation of Levsin  every 4 hours as needed for bladder spasms. - Urology cleared patient for trial of anticoagulation which was started 11/15/2024.   -CBI clamped, per urology patient with very light blotching of hematuria while having a bowel movement the morning of 11/16/2024 and cleared up during the day.  -Eliquis  resumed and patient did not have any recurrent gross hematuria. -We discharged with Foley catheter in place with outpatient follow-up  with urology for voiding trial and hospital follow-up.   2.  Acute blood loss anemia  secondary to hematuria -Hemoglobin remained stable at 8.7.   - Eliquis  resumed and hemoglobin remained stable. - Outpatient follow-up with PCP.    3.  Hypertension - BP stable.   4.  BPH - Patient maintained on home regimen Flomax .   5.  CKD 3A -Baseline creatinine 1.2-1.4. - Patient on admission to have a creatinine of 1.5. - Creatinine stable at 1.35 on day of discharge.    6.  Thrombocytopenia -Likely secondary to acute infection and acute blood loss anemia. - Remained stable during the hospitalization and after resumption of Eliquis .   - Aspirin  held during the hospitalization and will be held on discharge.  -Outpatient follow-up with PCP.    7.  History of DVT/status post penumbra thrombectomy and right common iliac vein stenting - Eliquis  resumed on 11/15/2024.  -No noted significant recurrent hematuria. -Outpatient follow-up with PCP.   8.  History of CAD status post stent to the LAD -Patient on baby aspirin  which was held during the hospitalization.  - Eliquis  resumed.  - Aspirin  held on discharge due to presentation with multiple episodes of recurrent hematuria.   - Outpatient follow-up with cardiology as previously prescribed.     9.  Chronic pain - Patient maintained on home regimen oxycodone  15 mg 3 times daily as needed.   10.  Constipation  - Patient maintained on bowel regimen of MiraLAX  twice daily, Senokot-S twice daily, Dulcolax suppository daily during the hospitalization.   - Patient will be discharged home on a bowel regimen of MiraLAX  twice daily, Senokot-S twice daily.   - Outpatient follow-up with PCP.     11.  Pressure injury, POA Wound 11/08/24 1109 Pressure Injury Buttocks Left Deep Tissue Pressure Injury - Purple or maroon localized area of discolored intact skin or blood-filled blister due to damage of underlying soft tissue from pressure and/or shear. (Active)     Procedures: CT renal stone protocol 11/08/2024 CT angiogram pelvis  11/10/2024 Pelvic arteriography/right prostate artery embolization per IR: Dr.Mugweru 11/13/2024  Consultations: Wound care RN 11/08/2024 Urology: Dr. Elisabeth 11/08/2024 Interventional radiology: Dr. Luverne 11/11/2024  Discharge Exam: Vitals:   11/17/24 0550 11/17/24 1356  BP: (!) 109/57 (!) 107/58  Pulse: 70 78  Resp: 17 16  Temp: 98.9 F (37.2 C) 99.1 F (37.3 C)  SpO2: 98% 97%    General: NAD Cardiovascular: RRR no murmurs rubs or gallops.  No JVD.  No lower extremity edema. Respiratory: Clear to auscultation bilaterally.  No wheezes, no crackles, no rhonchi.  Fair air movement.  Speaking in full sentences.  No use of accessory muscles of respiration.  Discharge Instructions   Discharge Instructions     Diet - low sodium heart healthy   Complete by: As directed    Increase activity slowly   Complete by: As directed    No wound care   Complete by: As directed       Allergies as of 11/17/2024       Reactions   Crestor [rosuvastatin] Other (See Comments)   Unknown    Paroxetine Rash        Medication List     PAUSE taking these medications    aspirin  EC 81 MG tablet Wait to take this until your doctor or other care provider tells you to start again. Take 81 mg by mouth daily.   sulfamethoxazole -trimethoprim  800-160 MG tablet Wait to take this until your  doctor or other care provider tells you to start again. Commonly known as: BACTRIM  DS Take 1 tablet by mouth 2 (two) times daily.       STOP taking these medications    cephALEXin  500 MG capsule Commonly known as: KEFLEX    docusate sodium  100 MG capsule Commonly known as: COLACE       TAKE these medications    acetaminophen  500 MG tablet Commonly known as: TYLENOL  Take 1 tablet (500 mg total) by mouth every 6 (six) hours as needed for mild pain (pain score 1-3), fever or headache.   apixaban  5 MG Tabs tablet Commonly known as: ELIQUIS  Take 1 tablet (5 mg total) by mouth 2 (two) times  daily.   ferrous sulfate  325 (65 FE) MG tablet Take 1 tablet (325 mg total) by mouth daily with breakfast. Start taking on: November 18, 2024   finasteride  5 MG tablet Commonly known as: PROSCAR  Take 1 tablet (5 mg total) by mouth daily.   folic acid  1 MG tablet Commonly known as: FOLVITE  Take 1 tablet (1 mg total) by mouth daily. Start taking on: November 18, 2024   hyoscyamine  0.125 MG Tbdp disintergrating tablet Commonly known as: ANASPAZ  Take 1 tablet (0.125 mg total) by mouth every 4 (four) hours as needed for bladder spasms or cramping.   liver oil-zinc  oxide 40 % ointment Commonly known as: DESITIN Apply topically 2 (two) times daily. Apply to buttock; gluteal fold, and perineal areas   nitroGLYCERIN  0.4 MG SL tablet Commonly known as: NITROSTAT  Place 0.4 mg under the tongue every 5 (five) minutes as needed for chest pain.   ondansetron  4 MG tablet Commonly known as: ZOFRAN  Take 1 tablet (4 mg total) by mouth every 6 (six) hours as needed for nausea.   oxyCODONE  15 MG immediate release tablet Commonly known as: ROXICODONE  Take 15 mg by mouth every 8 (eight) hours as needed for pain.   polyethylene glycol 17 g packet Commonly known as: MIRALAX  / GLYCOLAX  Take 17 g by mouth 2 (two) times daily. What changed: when to take this   senna-docusate 8.6-50 MG tablet Commonly known as: Senokot-S Take 1 tablet by mouth 2 (two) times daily.   tamsulosin  0.4 MG Caps capsule Commonly known as: FLOMAX  Take 1 capsule (0.4 mg total) by mouth daily.   trimethoprim  100 MG tablet Commonly known as: TRIMPEX  Take 1 tablet (100 mg total) by mouth at bedtime.   VITAMIN B-12 IJ Inject 1 mL as directed every 30 (thirty) days.   VITAMIN D  HIGH POTENCY PO Take 5,000 Units by mouth daily.       Allergies  Allergen Reactions   Crestor [Rosuvastatin] Other (See Comments)    Unknown    Paroxetine Rash    Follow-up Information     Hasanaj, Yancey LABOR, MD. Schedule an  appointment as soon as possible for a visit in 2 week(s).   Specialty: Internal Medicine Contact information: [address] [place] [address] [phone]         [facility]. Schedule an appointment as soon as possible for a visit in 2 week(s).   Why: Office will call with appointment time. Follow-up in 1 to 2 weeks. Contact information: [address] [place]  [address] [phone]                 The results of significant diagnostics from this hospitalization (including imaging, microbiology, ancillary and laboratory) are listed below for reference.  Significant Diagnostic Studies: IR EMBO ART  VEN HEMORR LYMPH EXTRAV  INC GUIDE ROADMAPPING Result Date: 11/14/2024 INDICATION: Prostatic artery embolization. Hematuria. 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 EXAM: Title; RIGHT PROSTATE ARTERY EMBOLIZATION Listed procedures: 1. ULTRASOUND-GUIDED LEFT RADIAL ARTERY ACCESS 2. THORACIC AORTOGRAPHY 3. PELVIC ARTERIOGRAPHY 4. CONE BEAM CT ANGIOGRAPHY of the PELVIS 5. PROSTATE ARTERY EMBOLIZATION, RIGHT MEDICATIONS: Rocephin  2 gm IV. The antibiotic was administered within one hour of the procedure 50 mg Benadryl  IV.  30 mg Toradol  IV.  500 mg Robaxin  IV ANESTHESIA/SEDATION: Moderate (conscious) sedation was employed during this procedure. A total of Versed  6 mg and Fentanyl  300 mcg was administered intravenously. Moderate Sedation Time: 130 minutes. The patient's level of consciousness and vital signs were monitored continuously by radiology nursing throughout the procedure under my direct supervision. CONTRAST:  20mL OMNIPAQUE  IOHEXOL  300 MG/ML SOLN, 4mL OMNIPAQUE  IOHEXOL  300 MG/ML SOLN, 70mL OMNIPAQUE  IOHEXOL  300 MG/ML SOLN, 30mL OMNIPAQUE  IOHEXOL  300 MG/ML SOLN FLUOROSCOPY: Radiation Exposure Index and estimated peak skin dose (PSD); Reference air kerma (RAK), 914 mGy. COMPLICATIONS: None immediate. PROCEDURE:  Informed consent was obtained from the patient following explanation of the procedure, risks, benefits and alternatives. The patient understands, agrees and consents for the procedure. All questions were addressed. A time out was performed prior to the initiation of the procedure. Maximal barrier sterile technique utilized including caps, mask, sterile gowns, sterile gloves, large sterile drape, hand hygiene, and sterile prep. RADIAL ACCESS A preliminary ultrasound of the LEFT wrist was performed and demonstrates a patent radial artery. A permanent ultrasound image was recorded. The radial artery was measured for adequate size and a Barbeau test was performed deemed in the LEFT radial artery appropriate vascular access. The overlying skin was anesthetized with 1% lidocaine . Using ultrasound guidance, access into the LEFT radial artery was obtained with visualization of needle entry into the vessel using standard micropuncture technique. A 5 French slender sheath was placed without complication. A standard radial cocktail was slowly administered. A 5 Fr Terumo Ravi MG-2 catheter and a Bentson wire were directed under direct fluoroscopic guidance through the LEFT upper extremity and to the LEFT subclavian artery and aortic arch. Secondary to tortuosity, there was difficulty in cannulating the descending thoracic aorta. A thoracic aortogram was performed to evaluate the anatomy. The catheter was used to select the LEFT internal iliac artery then cone beam CT angiogram was performed. There was significant tortuosity of the vessels within the within the LEFT pelvis and the LEFT prostatic artery could not be cannulated despite considerable effort. Embolization was not performed. The 5 Fr catheter was retracted then inserted into the contralateral/RIGHT common iliac artery and subsequently the internal iliac artery. Cone beam CT angiogram was performed, with post processing performed at a separate workstation.  Additionally, selective RIGHT prostatic arteriogram was performed followed by catheterization with a 2.0 Fr, 175 cm [employer] TruSelect cm and 0.014 inch synchro soft micro guide wire. Access was adequate for embolization. Embolization was performed with 300-500 um embosphere micro particles. Post embolization angiogram confirms stasis of the RIGHT prostatic vascular territory. Coil embolization of the RIGHT prostatic artery origin was also performed. CONE BEAM CT ANGIOGRAPHY: To utilize vascular tracking software, a cone beam CTA was then performed from the RIGHT internal iliac artery, and post processing was performed at a separate workstation. PURPOSE OF THE ARTERIOGRAMS: No previous catheter-directed angiogram was available. Therefore a new complete diagnostic angiography was performed. The decision to proceed with an interventional procedure was made  based on this new diagnostic angiogram. At this point, all wires, catheters and sheaths were removed from the patient. A TR band was placed over the LEFT wrist and after insufflation the sheath was removed. The TR band balloon was slightly deflated and then re-inflated to achieve patent hemostasis. The patient tolerated the procedure well without immediate complication was transferred to the recovery area in good condition. FINDINGS: *Tortuous thoracic aorta, with aortogram performed to aid in catheter tracking. *Diminutive, tortuous LEFT prostatic artery with difficulty in cannulation. Embolization was not performed on this side. *Dominant RIGHT prostatic artery with successful microparticle and additional coil embolization of the RIGHT prostatic artery origin. IMPRESSION: 1. Successful RIGHT prostatic artery microparticle embolization, with additional coil embolization prostatic artery origin, via transradial approach. 2. The diminutive LEFT prostatic artery could not be cannulated despite considerable effort. Embolization was not performed on this side.  PLAN: The patient will be seen in the Vascular Interventional Radiology (VIR) for post-procedure follow-up evaluation in 1 month. Thom Hall, MD [facility] Radiology Electronically Signed   By: Thom Hall M.D.   On: 11/14/2024 16:38   IR US  Guide Vasc Access Left Result Date: 11/14/2024 INDICATION: Prostatic artery embolization. Hematuria. 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 EXAM: Title; RIGHT PROSTATE ARTERY EMBOLIZATION Listed procedures: 1. ULTRASOUND-GUIDED LEFT RADIAL ARTERY ACCESS 2. THORACIC AORTOGRAPHY 3. PELVIC ARTERIOGRAPHY 4. CONE BEAM CT ANGIOGRAPHY of the PELVIS 5. PROSTATE ARTERY EMBOLIZATION, RIGHT MEDICATIONS: Rocephin  2 gm IV. The antibiotic was administered within one hour of the procedure 50 mg Benadryl  IV.  30 mg Toradol  IV.  500 mg Robaxin  IV ANESTHESIA/SEDATION: Moderate (conscious) sedation was employed during this procedure. A total of Versed  6 mg and Fentanyl  300 mcg was administered intravenously. Moderate Sedation Time: 130 minutes. The patient's level of consciousness and vital signs were monitored continuously by radiology nursing throughout the procedure under my direct supervision. CONTRAST:  20mL OMNIPAQUE  IOHEXOL  300 MG/ML SOLN, 4mL OMNIPAQUE  IOHEXOL  300 MG/ML SOLN, 70mL OMNIPAQUE  IOHEXOL  300 MG/ML SOLN, 30mL OMNIPAQUE  IOHEXOL  300 MG/ML SOLN FLUOROSCOPY: Radiation Exposure Index and estimated peak skin dose (PSD); Reference air kerma (RAK), 914 mGy. COMPLICATIONS: None immediate. PROCEDURE: Informed consent was obtained from the patient following explanation of the procedure, risks, benefits and alternatives. The patient understands, agrees and consents for the procedure. All questions were addressed. A time out was performed prior to the initiation of the procedure. Maximal barrier sterile technique utilized including caps, mask, sterile gowns, sterile gloves, large sterile drape, hand hygiene,  and sterile prep. RADIAL ACCESS A preliminary ultrasound of the LEFT wrist was performed and demonstrates a patent radial artery. A permanent ultrasound image was recorded. The radial artery was measured for adequate size and a Barbeau test was performed deemed in the LEFT radial artery appropriate vascular access. The overlying skin was anesthetized with 1% lidocaine . Using ultrasound guidance, access into the LEFT radial artery was obtained with visualization of needle entry into the vessel using standard micropuncture technique. A 5 French slender sheath was placed without complication. A standard radial cocktail was slowly administered. A 5 Fr Terumo Ravi MG-2 catheter and a Bentson wire were directed under direct fluoroscopic guidance through the LEFT upper extremity and to the LEFT subclavian artery and aortic arch. Secondary to tortuosity, there was difficulty in cannulating the descending thoracic aorta. A thoracic aortogram was performed to evaluate the anatomy. The catheter was used to select the LEFT internal iliac artery then cone beam CT angiogram was performed.  There was significant tortuosity of the vessels within the within the LEFT pelvis and the LEFT prostatic artery could not be cannulated despite considerable effort. Embolization was not performed. The 5 Fr catheter was retracted then inserted into the contralateral/RIGHT common iliac artery and subsequently the internal iliac artery. Cone beam CT angiogram was performed, with post processing performed at a separate workstation. Additionally, selective RIGHT prostatic arteriogram was performed followed by catheterization with a 2.0 Fr, 175 cm [employer] TruSelect cm and 0.014 inch synchro soft micro guide wire. Access was adequate for embolization. Embolization was performed with 300-500 um embosphere micro particles. Post embolization angiogram confirms stasis of the RIGHT prostatic vascular territory. Coil embolization of the RIGHT  prostatic artery origin was also performed. CONE BEAM CT ANGIOGRAPHY: To utilize vascular tracking software, a cone beam CTA was then performed from the RIGHT internal iliac artery, and post processing was performed at a separate workstation. PURPOSE OF THE ARTERIOGRAMS: No previous catheter-directed angiogram was available. Therefore a new complete diagnostic angiography was performed. The decision to proceed with an interventional procedure was made based on this new diagnostic angiogram. At this point, all wires, catheters and sheaths were removed from the patient. A TR band was placed over the LEFT wrist and after insufflation the sheath was removed. The TR band balloon was slightly deflated and then re-inflated to achieve patent hemostasis. The patient tolerated the procedure well without immediate complication was transferred to the recovery area in good condition. FINDINGS: *Tortuous thoracic aorta, with aortogram performed to aid in catheter tracking. *Diminutive, tortuous LEFT prostatic artery with difficulty in cannulation. Embolization was not performed on this side. *Dominant RIGHT prostatic artery with successful microparticle and additional coil embolization of the RIGHT prostatic artery origin. IMPRESSION: 1. Successful RIGHT prostatic artery microparticle embolization, with additional coil embolization prostatic artery origin, via transradial approach. 2. The diminutive LEFT prostatic artery could not be cannulated despite considerable effort. Embolization was not performed on this side. PLAN: The patient will be seen in the Vascular Interventional Radiology (VIR) for post-procedure follow-up evaluation in 1 month. Thom Hall, MD [facility] Radiology Electronically Signed   By: Thom Hall M.D.   On: 11/14/2024 16:38   IR Aorta/Thoracic Result Date: 11/14/2024 INDICATION: Prostatic artery embolization. Hematuria. History of anticoagulation  (Eliquis ) for RIGHT lower extremity DVT. EXAM: Title; RIGHT PROSTATE ARTERY EMBOLIZATION Listed procedures: 1. ULTRASOUND-GUIDED LEFT RADIAL ARTERY ACCESS 2. THORACIC AORTOGRAPHY 3. PELVIC ARTERIOGRAPHY 4. CONE BEAM CT ANGIOGRAPHY of the PELVIS 5. PROSTATE ARTERY EMBOLIZATION, RIGHT MEDICATIONS: Rocephin  2 gm IV. The antibiotic was administered within one hour of the procedure 50 mg Benadryl  IV.  30 mg Toradol  IV.  500 mg Robaxin  IV ANESTHESIA/SEDATION: Moderate (conscious) sedation was employed during this procedure. A total of Versed  6 mg and Fentanyl  300 mcg was administered intravenously. Moderate Sedation Time: 130 minutes. The patient's level of consciousness and vital signs were monitored continuously by radiology nursing throughout the procedure under my direct supervision. CONTRAST:  20mL OMNIPAQUE  IOHEXOL  300 MG/ML SOLN, 4mL OMNIPAQUE  IOHEXOL  300 MG/ML SOLN, 70mL OMNIPAQUE  IOHEXOL  300 MG/ML SOLN, 30mL OMNIPAQUE  IOHEXOL  300 MG/ML SOLN FLUOROSCOPY: Radiation Exposure Index and estimated peak skin dose (PSD); Reference air kerma (RAK), 914 mGy. COMPLICATIONS: None immediate. PROCEDURE: Informed consent was obtained from the patient following explanation of the procedure, risks, benefits and alternatives. The patient understands, agrees and consents for the procedure. All questions were addressed. A time out was performed prior to the initiation of the procedure. Maximal  barrier sterile technique utilized including caps, mask, sterile gowns, sterile gloves, large sterile drape, hand hygiene, and sterile prep. RADIAL ACCESS A preliminary ultrasound of the LEFT wrist was performed and demonstrates a patent radial artery. A permanent ultrasound image was recorded. The radial artery was measured for adequate size and a Barbeau test was performed deemed in the LEFT radial artery appropriate vascular access. The overlying skin was anesthetized with 1% lidocaine . Using ultrasound guidance, access into the LEFT radial  artery was obtained with visualization of needle entry into the vessel using standard micropuncture technique. A 5 French slender sheath was placed without complication. A standard radial cocktail was slowly administered. A 5 Fr Terumo Ravi MG-2 catheter and a Bentson wire were directed under direct fluoroscopic guidance through the LEFT upper extremity and to the LEFT subclavian artery and aortic arch. Secondary to tortuosity, there was difficulty in cannulating the descending thoracic aorta. A thoracic aortogram was performed to evaluate the anatomy. The catheter was used to select the LEFT internal iliac artery then cone beam CT angiogram was performed. There was significant tortuosity of the vessels within the within the LEFT pelvis and the LEFT prostatic artery could not be cannulated despite considerable effort. Embolization was not performed. The 5 Fr catheter was retracted then inserted into the contralateral/RIGHT common iliac artery and subsequently the internal iliac artery. Cone beam CT angiogram was performed, with post processing performed at a separate workstation. Additionally, selective RIGHT prostatic arteriogram was performed followed by catheterization with a 2.0 Fr, 175 cm [employer] TruSelect cm and 0.014 inch synchro soft micro guide wire. Access was adequate for embolization. Embolization was performed with 300-500 um embosphere micro particles. Post embolization angiogram confirms stasis of the RIGHT prostatic vascular territory. Coil embolization of the RIGHT prostatic artery origin was also performed. CONE BEAM CT ANGIOGRAPHY: To utilize vascular tracking software, a cone beam CTA was then performed from the RIGHT internal iliac artery, and post processing was performed at a separate workstation. PURPOSE OF THE ARTERIOGRAMS: No previous catheter-directed angiogram was available. Therefore a new complete diagnostic angiography was performed. The decision to proceed with an  interventional procedure was made based on this new diagnostic angiogram. At this point, all wires, catheters and sheaths were removed from the patient. A TR band was placed over the LEFT wrist and after insufflation the sheath was removed. The TR band balloon was slightly deflated and then re-inflated to achieve patent hemostasis. The patient tolerated the procedure well without immediate complication was transferred to the recovery area in good condition. FINDINGS: *Tortuous thoracic aorta, with aortogram performed to aid in catheter tracking. *Diminutive, tortuous LEFT prostatic artery with difficulty in cannulation. Embolization was not performed on this side. *Dominant RIGHT prostatic artery with successful microparticle and additional coil embolization of the RIGHT prostatic artery origin. IMPRESSION: 1. Successful RIGHT prostatic artery microparticle embolization, with additional coil embolization prostatic artery origin, via transradial approach. 2. The diminutive LEFT prostatic artery could not be cannulated despite considerable effort. Embolization was not performed on this side. PLAN: The patient will be seen in the Vascular Interventional Radiology (VIR) for post-procedure follow-up evaluation in 1 month. Thom Hall, MD [facility] Interventional Radiology Special[facility] Radiology Electronically Signed   By: Thom Hall M.D.   On: 11/14/2024 16:38   IR 3D Independent [place] Result Date: 11/14/2024 INDICATION: Prostatic artery embolization. Hematuria. 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 EXAM: Title; RIGHT PROSTATE ARTERY EMBOLIZATION Listed procedures: 1. ULTRASOUND-GUIDED LEFT RADIAL ARTERY ACCESS  2. THORACIC AORTOGRAPHY 3. PELVIC ARTERIOGRAPHY 4. CONE BEAM CT ANGIOGRAPHY of the PELVIS 5. PROSTATE ARTERY EMBOLIZATION, RIGHT MEDICATIONS: Rocephin  2 gm IV. The antibiotic was administered within one hour of the procedure 50 mg Benadryl  IV.  30 mg Toradol  IV.  500  mg Robaxin  IV ANESTHESIA/SEDATION: Moderate (conscious) sedation was employed during this procedure. A total of Versed  6 mg and Fentanyl  300 mcg was administered intravenously. Moderate Sedation Time: 130 minutes. The patient's level of consciousness and vital signs were monitored continuously by radiology nursing throughout the procedure under my direct supervision. CONTRAST:  20mL OMNIPAQUE  IOHEXOL  300 MG/ML SOLN, 4mL OMNIPAQUE  IOHEXOL  300 MG/ML SOLN, 70mL OMNIPAQUE  IOHEXOL  300 MG/ML SOLN, 30mL OMNIPAQUE  IOHEXOL  300 MG/ML SOLN FLUOROSCOPY: Radiation Exposure Index and estimated peak skin dose (PSD); Reference air kerma (RAK), 914 mGy. COMPLICATIONS: None immediate. PROCEDURE: Informed consent was obtained from the patient following explanation of the procedure, risks, benefits and alternatives. The patient understands, agrees and consents for the procedure. All questions were addressed. A time out was performed prior to the initiation of the procedure. Maximal barrier sterile technique utilized including caps, mask, sterile gowns, sterile gloves, large sterile drape, hand hygiene, and sterile prep. RADIAL ACCESS A preliminary ultrasound of the LEFT wrist was performed and demonstrates a patent radial artery. A permanent ultrasound image was recorded. The radial artery was measured for adequate size and a Barbeau test was performed deemed in the LEFT radial artery appropriate vascular access. The overlying skin was anesthetized with 1% lidocaine . Using ultrasound guidance, access into the LEFT radial artery was obtained with visualization of needle entry into the vessel using standard micropuncture technique. A 5 French slender sheath was placed without complication. A standard radial cocktail was slowly administered. A 5 Fr Terumo Ravi MG-2 catheter and a Bentson wire were directed under direct fluoroscopic guidance through the LEFT upper extremity and to the LEFT subclavian artery and aortic arch. Secondary to  tortuosity, there was difficulty in cannulating the descending thoracic aorta. A thoracic aortogram was performed to evaluate the anatomy. The catheter was used to select the LEFT internal iliac artery then cone beam CT angiogram was performed. There was significant tortuosity of the vessels within the within the LEFT pelvis and the LEFT prostatic artery could not be cannulated despite considerable effort. Embolization was not performed. The 5 Fr catheter was retracted then inserted into the contralateral/RIGHT common iliac artery and subsequently the internal iliac artery. Cone beam CT angiogram was performed, with post processing performed at a separate workstation. Additionally, selective RIGHT prostatic arteriogram was performed followed by catheterization with a 2.0 Fr, 175 cm [employer] TruSelect cm and 0.014 inch synchro soft micro guide wire. Access was adequate for embolization. Embolization was performed with 300-500 um embosphere micro particles. Post embolization angiogram confirms stasis of the RIGHT prostatic vascular territory. Coil embolization of the RIGHT prostatic artery origin was also performed. CONE BEAM CT ANGIOGRAPHY: To utilize vascular tracking software, a cone beam CTA was then performed from the RIGHT internal iliac artery, and post processing was performed at a separate workstation. PURPOSE OF THE ARTERIOGRAMS: No previous catheter-directed angiogram was available. Therefore a new complete diagnostic angiography was performed. The decision to proceed with an interventional procedure was made based on this new diagnostic angiogram. At this point, all wires, catheters and sheaths were removed from the patient. A TR band was placed over the LEFT wrist and after insufflation the sheath was removed. The TR band balloon was slightly deflated and then re-inflated to  achieve patent hemostasis. The patient tolerated the procedure well without immediate complication was transferred to the  recovery area in good condition. FINDINGS: *Tortuous thoracic aorta, with aortogram performed to aid in catheter tracking. *Diminutive, tortuous LEFT prostatic artery with difficulty in cannulation. Embolization was not performed on this side. *Dominant RIGHT prostatic artery with successful microparticle and additional coil embolization of the RIGHT prostatic artery origin. IMPRESSION: 1. Successful RIGHT prostatic artery microparticle embolization, with additional coil embolization prostatic artery origin, via transradial approach. 2. The diminutive LEFT prostatic artery could not be cannulated despite considerable effort. Embolization was not performed on this side. PLAN: The patient will be seen in the Vascular Interventional Radiology (VIR) for post-procedure follow-up evaluation in 1 month. Thom Hall, MD Vascular and Interventional Radiology Specialists [facility] Radiology Electronically Signed   By: Thom Hall M.D.   On: 11/14/2024 16:38   IR CT PELVIS W/CM Result Date: 11/14/2024 INDICATION: Prostatic artery embolization. Hematuria. 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 EXAM: Title; RIGHT PROSTATE ARTERY EMBOLIZATION Listed procedures: 1. ULTRASOUND-GUIDED LEFT RADIAL ARTERY ACCESS 2. THORACIC AORTOGRAPHY 3. PELVIC ARTERIOGRAPHY 4. CONE BEAM CT ANGIOGRAPHY of the PELVIS 5. PROSTATE ARTERY EMBOLIZATION, RIGHT MEDICATIONS: Rocephin  2 gm IV. The antibiotic was administered within one hour of the procedure 50 mg Benadryl  IV.  30 mg Toradol  IV.  500 mg Robaxin  IV ANESTHESIA/SEDATION: Moderate (conscious) sedation was employed during this procedure. A total of Versed  6 mg and Fentanyl  300 mcg was administered intravenously. Moderate Sedation Time: 130 minutes. The patient's level of consciousness and vital signs were monitored continuously by radiology nursing throughout the procedure under my direct supervision. CONTRAST:  20mL OMNIPAQUE  IOHEXOL  300 MG/ML SOLN, 4mL OMNIPAQUE   IOHEXOL  300 MG/ML SOLN, 70mL OMNIPAQUE  IOHEXOL  300 MG/ML SOLN, 30mL OMNIPAQUE  IOHEXOL  300 MG/ML SOLN FLUOROSCOPY: Radiation Exposure Index and estimated peak skin dose (PSD); Reference air kerma (RAK), 914 mGy. COMPLICATIONS: None immediate. PROCEDURE: Informed consent was obtained from the patient following explanation of the procedure, risks, benefits and alternatives. The patient understands, agrees and consents for the procedure. All questions were addressed. A time out was performed prior to the initiation of the procedure. Maximal barrier sterile technique utilized including caps, mask, sterile gowns, sterile gloves, large sterile drape, hand hygiene, and sterile prep. RADIAL ACCESS A preliminary ultrasound of the LEFT wrist was performed and demonstrates a patent radial artery. A permanent ultrasound image was recorded. The radial artery was measured for adequate size and a Barbeau test was performed deemed in the LEFT radial artery appropriate vascular access. The overlying skin was anesthetized with 1% lidocaine . Using ultrasound guidance, access into the LEFT radial artery was obtained with visualization of needle entry into the vessel using standard micropuncture technique. A 5 French slender sheath was placed without complication. A standard radial cocktail was slowly administered. A 5 Fr Terumo Ravi MG-2 catheter and a Bentson wire were directed under direct fluoroscopic guidance through the LEFT upper extremity and to the LEFT subclavian artery and aortic arch. Secondary to tortuosity, there was difficulty in cannulating the descending thoracic aorta. A thoracic aortogram was performed to evaluate the anatomy. The catheter was used to select the LEFT internal iliac artery then cone beam CT angiogram was performed. There was significant tortuosity of the vessels within the within the LEFT pelvis and the LEFT prostatic artery could not be cannulated despite considerable effort. Embolization was not  performed. The 5 Fr catheter was retracted then inserted into the contralateral/RIGHT common iliac artery and subsequently the internal  iliac artery. Cone beam CT angiogram was performed, with post processing performed at a separate workstation. Additionally, selective RIGHT prostatic arteriogram was performed followed by catheterization with a 2.0 Fr, 175 cm [employer] TruSelect cm and 0.014 inch synchro soft micro guide wire. Access was adequate for embolization. Embolization was performed with 300-500 um embosphere micro particles. Post embolization angiogram confirms stasis of the RIGHT prostatic vascular territory. Coil embolization of the RIGHT prostatic artery origin was also performed. CONE BEAM CT ANGIOGRAPHY: To utilize vascular tracking software, a cone beam CTA was then performed from the RIGHT internal iliac artery, and post processing was performed at a separate workstation. PURPOSE OF THE ARTERIOGRAMS: No previous catheter-directed angiogram was available. Therefore a new complete diagnostic angiography was performed. The decision to proceed with an interventional procedure was made based on this new diagnostic angiogram. At this point, all wires, catheters and sheaths were removed from the patient. A TR band was placed over the LEFT wrist and after insufflation the sheath was removed. The TR band balloon was slightly deflated and then re-inflated to achieve patent hemostasis. The patient tolerated the procedure well without immediate complication was transferred to the recovery area in good condition. FINDINGS: *Tortuous thoracic aorta, with aortogram performed to aid in catheter tracking. *Diminutive, tortuous LEFT prostatic artery with difficulty in cannulation. Embolization was not performed on this side. *Dominant RIGHT prostatic artery with successful microparticle and additional coil embolization of the RIGHT prostatic artery origin. IMPRESSION: 1. Successful RIGHT prostatic artery  microparticle embolization, with additional coil embolization prostatic artery origin, via transradial approach. 2. The diminutive LEFT prostatic artery could not be cannulated despite considerable effort. Embolization was not performed on this side. PLAN: The patient will be seen in the Vascular Interventional Radiology (VIR) for post-procedure follow-up evaluation in 1 month. Thom Hall, MD Vascular and Interventional Radiology Specialists [facility] Radiology Electronically Signed   By: Thom Hall M.D.   On: 11/14/2024 16:38   IR Angiogram Pelvis Selective Or Supraselective Result Date: 11/14/2024 INDICATION: Prostatic artery embolization. Hematuria. 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 EXAM: Title; RIGHT PROSTATE ARTERY EMBOLIZATION Listed procedures: 1. ULTRASOUND-GUIDED LEFT RADIAL ARTERY ACCESS 2. THORACIC AORTOGRAPHY 3. PELVIC ARTERIOGRAPHY 4. CONE BEAM CT ANGIOGRAPHY of the PELVIS 5. PROSTATE ARTERY EMBOLIZATION, RIGHT MEDICATIONS: Rocephin  2 gm IV. The antibiotic was administered within one hour of the procedure 50 mg Benadryl  IV.  30 mg Toradol  IV.  500 mg Robaxin  IV ANESTHESIA/SEDATION: Moderate (conscious) sedation was employed during this procedure. A total of Versed  6 mg and Fentanyl  300 mcg was administered intravenously. Moderate Sedation Time: 130 minutes. The patient's level of consciousness and vital signs were monitored continuously by radiology nursing throughout the procedure under my direct supervision. CONTRAST:  20mL OMNIPAQUE  IOHEXOL  300 MG/ML SOLN, 4mL OMNIPAQUE  IOHEXOL  300 MG/ML SOLN, 70mL OMNIPAQUE  IOHEXOL  300 MG/ML SOLN, 30mL OMNIPAQUE  IOHEXOL  300 MG/ML SOLN FLUOROSCOPY: Radiation Exposure Index and estimated peak skin dose (PSD); Reference air kerma (RAK), 914 mGy. COMPLICATIONS: None immediate. PROCEDURE: Informed consent was obtained from the patient following explanation of the procedure, risks, benefits and alternatives. The patient  understands, agrees and consents for the procedure. All questions were addressed. A time out was performed prior to the initiation of the procedure. Maximal barrier sterile technique utilized including caps, mask, sterile gowns, sterile gloves, large sterile drape, hand hygiene, and sterile prep. RADIAL ACCESS A preliminary ultrasound of the LEFT wrist was performed and demonstrates a patent radial artery. A permanent ultrasound image was recorded. The  radial artery was measured for adequate size and a Barbeau test was performed deemed in the LEFT radial artery appropriate vascular access. The overlying skin was anesthetized with 1% lidocaine . Using ultrasound guidance, access into the LEFT radial artery was obtained with visualization of needle entry into the vessel using standard micropuncture technique. A 5 French slender sheath was placed without complication. A standard radial cocktail was slowly administered. A 5 Fr Terumo Ravi MG-2 catheter and a Bentson wire were directed under direct fluoroscopic guidance through the LEFT upper extremity and to the LEFT subclavian artery and aortic arch. Secondary to tortuosity, there was difficulty in cannulating the descending thoracic aorta. A thoracic aortogram was performed to evaluate the anatomy. The catheter was used to select the LEFT internal iliac artery then cone beam CT angiogram was performed. There was significant tortuosity of the vessels within the within the LEFT pelvis and the LEFT prostatic artery could not be cannulated despite considerable effort. Embolization was not performed. The 5 Fr catheter was retracted then inserted into the contralateral/RIGHT common iliac artery and subsequently the internal iliac artery. Cone beam CT angiogram was performed, with post processing performed at a separate workstation. Additionally, selective RIGHT prostatic arteriogram was performed followed by catheterization with a 2.0 Fr, 175 cm [employer] TruSelect  cm and 0.014 inch synchro soft micro guide wire. Access was adequate for embolization. Embolization was performed with 300-500 um embosphere micro particles. Post embolization angiogram confirms stasis of the RIGHT prostatic vascular territory. Coil embolization of the RIGHT prostatic artery origin was also performed. CONE BEAM CT ANGIOGRAPHY: To utilize vascular tracking software, a cone beam CTA was then performed from the RIGHT internal iliac artery, and post processing was performed at a separate workstation. PURPOSE OF THE ARTERIOGRAMS: No previous catheter-directed angiogram was available. Therefore a new complete diagnostic angiography was performed. The decision to proceed with an interventional procedure was made based on this new diagnostic angiogram. At this point, all wires, catheters and sheaths were removed from the patient. A TR band was placed over the LEFT wrist and after insufflation the sheath was removed. The TR band balloon was slightly deflated and then re-inflated to achieve patent hemostasis. The patient tolerated the procedure well without immediate complication was transferred to the recovery area in good condition. FINDINGS: *Tortuous thoracic aorta, with aortogram performed to aid in catheter tracking. *Diminutive, tortuous LEFT prostatic artery with difficulty in cannulation. Embolization was not performed on this side. *Dominant RIGHT prostatic artery with successful microparticle and additional coil embolization of the RIGHT prostatic artery origin. IMPRESSION: 1. Successful RIGHT prostatic artery microparticle embolization, with additional coil embolization prostatic artery origin, via transradial approach. 2. The diminutive LEFT prostatic artery could not be cannulated despite considerable effort. Embolization was not performed on this side. PLAN: The patient will be seen in the Vascular Interventional Radiology (VIR) for post-procedure follow-up evaluation in 1 month. Thom Hall,  MD Vascular and Interventional Radiology Specialists [facility] Radiology Electronically Signed   By: Thom Hall M.D.   On: 11/14/2024 16:38   IR Angiogram Selective Each Additional Vessel Result Date: 11/14/2024 INDICATION: Prostatic artery embolization. Hematuria. 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 EXAM: Title; RIGHT PROSTATE ARTERY EMBOLIZATION Listed procedures: 1. ULTRASOUND-GUIDED LEFT RADIAL ARTERY ACCESS 2. THORACIC AORTOGRAPHY 3. PELVIC ARTERIOGRAPHY 4. CONE BEAM CT ANGIOGRAPHY of the PELVIS 5. PROSTATE ARTERY EMBOLIZATION, RIGHT MEDICATIONS: Rocephin  2 gm IV. The antibiotic was administered within one hour of the procedure 50 mg Benadryl  IV.  30 mg  Toradol  IV.  500 mg Robaxin  IV ANESTHESIA/SEDATION: Moderate (conscious) sedation was employed during this procedure. A total of Versed  6 mg and Fentanyl  300 mcg was administered intravenously. Moderate Sedation Time: 130 minutes. The patient's level of consciousness and vital signs were monitored continuously by radiology nursing throughout the procedure under my direct supervision. CONTRAST:  20mL OMNIPAQUE  IOHEXOL  300 MG/ML SOLN, 4mL OMNIPAQUE  IOHEXOL  300 MG/ML SOLN, 70mL OMNIPAQUE  IOHEXOL  300 MG/ML SOLN, 30mL OMNIPAQUE  IOHEXOL  300 MG/ML SOLN FLUOROSCOPY: Radiation Exposure Index and estimated peak skin dose (PSD); Reference air kerma (RAK), 914 mGy. COMPLICATIONS: None immediate. PROCEDURE: Informed consent was obtained from the patient following explanation of the procedure, risks, benefits and alternatives. The patient understands, agrees and consents for the procedure. All questions were addressed. A time out was performed prior to the initiation of the procedure. Maximal barrier sterile technique utilized including caps, mask, sterile gowns, sterile gloves, large sterile drape, hand hygiene, and sterile prep. RADIAL ACCESS A preliminary ultrasound of the LEFT wrist was performed and demonstrates a patent radial  artery. A permanent ultrasound image was recorded. The radial artery was measured for adequate size and a Barbeau test was performed deemed in the LEFT radial artery appropriate vascular access. The overlying skin was anesthetized with 1% lidocaine . Using ultrasound guidance, access into the LEFT radial artery was obtained with visualization of needle entry into the vessel using standard micropuncture technique. A 5 French slender sheath was placed without complication. A standard radial cocktail was slowly administered. A 5 Fr Terumo Ravi MG-2 catheter and a Bentson wire were directed under direct fluoroscopic guidance through the LEFT upper extremity and to the LEFT subclavian artery and aortic arch. Secondary to tortuosity, there was difficulty in cannulating the descending thoracic aorta. A thoracic aortogram was performed to evaluate the anatomy. The catheter was used to select the LEFT internal iliac artery then cone beam CT angiogram was performed. There was significant tortuosity of the vessels within the within the LEFT pelvis and the LEFT prostatic artery could not be cannulated despite considerable effort. Embolization was not performed. The 5 Fr catheter was retracted then inserted into the contralateral/RIGHT common iliac artery and subsequently the internal iliac artery. Cone beam CT angiogram was performed, with post processing performed at a separate workstation. Additionally, selective RIGHT prostatic arteriogram was performed followed by catheterization with a 2.0 Fr, 175 cm [employer] TruSelect cm and 0.014 inch synchro soft micro guide wire. Access was adequate for embolization. Embolization was performed with 300-500 um embosphere micro particles. Post embolization angiogram confirms stasis of the RIGHT prostatic vascular territory. Coil embolization of the RIGHT prostatic artery origin was also performed. CONE BEAM CT ANGIOGRAPHY: To utilize vascular tracking software, a cone beam CTA  was then performed from the RIGHT internal iliac artery, and post processing was performed at a separate workstation. PURPOSE OF THE ARTERIOGRAMS: No previous catheter-directed angiogram was available. Therefore a new complete diagnostic angiography was performed. The decision to proceed with an interventional procedure was made based on this new diagnostic angiogram. At this point, all wires, catheters and sheaths were removed from the patient. A TR band was placed over the LEFT wrist and after insufflation the sheath was removed. The TR band balloon was slightly deflated and then re-inflated to achieve patent hemostasis. The patient tolerated the procedure well without immediate complication was transferred to the recovery area in good condition. FINDINGS: *Tortuous thoracic aorta, with aortogram performed to aid in catheter tracking. *Diminutive, tortuous LEFT prostatic artery with difficulty in  cannulation. Embolization was not performed on this side. *Dominant RIGHT prostatic artery with successful microparticle and additional coil embolization of the RIGHT prostatic artery origin. IMPRESSION: 1. Successful RIGHT prostatic artery microparticle embolization, with additional coil embolization prostatic artery origin, via transradial approach. 2. The diminutive LEFT prostatic artery could not be cannulated despite considerable effort. Embolization was not performed on this side. PLAN: The patient will be seen in the Vascular Interventional Radiology (VIR) for post-procedure follow-up evaluation in 1 month. Thom Hall, MD [facility] Radiology Electronically Signed   By: Thom Hall M.D.   On: 11/14/2024 16:38   IR Angiogram Selective Each Additional Vessel Result Date: 11/14/2024 INDICATION: Prostatic artery embolization. Hematuria. 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 EXAM: Title; RIGHT PROSTATE ARTERY EMBOLIZATION Listed  procedures: 1. ULTRASOUND-GUIDED LEFT RADIAL ARTERY ACCESS 2. THORACIC AORTOGRAPHY 3. PELVIC ARTERIOGRAPHY 4. CONE BEAM CT ANGIOGRAPHY of the PELVIS 5. PROSTATE ARTERY EMBOLIZATION, RIGHT MEDICATIONS: Rocephin  2 gm IV. The antibiotic was administered within one hour of the procedure 50 mg Benadryl  IV.  30 mg Toradol  IV.  500 mg Robaxin  IV ANESTHESIA/SEDATION: Moderate (conscious) sedation was employed during this procedure. A total of Versed  6 mg and Fentanyl  300 mcg was administered intravenously. Moderate Sedation Time: 130 minutes. The patient's level of consciousness and vital signs were monitored continuously by radiology nursing throughout the procedure under my direct supervision. CONTRAST:  20mL OMNIPAQUE  IOHEXOL  300 MG/ML SOLN, 4mL OMNIPAQUE  IOHEXOL  300 MG/ML SOLN, 70mL OMNIPAQUE  IOHEXOL  300 MG/ML SOLN, 30mL OMNIPAQUE  IOHEXOL  300 MG/ML SOLN FLUOROSCOPY: Radiation Exposure Index and estimated peak skin dose (PSD); Reference air kerma (RAK), 914 mGy. COMPLICATIONS: None immediate. PROCEDURE: Informed consent was obtained from the patient following explanation of the procedure, risks, benefits and alternatives. The patient understands, agrees and consents for the procedure. All questions were addressed. A time out was performed prior to the initiation of the procedure. Maximal barrier sterile technique utilized including caps, mask, sterile gowns, sterile gloves, large sterile drape, hand hygiene, and sterile prep. RADIAL ACCESS A preliminary ultrasound of the LEFT wrist was performed and demonstrates a patent radial artery. A permanent ultrasound image was recorded. The radial artery was measured for adequate size and a Barbeau test was performed deemed in the LEFT radial artery appropriate vascular access. The overlying skin was anesthetized with 1% lidocaine . Using ultrasound guidance, access into the LEFT radial artery was obtained with visualization of needle entry into the vessel using standard  micropuncture technique. A 5 French slender sheath was placed without complication. A standard radial cocktail was slowly administered. A 5 Fr Terumo Ravi MG-2 catheter and a Bentson wire were directed under direct fluoroscopic guidance through the LEFT upper extremity and to the LEFT subclavian artery and aortic arch. Secondary to tortuosity, there was difficulty in cannulating the descending thoracic aorta. A thoracic aortogram was performed to evaluate the anatomy. The catheter was used to select the LEFT internal iliac artery then cone beam CT angiogram was performed. There was significant tortuosity of the vessels within the within the LEFT pelvis and the LEFT prostatic artery could not be cannulated despite considerable effort. Embolization was not performed. The 5 Fr catheter was retracted then inserted into the contralateral/RIGHT common iliac artery and subsequently the internal iliac artery. Cone beam CT angiogram was performed, with post processing performed at a separate workstation. Additionally, selective RIGHT prostatic arteriogram was performed followed by catheterization with a 2.0 Fr, 175 cm [employer] TruSelect cm and 0.014 inch synchro  soft micro guide wire. Access was adequate for embolization. Embolization was performed with 300-500 um embosphere micro particles. Post embolization angiogram confirms stasis of the RIGHT prostatic vascular territory. Coil embolization of the RIGHT prostatic artery origin was also performed. CONE BEAM CT ANGIOGRAPHY: To utilize vascular tracking software, a cone beam CTA was then performed from the RIGHT internal iliac artery, and post processing was performed at a separate workstation. PURPOSE OF THE ARTERIOGRAMS: No previous catheter-directed angiogram was available. Therefore a new complete diagnostic angiography was performed. The decision to proceed with an interventional procedure was made based on this new diagnostic angiogram. At this point, all  wires, catheters and sheaths were removed from the patient. A TR band was placed over the LEFT wrist and after insufflation the sheath was removed. The TR band balloon was slightly deflated and then re-inflated to achieve patent hemostasis. The patient tolerated the procedure well without immediate complication was transferred to the recovery area in good condition. FINDINGS: *Tortuous thoracic aorta, with aortogram performed to aid in catheter tracking. *Diminutive, tortuous LEFT prostatic artery with difficulty in cannulation. Embolization was not performed on this side. *Dominant RIGHT prostatic artery with successful microparticle and additional coil embolization of the RIGHT prostatic artery origin. IMPRESSION: 1. Successful RIGHT prostatic artery microparticle embolization, with additional coil embolization prostatic artery origin, via transradial approach. 2. The diminutive LEFT prostatic artery could not be cannulated despite considerable effort. Embolization was not performed on this side. PLAN: The patient will be seen in the Vascular Interventional Radiology (VIR) for post-procedure follow-up evaluation in 1 month. Thom Hall, MD [facility] Radiology Electronically Signed   By: Thom Hall M.D.   On: 11/14/2024 16:38   CT ANGIO PELVIS W OR WO CONTRAST Result Date: 11/11/2024 CLINICAL DATA:  pre op eval for possible PAE EXAM: CT ANGIOGRAPHY PELVIS WITH CONTRAST TECHNIQUE: Multidetector CT imaging of the pelvis was performed using the standard protocol following the bolus administration of intravenous contrast. Multiplanar image (3D post-processing) reconstructions and MIPs were obtained to evaluate the pelvic vascular anatomy. RADIATION DOSE REDUCTION: This exam was performed according to the departmental dose-optimization program which includes automated exposure control, adjustment of the mA and/or kV according to patient size and/or use of  iterative reconstruction technique. CONTRAST:  OMNIPAQUE  IOHEXOL  350 MG/ML SOLN COMPARISON:  CT AP, 11/08/2024 and 10/28/2024. FINDINGS: VASCULAR; Pelvis: *Mild burden of distal aortic bifurcation and pelvic arterial atherosclerosis without aneurysmal dilatation *Widely patent aortic bifurcation, hypogastric, external iliac and common femoral arteries. *Internal iliac artery branching with division into the superior gluteal artery and gluteal pudendal trunk bilaterally (Yamaki group A). *Patent BILATERAL prostatic arteries, both seemingly arising from the anterior division of the internal iliac artery/gluteal pudendal trunks (type II). *RIGHT common iliac vein stent, with patency not evaluated given arterial phase study. NONVASCULAR; Urinary Tract: Foley catheter and small focus of non dependent intraluminal gas within the urinary bladder. Small volume thrombus along the LEFT posterior urinary bladder wall versus ureteral jet. No active extravasation. No distal ureteral or urinary bladder distention Bowel: Mild rectal distention with stool, correlate for potential impaction. Imaged portions of distal small bowel and colon are nondilated. Lymphatic: No enlarged abdominal or pelvic lymph nodes. Reproductive: Prostatomegaly, measuring approximately 6.0 x 6.5 x 7.0 cm, with a calculated volume of 137 mL Other:  No pelvic ascites. Musculoskeletal: LEFT hip arthroplasty.  No suspicious bone lesion. IMPRESSION: 1. Small volume urinary bladder thrombus versus ureteral jet. No active contrast extravasation. 2. Marked prostatomegaly,  measuring approximately 137 g in volume. 3. BILATERAL internal iliac artery branching into the superior gluteal artery and gluteal pudendal trunk (group A) 4. Patent BILATERAL prostatic arteries, seemingly arising from the gluteal pudendal trunks (type II). 5. Mild rectal distention with stool. Correlate for potential impaction. Additional incidental, chronic and senescent findings as  above. Electronically Signed   By: Thom Hall M.D.   On: 11/11/2024 07:51   CT RENAL STONE STUDY Result Date: 11/08/2024 EXAM: CT UROGRAM 11/08/2024 02:50:59 AM TECHNIQUE: CT of the abdomen and pelvis was performed without the administration of intravenous contrast as per CT urogram protocol. Multiplanar reformatted images as well as MIP urogram images are provided for review. Automated exposure control, iterative reconstruction, and/or weight based adjustment of the mA/kV was utilized to reduce the radiation dose to as low as reasonably achievable. COMPARISON: 10/28/2024. 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 CLINICAL HISTORY: Abdominal/flank pain, stone suspected. 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 LIVER: The liver is unremarkable. GALLBLADDER AND BILE DUCTS: Status post cholecystectomy. No biliary ductal dilatation. SPLEEN: No acute abnormality. PANCREAS: No acute abnormality. ADRENAL GLANDS: No acute abnormality. KIDNEYS, URETERS AND BLADDER: No stones in the kidneys or ureters. No hydronephrosis. No perinephric or periureteral stranding. Thick walled bladder, suggesting cystitis, with indwelling foley catheter and associated bladder hemorrhage (image 86), improved. Prostatomegaly, indenting the base of the bladder, suggesting BPH. GI AND BOWEL: Stomach demonstrates no acute abnormality. There is no bowel obstruction. Moderate colonic stool burden, suggesting mild constipation. PERITONEUM AND RETROPERITONEUM: No ascites. No free air. VASCULATURE: Aorta is normal in caliber. Atherosclerotic calcifications of the abdominal aorta and branch vessels. Right external iliac stent. LYMPH NODES: No lymphadenopathy. REPRODUCTIVE ORGANS: Prostatomegaly, indenting the base of the bladder, suggesting BPH. BONES AND SOFT TISSUES: Left hip hemiarthroplasty with streak artifact across the pelvis. Prior vertebral augmentation at T12. Mild superior endplate  compression fracture deformity at T11 and L3, chronic. No focal soft tissue abnormality. IMPRESSION: 1. Thick-walled bladder, suggesting cystitis. 2. Indwelling Foley catheter and associated bladder hemorrhage, improved. 3. Prostatomegaly, suggesting BPH. Electronically signed by: Pinkie Pebbles MD 11/08/2024 02:55 AM EST RP Workstation: [id number]   CT Renal Stone Study Result Date: 10/28/2024 EXAM: CT ABDOMEN AND PELVIS WITHOUT CONTRAST 10/28/2024 02:37:45 PM TECHNIQUE: CT of the abdomen and pelvis was performed without the administration of intravenous contrast. Multiplanar reformatted images are provided for review. Automated exposure control, iterative reconstruction, and/or weight-based adjustment of the mA/kV was utilized to reduce the radiation dose to as low as reasonably achievable. COMPARISON: CT dated 11/14/2018. CLINICAL HISTORY: Abdominal/flank pain, stone suspected; Gross hematuria. 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 Calcified pleural plaque along the right anterior lower chest wall. LIVER: The liver is unremarkable. GALLBLADDER AND BILE DUCTS: Status post cholecystectomy. No biliary ductal dilatation. SPLEEN: No acute abnormality. PANCREAS: No acute abnormality. ADRENAL GLANDS: No acute abnormality. KIDNEYS, URETERS AND BLADDER: No stones in the kidneys or ureters. No hydronephrosis. No perinephric or periureteral stranding. Indwelling foley catheter with extensive hyperdense material around the foley likely representing blood products within the bladder. GI AND BOWEL: Stomach demonstrates no acute abnormality. There is no bowel obstruction. PERITONEUM AND RETROPERITONEUM: No ascites. No free air. VASCULATURE: Aorta is normal in caliber. Prior right iliac vein stenting. LYMPH NODES: No lymphadenopathy. REPRODUCTIVE ORGANS: The pelvis is poorly visualized due to streak artifact from adjacent hip arthroplasty hardware but there is likely prostatomegaly.  BONES AND SOFT TISSUES: Left hip arthroplasty hardware with adjacent  streak artifact, limiting evaluation somewhat. No acute osseous abnormality. No focal soft tissue abnormality. IMPRESSION: 1. Indwelling Foley catheter with extensive hyperdense material in the bladder, likely blood products. Cystoscopy may be helpful to exclude underlying mass. 2. Probable prostatomegaly, assessment limited by streak artifact from left hip arthroplasty hardware. Electronically signed by: Michaeline Blanch MD 10/28/2024 02:54 PM EDT RP Workstation: [id number]    Microbiology: No results found for this or any previous visit (from the past 240 hours).   Labs: Basic Metabolic Panel: Recent Labs  Lab 11/13/24 0526 11/14/24 0853 11/15/24 1027 11/16/24 0547 11/17/24 0521  NA 140 139 137 135 134*  K 4.9 4.4 3.9 4.0 4.0  CL 106 104 103 102 99  CO2 27 25 25 26 27   GLUCOSE 96 140* 132* 102* 97  BUN 16 17 19 17 15   CREATININE 1.20 1.40* 1.41* 1.43* 1.35*  CALCIUM 9.2 9.4 9.2 9.2 9.3  MG  --   --  2.3  --   --    Liver Function Tests: Recent Labs  Lab 11/13/24 0526  AST 13*  ALT <5  ALKPHOS 96  BILITOT 0.5  PROT 5.7*  ALBUMIN 3.2*   No results for input(s): LIPASE, AMYLASE in the last 168 hours. No results for input(s): AMMONIA in the last 168 hours. CBC: Recent Labs  Lab 11/13/24 0526 11/14/24 0853 11/15/24 1200 11/16/24 0547 11/17/24 0521  WBC 9.9 9.4 12.8* 12.8* 7.9  NEUTROABS  --   --  10.3* 9.6* 5.5  HGB 9.3* 9.6* 8.8* 8.5* 8.7*  HCT 29.2* 30.1* 27.4* 26.6* 27.7*  MCV 97.0 97.1 96.1 96.7 96.2  PLT 106* 103* 98* 95* 100*   Cardiac Enzymes: No results for input(s): CKTOTAL, CKMB, CKMBINDEX, TROPONINI in the last 168 hours. BNP: BNP (last 3 results) No results for input(s): BNP in the last 8760 hours.  ProBNP (last 3 results) No results for input(s): PROBNP in the last 8760 hours.  CBG: No results for input(s): GLUCAP in the last 168  hours.     Signed:  Toribio Hummer MD.  [facility] 11/17/2024, 4:16 PM

## 2024-11-17 NOTE — Telephone Encounter (Signed)
 Called pt to schedule him a nurse visit [place] pt confirmed date of 12/03 @ 9 AM

## 2024-11-17 NOTE — Plan of Care (Signed)
 Patient stayed again overnight.  Urine clear this morning, now for greater than 24 hours.  Clear for discharge.  Nursing notified to provide leg bag prior to discharge.

## 2024-11-17 NOTE — Telephone Encounter (Signed)
-----   Message from Donnice Brooks sent at 11/16/2024  1:40 PM EST ----- Travis Woodward's being discharged from hospital today or tomorrow. He needs a nurse visit void trial one morning week of Dec 1st. Thank you.

## 2024-11-17 NOTE — TOC Transition Note (Signed)
 Transition of Care [facility]) - Discharge Note   Patient Details  Name: Travis Woodward. MRN: [id number] Date of Birth: [birth year]  Transition of Care [facility]) CM/SW Contact:  Bascom Service, RN Phone Number: 11/17/2024, 4:16 PM   Clinical Narrative:d/c home.       Final next level of care: Home/Self Care Barriers to Discharge: No Barriers Identified   Patient Goals and CMS Choice Patient states their goals for this hospitalization and ongoing recovery are:: Home CMS Medicare.gov Compare Post Acute Care list provided to:: Patient Choice offered to / list presented to : Patient [place] ownership interest in [facility].provided to:: Patient    Discharge Placement                       Discharge Plan and Services Additional resources added to the After Visit Summary for                                       Social Drivers of Health (SDOH) Interventions SDOH Screenings   Food Insecurity: No Food Insecurity (11/08/2024)  Housing: Low Risk  (11/08/2024)  Transportation Needs: No Transportation Needs (11/08/2024)  Utilities: Not At Risk (11/08/2024)  Social Connections: Moderately Isolated (11/08/2024)  Tobacco Use: Medium Risk (11/08/2024)     Readmission Risk Interventions    08/01/2023   12:59 PM  Readmission Risk Prevention Plan  Post Dischage Appt Complete  Medication Screening Complete  Transportation Screening Complete

## 2024-11-17 NOTE — TOC Initial Note (Signed)
 Transition of Care (TOC) - Initial/Assessment Note    Patient Details  Name: Travis Woodward. MRN: [id number] Date of Birth: 08-Apr-1941  Transition of Care [facility]) CM/SW Contact:    Bascom Service, RN Phone Number: 11/17/2024, 4:15 PM  Clinical Narrative:                 D/c plan home.  Expected Discharge Plan: Home/Self Care Barriers to Discharge: Continued Medical Work up   Patient Goals and CMS Choice Patient states their goals for this hospitalization and ongoing recovery are:: Home CMS Medicare.gov Compare Post Acute Care list provided to:: Patient Choice offered to / list presented to : Patient [place] ownership interest in [facility].provided to:: Patient    Expected Discharge Plan and Services         Expected Discharge Date: 11/17/24                                    Prior Living Arrangements/Services                       Activities of Daily Living   ADL Screening (condition at time of admission) Independently performs ADLs?: Yes (appropriate for developmental age) Is the patient deaf or have difficulty hearing?: Yes Does the patient have difficulty seeing, even when wearing glasses/contacts?: No Does the patient have difficulty concentrating, remembering, or making decisions?: No  Permission Sought/Granted                  Emotional Assessment              Admission diagnosis:  Gross hematuria [R31.0] Hematuria [R31.9] Chest pain [R07.9] Hematuria of unknown cause [R31.9] Patient Active Problem List   Diagnosis Date Noted   Benign prostatic hyperplasia with lower urinary tract symptoms 11/15/2024   Stage 3a chronic kidney disease (HCC) 11/15/2024   History of DVT (deep vein thrombosis) 11/15/2024   Constipation 11/15/2024   Other chronic pain 11/15/2024   Hematuria of unknown cause 11/08/2024   Hematuria 10/28/2024   Extensive Bil DVT (deep venous thrombosis) 10/13/2023   COVID-19 07/30/2023    Palpitations 03/06/2020   Osteoporosis 10/04/2019   Hypercortisolemia 10/04/2019   Closed transcervical fracture of left femur (HCC) 06/28/2019   Hyperlipidemia 06/28/2019   Anemia 06/28/2019   Hypertension    Calculus of gallbladder without cholecystitis without obstruction    Thrombocytopenia 07/16/2015   History of appendicitis 07/16/2015   H/O ischemic heart disease 07/16/2015   H/O normocytic normochromic anemia 07/16/2015   Anxiety about health 07/16/2015   B12 deficiency 07/16/2015   CAD S/P percutaneous coronary angioplasty--09/03/2004 01/02/2015   Subcutaneous emphysema 06/25/2014   Subarachnoid hemorrhage (HCC) 06/25/2014   Pulmonary contusion 06/25/2014   IVH (intraventricular hemorrhage) (HCC) 06/25/2014   Trauma 06/25/2014   Intracranial hemorrhage (HCC) 06/25/2014   Brain injury (HCC) 06/25/2014   Coronary artery disease without angina pectoris 06/25/2014   Hypoxia 06/24/2014   GERD 06/23/2010   WEIGHT LOSS, ABNORMAL 06/23/2010   NAUSEA, CHRONIC 06/23/2010   VERTEBRAL FRACTURE, THORACIC SPINE 07/16/2008   H N P-LUMBAR 06/25/2008   LOW BACK PAIN 06/25/2008   SCIATICA 06/25/2008   LUMBAR SPASM 06/25/2008   PCP:  Orpha Yancey LABOR, MD Pharmacy:   [facility] - [place], [place] - [address] [place] [address] Phone: [phone] Fax: [phone]  Social Drivers of Health (SDOH) Social History: SDOH Screenings   Food Insecurity: No Food Insecurity (11/08/2024)  Housing: Low Risk  (11/08/2024)  Transportation Needs: No Transportation Needs (11/08/2024)  Utilities: Not At Risk (11/08/2024)  Social Connections: Moderately Isolated (11/08/2024)  Tobacco Use: Medium Risk (11/08/2024)   SDOH Interventions:     Readmission Risk Interventions    08/01/2023   12:59 PM  Readmission Risk Prevention Plan  Post Dischage Appt Complete  Medication Screening Complete  Transportation Screening Complete

## 2024-11-29 ENCOUNTER — Ambulatory Visit

## 2024-11-29 DIAGNOSIS — R339 Retention of urine, unspecified: Secondary | ICD-10-CM | POA: Diagnosis not present

## 2024-11-29 LAB — BLADDER SCAN AMB NON-IMAGING: Scan Result: 21

## 2024-11-29 MED ORDER — CIPROFLOXACIN HCL 500 MG PO TABS
500.0000 mg | ORAL_TABLET | Freq: Once | ORAL | Status: AC
  Administered 2024-11-29: 500 mg via ORAL

## 2024-11-29 NOTE — Progress Notes (Signed)
 Fill and Pull Catheter Removal  Patient is present today for a catheter removal due to Urinary Retention .  350 ml of sterile water  was instilled into the bladder when the patient felt the urge to urinate. 30 ml of water  was then drained from the balloon.  A 22 FR foley cath was removed from the bladder no complications were noted .  Foley catheter intact and time of removal. Patient as then given some time to void on their own.  Patient can void  100 ml on their own after some time.  Patient tolerated well.  One oral prophylactic antibiotic given per MD orders  Performed by: Carlos, CMA  Follow up/ Additional notes: Return @ 1 pm for PVR   Bladder Scan completed today due to reason VT  Patient can void prior to the bladder scan. Bladder scan result: 21  Performed By: Carlos, CMA  Additional notes- Patient is scheduled to follow up with Keep [facility] 02/26/2025

## 2024-12-18 ENCOUNTER — Ambulatory Visit
Admission: RE | Admit: 2024-12-18 | Discharge: 2024-12-18 | Disposition: A | Source: Ambulatory Visit | Attending: Radiology | Admitting: Radiology

## 2024-12-18 ENCOUNTER — Inpatient Hospital Stay: Admission: RE | Admit: 2024-12-18 | Source: Ambulatory Visit

## 2024-12-18 DIAGNOSIS — R310 Gross hematuria: Secondary | ICD-10-CM

## 2024-12-18 HISTORY — PX: IR RADIOLOGIST EVAL & MGMT: IMG5224

## 2024-12-18 NOTE — Progress Notes (Signed)
 "   Reason for visit: Hematuria. BPH with LUTS. s/p Prostate embolization    Care Team(s) Primary Care: Orpha Yancey LABOR, MD Urology: Delia Smalls, NP and Dr. Nieves  Virtual Visit via Video Conferencing  I connected with Glean VEAR Chalmer Mickey. on 12/18/24 by video-telephonic conference and verified that I am speaking with the correct person using two identifiers. I discussed the limitations, risks, security and privacy concerns of performing an evaluation and management service by tele-visit and the availability of in-person appointments. As part of this video-telephonic encounter, no in-person exam was conducted.  History of present illness:  Mr. Ayomikun Starling. 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 Pt also w long standing BPH w LUTS w ER visit in 09/2024 for urinary retention req CIC, complicated by hematuria as he was on AC and DAPT for his DVT. He underwent cystoscopy w urology at [facility] (10/29/24) w intravesicular clots noted and readmitted for foley catheter obstruction. He was transferred to [facility] for definitive management and CBI initiated. VIR consulted and Pt underwent R prostate artery embolization w me on 11/13/24.   His hematuria successfuly decreased, CBI discontinued and he was discharged. Post procedure, he noticed expected swelling and catheter-related discomfort but did well otherwise. He has noticed that his urinary symptoms have greatly improved. He remains on medical management with tamsulosin  0.4 mg and finasteride  5 mg QD. He denies any hematuria recurrence, despite resuming his anticoagulation and his catheter was removed 11/28/24. ROS positive for nocturia up to 3X/night.  Review of Systems: A 12 point ROS discussed and pertinent positives are indicated in the HPI above.  All other systems are negative.   I used a International Prostatism Symptom Score* (IPSS) Score to quantify his symptoms: 9 pts  (moderate). No pre PAE IPSS comparison.    Judyann MJ, Ashley GARNELL Mickey Valorie MP, et al. The American Urological Association symptom index for benign prostatic hyperplasia. The Measurement Committee of the American Urological Association. J Urol [id number]; 851:8450.   Past Medical History:  Diagnosis Date   Chronic kidney disease    stage 3   Colon polyps    Complication of anesthesia    difficulty urinating    Coronary artery disease    Enlarged prostate    Hyperlipidemia    Hypertension     Past Surgical History:  Procedure Laterality Date   acd fusion & plating     BACK SURGERY     x 6   CARDIAC CATHETERIZATION  2005   CHOLECYSTECTOMY N/A 07/19/2017   Procedure: LAPAROSCOPIC CHOLECYSTECTOMY;  Surgeon: Mavis Anes, MD;  Location: [facility];  Service: General;  Laterality: N/A;   CORONARY STENT PLACEMENT  2005   CYSTOSCOPY WITH FULGERATION N/A 10/29/2024   Procedure: CYSTOSCOPY, WITH BLADDER FULGURATION,URETHRAL DILITATION;  Surgeon: Georganne Penne SAUNDERS, MD;  Location: [facility];  Service: Urology;  Laterality: N/A;   HEMORRHOID SURGERY     HIP ARTHROPLASTY Left 06/29/2019   Procedure: ARTHROPLASTY BIPOLAR HIP (HEMIARTHROPLASTY);  Surgeon: Josefina Chew, MD;  Location: [facility];  Service: Orthopedics;  Laterality: Left;   IR 3D INDEPENDENT WKST  11/13/2024   IR ANGIOGRAM PELVIS SELECTIVE OR SUPRASELECTIVE  11/13/2024   IR ANGIOGRAM SELECTIVE EACH ADDITIONAL VESSEL  11/13/2024   IR ANGIOGRAM SELECTIVE EACH ADDITIONAL VESSEL  11/13/2024   IR AORTA/THORACIC  11/13/2024   IR EMBO ART  VEN HEMORR LYMPH EXTRAV  INC GUIDE ROADMAPPING  11/13/2024  IR RADIOLOGIST EVAL & MGMT  12/18/2024   IR US  GUIDE VASC ACCESS LEFT  11/13/2024   IR US  GUIDE VASC ACCESS LEFT  11/13/2024   LAPAROSCOPIC APPENDECTOMY  04/17/2012   Procedure: APPENDECTOMY LAPAROSCOPIC;  Surgeon: Oneil DELENA Budge, MD;  Location: [facility];  Service: General;  Laterality: N/A;   PERIPHERAL VASCULAR INTERVENTION  10/14/2023   Procedure:  PERIPHERAL VASCULAR INTERVENTION;  Surgeon: Pearline Norman RAMAN, MD;  Location: [facility] INVASIVE CV LAB;  Service: Cardiovascular;;   PERIPHERAL VASCULAR THROMBECTOMY Right 10/14/2023   Procedure: PERIPHERAL VASCULAR THROMBECTOMY;  Surgeon: Pearline Norman RAMAN, MD;  Location: [facility] INVASIVE CV LAB;  Service: Cardiovascular;  Laterality: Right;   PERIPHERAL VASCULAR ULTRASOUND/IVUS Right 10/14/2023   Procedure: Peripheral Vascular Ultrasound/IVUS;  Surgeon: Pearline Norman RAMAN, MD;  Location: [facility] INVASIVE CV LAB;  Service: Cardiovascular;  Laterality: Right;   TONSILLECTOMY      Allergies: Crestor [rosuvastatin] and Paroxetine  Medications: Prior to Admission medications  Medication Sig Start Date End Date Taking? Authorizing Provider  acetaminophen  (TYLENOL ) 500 MG tablet Take 1 tablet (500 mg total) by mouth every 6 (six) hours as needed for mild pain (pain score 1-3), fever or headache. 11/17/24   Sebastian Toribio GAILS, MD  apixaban  (ELIQUIS ) 5 MG TABS tablet Take 1 tablet (5 mg total) by mouth 2 (two) times daily. 10/31/24   Sheikh, Omair Latif, DO  [Paused] aspirin  EC 81 MG tablet Take 81 mg by mouth daily. Wait to take this until your doctor or other care provider tells you to start again.    [provider]  Cholecalciferol  (VITAMIN D  HIGH POTENCY PO) Take 5,000 Units by mouth daily.    [provider]  Cyanocobalamin (VITAMIN B-12 IJ) Inject 1 mL as directed every 30 (thirty) days.     [provider]  ferrous sulfate  325 (65 FE) MG tablet Take 1 tablet (325 mg total) by mouth daily with breakfast. 11/18/24   Sebastian Toribio GAILS, MD  finasteride  (PROSCAR ) 5 MG tablet Take 1 tablet (5 mg total) by mouth daily. 10/30/24   Nieves Cough, MD  folic acid  (FOLVITE ) 1 MG tablet Take 1 tablet (1 mg total) by mouth daily. 11/18/24   Sebastian Toribio GAILS, MD  hyoscyamine  (ANASPAZ ) 0.125 MG TBDP disintergrating tablet Take 1 tablet (0.125 mg total) by mouth every 4 (four) hours as needed for  bladder spasms or cramping. 11/17/24   Sebastian Toribio GAILS, MD  liver oil-zinc  oxide (DESITIN) 40 % ointment Apply topically 2 (two) times daily. Apply to buttock; gluteal fold, and perineal areas 11/17/24   Sebastian Toribio GAILS, MD  nitroGLYCERIN  (NITROSTAT ) 0.4 MG SL tablet Place 0.4 mg under the tongue every 5 (five) minutes as needed for chest pain. 06/23/17   [provider]  ondansetron  (ZOFRAN ) 4 MG tablet Take 1 tablet (4 mg total) by mouth every 6 (six) hours as needed for nausea. 10/31/24   Sherrill Cable Latif, DO  oxyCODONE  (ROXICODONE ) 15 MG immediate release tablet Take 15 mg by mouth every 8 (eight) hours as needed for pain. 10/03/24   [provider]  polyethylene glycol (MIRALAX  / GLYCOLAX ) 17 g packet Take 17 g by mouth 2 (two) times daily. 11/17/24   Sebastian Toribio GAILS, MD  senna-docusate (SENOKOT-S) 8.6-50 MG tablet Take 1 tablet by mouth 2 (two) times daily. 11/17/24   Sebastian Toribio GAILS, MD  [Paused] sulfamethoxazole -trimethoprim  (BACTRIM  DS) 800-160 MG tablet Take 1 tablet by mouth 2 (two) times daily. Wait to take this until your doctor  or other care provider tells you to start again. 11/05/24   [provider]  tamsulosin  (FLOMAX ) 0.4 MG CAPS capsule Take 1 capsule (0.4 mg total) by mouth daily. 10/30/24   Nieves Cough, MD  trimethoprim  (TRIMPEX ) 100 MG tablet Take 1 tablet (100 mg total) by mouth at bedtime. 11/16/24   Nieves Cough, MD     Family History  Problem Relation Age of Onset   Heart attack Mother    Tuberculosis Maternal Grandfather    Adrenal disorder Neg Hx    Colon cancer Neg Hx    Stomach cancer Neg Hx    Pancreatic cancer Neg Hx    Esophageal cancer Neg Hx     Social History   Socioeconomic History   Marital status: Married    Spouse name: Not on file   Number of children: 2   Years of education: Not on file   Highest education level: Not on file  Occupational History   Occupation: retired  Tobacco Use   Smoking  status: Former    Current packs/day: 0.00    Types: Cigarettes    Quit date: 07/16/1972    Years since quitting: 52.4    Passive exposure: Past   Smokeless tobacco: Never  Vaping Use   Vaping status: Never Used  Substance and Sexual Activity   Alcohol use: No   Drug use: No   Sexual activity: Never  Other Topics Concern   Not on file  Social History Narrative   Not on file   Social Drivers of Health   Tobacco Use: Medium Risk (11/08/2024)   Patient History    Smoking Tobacco Use: Former    Smokeless Tobacco Use: Never    Passive Exposure: Past  Physicist, Medical Strain: Not on file  Food Insecurity: No Food Insecurity (11/08/2024)   Epic    Worried About Programme Researcher, Broadcasting/film/video in the Last Year: Never true    Ran Out of Food in the Last Year: Never true  Transportation Needs: No Transportation Needs (11/08/2024)   Epic    Lack of Transportation (Medical): No    Lack of Transportation (Non-Medical): No  Physical Activity: Not on file  Stress: Not on file  Social Connections: Moderately Isolated (11/08/2024)   Social Connection and Isolation Panel    Frequency of Communication with Friends and Family: More than three times a week    Frequency of Social Gatherings with Friends and Family: More than three times a week    Attends Religious Services: Never    Database Administrator or Organizations: No    Attends Banker Meetings: Never    Marital Status: Married  Depression (PHQ2-9): Not on file  Alcohol Screen: Not on file  Housing: Low Risk (11/08/2024)   Epic    Unable to Pay for Housing in the Last Year: No    Number of Times Moved in the Last Year: 0    Homeless in the Last Year: No  Utilities: Not At Risk (11/08/2024)   Epic    Threatened with loss of utilities: No  Health Literacy: Not on file    Vital Signs: There were no vitals taken for this visit.  Physical Exam Deferred secondary to virtual visit.  Imaging:     IR Prostate Embo,  11/13/24 IMPRESSION:  1. Successful RIGHT prostatic artery microparticle embolization, with additional coil embolization prostatic artery origin, via transradial approach.   2. The diminutive LEFT prostatic artery could not be cannulated despite considerable  effort. Embolization was not performed on this side.  No results found.  Labs:  CBC: Recent Labs    11/14/24 0853 11/15/24 1200 11/16/24 0547 11/17/24 0521  WBC 9.4 12.8* 12.8* 7.9  HGB 9.6* 8.8* 8.5* 8.7*  HCT 30.1* 27.4* 26.6* 27.7*  PLT 103* 98* 95* 100*    COAGS: Recent Labs    10/28/24 1533 10/29/24 0513 11/08/24 0228 11/13/24 0526  INR 1.2 1.2 1.1 1.2  APTT  --  33  --   --     BMP: Recent Labs    11/14/24 0853 11/15/24 1027 11/16/24 0547 11/17/24 0521  NA 139 137 135 134*  K 4.4 3.9 4.0 4.0  CL 104 103 102 99  CO2 25 25 26 27   GLUCOSE 140* 132* 102* 97  BUN 17 19 17 15   CALCIUM 9.4 9.2 9.2 9.3  CREATININE 1.40* 1.41* 1.43* 1.35*  GFRNONAA 50* 49* 49* 52*    Latest Reference Range & Units 11/11/24 05:24  Prostatic Specific Antigen 0.00 - 4.00 ng/mL 18.91 (H)  (H): Data is abnormally high   Assessment and Plan:  83 y/o M comorbid w PMHx significant for CAD and RLE DVT s/p venous thrombectomy (10/14/23) and on AC and DAPT. Pt w long standing BPH w LUTS w ER visit in 09/2024 for urinary retention req CIC, complicated by hematuria.  Now s/p R PAE 11/13/24. Nocturia decreased to 3X / night.   Prostatomegaly (137 mL) PSA, last 18.9 (11/11/24) IPSS Score Total, 8 Pts, pre PAE score not obtained QoL, 1 markedly improved. Pleased. Remains on tamsulosin  0.4 mg and finasteride  5 mg QD.   *Pt is happy with the prostate embolization procedural result.  *No concern at this time. Follow up with VIR PRN. *Continue routine Urological follow up   Thank you for allowing us  to participate in the care of this Patient. Please contact me with questions, concerns, or if new issues arise.  Electronically  Signed:  Thom Hall, MD [facility]   Pager. [phone] Clinic. [phone]  I spent a total of 25 Minutes of non-face-to-face time in clinical consultation, greater than 50% of which was counseling/coordinating care for Mr ALANSON HAUSMANN Jr.'s evaluation for hematuria.  The patient was physically located in [place]  or a state in which I am permitted to provide care. The encounter was reasonable and appropriate under the circumstances given the patient's presentation at the time.   "

## 2025-02-26 ENCOUNTER — Ambulatory Visit: Admitting: Urology
